# Patient Record
Sex: Male | Born: 1940 | Race: White | Hispanic: No | Marital: Single | Smoking: Never smoker
Health system: Southern US, Community
[De-identification: ages and names within clinical notes are randomized; demographics above are authoritative.]

## PROBLEM LIST (undated history)

## (undated) DIAGNOSIS — I2721 Secondary pulmonary arterial hypertension: Secondary | ICD-10-CM

## (undated) DIAGNOSIS — I10 Essential (primary) hypertension: Secondary | ICD-10-CM

## (undated) DIAGNOSIS — I4821 Permanent atrial fibrillation: Secondary | ICD-10-CM

## (undated) DIAGNOSIS — I509 Heart failure, unspecified: Secondary | ICD-10-CM

## (undated) DIAGNOSIS — I2510 Atherosclerotic heart disease of native coronary artery without angina pectoris: Secondary | ICD-10-CM

## (undated) DIAGNOSIS — I5030 Unspecified diastolic (congestive) heart failure: Secondary | ICD-10-CM

## (undated) DIAGNOSIS — J90 Pleural effusion, not elsewhere classified: Secondary | ICD-10-CM

## (undated) DIAGNOSIS — I4891 Unspecified atrial fibrillation: Secondary | ICD-10-CM

## (undated) DIAGNOSIS — E785 Hyperlipidemia, unspecified: Secondary | ICD-10-CM

## (undated) HISTORY — DX: Heart failure, unspecified: I50.9

## (undated) HISTORY — DX: Permanent atrial fibrillation: I48.21

## (undated) HISTORY — DX: Hyperlipidemia, unspecified: E78.5

## (undated) HISTORY — PX: CARDIAC CATHETERIZATION: SHX172

## (undated) HISTORY — DX: Unspecified diastolic (congestive) heart failure: I50.30

## (undated) HISTORY — PX: CHOLECYSTECTOMY: SHX55

## (undated) HISTORY — DX: Secondary pulmonary arterial hypertension: I27.21

## (undated) HISTORY — DX: Pleural effusion, not elsewhere classified: J90

## (undated) HISTORY — DX: Atherosclerotic heart disease of native coronary artery without angina pectoris: I25.10

## (undated) HISTORY — DX: Unspecified atrial fibrillation: I48.91

## (undated) HISTORY — DX: Essential (primary) hypertension: I10

---

## 2004-04-24 HISTORY — PX: CORONARY ARTERY BYPASS GRAFT: SHX141

## 2004-06-10 HISTORY — PX: CORONARY ANGIOPLASTY: SHX604

## 2013-06-06 ENCOUNTER — Inpatient Hospital Stay: Payer: Self-pay | Admitting: Specialist

## 2013-06-06 DIAGNOSIS — Z8249 Family history of ischemic heart disease and other diseases of the circulatory system: Secondary | ICD-10-CM | POA: Diagnosis not present

## 2013-06-06 DIAGNOSIS — Z951 Presence of aortocoronary bypass graft: Secondary | ICD-10-CM | POA: Diagnosis not present

## 2013-06-06 DIAGNOSIS — I10 Essential (primary) hypertension: Secondary | ICD-10-CM | POA: Diagnosis present

## 2013-06-06 DIAGNOSIS — D72829 Elevated white blood cell count, unspecified: Secondary | ICD-10-CM | POA: Diagnosis not present

## 2013-06-06 DIAGNOSIS — K8021 Calculus of gallbladder without cholecystitis with obstruction: Secondary | ICD-10-CM | POA: Diagnosis not present

## 2013-06-06 DIAGNOSIS — I4891 Unspecified atrial fibrillation: Secondary | ICD-10-CM | POA: Diagnosis not present

## 2013-06-06 DIAGNOSIS — Z882 Allergy status to sulfonamides status: Secondary | ICD-10-CM | POA: Diagnosis not present

## 2013-06-06 DIAGNOSIS — R7989 Other specified abnormal findings of blood chemistry: Secondary | ICD-10-CM | POA: Diagnosis not present

## 2013-06-06 DIAGNOSIS — Z808 Family history of malignant neoplasm of other organs or systems: Secondary | ICD-10-CM | POA: Diagnosis not present

## 2013-06-06 DIAGNOSIS — E876 Hypokalemia: Secondary | ICD-10-CM | POA: Diagnosis present

## 2013-06-06 DIAGNOSIS — I2510 Atherosclerotic heart disease of native coronary artery without angina pectoris: Secondary | ICD-10-CM | POA: Diagnosis not present

## 2013-06-06 DIAGNOSIS — E785 Hyperlipidemia, unspecified: Secondary | ICD-10-CM | POA: Diagnosis present

## 2013-06-06 DIAGNOSIS — K8020 Calculus of gallbladder without cholecystitis without obstruction: Secondary | ICD-10-CM | POA: Diagnosis not present

## 2013-06-06 DIAGNOSIS — I369 Nonrheumatic tricuspid valve disorder, unspecified: Secondary | ICD-10-CM

## 2013-06-06 DIAGNOSIS — R0602 Shortness of breath: Secondary | ICD-10-CM | POA: Diagnosis not present

## 2013-06-06 DIAGNOSIS — K810 Acute cholecystitis: Secondary | ICD-10-CM | POA: Diagnosis not present

## 2013-06-06 DIAGNOSIS — R079 Chest pain, unspecified: Secondary | ICD-10-CM | POA: Diagnosis not present

## 2013-06-06 DIAGNOSIS — I200 Unstable angina: Secondary | ICD-10-CM | POA: Diagnosis not present

## 2013-06-06 DIAGNOSIS — R0789 Other chest pain: Secondary | ICD-10-CM | POA: Diagnosis not present

## 2013-06-06 DIAGNOSIS — J189 Pneumonia, unspecified organism: Secondary | ICD-10-CM | POA: Diagnosis not present

## 2013-06-06 DIAGNOSIS — Z9861 Coronary angioplasty status: Secondary | ICD-10-CM | POA: Diagnosis not present

## 2013-06-06 DIAGNOSIS — I259 Chronic ischemic heart disease, unspecified: Secondary | ICD-10-CM | POA: Diagnosis not present

## 2013-06-06 DIAGNOSIS — R509 Fever, unspecified: Secondary | ICD-10-CM | POA: Diagnosis not present

## 2013-06-06 DIAGNOSIS — B351 Tinea unguium: Secondary | ICD-10-CM | POA: Diagnosis present

## 2013-06-06 DIAGNOSIS — K8050 Calculus of bile duct without cholangitis or cholecystitis without obstruction: Secondary | ICD-10-CM | POA: Diagnosis not present

## 2013-06-06 DIAGNOSIS — K859 Acute pancreatitis without necrosis or infection, unspecified: Secondary | ICD-10-CM | POA: Diagnosis not present

## 2013-06-06 DIAGNOSIS — R109 Unspecified abdominal pain: Secondary | ICD-10-CM | POA: Diagnosis not present

## 2013-06-06 DIAGNOSIS — R1011 Right upper quadrant pain: Secondary | ICD-10-CM | POA: Diagnosis not present

## 2013-06-06 LAB — CBC
HCT: 45.1 % (ref 40.0–52.0)
HGB: 15.7 g/dL (ref 13.0–18.0)
MCH: 34.5 pg — ABNORMAL HIGH (ref 26.0–34.0)
MCV: 100 fL (ref 80–100)
Platelet: 161 10*3/uL (ref 150–440)
RBC: 4.54 10*6/uL (ref 4.40–5.90)
RDW: 13.8 % (ref 11.5–14.5)
WBC: 25.6 10*3/uL — ABNORMAL HIGH (ref 3.8–10.6)

## 2013-06-06 LAB — URINALYSIS, COMPLETE
Blood: NEGATIVE
Hyaline Cast: 3
Leukocyte Esterase: NEGATIVE
Nitrite: NEGATIVE
Protein: 100
Specific Gravity: 1.021 (ref 1.003–1.030)
Squamous Epithelial: <1
WBC UR: 2 /HPF (ref 0–5)

## 2013-06-06 LAB — CK TOTAL AND CKMB (NOT AT ARMC): CK, Total: 96 U/L (ref 35–232)

## 2013-06-06 LAB — COMPREHENSIVE METABOLIC PANEL
Albumin: 3.6 g/dL (ref 3.4–5.0)
Anion Gap: 7 (ref 7–16)
Bilirubin,Total: 2.7 mg/dL — ABNORMAL HIGH (ref 0.2–1.0)
Calcium, Total: 8.9 mg/dL (ref 8.5–10.1)
Chloride: 98 mmol/L (ref 98–107)
Co2: 30 mmol/L (ref 21–32)
EGFR (African American): >60
SGOT(AST): 79 U/L — ABNORMAL HIGH (ref 15–37)
Total Protein: 7.6 g/dL (ref 6.4–8.2)

## 2013-06-06 LAB — LIPID PANEL
Cholesterol: 150 mg/dL (ref 0–200)
Ldl Cholesterol, Calc: 90 mg/dL (ref 0–100)
Triglycerides: 85 mg/dL (ref 0–200)

## 2013-06-06 LAB — TROPONIN I: Troponin-I: <0.02 ng/mL

## 2013-06-06 LAB — PROTIME-INR
INR: 1
Prothrombin Time: 13.3 secs (ref 11.5–14.7)

## 2013-06-07 LAB — CBC WITH DIFFERENTIAL/PLATELET
Basophil #: 0 10*3/uL (ref 0.0–0.1)
Eosinophil #: 0.1 10*3/uL (ref 0.0–0.7)
Eosinophil %: 0.2 %
HCT: 43.3 % (ref 40.0–52.0)
Lymphocyte #: 1.8 10*3/uL (ref 1.0–3.6)
MCHC: 33.8 g/dL (ref 32.0–36.0)
Monocyte %: 5.3 %
Neutrophil %: 88.1 %
WBC: 29 10*3/uL — ABNORMAL HIGH (ref 3.8–10.6)

## 2013-06-07 LAB — MAGNESIUM: Magnesium: 1.7 mg/dL — ABNORMAL LOW

## 2013-06-07 LAB — COMPREHENSIVE METABOLIC PANEL
BUN: 17 mg/dL (ref 7–18)
Bilirubin,Total: 2.6 mg/dL — ABNORMAL HIGH (ref 0.2–1.0)
Co2: 30 mmol/L (ref 21–32)
EGFR (African American): >60
EGFR (Non-African Amer.): >60
Osmolality: 272 (ref 275–301)
Potassium: 3 mmol/L — ABNORMAL LOW (ref 3.5–5.1)

## 2013-06-07 LAB — CK TOTAL AND CKMB (NOT AT ARMC): CK, Total: 63 U/L (ref 35–232)

## 2013-06-07 LAB — TROPONIN I: Troponin-I: <0.02 ng/mL

## 2013-06-08 DIAGNOSIS — I4891 Unspecified atrial fibrillation: Secondary | ICD-10-CM

## 2013-06-08 LAB — BASIC METABOLIC PANEL
Anion Gap: 8 (ref 7–16)
Calcium, Total: 8.4 mg/dL — ABNORMAL LOW (ref 8.5–10.1)
Chloride: 99 mmol/L (ref 98–107)
Co2: 27 mmol/L (ref 21–32)
Creatinine: 1.12 mg/dL (ref 0.60–1.30)
EGFR (African American): >60
EGFR (Non-African Amer.): >60
Glucose: 145 mg/dL — ABNORMAL HIGH (ref 65–99)
Osmolality: 274 (ref 275–301)
Sodium: 134 mmol/L — ABNORMAL LOW (ref 136–145)

## 2013-06-08 LAB — CBC WITH DIFFERENTIAL/PLATELET
Eosinophil #: 0 10*3/uL (ref 0.0–0.7)
Eosinophil %: 0.1 %
HCT: 38.4 % — ABNORMAL LOW (ref 40.0–52.0)
Lymphocyte #: 1.2 10*3/uL (ref 1.0–3.6)
MCH: 33.9 pg (ref 26.0–34.0)
Monocyte %: 5.7 %
Neutrophil #: 18.4 10*3/uL — ABNORMAL HIGH (ref 1.4–6.5)
Neutrophil %: 88.4 %
RBC: 3.83 10*6/uL — ABNORMAL LOW (ref 4.40–5.90)
WBC: 20.9 10*3/uL — ABNORMAL HIGH (ref 3.8–10.6)

## 2013-06-09 DIAGNOSIS — R079 Chest pain, unspecified: Secondary | ICD-10-CM

## 2013-06-09 LAB — COMPREHENSIVE METABOLIC PANEL
Albumin: 2.6 g/dL — ABNORMAL LOW (ref 3.4–5.0)
Bilirubin,Total: 1.4 mg/dL — ABNORMAL HIGH (ref 0.2–1.0)
Co2: 31 mmol/L (ref 21–32)
EGFR (African American): >60
EGFR (Non-African Amer.): 58 — ABNORMAL LOW
Glucose: 141 mg/dL — ABNORMAL HIGH (ref 65–99)
SGOT(AST): 19 U/L (ref 15–37)
SGPT (ALT): 51 U/L (ref 12–78)
Sodium: 135 mmol/L — ABNORMAL LOW (ref 136–145)
Total Protein: 6.7 g/dL (ref 6.4–8.2)

## 2013-06-09 LAB — CBC WITH DIFFERENTIAL/PLATELET
Basophil #: 0 10*3/uL (ref 0.0–0.1)
Basophil %: 0.1 %
Eosinophil #: 0 10*3/uL (ref 0.0–0.7)
HCT: 37.1 % — ABNORMAL LOW (ref 40.0–52.0)
HGB: 12.8 g/dL — ABNORMAL LOW (ref 13.0–18.0)
MCHC: 34.5 g/dL (ref 32.0–36.0)
MCV: 100 fL (ref 80–100)
Monocyte #: 1.1 x10 3/mm — ABNORMAL HIGH (ref 0.2–1.0)
Monocyte %: 7.2 %
Neutrophil #: 13.3 10*3/uL — ABNORMAL HIGH (ref 1.4–6.5)
RBC: 3.71 10*6/uL — ABNORMAL LOW (ref 4.40–5.90)

## 2013-06-10 LAB — BASIC METABOLIC PANEL
BUN: 19 mg/dL — ABNORMAL HIGH (ref 7–18)
Calcium, Total: 8.8 mg/dL (ref 8.5–10.1)
Co2: 29 mmol/L (ref 21–32)
EGFR (African American): >60
EGFR (Non-African Amer.): 58 — ABNORMAL LOW
Glucose: 129 mg/dL — ABNORMAL HIGH (ref 65–99)
Osmolality: 278 (ref 275–301)
Sodium: 137 mmol/L (ref 136–145)

## 2013-06-10 LAB — CBC WITH DIFFERENTIAL/PLATELET
Basophil #: 0 10*3/uL (ref 0.0–0.1)
Basophil %: 0.2 %
Eosinophil #: 0.1 10*3/uL (ref 0.0–0.7)
HCT: 36.4 % — ABNORMAL LOW (ref 40.0–52.0)
Lymphocyte %: 8.3 %
MCHC: 34.6 g/dL (ref 32.0–36.0)
MCV: 100 fL (ref 80–100)
Monocyte %: 8.1 %
Neutrophil #: 13.5 10*3/uL — ABNORMAL HIGH (ref 1.4–6.5)
Platelet: 148 10*3/uL — ABNORMAL LOW (ref 150–440)
RDW: 13.3 % (ref 11.5–14.5)
WBC: 16.3 10*3/uL — ABNORMAL HIGH (ref 3.8–10.6)

## 2013-06-11 LAB — CBC WITH DIFFERENTIAL/PLATELET
Basophil #: 0 10*3/uL (ref 0.0–0.1)
Eosinophil #: 0.3 10*3/uL (ref 0.0–0.7)
Eosinophil %: 1.5 %
HCT: 35.7 % — ABNORMAL LOW (ref 40.0–52.0)
HGB: 12.3 g/dL — ABNORMAL LOW (ref 13.0–18.0)
MCH: 34.6 pg — ABNORMAL HIGH (ref 26.0–34.0)
MCV: 100 fL (ref 80–100)
Monocyte #: 1.2 x10 3/mm — ABNORMAL HIGH (ref 0.2–1.0)
Monocyte %: 7.3 %
RBC: 3.56 10*6/uL — ABNORMAL LOW (ref 4.40–5.90)

## 2013-06-11 LAB — RAPID HIV-1/2 QL/CONFIRM: HIV-1/2,Rapid Ql: NEGATIVE

## 2013-06-11 LAB — LIPASE, BLOOD: Lipase: 879 U/L — ABNORMAL HIGH (ref 73–393)

## 2013-06-12 LAB — CBC WITH DIFFERENTIAL/PLATELET
Basophil %: 0.1 %
Eosinophil #: 0 10*3/uL (ref 0.0–0.7)
HGB: 13.2 g/dL (ref 13.0–18.0)
Lymphocyte #: 1 10*3/uL (ref 1.0–3.6)
Lymphocyte %: 4.6 %
MCH: 35.3 pg — ABNORMAL HIGH (ref 26.0–34.0)
MCHC: 35.8 g/dL (ref 32.0–36.0)
MCV: 99 fL (ref 80–100)
Neutrophil #: 18.8 10*3/uL — ABNORMAL HIGH (ref 1.4–6.5)
Neutrophil %: 88.5 %
Platelet: 216 10*3/uL (ref 150–440)
RBC: 3.73 10*6/uL — ABNORMAL LOW (ref 4.40–5.90)
WBC: 21.2 10*3/uL — ABNORMAL HIGH (ref 3.8–10.6)

## 2013-06-12 LAB — CULTURE, BLOOD (SINGLE)

## 2013-06-17 ENCOUNTER — Telehealth: Payer: Self-pay

## 2013-06-17 NOTE — Telephone Encounter (Signed)
Patient contacted regarding discharge from [facility] on 06/12/13.  Patient understands to follow up with provider Dr. Mariah Milling on 06/23/13 at 11:15 at [facility]. Patient understands discharge instructions? yes Patient understands medications and regiment? yes Patient understands to bring all medications to this visit? yes

## 2013-06-23 ENCOUNTER — Ambulatory Visit (INDEPENDENT_AMBULATORY_CARE_PROVIDER_SITE_OTHER): Payer: Medicare Other | Admitting: Cardiovascular Disease

## 2013-06-23 ENCOUNTER — Encounter: Payer: Self-pay | Admitting: Cardiovascular Disease

## 2013-06-23 VITALS — BP 90/58 | HR 72 | Ht 71.0 in | Wt 186.5 lb

## 2013-06-23 DIAGNOSIS — I2510 Atherosclerotic heart disease of native coronary artery without angina pectoris: Secondary | ICD-10-CM | POA: Insufficient documentation

## 2013-06-23 DIAGNOSIS — I4891 Unspecified atrial fibrillation: Secondary | ICD-10-CM | POA: Insufficient documentation

## 2013-06-23 DIAGNOSIS — K859 Acute pancreatitis without necrosis or infection, unspecified: Secondary | ICD-10-CM | POA: Diagnosis not present

## 2013-06-23 DIAGNOSIS — E785 Hyperlipidemia, unspecified: Secondary | ICD-10-CM

## 2013-06-23 DIAGNOSIS — Z951 Presence of aortocoronary bypass graft: Secondary | ICD-10-CM | POA: Diagnosis not present

## 2013-06-23 DIAGNOSIS — I10 Essential (primary) hypertension: Secondary | ICD-10-CM | POA: Insufficient documentation

## 2013-06-23 DIAGNOSIS — K851 Biliary acute pancreatitis without necrosis or infection: Secondary | ICD-10-CM | POA: Insufficient documentation

## 2013-06-23 MED ORDER — RIVAROXABAN 20 MG PO TABS: 20.0000 mg | ORAL_TABLET | Freq: Every day | ORAL | Status: DC

## 2013-06-23 NOTE — Assessment & Plan Note (Addendum)
Heart rate is reasonably well controlled. We have suggested he stay on his metoprolol and anticoagulation, xarelto. If he requires laparoscopic cholecystectomy, xarelto could be held 2 or 3 days prior to the surgery with restart of the anticoagulation 48 hours after the procedure. After his procedure and followup, we can discuss whether he would like to proceed with cardioversion for his atrial fibrillation.

## 2013-06-23 NOTE — Assessment & Plan Note (Signed)
Acceptable risk for laparoscopic cholecystectomy. Recent negative stress test. No further testing needed. Would hold anticoagulation 2 or 3 days prior to the procedure, restart 48 hours after the procedure.

## 2013-06-23 NOTE — Patient Instructions (Addendum)
You are doing well. Please hold the HCTZ (hydrocholorthiazide) Take this pill only for leg selling/edema or shortness of breath  Please continue the aspirin 81 mg daily with xarelto one a day  Please call us if you have new issues that need to be addressed before your next appt.  Your physician wants you to follow-up in: 3 months.  You will receive a reminder letter in the mail two months in advance. If you don't receive a letter, please call our office to schedule the follow-up appointment.

## 2013-06-23 NOTE — Progress Notes (Signed)
Patient ID: Jordan Perkins, male    DOB: [birth year], 72 y.o.   MRN: [id number]  HPI Comments: Jordan Perkins is a pleasant 72 year old gentleman with recent hospital admission at the end of September 2014, past medical history of CAD, bypass surgery, stents in 2005, hypertension, hyperlipidemia, atrial fibrillation who presented to the hospital with shortness of breath and chest pain, rapid atrial fibrillation. Heart rate was initially 140s Jordan Perkins had CABG in 2005 at [facility] with stents placed 2 months later  On his recent hospital admission at [facility] Jordan Perkins was noted to have gallstones. Jordan Perkins had a negative Hida scan. Jordan Perkins has followup with general surgery for possible cholecystectomy.  Jordan Perkins had fever of uncertain etiology as high as 102 while in the hospital. Jordan Perkins was initially treated with antibiotics, discharged on Levaquin and Flagyl for possible pneumonia or biliary pathology. White blood cell count was 21,000 His cardiac enzymes were negative through his hospital course. Stress test was performed that showed no ischemia Echocardiogram showed ejection fraction 60-65%, mild to moderate TR, moderately elevated right ventricular systolic pressures Jordan Perkins was started on anticoagulation, xarelto 20 mg daily for his atrial fibrillation. Jordan Perkins was unable to drive frequently and it was felt Jordan Perkins could not make his INR appointments  CT scan in the hospital of his abdomen and pelvis showed acute pancreatitis with increased fluid density in the peri-pancreatic fat, multiple gallstones, small hiatal hernia in followup today, Jordan Perkins feels well with no complaints. Jordan Perkins reports anorexia. Slowly losing weight as Jordan Perkins is not hungry. Vague discomfort in his  Abdomen.  EKG shows atrial fibrillation with rate 72 beats per minute, no significant ST or T wave changes  Blood pressure in the office today is low, 90s systolic. Repeat 100 systolic. Jordan Perkins is relatively asymptomatic   Outpatient Encounter Prescriptions as of 06/23/2013  Medication Sig  Dispense Refill  . aspirin 81 MG tablet Take 81 mg by mouth daily.      Marland Kitchen atorvastatin (LIPITOR) 80 MG tablet Take 80 mg by mouth daily.      . enalapril (VASOTEC) 20 MG tablet Take 20 mg by mouth daily.      . hydrochlorothiazide (HYDRODIURIL) 25 MG tablet Take 25 mg by mouth daily.      . metoprolol (LOPRESSOR) 100 MG tablet Take 100 mg by mouth 2 (two) times daily.      . Rivaroxaban (XARELTO) 20 MG TABS tablet Take 1 tablet (20 mg total) by mouth daily.  30 tablet  6  . [DISCONTINUED] Rivaroxaban (XARELTO) 20 MG TABS tablet Take by mouth daily.       No facility-administered encounter medications on file as of 06/23/2013.     Review of Systems  Constitutional: Negative.   HENT: Negative.   Eyes: Negative.   Respiratory: Negative.   Cardiovascular: Negative.   Gastrointestinal: Negative.   Endocrine: Negative.   Musculoskeletal: Negative.   Skin: Negative.   Allergic/Immunologic: Negative.   Neurological: Negative.   Hematological: Negative.   Psychiatric/Behavioral: Negative.   All other systems reviewed and are negative.    BP 90/58  Pulse 72  Ht 5\' 11"  (1.803 m)  Wt 186 lb 8 oz (84.596 kg)  BMI 26.02 kg/m2 Repeat blood pressure 100 systolic  Physical Exam  Nursing note and vitals reviewed. Constitutional: Jordan Perkins is oriented to person, place, and time. Jordan Perkins appears well-developed and well-nourished.  HENT:  Head: Normocephalic.  Nose: Nose normal.  Mouth/Throat: Oropharynx is clear and moist.  Eyes: Conjunctivae are normal. Pupils are equal, round,  and reactive to light.  Neck: Normal range of motion. Neck supple. No JVD present.  Cardiovascular: Normal rate, regular rhythm, S1 normal, S2 normal, normal heart sounds and intact distal pulses.  Exam reveals no gallop and no friction rub.   No murmur heard. Pulmonary/Chest: Effort normal and breath sounds normal. No respiratory distress. Jordan Perkins has no wheezes. Jordan Perkins has no rales. Jordan Perkins exhibits no tenderness.  Abdominal: Soft.  Bowel sounds are normal. Jordan Perkins exhibits no distension. There is no tenderness.  Musculoskeletal: Normal range of motion. Jordan Perkins exhibits no edema and no tenderness.  Lymphadenopathy:    Jordan Perkins has no cervical adenopathy.  Neurological: Jordan Perkins is alert and oriented to person, place, and time. Coordination normal.  Skin: Skin is warm and dry. No rash noted. No erythema.  Psychiatric: Jordan Perkins has a normal mood and affect. His behavior is normal. Judgment and thought content normal.      Assessment and Plan

## 2013-06-23 NOTE — Assessment & Plan Note (Signed)
We have suggested that he stay on his Lipitor

## 2013-06-23 NOTE — Assessment & Plan Note (Signed)
Blood pressure is low today. We have suggested he hold his HCTZ and take this only as needed for leg edema

## 2013-06-23 NOTE — Assessment & Plan Note (Signed)
Recent stress test on 06/08/2013 showing no ischemia. No further workup needed at this time. We'll continue him on his current medications

## 2013-07-01 ENCOUNTER — Telehealth: Payer: Self-pay

## 2013-07-01 DIAGNOSIS — K859 Acute pancreatitis without necrosis or infection, unspecified: Secondary | ICD-10-CM | POA: Diagnosis not present

## 2013-07-01 DIAGNOSIS — K8020 Calculus of gallbladder without cholecystitis without obstruction: Secondary | ICD-10-CM | POA: Diagnosis not present

## 2013-07-01 NOTE — Telephone Encounter (Signed)
Surgery scheduled for 07/13/2013 for Gallbladder removal. Is it ok to stop Xarelto and how long. Please advise in writing. Fax [phone]

## 2013-07-01 NOTE — Telephone Encounter (Signed)
Will fax last office note w/ cardiac clearance to [facility] at [phone].

## 2013-07-03 ENCOUNTER — Encounter: Payer: Self-pay | Admitting: *Deleted

## 2013-07-07 ENCOUNTER — Ambulatory Visit: Payer: Self-pay | Admitting: Surgery

## 2013-07-07 DIAGNOSIS — Z79899 Other long term (current) drug therapy: Secondary | ICD-10-CM | POA: Diagnosis not present

## 2013-07-07 DIAGNOSIS — Z01812 Encounter for preprocedural laboratory examination: Secondary | ICD-10-CM | POA: Diagnosis not present

## 2013-07-07 DIAGNOSIS — I4891 Unspecified atrial fibrillation: Secondary | ICD-10-CM | POA: Diagnosis not present

## 2013-07-07 DIAGNOSIS — I10 Essential (primary) hypertension: Secondary | ICD-10-CM | POA: Diagnosis not present

## 2013-07-07 DIAGNOSIS — K859 Acute pancreatitis without necrosis or infection, unspecified: Secondary | ICD-10-CM | POA: Diagnosis not present

## 2013-07-07 DIAGNOSIS — Z882 Allergy status to sulfonamides status: Secondary | ICD-10-CM | POA: Diagnosis not present

## 2013-07-07 DIAGNOSIS — K8000 Calculus of gallbladder with acute cholecystitis without obstruction: Secondary | ICD-10-CM | POA: Diagnosis not present

## 2013-07-07 LAB — BASIC METABOLIC PANEL
Anion Gap: 2 — ABNORMAL LOW (ref 7–16)
Calcium, Total: 9.6 mg/dL (ref 8.5–10.1)
Chloride: 105 mmol/L (ref 98–107)
Co2: 29 mmol/L (ref 21–32)
EGFR (African American): >60
EGFR (Non-African Amer.): >60
Osmolality: 275 (ref 275–301)
Sodium: 136 mmol/L (ref 136–145)

## 2013-07-07 LAB — CBC WITH DIFFERENTIAL/PLATELET
Eosinophil #: 0.2 10*3/uL (ref 0.0–0.7)
Eosinophil %: 2.4 %
HCT: 38.5 % — ABNORMAL LOW (ref 40.0–52.0)
HGB: 13.5 g/dL (ref 13.0–18.0)
Lymphocyte #: 1.6 10*3/uL (ref 1.0–3.6)
MCH: 34.4 pg — ABNORMAL HIGH (ref 26.0–34.0)
MCV: 98 fL (ref 80–100)
Monocyte #: 0.5 x10 3/mm (ref 0.2–1.0)
Neutrophil %: 70.1 %
Platelet: 142 10*3/uL — ABNORMAL LOW (ref 150–440)
RDW: 13.7 % (ref 11.5–14.5)

## 2013-07-07 LAB — LIPASE, BLOOD: Lipase: 580 U/L — ABNORMAL HIGH (ref 73–393)

## 2013-07-13 ENCOUNTER — Encounter: Payer: Self-pay | Admitting: Cardiovascular Disease

## 2013-07-13 ENCOUNTER — Ambulatory Visit: Payer: Self-pay | Admitting: Surgery

## 2013-07-13 DIAGNOSIS — I2510 Atherosclerotic heart disease of native coronary artery without angina pectoris: Secondary | ICD-10-CM | POA: Diagnosis not present

## 2013-07-13 DIAGNOSIS — K8010 Calculus of gallbladder with chronic cholecystitis without obstruction: Secondary | ICD-10-CM | POA: Diagnosis not present

## 2013-07-13 DIAGNOSIS — K8000 Calculus of gallbladder with acute cholecystitis without obstruction: Secondary | ICD-10-CM | POA: Diagnosis not present

## 2013-07-13 DIAGNOSIS — I10 Essential (primary) hypertension: Secondary | ICD-10-CM | POA: Diagnosis not present

## 2013-07-13 DIAGNOSIS — Z9861 Coronary angioplasty status: Secondary | ICD-10-CM | POA: Diagnosis not present

## 2013-07-13 DIAGNOSIS — Z882 Allergy status to sulfonamides status: Secondary | ICD-10-CM | POA: Diagnosis not present

## 2013-07-13 DIAGNOSIS — E785 Hyperlipidemia, unspecified: Secondary | ICD-10-CM | POA: Diagnosis not present

## 2013-07-13 DIAGNOSIS — I4892 Unspecified atrial flutter: Secondary | ICD-10-CM | POA: Diagnosis not present

## 2013-07-13 DIAGNOSIS — Z808 Family history of malignant neoplasm of other organs or systems: Secondary | ICD-10-CM | POA: Diagnosis not present

## 2013-07-13 DIAGNOSIS — Z8489 Family history of other specified conditions: Secondary | ICD-10-CM | POA: Diagnosis not present

## 2013-07-13 DIAGNOSIS — I4891 Unspecified atrial fibrillation: Secondary | ICD-10-CM | POA: Diagnosis not present

## 2013-07-13 DIAGNOSIS — Z8249 Family history of ischemic heart disease and other diseases of the circulatory system: Secondary | ICD-10-CM | POA: Diagnosis not present

## 2013-07-13 DIAGNOSIS — K8020 Calculus of gallbladder without cholecystitis without obstruction: Secondary | ICD-10-CM | POA: Diagnosis not present

## 2013-07-13 DIAGNOSIS — Z7902 Long term (current) use of antithrombotics/antiplatelets: Secondary | ICD-10-CM | POA: Diagnosis not present

## 2013-07-13 DIAGNOSIS — K859 Acute pancreatitis without necrosis or infection, unspecified: Secondary | ICD-10-CM | POA: Diagnosis not present

## 2013-07-13 DIAGNOSIS — Z951 Presence of aortocoronary bypass graft: Secondary | ICD-10-CM | POA: Diagnosis not present

## 2013-07-13 DIAGNOSIS — Z79899 Other long term (current) drug therapy: Secondary | ICD-10-CM | POA: Diagnosis not present

## 2013-07-14 LAB — PATHOLOGY REPORT

## 2013-09-23 ENCOUNTER — Ambulatory Visit: Payer: Medicare Other | Admitting: Cardiovascular Disease

## 2013-10-08 ENCOUNTER — Ambulatory Visit: Payer: Medicare Other | Admitting: Cardiovascular Disease

## 2013-10-08 ENCOUNTER — Encounter: Payer: Self-pay | Admitting: *Deleted

## 2013-11-17 ENCOUNTER — Other Ambulatory Visit: Payer: Self-pay | Admitting: *Deleted

## 2013-11-17 ENCOUNTER — Encounter: Payer: Self-pay | Admitting: Cardiovascular Disease

## 2013-11-17 ENCOUNTER — Ambulatory Visit (INDEPENDENT_AMBULATORY_CARE_PROVIDER_SITE_OTHER): Payer: Medicare Other | Admitting: Cardiovascular Disease

## 2013-11-17 VITALS — BP 160/98 | HR 100 | Ht 71.0 in | Wt 199.5 lb

## 2013-11-17 DIAGNOSIS — I4891 Unspecified atrial fibrillation: Secondary | ICD-10-CM

## 2013-11-17 DIAGNOSIS — Z951 Presence of aortocoronary bypass graft: Secondary | ICD-10-CM

## 2013-11-17 DIAGNOSIS — E785 Hyperlipidemia, unspecified: Secondary | ICD-10-CM

## 2013-11-17 DIAGNOSIS — I2510 Atherosclerotic heart disease of native coronary artery without angina pectoris: Secondary | ICD-10-CM | POA: Diagnosis not present

## 2013-11-17 DIAGNOSIS — I10 Essential (primary) hypertension: Secondary | ICD-10-CM

## 2013-11-17 MED ORDER — RIVAROXABAN 20 MG PO TABS: 20.0000 mg | ORAL_TABLET | Freq: Every day | ORAL | Status: DC

## 2013-11-17 MED ORDER — ATORVASTATIN CALCIUM 80 MG PO TABS: 80.0000 mg | ORAL_TABLET | Freq: Every day | ORAL | Status: DC

## 2013-11-17 MED ORDER — ENALAPRIL MALEATE 20 MG PO TABS: 20.0000 mg | ORAL_TABLET | Freq: Every day | ORAL | Status: DC

## 2013-11-17 MED ORDER — HYDROCHLOROTHIAZIDE 25 MG PO TABS: 25.0000 mg | ORAL_TABLET | Freq: Every day | ORAL | Status: DC

## 2013-11-17 MED ORDER — METOPROLOL TARTRATE 100 MG PO TABS: 100.0000 mg | ORAL_TABLET | Freq: Two times a day (BID) | ORAL | Status: DC

## 2013-11-17 NOTE — Telephone Encounter (Signed)
Requested Prescriptions   Signed Prescriptions Disp Refills  . atorvastatin (LIPITOR) 80 MG tablet 30 tablet 3    Sig: Take 1 tablet (80 mg total) by mouth daily.    Authorizing Provider: [name]    Ordering User: Eugenio Hoes, Keyanah Kozicki C  . enalapril (VASOTEC) 20 MG tablet 30 tablet 3    Sig: Take 1 tablet (20 mg total) by mouth daily.    Authorizing Provider: [name]    Ordering User: Britt Bottom hydrochlorothiazide (HYDRODIURIL) 25 MG tablet 30 tablet 3    Sig: Take 1 tablet (25 mg total) by mouth daily.    Authorizing Provider: [name]    Ordering User: Eugenio Hoes, Hai Grabe C  . metoprolol (LOPRESSOR) 100 MG tablet 60 tablet 3    Sig: Take 1 tablet (100 mg total) by mouth 2 (two) times daily.    Authorizing Provider: [name]    Ordering User: Britt Bottom Rivaroxaban (XARELTO) 20 MG TABS tablet 30 tablet 3    Sig: Take 1 tablet (20 mg total) by mouth daily.    Authorizing Provider: [name]    Ordering User: Britt Bottom

## 2013-11-17 NOTE — Progress Notes (Signed)
Patient ID: Jordan Perkins, male    DOB: 12-10-1940, 74 y.o.   MRN: [id number]  HPI Comments: Mr. Hildreth is a pleasant 73 year old gentleman with recent hospital admission at the end of September 2014, past medical history of CAD, bypass surgery, stents in 2005, hypertension, hyperlipidemia, atrial fibrillation who presented to the hospital with shortness of breath and chest pain, rapid atrial fibrillation. Heart rate was initially 140s He had CABG in 2005 at [facility] with stents placed 2 months later   hospital admission at [facility] he was noted to have gallstones. He had a negative Hida scan. Now s/p  Cholecystectomy 11/14 Stress test was performed that showed no ischemia Echocardiogram showed ejection fraction 60-65%, mild to moderate TR, moderately elevated right ventricular systolic pressures He was started on anticoagulation, xarelto 20 mg daily for his atrial fibrillation. He was unable to drive frequently and it was felt he could not make his INR appointments  In followup today, he reports that he is doing well. He denies any chest pain or shortness of breath with exertion. He does not appreciate his atrial fibrillation.  CT scan in the hospital of his abdomen and pelvis showed acute pancreatitis with increased fluid density in the peri-pancreatic fat, multiple gallstones, small hiatal hernia in followup today, he feels well with no complaints. He reports anorexia. Slowly losing weight as he is not hungry. Vague discomfort in his  Abdomen.  EKG shows atrial fibrillation with rate 100 beats per minute, no significant ST or T wave changes   Outpatient Encounter Prescriptions as of 11/17/2013  Medication Sig  . aspirin 81 MG tablet Take 81 mg by mouth daily.  Marland Kitchen atorvastatin (LIPITOR) 80 MG tablet Take 80 mg by mouth daily.  . enalapril (VASOTEC) 20 MG tablet Take 20 mg by mouth daily.  . hydrochlorothiazide (HYDRODIURIL) 25 MG tablet Take 25 mg by mouth daily.  . metoprolol (LOPRESSOR) 100  MG tablet Take 100 mg by mouth 2 (two) times daily.  . Rivaroxaban (XARELTO) 20 MG TABS tablet Take 1 tablet (20 mg total) by mouth daily.     Review of Systems  Constitutional: Negative.   HENT: Negative.   Eyes: Negative.   Respiratory: Negative.   Cardiovascular: Negative.   Gastrointestinal: Negative.   Endocrine: Negative.   Musculoskeletal: Negative.   Skin: Negative.   Allergic/Immunologic: Negative.   Neurological: Negative.   Hematological: Negative.   Psychiatric/Behavioral: Negative.   All other systems reviewed and are negative.    BP 160/98  Pulse 100  Ht 5\' 11"  (1.803 m)  Wt 199 lb 8 oz (90.493 kg)  BMI 27.84 kg/m2  Physical Exam  Nursing note and vitals reviewed. Constitutional: He is oriented to person, place, and time. He appears well-developed and well-nourished.  HENT:  Head: Normocephalic.  Nose: Nose normal.  Mouth/Throat: Oropharynx is clear and moist.  Eyes: Conjunctivae are normal. Pupils are equal, round, and reactive to light.  Neck: Normal range of motion. Neck supple. No JVD present.  Cardiovascular: Normal rate, S1 normal, S2 normal, normal heart sounds and intact distal pulses.  An irregularly irregular rhythm present. Exam reveals no gallop and no friction rub.   No murmur heard. Pulmonary/Chest: Effort normal and breath sounds normal. No respiratory distress. He has no wheezes. He has no rales. He exhibits no tenderness.  Abdominal: Soft. Bowel sounds are normal. He exhibits no distension. There is no tenderness.  Musculoskeletal: Normal range of motion. He exhibits no edema and no tenderness.  Lymphadenopathy:  He has no cervical adenopathy.  Neurological: He is alert and oriented to person, place, and time. Coordination normal.  Skin: Skin is warm and dry. No rash noted. No erythema.  Psychiatric: He has a normal mood and affect. His behavior is normal. Judgment and thought content normal.      Assessment and Plan

## 2013-11-17 NOTE — Assessment & Plan Note (Signed)
Rating anticoagulation. Heart rate relatively well controlled. Recommended he take extra half metoprolol as needed for palpitations

## 2013-11-17 NOTE — Assessment & Plan Note (Signed)
Blood pressure is well controlled on today's visit. No changes made to the medications. 

## 2013-11-17 NOTE — Assessment & Plan Note (Signed)
Cholesterol is at goal on the current lipid regimen. No changes to the medications were made.  

## 2013-11-17 NOTE — Assessment & Plan Note (Signed)
Currently with no symptoms of angina. No further workup at this time. Continue current medication regimen. 

## 2013-11-17 NOTE — Patient Instructions (Signed)
You are doing well. No medication changes were made.  Take extra metoprolol as needed for fast heart rate (extra 1/2 pill)  Please call us if you have new issues that need to be addressed before your next appt.  Your physician wants you to follow-up in: 6 months.  You will receive a reminder letter in the mail two months in advance. If you don't receive a letter, please call our office to schedule the follow-up appointment.

## 2014-03-25 ENCOUNTER — Other Ambulatory Visit: Payer: Self-pay | Admitting: *Deleted

## 2014-03-25 MED ORDER — RIVAROXABAN 20 MG PO TABS: 20.0000 mg | ORAL_TABLET | Freq: Every day | ORAL | Status: DC

## 2014-03-25 MED ORDER — HYDROCHLOROTHIAZIDE 25 MG PO TABS: 25.0000 mg | ORAL_TABLET | Freq: Every day | ORAL | Status: DC

## 2014-03-25 MED ORDER — METOPROLOL TARTRATE 100 MG PO TABS: 100.0000 mg | ORAL_TABLET | Freq: Two times a day (BID) | ORAL | Status: DC

## 2014-03-25 MED ORDER — ATORVASTATIN CALCIUM 80 MG PO TABS: 80.0000 mg | ORAL_TABLET | Freq: Every day | ORAL | Status: DC

## 2014-03-25 MED ORDER — ENALAPRIL MALEATE 20 MG PO TABS: 20.0000 mg | ORAL_TABLET | Freq: Every day | ORAL | Status: DC

## 2014-03-25 NOTE — Telephone Encounter (Signed)
Requested Prescriptions   Signed Prescriptions Disp Refills  . metoprolol (LOPRESSOR) 100 MG tablet 60 tablet 3    Sig: Take 1 tablet (100 mg total) by mouth 2 (two) times daily.    Authorizing Provider: [name]    Ordering User: Eugenio Hoes, MARINA C  . atorvastatin (LIPITOR) 80 MG tablet 30 tablet 3    Sig: Take 1 tablet (80 mg total) by mouth daily.    Authorizing Provider: [name]    Ordering User: Britt Bottom hydrochlorothiazide (HYDRODIURIL) 25 MG tablet 30 tablet 3    Sig: Take 1 tablet (25 mg total) by mouth daily.    Authorizing Provider: [name]    Ordering User: Eugenio Hoes, MARINA C  . enalapril (VASOTEC) 20 MG tablet 30 tablet 3    Sig: Take 1 tablet (20 mg total) by mouth daily.    Authorizing Provider: [name]    Ordering User: Britt Bottom rivaroxaban (XARELTO) 20 MG TABS tablet 30 tablet 3    Sig: Take 1 tablet (20 mg total) by mouth daily.    Authorizing Provider: [name]    Ordering User: Britt Bottom

## 2014-03-26 ENCOUNTER — Other Ambulatory Visit: Payer: Self-pay

## 2014-03-26 MED ORDER — RIVAROXABAN 20 MG PO TABS: 20.0000 mg | ORAL_TABLET | Freq: Every day | ORAL | Status: DC

## 2014-03-26 MED ORDER — HYDROCHLOROTHIAZIDE 25 MG PO TABS: 25.0000 mg | ORAL_TABLET | Freq: Every day | ORAL | Status: DC

## 2014-03-26 MED ORDER — ATORVASTATIN CALCIUM 80 MG PO TABS: 80.0000 mg | ORAL_TABLET | Freq: Every day | ORAL | Status: DC

## 2014-03-26 MED ORDER — ENALAPRIL MALEATE 20 MG PO TABS: 20.0000 mg | ORAL_TABLET | Freq: Every day | ORAL | Status: DC

## 2014-03-26 MED ORDER — METOPROLOL TARTRATE 100 MG PO TABS: 100.0000 mg | ORAL_TABLET | Freq: Two times a day (BID) | ORAL | Status: DC

## 2014-03-26 NOTE — Telephone Encounter (Signed)
Refill sent for xarelto  

## 2014-03-26 NOTE — Telephone Encounter (Signed)
Refill sent for metoprolol and atorvastatin '

## 2014-03-26 NOTE — Telephone Encounter (Signed)
Refill sent for enalapril 20 mg

## 2014-05-13 ENCOUNTER — Other Ambulatory Visit: Payer: Self-pay

## 2014-05-13 MED ORDER — ATORVASTATIN CALCIUM 80 MG PO TABS: 80.0000 mg | ORAL_TABLET | Freq: Every day | ORAL | Status: DC

## 2014-05-13 MED ORDER — ENALAPRIL MALEATE 20 MG PO TABS: 20.0000 mg | ORAL_TABLET | Freq: Every day | ORAL | Status: DC

## 2014-05-13 MED ORDER — METOPROLOL TARTRATE 100 MG PO TABS: 100.0000 mg | ORAL_TABLET | Freq: Two times a day (BID) | ORAL | Status: DC

## 2014-05-13 MED ORDER — RIVAROXABAN 20 MG PO TABS: 20.0000 mg | ORAL_TABLET | Freq: Every day | ORAL | Status: DC

## 2014-05-13 MED ORDER — HYDROCHLOROTHIAZIDE 25 MG PO TABS: 25.0000 mg | ORAL_TABLET | Freq: Every day | ORAL | Status: DC

## 2014-07-21 ENCOUNTER — Encounter: Payer: Self-pay | Admitting: Cardiovascular Disease

## 2014-07-21 ENCOUNTER — Ambulatory Visit (INDEPENDENT_AMBULATORY_CARE_PROVIDER_SITE_OTHER): Payer: Medicare Other | Admitting: Cardiovascular Disease

## 2014-07-21 VITALS — BP 140/78 | HR 69 | Ht 71.0 in | Wt 203.8 lb

## 2014-07-21 DIAGNOSIS — I4891 Unspecified atrial fibrillation: Secondary | ICD-10-CM

## 2014-07-21 DIAGNOSIS — Z7189 Other specified counseling: Secondary | ICD-10-CM | POA: Insufficient documentation

## 2014-07-21 DIAGNOSIS — I2510 Atherosclerotic heart disease of native coronary artery without angina pectoris: Secondary | ICD-10-CM | POA: Diagnosis not present

## 2014-07-21 DIAGNOSIS — Z951 Presence of aortocoronary bypass graft: Secondary | ICD-10-CM

## 2014-07-21 DIAGNOSIS — E785 Hyperlipidemia, unspecified: Secondary | ICD-10-CM | POA: Diagnosis not present

## 2014-07-21 DIAGNOSIS — I10 Essential (primary) hypertension: Secondary | ICD-10-CM | POA: Diagnosis not present

## 2014-07-21 MED ORDER — RIVAROXABAN 20 MG PO TABS: 20.0000 mg | ORAL_TABLET | Freq: Every day | ORAL | Status: DC

## 2014-07-21 MED ORDER — ENALAPRIL MALEATE 20 MG PO TABS: 20.0000 mg | ORAL_TABLET | Freq: Every day | ORAL | Status: DC

## 2014-07-21 MED ORDER — METOPROLOL TARTRATE 100 MG PO TABS: 100.0000 mg | ORAL_TABLET | Freq: Two times a day (BID) | ORAL | Status: DC

## 2014-07-21 MED ORDER — ATORVASTATIN CALCIUM 80 MG PO TABS: 80.0000 mg | ORAL_TABLET | Freq: Every day | ORAL | Status: DC

## 2014-07-21 MED ORDER — HYDROCHLOROTHIAZIDE 25 MG PO TABS: 25.0000 mg | ORAL_TABLET | Freq: Every day | ORAL | Status: DC

## 2014-07-21 NOTE — Assessment & Plan Note (Signed)
Blood pressure is well controlled on today's visit. No changes made to the medications. 

## 2014-07-21 NOTE — Progress Notes (Signed)
Patient ID: Jordan Perkins, male    DOB: 03/19/1941, 73 y.o.   MRN: [id number]  HPI Comments: Jordan Perkins is a pleasant 73 year old gentleman with recent hospital admission at the end of September 2014, past medical history of CAD, bypass surgery, stents in 2005, hypertension, hyperlipidemia, atrial fibrillation who presented to the hospital with shortness of breath and chest pain, rapid atrial fibrillation. Heart rate was initially 140s He had CABG in 2005 at [facility] with stents placed 2 months later  hospital admission in 2014, noted to have gallstones. He had a negative Hida scan. Now s/p  Cholecystectomy 11/14 Stress test was performed that showed no ischemia Echocardiogram showed ejection fraction 60-65%, mild to moderate TR, moderately elevated right ventricular systolic pressures He was started on anticoagulation, xarelto 20 mg daily for his atrial fibrillation.   In followup today, he reports that he is doing well.  He reports having some GI bleeding and stopped the anticoagulation on his own. Currently takes aspirin He denies any chest pain or shortness of breath with exertion. He does not appreciate his atrial fibrillation. Denies any lower extremity edema, no weight change Tolerating his other medications  EKG shows atrial fibrillation with rate 69 bpm, no significant ST or T-wave changes  CT scan in the hospital of his abdomen and pelvis showed acute pancreatitis with increased fluid density in the peri-pancreatic fat, multiple gallstones, small hiatal hernia    Outpatient Encounter Prescriptions as of 07/21/2014  Medication Sig  . aspirin 81 MG tablet Take 81 mg by mouth daily.  Marland Kitchen atorvastatin (LIPITOR) 80 MG tablet Take 1 tablet (80 mg total) by mouth daily.  . enalapril (VASOTEC) 20 MG tablet Take 1 tablet (20 mg total) by mouth daily.  . hydrochlorothiazide (HYDRODIURIL) 25 MG tablet Take 1 tablet (25 mg total) by mouth daily.  . metoprolol (LOPRESSOR) 100 MG tablet Take 1  tablet (100 mg total) by mouth 2 (two) times daily.  . rivaroxaban (XARELTO) 20 MG TABS tablet Take 1 tablet (20 mg total) by mouth daily.   Social history  reports that he has never smoked. He does not have any smokeless tobacco history on file. He reports that he does not drink alcohol or use illicit drugs.  Review of Systems  Constitutional: Negative.   Respiratory: Negative.   Cardiovascular: Negative.   Gastrointestinal: Negative.   Musculoskeletal: Negative.   Neurological: Negative.   Hematological: Negative.   All other systems reviewed and are negative.   BP 140/78 mmHg  Pulse 69  Ht 5\' 11"  (1.803 m)  Wt 203 lb 12 oz (92.42 kg)  BMI 28.43 kg/m2  Physical Exam  Constitutional: He is oriented to person, place, and time. He appears well-developed and well-nourished.  HENT:  Head: Normocephalic.  Nose: Nose normal.  Mouth/Throat: Oropharynx is clear and moist.  Eyes: Conjunctivae are normal. Pupils are equal, round, and reactive to light.  Neck: Normal range of motion. Neck supple. No JVD present.  Cardiovascular: Normal rate, regular rhythm, S1 normal, S2 normal, normal heart sounds and intact distal pulses.  An irregularly irregular rhythm present. Exam reveals no gallop and no friction rub.   No murmur heard. Pulmonary/Chest: Effort normal and breath sounds normal. No respiratory distress. He has no wheezes. He has no rales. He exhibits no tenderness.  Abdominal: Soft. Bowel sounds are normal. He exhibits no distension. There is no tenderness.  Musculoskeletal: Normal range of motion. He exhibits no edema or tenderness.  Lymphadenopathy:    He has no  cervical adenopathy.  Neurological: He is alert and oriented to person, place, and time. Coordination normal.  Skin: Skin is warm and dry. No rash noted. No erythema.  Psychiatric: He has a normal mood and affect. His behavior is normal. Judgment and thought content normal.      Assessment and Plan   Nursing note  and vitals reviewed.

## 2014-07-21 NOTE — Patient Instructions (Addendum)
You are doing well.  Please stop the aspirin Restart xarelto one a day Call the office if you have bleeding  Please call us if you have new issues that need to be addressed before your next appt.  Your physician wants you to follow-up in: 6 months.  You will receive a reminder letter in the mail two months in advance. If you don't receive a letter, please call our office to schedule the follow-up appointment.

## 2014-07-21 NOTE — Assessment & Plan Note (Signed)
Currently with no symptoms of angina. No further workup at this time. We will hold the aspirin, he will start xarelto for atrial fibrillation

## 2014-07-21 NOTE — Assessment & Plan Note (Signed)
Long discussion with him today concerning aspirin, Plavix, xarelto and the risk of stroke, benefits of xarelto over the other 2 agents. He will restart his xarelto 20 mg daily.

## 2014-07-21 NOTE — Assessment & Plan Note (Signed)
Currently with no symptoms of angina. No further testing at this time 

## 2014-07-21 NOTE — Assessment & Plan Note (Signed)
Heart rate well controlled. Long discussion today concerning his anticoagulation. He is on aspirin, would like to restart Plavix We discussed that with aspirin and Plavix, he continues to be high risk of stroke and should retry xarelto. He did have minimal bleeding likely from hemorrhoids on one occasion. No further episodes recently. We have suggested he retry the xarelto 20 mg daily. If he has recurrent bleeding, we could potentially try the 15 mg dose. He will hold the aspirin

## 2014-07-21 NOTE — Assessment & Plan Note (Signed)
Cholesterol is at goal on the current lipid regimen. No changes to the medications were made.  

## 2014-12-31 NOTE — H&P (Signed)
PATIENT NAME:  Jordan Perkins, Jordan Perkins MR#:  [id number] DATE OF BIRTH:  1941/04/18  DATE OF ADMISSION:  06/06/2013  ADMITTING PHYSICIAN: Gladstone Lighter, MD  PRIMARY CARE PHYSICIAN: Currently none.   CHIEF COMPLAINT: Chest pain and palpitations.   HISTORY OF PRESENT ILLNESS: Mr. Lecomte is a 74 year old Caucasian male with past medical history significant for coronary artery disease, status post bypass graft surgery and stents put in in 2005, hypertension, hyperlipidemia and history of atrial fibrillation, presented to the hospital secondary to above-mentioned complaints. The patient said his bypass graft surgery was in 2005 at [facility], followed 2 months later with more chest pain and had stents put in at that time. He also has known history of A. fib and does not seem like he is on any anticoagulation. However, PCP changed about a year ago and since then, he did not find a new PCP and has not been taking his medications because he was feeling fine until 2 days ago. His symptoms started with chest pain, palpitations, getting easily dyspneic on exertion. Started to feel hot in flashes with some nausea, chills, palpitations. Had an episode of vomiting. The pain was heavy in the chest, radiating to the back around the shoulders. It was not getting any better, so this morning he woke up and called EMS. Right now he is on a nitroglycerin patch, received a dose of Lovenox and aspirin and appears comfortable. He was also noted to be in rapid A. fib, heart rate in the 130s to 140s when he presented and improved with IV Cardizem pushes.   PAST MEDICAL HISTORY: 1.  Coronary artery disease, status post bypass graft surgery and stents put in in 2005.  2.  Atrial fibrillation.  3.  Hypertension.  4.  Borderline diabetic, diet controlled.  5.  Hyperlipidemia.   PAST SURGICAL HISTORY: Coronary artery bypass graft surgery.   ALLERGIES TO MEDICATIONS: SULFA MEDICATIONS.   HOME MEDICATIONS: Currently taking no medications at  home for the past 1 year, but he has empty bottles of the following medications that he was taking in the past:  1.  Aspirin 81 mg p.o. daily.  2.  Plavix 75 mg p.o. daily.  3.  Metoprolol 100 mg p.o. b.i.d.  4.  HCTZ 25 mg p.o. daily.  5.  Atorvastatin 80 mg p.o. daily.  6.  Enalapril 20 mg p.o. daily.   SOCIAL HISTORY: Lives at home with his brother. Never smoked in the past. No history of alcohol use.   FAMILY HISTORY: Mom had bone cancer and dad had heart disease, and paternal uncle with pacemaker and heart problems.   REVIEW OF SYSTEMS:   CONSTITUTIONAL: No fever, fatigue or weakness. Positive for chills. EYES: No blurry vision, double vision, inflammation, glaucoma or cataracts.  EARS, NOSE, THROAT: No tinnitus, ear pain, hearing loss, epistaxis or discharge.  RESPIRATORY: Positive for cough. No wheeze, hemoptysis or COPD.  CARDIOVASCULAR: Positive for chest pain and orthopnea and palpitations. No syncope. Positive for arrhythmia.  GASTROINTESTINAL: Positive for nausea and vomiting. Positive for constipation. No abdominal pain, diarrhea, hematemesis or melena.  GENITOURINARY: No dysuria, hematuria, renal calculus, frequency or incontinence.  ENDOCRINE: No polyuria, nocturia, thyroid problems, heat or cold intolerance.  HEMATOLOGIC: No anemia, easy bruising or bleeding.  SKIN: No acne, rash or lesions.  MUSCULOSKELETAL: No neck, back, shoulder pain, arthritis or gout.  NEUROLOGIC: No numbness, weakness, CVA, TIA or seizures.  PSYCHOLOGICAL: No anxiety, insomnia or depression.   PHYSICAL EXAMINATION: VITAL SIGNS: Temperature 98.9 degrees Fahrenheit. Pulse  was 130 when he came in, respirations 22, blood pressure 174/95, pulse ox 95% on room air.  GENERAL: Well-built, well-nourished male lying in bed, not in any acute distress.  HEENT: Normocephalic, atraumatic. Pupils equal, round, reacting to light. Anicteric sclerae. Extraocular movements intact. Oropharynx clear without erythema,  mass or exudates.  NECK: Supple. No thyromegaly, JVD or carotid bruits. No lymphadenopathy.  LUNGS: Moving air bilaterally. No wheeze or crackles. No use of accessory muscles for breathing.  CARDIOVASCULAR: S1, S2, regular rhythm and rapid rate. No murmurs, rubs or gallops.  ABDOMEN: Soft, nontender, nondistended. No hepatosplenomegaly. Normal bowel sounds.  EXTREMITIES: No pedal edema, no clubbing or cyanosis, 2+ dorsalis pedis pulses palpable bilaterally.  SKIN: No acne, rash or lesions.  LYMPHATIC: No cervical or inguinal lymphadenopathy.  NEUROLOGIC: Cranial nerves are intact. No focal motor or sensory deficits.  PSYCHOLOGICAL: The patient is awake, alert, oriented x 3.   LABORATORY DATA: WBC 25.6, hemoglobin 10.7, hematocrit 45.1, platelet count 161.   Sodium 135, potassium 3.3, chloride 98, bicarbonate 30, BUN 16, creatinine 1.07, glucose 162, calcium of 8.9.   ALT 167, AST 79, alkaline phosphatase 106, total bilirubin of 3.7, albumin of 3.6.   Urinalysis negative for any infection.   First set of cardiac enzymes is negative. CK 117, CK-MB 3.3, troponin less than 0.02.   PTT 25.4, INR 1.0.   Chest x-ray revealing bibasilar atelectasis, maybe low-grade CHF present.   EKG showing atrial fibrillation, rapid heart rate of 131.   ASSESSMENT AND PLAN: A 74 year old male with past medical history of atrial fibrillation not on anticoagulation, hypertension, coronary artery disease status post stents and bypass graft surgery at [facility] in 2005, who has not been following up with a primary care physician for more than a year and not taking his medications since then. Comes with chest pain and noted to be in atrial fibrillation with rapid ventricular response.  1.  Chest pain: Could be unstable angina. Will admit to telemetry. Recycle cardiac enzymes. Will order Myoview and also echocardiogram for ejection fraction and will get a cardiology consult. The patient does not have an outpatient  cardiologist. We will restart on medications including nitroglycerin paste for now, aspirin, Plavix statin, metoprolol and enalapril.  2.  Atrial fibrillation with rapid ventricular response: Restart his oral metoprolol and add IV Cardizem pushes, as rate not well controlled. Will get cardiology consult. Started on Lovenox twice daily for now, but the patient will need long-term anticoagulation.  3.  Hypertension: On enalapril, hydrochlorothiazide and metoprolol.  4.  Hyperlipidemia: Check lipid profile. Hold statin due to elevated LFTs.  5.  Elevated LFTs: Will get hepatic panel and also abdominal ultrasound to rule out any obstruction, as bilirubin is also elevated.  6.  Leukocytosis: Continue to monitor in the morning. No source of infection identified yet. Not on antibiotics at this time.  7.  Gastrointestinal and deep vein thrombosis prophylaxis with Protonix and Lovenox.   CODE STATUS: Full code.   TIME SPENT ON ADMISSION: 50 minutes.   ____________________________ Gladstone Lighter, MD [email or web address] D: 06/06/2013 12:21:07 ET T: 06/06/2013 13:00:32 ET JOB#: [id number]  cc: Gladstone Lighter, MD, <Dictator> Gladstone Lighter MD ELECTRONICALLY SIGNED 06/06/2013 16:49

## 2014-12-31 NOTE — Consult Note (Signed)
Brief Consult Note: Diagnosis: cholecystitis.   Patient was seen by consultant.   Consult note dictated.   Recommend further assessment or treatment.   Orders entered.   Comments: suspect acute cholecystitis in pt with significant cardiac disease, now  febrile. Will get HIDA in am to confirm acute chole.  Electronic Signatures: Florene Glen (MD)  (Signed 28-Sep-14 17:29)  Authored: Brief Consult Note   Last Updated: 28-Sep-14 17:29 by Florene Glen (MD)

## 2014-12-31 NOTE — Discharge Summary (Signed)
PATIENT NAME:  Jordan Perkins, Jordan Perkins MR#:  [id number] DATE OF BIRTH:  [birth year]  DATE OF ADMISSION:  06/06/2013 DATE OF DISCHARGE:  06/12/2013  For a detailed note, please take a look at the history and physical done on admission by Dr. Tressia Miners.   DIAGNOSES AT DISCHARGE: Atrial fibrillation. Chest pain/epigastric pain, likely related to possible acute pancreatitis, likely gallstone pancreatitis. Hypertension. Fever of unknown origin. Gallstones with abnormal liver function tests. Hyperlipidemia. History of coronary artery disease.   DIET: The patient is being discharged on a low-sodium, low-fat diet.   ACTIVITY: As tolerated.   FOLLOWUP: With Dr. Rexene Edison in the next 1 to 2 weeks. Also follow up with Dr. Ida Rogue from cardiology in the next 1 to 2 weeks.   DISCHARGE MEDICATIONS: Enalapril 20 mg daily, atorvastatin 80 mg at bedtime, metoprolol tartrate 100 mg b.i.d., hydrochlorothiazide 25 mg daily, Xarelto 20 mg daily, Levaquin 750 mg daily x 5 days and also metronidazole 500 mg b.i.d. x 5 days.   [facility] COURSE: Dr. Marlyce Huge from general surgery, Dr. Adrian Prows from infectious disease, Dr. Kathlyn Sacramento and Dr. Ida Rogue from cardiology.   PERTINENT STUDIES DONE DURING THE HOSPITAL COURSE: A chest x-ray done on admission showing bibasilar atelectasis, low-grade CHF. An ultrasound of the abdomen showing multiple gallstones. Gallbladder wall is abnormally thickened with trace pericholecystic fluid. A hepatobiliary nuclear medicine scan done on October 1 showing unremarkable hepatobiliary evaluation. A CT scan of the abdomen and pelvis done with contrast showing findings consistent with acute pancreatitis with increased fluid density in the peripancreatic fat, multiple gallstones. No common bile duct dilatation. No evidence of intrahepatic ductal dilatation. No evidence of urinary tract stones or obstruction. Left lower lobe pneumonia, as well as  small pleural effusion. Small hiatal hernia.   HOSPITAL COURSE: This is a 74 year old male with medical problems as mentioned above, presented to the hospital on 06/06/2013 due to chest pain/epigastric pain. Noted also to be in rapid atrial fibrillation.  1.  Chest pain: The patient apparently was not taking any medications, nor had any followup with doctor  prior to coming into the hospital. He has a history of coronary artery disease and bypass surgery and also atrial fibrillation. Therefore, he was admitted to the hospital and started on aspirin and some beta blocker and a statin. A cardiology consult was obtained. The patient underwent a myocardial nuclear medicine stress test done on 06/08/2013, which showed no evidence of myocardial ischemia with a normal ejection fraction. The patient since then has been chest pain-free and hemodynamically stable, therefore, is being discharged on aspirin, on metoprolol and a statin with close followup with cardiology.  2.  Atrial fibrillation: This is chronic for the patient. The patient apparently was not compliant with meds prior to coming in. He was restarted back on his metoprolol and his rates have been fairly controlled on that. His echo did show a normal ejection fraction with no evidence of thrombus. He currently remains in A. fib, but rate controlled. Initially he was started on Lovenox, but currently he is being discharged on Xarelto for long-term anticoagulation, as he remains in chronic atrial fibrillation. The patient will follow up with Dr. Fletcher Anon or Dr. Rockey Situ from cardiology as outpatient.  3.  Fever of unknown origin: The patient apparently spiked a fever of as high as 102 while in the hospital. The exact source of the fever was unclear initially. His urinalysis on admission was negative. His chest x-ray did not show evidence  of acute pneumonia. His abdominal ultrasound, as he had abnormal LFTs, did show some gallstones; therefore, a surgical consult was  obtained. They wanted to get a HIDA scan. The patient therefore underwent a HIDA scan, which was unremarkable, although the patient still continued to have low-grade fevers with leukocytosis. Therefore, an infectious disease consult was obtained. The patient underwent abdominal CT, which showed no evidence of any abscess but did show evidence of acute pancreatitis and still multiple gallstones, and it did show a questionable left lower lobe pneumonia. When the patient had originally spiked a fever of 102, the patient was treated empirically with IV Zosyn but after the infectious disease consult was obtained, the patient was switched over to ciprofloxacin and Flagyl and at present, the patient is being discharged on Levaquin and Flagyl to treat for possible underlying pneumonia and also a biliary pathology. The patient likely needs a laparoscopic cholecystectomy in the near future, therefore, is being set up for a followup with general surgery with Dr. Rexene Edison, who will possibly evaluate the patient for a cholecystectomy in the near future. The patient currently does have a leukocytosis of 21,000 but has had low-grade fevers, does not have any acute respiratory symptoms, does not have any acute GI symptoms, therefore, is being discharged home on oral antibiotics as mentioned.  4.  History of coronary artery disease with history of bypass: The patient is currently chest pain-free. We will continue aspirin, beta blocker, ACE inhibitor and statin. As mentioned, his Myoview done in the hospital showed no evidence of acute cardiovascular ischemia.  5.  Hypertension: The patient has remained hemodynamically stable on the hydrochlorothiazide, metoprolol and enalapril and he will resume that.  6.  Hyperlipidemia: The patient was maintained on his atorvastatin and he will resume that upon discharge.   CODE STATUS: The patient is a full code.   TIME SPENT ON DISCHARGE: 40 minutes.    ____________________________ Belia Heman. Verdell Carmine, MD [email or web address] D: 06/12/2013 14:48:03 ET T: 06/12/2013 15:41:03 ET JOB#: [id number]  cc: Belia Heman. Verdell Carmine, MD, <Dictator> Christopher A. Rexene Edison, MD [name], MD Henreitta Leber MD ELECTRONICALLY SIGNED 06/17/2013 19:16

## 2014-12-31 NOTE — Consult Note (Signed)
PATIENT NAME:  Jordan Perkins, Jordan Perkins MR#:  [id number] DATE OF BIRTH:  [birth year]  DATE OF CONSULTATION:  06/10/2013  REFERRING PHYSICIAN:  Vivek J. Verdell Carmine, MD  CONSULTING PHYSICIAN:  Cheral Marker. Ola Spurr, MD  REASON FOR CONSULT: Fever and elevated white count.   HISTORY OF PRESENT ILLNESS: This is a 74 year old gentleman who has a history of coronary artery disease, status post bypass, as well as atrial fibrillation who was admitted 09/27 with chest pain and palpitations. Discussing more with him today, he reports that he felt ill for 1 to 2 days prior. He described the chest pain as in the center of his chest. He also notes that he had been having some pain in his abdomen for 1 to 2 days prior. He did not have any fever that he knew of. He did have some nausea and was "spitting up." The patient was admitted and underwent work-up including Cards eval and evaluation by Surgery. He did have noted positive ultrasound showing some gallstones but no active cholecystitis. He then had a negative HIDA scan. He has been covered with antibiotics; however, he is still having intermittent temperatures.   The patient does report that his abdominal pain, chest pain, has all resolved. He is really not aware of the fevers. He is not having any further vomiting. He is tolerating a regular diet.   PAST MEDICAL HISTORY: 1.  Coronary disease, status post CABG.  2.  A-fib, not really following up with anticoagulation.  3.  Hypertension.  4.  Borderline diabetic diet control.  5.  Hyperlipidemia.   PAST SURGICAL HISTORY: Coronary artery disease.   ALLERGIES: SULFA DRUGS.   MEDICATIONS: Current antibiotics include Zosyn. This was started on 09/27. Other meds include atorvastatin, Tylenol, aspirin, diltiazem, Colace, enalapril, hydrochlorothiazide, metoprolol, Zofran, pantoprazole, potassium.   SOCIAL HISTORY: The patient is single. He has no children. He lives with his brother. He is retired from [employer] . He does not  smoke or drink.  FAMILY HISTORY: Positive for mother with bone cancer and father with heart disease.   REVIEW OF SYSTEMS: Eleven systems reviewed and negative except as per HPI.   PHYSICAL EXAM: VITAL SIGNS: T-max over the last 24 hours is 100.9 on 09/30. His temperature was 100.3 at 7 p.m. on 09/30. Prior to that, he had been having a daily T-max of up to 102.7 on 09/28. Blood pressure currently is 125/73, pulse 95, respirations 19, sat of 97% on room air.  GENERAL: He is pleasant. He is quiet. He is a poor historian.  He is lying quietly in bed.  HEENT: Pupils are equal, round and reactive to light and accommodation. Extraocular movements are intact. Sclerae are anicteric. Oropharynx is clear. He does have poor dentition. He has no thrush.  NECK: Supple.  HEART: He has no anterior cervical, posterior cervical or supraclavicular lymphadenopathy.  HEART: Regular with a 1/6 systolic murmur.  LUNGS: Clear to auscultation bilaterally.  ABDOMEN: Slightly distended. Positive normal bowel sounds, nontender.  SKIN: He has multiple old scars on his lower extremities. He has very bad onychomycosis of his toenails but no active abscesses or cellulitis.  NEUROLOGICAL: He is alert and oriented x 3. Grossly nonfocal neuro exam but somewhat slow mentation.   LABORATORY AND DIAGNOSTIC DATA:  Blood cultures x 2 from 09/28 are negative. Urinalysis on admission showed 2 white cells, 2 red cells.  Hepatitis acute panel was negative for IgM for hepatitis A, hepatitis B surface antigen, hepatitis B core and hepatitis C.  White blood on  admission was 25.  It spiked to 29 on 09/28 with a neutrophil count of 25.6. Currently, it is down to 16.3. Neutrophil count is 13.4, eosinophil count is only 0.1. Hemoglobin is 12.6, platelets of 148, which is a drop from 161 at admission. On admission, his LFTs had an elevated ALT of 167, AST of 79, alk phos normal at 106 and T-bili elevated at 2.7. Most recent LFTs show T-bili down to  1.4, and his AST and ALT have normalized. Renal function is normal with a creatinine of 1.26.  Radiology chest x-ray done on admission 09/27 showed bibasilar atelectasis on the left and possibly on the right, otherwise normal. Ultrasound of the abdomen showed multiple gallstones present, but the gallbladder wall was not  abnormally thickened, but there may be trace pericholecystic fluid. There was no positive sonographic Murphy's sign. The liver and common bile ducts were normal. The pancreas was obscured by gas. HIDA scan done 10/01 was normal.   IMPRESSION: A 74 year old gentleman with coronary artery disease and a-fib, admitted with what he describes as chest pain but also likely right upper quadrant abdominal pain. He had at that time an elevated white count greater than 20 as well as elevated ALT, AST and alk phos. He has had ultrasound that only showed gallstones but was negative for HIDA scan. He is having no pain now. His LFTs have normalized. His white blood count is slowly decreasing. He continues to have some intermittent fevers.   At this point, I think he likely did have cholecystitis at one point and passed a stone. This would explain the abdominal pain he was having, the nausea, the elevated white count and the elevated LFTs. He clinically improved from this point of view. Another possibility for the continued intermittent fevers is drug fever with the Zosyn that he is on.   RECOMMENDATIONS: 1.  I recommend we check an HIV test in the morning.  2.  I suggest we change him off of Zosyn to Cipro and Flagyl. This would get rid of the beta lactams that can cause possible fevers.  3.  I would recommend checking a CBC in the morning. He likely could be DC'd  if his fevers stabilized. I do, again, think it is likely a biliary source and would suggest he follow up with Surgery and possibly have cholecystectomy in the future.  4. Thank you for the consult. I would be glad to follow with you.    ____________________________ Cheral Marker. Ola Spurr, MD dpf:cb D: 06/10/2013 16:16:12 ET T: 06/10/2013 16:46:56 ET JOB#: [id number]  cc: Cheral Marker. Ola Spurr, MD, <Dictator> Kierstan Auer Ola Spurr MD ELECTRONICALLY SIGNED 06/15/2013 22:32

## 2014-12-31 NOTE — Consult Note (Signed)
PATIENT NAME:  Jordan Perkins, CHERNE MR#:  [id number] DATE OF BIRTH:  May 24, 1941  DATE OF CONSULTATION:  06/07/2013  REFERRING PHYSICIAN:   CONSULTING PHYSICIAN:  Jerrol Banana. Burt Knack, MD  CHIEF COMPLAINT: Abdominal pain.   HISTORY OF PRESENT ILLNESS: This is a patient with approximately 4 days of abdominal pain. He is 74 years old and has had some significant coronary artery disease and has had both surgery and stents for that but he now presents with what was believed to be cardiac symptoms, but his symptoms are also consistent with potential gallbladder disease.  It is in the right upper quadrant and epigastrium, it has been going on for 4 days. He has had some nausea but no emesis and has now had some fevers as well but not no fevers at home. He denies jaundice or acholic stools. I was asked to see the patient for possible gallbladder disease. A workup has suggested gallbladder disease as a possible etiology.   PAST MEDICAL HISTORY: Significant coronary artery disease, atrial fibrillation, hypertension, diet-controlled diabetes and hyperlipidemia.   PAST SURGICAL HISTORY: Coronary artery bypass graft and stents. He denies any abdominal surgery.   ALLERGIES: SULFA.   MEDICATIONS: Multiple, see chart. He has not been taking his medications at home but is supposed to be on aspirin and Plavix and has been started on Xarelto here in the hospital.   SOCIAL HISTORY: The patient is currently retired. He does not smoke and does not drink. He worked in a [employer].   FAMILY HISTORY:  Noncontributory.    REVIEW OF SYSTEMS: A 10-system review is performed and negative with the exception of that mentioned in the HPI.   PHYSICAL EXAMINATION GENERAL: Healthy, comfortable-appearing Caucasian male patient.  VITAL SIGNS: Stable.  Temp of 102.7, pulse 94, respirations 18, blood pressure 117/62, 94% room air sat.  HEENT: No scleral icterus.  NECK: No palpable neck nodes.  CHEST: Clear to auscultation.  CARDIAC:  Regular rate and rhythm.  ABDOMEN: Tender in the epigastrium and right upper quadrant.  It is nondistended, nontympanitic and a questionable Murphy's sign is present.  EXTREMITIES: Show minimal edema.  NEUROLOGIC: Grossly intact.  INTEGUMENT: No jaundice.   LABORATORY VALUES: Demonstrate a clean UA.  Sodium of 135, potassium 3.3, bilirubin is 2.7, alkaline phosphatase 106, AST and ALT 79 and 167. White blood cell count is 29.0, H and H 14.6 and 43.3 and a platelet count of 154.   IMAGING STUDIES:  An ultrasound shows stones, no thickening of the gallbladder wall, questionable trace pericholecystic fluid.   ASSESSMENT AND PLAN: This is a patient with right upper quadrant and epigastric pain and a workup for cardiac disease and known cardiac disease which currently shows not much evidence for active new cardiac disease that is the role for his pain.  This has been going on for 4 days and workup has shown gallstones and he probably has acute cholecystitis. What is unusual that his liver function tests are not necessarily in a pattern typical of choledocholithiasis. He does have an elevated bilirubin and slightly elevated AST and ALT with a normal alkaline phosphatase. My plan would be to obtain a HIDA scan to rule out obstructed cystic duct and acute cholecystitis and re-assess with the potential for surgery if this proves to be acute cholecystitis as the etiology.    ____________________________ Jerrol Banana Burt Knack, MD rec:cs D: 06/07/2013 17:45:00 ET T: 06/07/2013 17:59:21 ET JOB#: [id number]  cc: Jerrol Banana. Burt Knack, MD, <Dictator> Florene Glen MD ELECTRONICALLY SIGNED  06/07/2013 18:22 

## 2014-12-31 NOTE — Consult Note (Signed)
General Aspect 74 year old Caucasian male with past medical history significant for coronary artery disease, status post bypass graft surgery and stents put in in 2005, hypertension, hyperlipidemia and history of atrial fibrillation, presented to the hospital with SOb and chest pain, rapid atrial fibrillation.   His symptoms started with chest pain, palpitations, getting easily dyspneic on exertion. Started to feel hot in flashes with some nausea, chills, palpitations. Had an episode of vomiting. The pain was heavy in the chest, radiating to the back around the shoulders. It was not getting any better, so this morning he woke up and called EMS.  In the ER, he was started on nitroglycerin patch, received a dose of Lovenox and aspirin, noted to be in rapid A. fib, heart rate in the 130s to 140s,  improved with IV Cardizem pushes.  The patient said his bypass graft surgery was in 2005 at [facility], followed 2 months later with more chest pain and had stents put in at that time. He also has known history of A. fib per the notes. He does not have a PMD and has not been taking medications foor one year. he does not drive and relys on friends to drive him to get meds.   Present Illness . ALLERGIES TO MEDICATIONS: SULFA MEDICATIONS.   HOME MEDICATIONS: Currently taking no medications at home for the past 1 year, but he has empty bottles of the following medications that he was taking in the past:  1.  Aspirin 81 mg p.o. daily.  2.  Plavix 75 mg p.o. daily.  3.  Metoprolol 100 mg p.o. b.i.d.  4.  HCTZ 25 mg p.o. daily.  5.  Atorvastatin 80 mg p.o. daily.  6.  Enalapril 20 mg p.o. daily.   SOCIAL HISTORY: Lives at home with his brother. Never smoked in the past. No history of alcohol use.   FAMILY HISTORY: Mom had bone cancer and dad had heart disease, and paternal uncle with pacemaker and heart problems.   Physical Exam:  GEN well developed, well nourished, no acute distress   HEENT hearing intact to  voice, moist oral mucosa   NECK supple   RESP normal resp effort  clear BS   CARD Regular rate and rhythm  No murmur   ABD denies tenderness  soft   LYMPH negative neck   EXTR negative edema   SKIN normal to palpation   NEURO motor/sensory function intact   PSYCH alert, A+O to time, place, person, good insight   Review of Systems:  Subjective/Chief Complaint Chest ppain, ABD ain, constipation   General: Fatigue   Skin: No Complaints   ENT: No Complaints   Eyes: No Complaints   Neck: No Complaints   Respiratory: No Complaints   Cardiovascular: Chest pain or discomfort   Gastrointestinal: Constipation  getting better   Genitourinary: No Complaints   Vascular: No Complaints   Musculoskeletal: No Complaints   Neurologic: No Complaints   Hematologic: No Complaints   Endocrine: No Complaints   Psychiatric: No Complaints   Review of Systems: All other systems were reviewed and found to be negative   Medications/Allergies Reviewed Medications/Allergies reviewed     atrial fib:    cardiac stents:    cardiac bypass:        Admit Diagnosis:   ATRIAL FIBRILLATION: Onset Date: 07-Jun-2013, Status: Active, Description: ATRIAL FIBRILLATION  Home Medications: Medication Instructions Status  atorvastatin 80 mg oral tablet 1 tab(s) orally once a day (at bedtime) Active  clopidogrel 75  mg oral tablet 1 tab(s) orally once a day Active  enalapril 20 mg oral tablet 1 tab(s) orally once a day Active  hydrochlorothiazide 25 mg oral tablet 1 tab(s) orally once a day Active  metoprolol 100 mg oral tablet 1 tab(s) orally 2 times a day Active   Lab Results:  Hepatic:  28-Sep-14 02:39   Bilirubin, Total  2.6  Alkaline Phosphatase 100  SGPT (ALT)  113  SGOT (AST)  45  Total Protein, Serum 6.8  Albumin, Serum  3.2  Routine Chem:  28-Sep-14 02:39   Glucose, Serum  141  BUN 17  Creatinine (comp) 1.21  Sodium, Serum  134  Potassium, Serum  3.0  Chloride,  Serum 98  CO2, Serum 30  Calcium (Total), Serum 8.5  Osmolality (calc) 272  eGFR (African American) >60  eGFR (Non-African American) >60 (eGFR values <47mL/min/1.73 m2 may be an indication of chronic kidney disease (CKD). Calculated eGFR is useful in patients with stable renal function. The eGFR calculation will not be reliable in acutely ill patients when serum creatinine is changing rapidly. It is not useful in  patients on dialysis. The eGFR calculation may not be applicable to patients at the low and high extremes of body sizes, pregnant women, and vegetarians.)  Anion Gap  6  Magnesium, Serum  1.7 (1.8-2.4 THERAPEUTIC RANGE: 4-7 mg/dL TOXIC: > 10 mg/dL  -----------------------)  Cardiac:  27-Sep-14 09:47   Troponin I < 0.02 (0.00-0.05 0.05 ng/mL or less: NEGATIVE  Repeat testing in 3-6 hrs  if clinically indicated. >0.05 ng/mL: POTENTIAL  MYOCARDIAL INJURY. Repeat  testing in 3-6 hrs if  clinically indicated. NOTE: An increase or decrease  of 30% or more on serial  testing suggests a  clinically important change)    17:47   Troponin I < 0.02 (0.00-0.05 0.05 ng/mL or less: NEGATIVE  Repeat testing in 3-6 hrs  if clinically indicated. >0.05 ng/mL: POTENTIAL  MYOCARDIAL INJURY. Repeat  testing in 3-6 hrs if  clinically indicated. NOTE: An increase or decrease  of 30% or more on serial  testing suggests a  clinically important change)  28-Sep-14 02:39   Troponin I < 0.02 (0.00-0.05 0.05 ng/mL or less: NEGATIVE  Repeat testing in 3-6 hrs  if clinically indicated. >0.05 ng/mL: POTENTIAL  MYOCARDIAL INJURY. Repeat  testing in 3-6 hrs if  clinically indicated. NOTE: An increase or decrease  of 30% or more on serial  testing suggests a  clinically important change)  CK, Total 63  CPK-MB, Serum 1.0 (Result(s) reported on 07 Jun 2013 at 03:58AM.)  Routine Hem:  28-Sep-14 02:39   WBC (CBC)  29.0  RBC (CBC)  4.29  Hemoglobin (CBC) 14.6  Hematocrit (CBC) 43.3   Platelet Count (CBC) 154  MCV  101  MCH  34.1  MCHC 33.8  RDW 14.3  Neutrophil % 88.1  Lymphocyte % 6.2  Monocyte % 5.3  Eosinophil % 0.2  Basophil % 0.2  Neutrophil #  25.6  Lymphocyte # 1.8  Monocyte #  1.5  Eosinophil # 0.1  Basophil # 0.0 (Result(s) reported on 07 Jun 2013 at 03:48AM.)   EKG:  Interpretation EKG shows atrial fibrillation with rate 119 bpm, nonspecific ST and T wave changes   Radiology Results: XRay:    27-Sep-14 10:13, Chest Portable Single View  Chest Portable Single View   REASON FOR EXAM:    Chest Pain  COMMENTS:       PROCEDURE: DXR - DXR PORTABLE CHEST SINGLE VIEW  -  Jun 06 2013 10:13AM     RESULT: The lungs are reasonably well inflated. There is subsegmental   atelectasis at the left lung base. The cardiac silhouette is mildly   enlarged. The central pulmonary vascularity is prominent. The patient has   undergone previous median sternotomy.    IMPRESSION:   1. There is basilar atelectasis on the left and possibly on the right.  2. There may be low-grade CHF.     A followup PA and lateral chest x-ray would be of value.   Dictation Site: 5        Verified By: DAVID A. Martinique, M.D., MD  [place]:    28-Sep-14 08:00, US Abdomen Limited Survey  US Abdomen Limited Survey   REASON FOR EXAM:    elevated lfts and bili  COMMENTS:   Body Site: GB and Fossa, CBD, Head of Pancreas; Right Upper   Quad; Live    PROCEDURE: [place]  - US ABDOMEN LIMITED SURVEY  - Jun 07 2013  8:00AM     RESULT: A limited upper abdominal ultrasound wasperformed.    Bowel gas obscures the pancreas. The gallbladder is adequately distended   and contains echogenic mobile shadowing stones and sludge. There is no   positive sonographic Murphy's sign. The gallbladder wall measures 2.5 mm.   There is a tiny amount of pericholecystic fluid. The common bile duct is   top normal at 6.1 mm.    The liver exhibits normal echotexture with no focal mass or ductal     dilation. Portal  venous flow is normal in direction toward the liver.    IMPRESSION:   1. There are multiple gallstones present. The gallbladder wall is not   abnormally thickened but there may be a trace of pericholecystic fluid.   There is no positive sonographic Murphy's sign.  2. The liver and common bile duct are within the limits of normal. The   pancreas is obscured by bowel gas.     Dictation Site: 1        Verified By: DAVID A. Martinique, M.D., MD  Cardiology:    27-Sep-14 14:54, Echo Doppler  Echo Doppler   REASON FOR EXAM:      COMMENTS:       PROCEDURE: [place] - ECHO DOPPLER COMPLETE(TRANSTHOR)  - Jun 06 2013  2:54PM     RESULT: Echocardiogram Report    Patient Name:   Jordan Perkins Date of Exam: 06/06/2013  Medical Rec #:  [id number]        Custom1:  Date ofBirth:  26-Jun-1941    Height:       70.0 in  Patient Age:    56 years      Weight:       200.0 lb  Patient Gender: M             BSA:          2.09 m??    Indications: Angina  Sonographer:    [place]  Referring Phys: [place] Lighter    Summary:   1. Left ventricular ejection fraction, by visual estimation, is 60 to   65%.   2. Normal global left ventricular systolic function.   3. Normal right ventricular size and systolic function.   4. Mildly dilated left atrium.   5. Mild to moderate tricuspid regurgitation.   6. Mildly elevated pulmonary artery systolic pressure.  2D AND M-MODE MEASUREMENTS (normal ranges within parentheses):  Left Ventricle:  Normal  IVSd (2D):      1.27 cm (0.7-1.1)  LVPWd (2D):     1.28 cm (0.7-1.1) Aorta/LA:                  Normal  LVIDd (2D):     4.31 cm (3.4-5.7) Aortic Root (2D): 2.80 cm (2.4-3.7)  LVIDs (2D):     2.30 cm           Left Atrium (2D): 5.40 cm (1.9-4.0)  LV FS (2D):     46.6 %   (>25%)  LV EF (2D):     78.3 %   (>50%)                               Right Ventricle:                                    RVd (2D):  LV DIASTOLIC FUNCTION:  MV Peak E: 1.38 m/s E/e'  Ratio: 15.40  SPECTRAL DOPPLER ANALYSIS (where applicable):  Aortic Valve: AoV Max Vel: 1.58 m/s AoV Peak PG:10.0 mmHg AoV Mean PG:  LVOT Vmax: 0.89 m/s LVOT VTI:  LVOT Diameter: 2.00 cm  AoV Area, Vmax: 1.78 cm?? AoV Area, VTI:  AoV Area, Vmn:  Tricuspid Valve and PA/RV Systolic Pressure: TR Max Velocity: 2.62 m/s RA   Pressure: 10 mmHg RVSP/PASP: 37.6 mmHg  Pulmonic Valve:  PV Max Velocity: 1.34 m/s PV Max PG: 7.2 mmHg PV Mean PG:  Pulmonic Insufficiency:  PI EDV: 1.09 m/s PADP: 14.8 mmHg    PHYSICIAN INTERPRETATION:  Left Ventricle: The left ventricular internal cavity size was normal. LV   posterior wall thickness was normal. No left ventricular hypertrophy.   Global LV systolic function was normal. Left ventricular ejection   fraction, by visual estimation, is 60 to 65%. Spectral Doppler shows   normal pattern of LV diastolic filling.  Right Ventricle: Normal right ventricular size, wall thickness, and   systolic function. The right ventricular size is normal. Global RV   systolic function is normal.  Left Atrium: The left atrium is mildly dilated.  Right Atrium: The right atrium is normal in size.  Pericardium: There is no evidence of pericardial effusion.  Mitral Valve: The mitral valve is normal in structure. Trace mitral valve   regurgitation is seen. MAC noted.  Tricuspid Valve: Mild to moderate tricuspid regurgitation is visualized.   The tricuspid regurgitant velocity is 2.62 m/s, and with an assumed right   atrial pressure of 10 mmHg, the estimated right ventricular systolic   pressure is mildly elevated at 37.6 mmHg.  Aortic Valve: The aortic valve is tricuspid. Mild aortic valve sclerosis   is present, with no evidence of aortic valve stenosis.  Pulmonic Valve: The pulmonic valve is normal. Mild pulmonic valve   regurgitation.    [id number] Ida Rogue MD  Electronically signed by [id number] Ida Rogue MD  Signature Date/Time: 06/06/2013/6:14:49 PM  *** Final  ***    IMPRESSION: .        Verified By: [name], M.D., MD    Sulfa drugs: Rash  Vital Signs/Nurse's Notes: **Vital Signs.:   28-Sep-14 07:56  Vital Signs Type Routine  Temperature Temperature (F) 100.1  Celsius 37.8  Pulse Pulse 92  Respirations Respirations 16  Systolic BP Systolic BP 409  Diastolic BP (mmHg) Diastolic BP (mmHg) 69  Mean BP 85  Pulse Ox % Pulse Ox % 94  Pulse Ox Activity Level  At rest  Oxygen Delivery Room Air/ 21 %    Impression 74 year old Caucasian male with past medical history significant for coronary artery disease, status post bypass graft surgery and stents put in in 2005, hypertension, hyperlipidemia and history of atrial fibrillation, presented to the hospital with SOb and chest pain, rapid atrial fibrillation.  1) Chest ain//angina known CAD, CABG not on medicatications for one year Stress test scheduled for tomorrow Am.  Will hold metoprolol in AM Would restart asa, statin, ACE  2) Atrial fibrillation rate improved on metoprolol BID Would start xarelto 20 mg one a day he does not drive and would not be able to make INR appts. Would give 90 day scripts for all meds. Follow up in out clinic  3) CAD, CABG recent chest pain back on meds, b-blocker, asa, statin, ace  4) HTN: Better on meds, will monitor  5) Hyperlipidemia on statin again, lipitor  6) Dispo uncertain if he has some cognitive deficits Does not drive, not the best historian Follow in out clinic Needs PMD   Electronic Signatures: Ida Rogue (MD)  (Signed 28-Sep-14 12:07)  Authored: General Aspect/Present Illness, History and Physical Exam, Review of System, Past Medical History, Health Issues, Home Medications, Labs, EKG , Radiology, Allergies, Vital Signs/Nurse's Notes, Impression/Plan   Last Updated: 28-Sep-14 12:07 by Ida Rogue (MD)

## 2014-12-31 NOTE — Op Note (Signed)
PATIENT NAME:  Jordan Perkins, Jordan Perkins MR#:  [id number] DATE OF BIRTH:  30-Jun-1941  DATE OF PROCEDURE:  07/13/2013  ATTENDING PHYSICIAN: Dr. Chauncey Reading.   PREOPERATIVE DIAGNOSIS: Gallstone pancreatitis.   POSTOPERATIVE DIAGNOSIS:  Gallstone pancreatitis with gangrenous cholecystitis.   ESTIMATED BLOOD LOSS: 10 mL.   COMPLICATIONS: None.   SPECIMENS: Gallbladder.   ANESTHESIA: General.   IMPLANTS: A 19-French JP drain in gallbladder fossa.   INDICATION FOR SURGERY: Jordan Perkins is a pleasant 74 year old male who had recently been admitted for leukocytosis, fevers and abdominal pain. He was noted to have pancreatitis; however, he had pneumonia at that time.  Plan was for him to follow up for a cholecystectomy and cholangiogram once his pneumonia had resolved. He, thus, presented for workup.   DETAILS OF PROCEDURE: Informed consent was obtained. Jordan Perkins was brought to the operating room. He was laid supine on the operating room table. He was induced. Endotracheal tube was placed, general anesthesia was administered. His abdomen was then prepped and draped in standard surgical fashion. A timeout was then performed correctly identifying the patient name, operative site and procedure to be performed. A supraumbilical incision was made. It was deepened down to the fascia. The fascia was incised. The peritoneum was entered. Two stay sutures were placed through the peritoneum. The Hasson trocar was placed through the fasciotomy. The abdomen was insufflated.  An 11 mm epigastric and two 5 mm right subcostal trocars were placed in the midclavicular and anterior axillary line. The gallbladder was then lifted over the dome of the liver. The cystic artery and cystic duct were dissected out. There were small areas of patchy necrosis on the gallbladder. On the cystic duct, there was a small amount of bile staining upon our dissection, and I was concerned that I may have inadvertently made a hole in the cystic duct. I  was unable to obtain to milk any bile towards the gallbladder, and though maybe also it was coming from a small area of injury to the gallbladder. I then clipped the duct once to perform cholangiogram, and then it inadvertently became avulsed off the gallbladder. Because I was concerned about the integrity of the duct, I did not want to perform a cholangiogram for fear of causing a tear in duct to progress and, therefore, clipped it again. I  then clipped the cystic artery and ligated it and then took the gallbladder off the gallbladder fossa. There was a small posterior vessel which was encountered which was clipped as well. The gallbladder had some areas of posterior necrosis and peeled in part off the gallbladder fossa, taken out through an Endo Catch bag. I then examined the gallbladder fossa. There were small areas of bleeding which was corrected with hemostasis. Once I was assured the gallbladder fossa was hemostatic, I placed a 19-French JP drain through the lateralmost trocar to ensure that because the duct had torn and appeared to be of poor quality, I wanted ensure that there would be no biloma. This was sutured to the skin with a 3-0 nylon. The gallbladder fossa was examined and again noted to be hemostatic. The abdomen was then desufflated. All trocars were taken out. The supraumbilical fascia was closed with figure-of-eight 0 Vicryl. The skin was then closed with interrupted deep dermal 4-0 Monocryl. Steri-Strips, Telfa gauze and Tegaderm were then placed over the wounds. A sterile dressing was placed over the JP site, and the patient was then awoken, extubated and brought to the postanesthesia care unit. There were no  immediate complications. Needle, sponge, and instrument counts were correct at the end of the procedure.    ____________________________ Jordan Perkins. Jordan Feig, MD cal:dmm D: 07/13/2013 11:59:47 ET T: 07/13/2013 12:25:10 ET JOB#: [id number]  cc: Harrell Gave A. Valen Gillison, MD,  <Dictator> Floyde Parkins MD ELECTRONICALLY SIGNED 07/13/2013 18:27

## 2015-02-14 ENCOUNTER — Other Ambulatory Visit: Payer: Self-pay | Admitting: *Deleted

## 2015-02-14 MED ORDER — RIVAROXABAN 20 MG PO TABS: 20.0000 mg | ORAL_TABLET | Freq: Every day | ORAL | Status: DC

## 2015-02-14 NOTE — Telephone Encounter (Signed)
90 day refill.

## 2015-04-06 ENCOUNTER — Encounter: Payer: Self-pay | Admitting: Cardiovascular Disease

## 2015-04-06 ENCOUNTER — Ambulatory Visit (INDEPENDENT_AMBULATORY_CARE_PROVIDER_SITE_OTHER): Payer: Medicare Other | Admitting: Cardiovascular Disease

## 2015-04-06 VITALS — BP 130/72 | HR 80 | Ht 71.0 in | Wt 202.5 lb

## 2015-04-06 DIAGNOSIS — E785 Hyperlipidemia, unspecified: Secondary | ICD-10-CM

## 2015-04-06 DIAGNOSIS — R0602 Shortness of breath: Secondary | ICD-10-CM | POA: Diagnosis not present

## 2015-04-06 DIAGNOSIS — I482 Chronic atrial fibrillation, unspecified: Secondary | ICD-10-CM

## 2015-04-06 DIAGNOSIS — Z7189 Other specified counseling: Secondary | ICD-10-CM

## 2015-04-06 DIAGNOSIS — Z125 Encounter for screening for malignant neoplasm of prostate: Secondary | ICD-10-CM | POA: Diagnosis not present

## 2015-04-06 DIAGNOSIS — I2510 Atherosclerotic heart disease of native coronary artery without angina pectoris: Secondary | ICD-10-CM

## 2015-04-06 DIAGNOSIS — Z951 Presence of aortocoronary bypass graft: Secondary | ICD-10-CM

## 2015-04-06 NOTE — Assessment & Plan Note (Signed)
No recent labs available. He does not have primary care We have ordered labs and they will be drawn through our office today Goal LDL less than 70

## 2015-04-06 NOTE — Assessment & Plan Note (Signed)
Heart rate well controlled, tolerating anticoagulation No changes to his medications

## 2015-04-06 NOTE — Assessment & Plan Note (Signed)
Currently with no symptoms of angina. No further workup at this time. Continue current medication regimen. We have ordered lab work today

## 2015-04-06 NOTE — Assessment & Plan Note (Signed)
Encouraged him to stay on his xarelto

## 2015-04-06 NOTE — Assessment & Plan Note (Signed)
Currently with no symptoms of angina. No further testing at this time

## 2015-04-06 NOTE — Progress Notes (Signed)
Patient ID: Jordan Perkins, male    DOB: [birth year], 74 y.o.   MRN: [id number]  HPI Comments: Jordan Perkins is a pleasant 74 year old gentleman with hospital admission at the end of September 2014, past medical history of CAD, bypass surgery, stents in 2005, hypertension, hyperlipidemia, atrial fibrillation who presented to the hospital with shortness of breath and chest pain, rapid atrial fibrillation. Heart rate was initially 140s He had CABG in 2005 at [facility] with stents placed 2 months later  hospital admission in 2014, noted to have gallstones. He had a negative Hida scan. Now s/p  Cholecystectomy 11/14 Stress test was performed that showed no ischemia Echocardiogram showed ejection fraction 60-65%, mild to moderate TR, moderately elevated right ventricular systolic pressures He was started on anticoagulation, xarelto 20 mg daily for his atrial fibrillation.   On his last clinic visit, he had stopped anticoagulation, was only on aspirin We have recommended he restart xarelto 20 mg daily No further GI bleeding  He does not have primary care. In general he feels well with no shortness of breath He denies any symptoms concerning for angina, no significant lower extremity edema  no weight change, no GI bleeding  Tolerating his other medications  EKG shows atrial fibrillation with rate 80 bpm, no significant ST or T-wave changes  Other past medical history  CT scan in the hospital of his abdomen and pelvis showed acute pancreatitis with increased fluid density in the peri-pancreatic fat, multiple gallstones, small hiatal hernia   Allergies  Allergen Reactions  . Sulfa Antibiotics     Rash/hives     Current Outpatient Prescriptions on File Prior to Visit  Medication Sig Dispense Refill  . atorvastatin (LIPITOR) 80 MG tablet Take 1 tablet (80 mg total) by mouth daily. 90 tablet 3  . enalapril (VASOTEC) 20 MG tablet Take 1 tablet (20 mg total) by mouth daily. 90 tablet 3  .  hydrochlorothiazide (HYDRODIURIL) 25 MG tablet Take 1 tablet (25 mg total) by mouth daily. 90 tablet 3  . metoprolol (LOPRESSOR) 100 MG tablet Take 1 tablet (100 mg total) by mouth 2 (two) times daily. 180 tablet 3  . rivaroxaban (XARELTO) 20 MG TABS tablet Take 1 tablet (20 mg total) by mouth daily. 90 tablet 3   No current facility-administered medications on file prior to visit.    Past Medical History  Diagnosis Date  . Coronary artery disease   . Hyperlipidemia   . Hypertension   . A-fib     Past Surgical History  Procedure Laterality Date  . Cardiac catheterization    . Coronary artery bypass graft  04-24-2004    CABG x 3 [facility]  . Coronary angioplasty  06/2004    s/p stent placement @ [facility]  . Cholecystectomy      Social History  reports that he has never smoked. He does not have any smokeless tobacco history on file. He reports that he does not drink alcohol or use illicit drugs.  Family History family history includes Heart disease in his father and paternal uncle.     Review of Systems  Constitutional: Negative.   Respiratory: Negative.   Cardiovascular: Negative.   Gastrointestinal: Negative.   Musculoskeletal: Negative.   Neurological: Negative.   Hematological: Negative.   All other systems reviewed and are negative.   BP 130/72 mmHg  Pulse 80  Ht 5\' 11"  (1.803 m)  Wt 202 lb 8 oz (91.853 kg)  BMI 28.26 kg/m2  Physical Exam  Constitutional: He is oriented  to person, place, and time. He appears well-developed and well-nourished.  HENT:  Head: Normocephalic.  Nose: Nose normal.  Mouth/Throat: Oropharynx is clear and moist.  Eyes: Conjunctivae are normal. Pupils are equal, round, and reactive to light.  Neck: Normal range of motion. Neck supple. No JVD present.  Cardiovascular: Normal rate, S1 normal, S2 normal, normal heart sounds and intact distal pulses.  An irregularly irregular rhythm present. Exam reveals no gallop and no friction rub.   No murmur  heard. Pulmonary/Chest: Effort normal and breath sounds normal. No respiratory distress. He has no wheezes. He has no rales. He exhibits no tenderness.  Abdominal: Soft. Bowel sounds are normal. He exhibits no distension. There is no tenderness.  Musculoskeletal: Normal range of motion. He exhibits no edema or tenderness.  Lymphadenopathy:    He has no cervical adenopathy.  Neurological: He is alert and oriented to person, place, and time. Coordination normal.  Skin: Skin is warm and dry. No rash noted. No erythema.  Psychiatric: He has a normal mood and affect. His behavior is normal. Judgment and thought content normal.      Assessment and Plan   Nursing note and vitals reviewed.

## 2015-04-06 NOTE — Patient Instructions (Signed)
You are doing well. No medication changes were made.  We will check labs today  Please call us if you have new issues that need to be addressed before your next appt.  Your physician wants you to follow-up in: 12 months.  You will receive a reminder letter in the mail two months in advance. If you don't receive a letter, please call our office to schedule the follow-up appointment.

## 2015-04-07 LAB — LIPID PANEL
CHOL/HDL RATIO: 2.2 ratio (ref 0.0–5.0)
Cholesterol, Total: 89 mg/dL — ABNORMAL LOW (ref 100–199)
HDL: 40 mg/dL (ref >39)
LDL Calculated: 36 mg/dL (ref 0–99)
Triglycerides: 67 mg/dL (ref 0–149)
VLDL CHOLESTEROL CAL: 13 mg/dL (ref 5–40)

## 2015-04-07 LAB — COMPREHENSIVE METABOLIC PANEL
ALBUMIN: 4.6 g/dL (ref 3.5–4.8)
ALT: 24 IU/L (ref 0–44)
AST: 25 IU/L (ref 0–40)
Albumin/Globulin Ratio: 1.7 (ref 1.1–2.5)
Alkaline Phosphatase: 60 IU/L (ref 39–117)
BILIRUBIN TOTAL: 1.1 mg/dL (ref 0.0–1.2)
BUN / CREAT RATIO: 13 (ref 10–22)
BUN: 17 mg/dL (ref 8–27)
CO2: 23 mmol/L (ref 18–29)
CREATININE: 1.31 mg/dL — AB (ref 0.76–1.27)
Calcium: 9.5 mg/dL (ref 8.6–10.2)
Chloride: 99 mmol/L (ref 97–108)
GFR, EST AFRICAN AMERICAN: 63 mL/min/{1.73_m2} (ref >59)
GFR, EST NON AFRICAN AMERICAN: 54 mL/min/{1.73_m2} — AB (ref >59)
Globulin, Total: 2.7 g/dL (ref 1.5–4.5)
Glucose: 123 mg/dL — ABNORMAL HIGH (ref 65–99)
POTASSIUM: 5 mmol/L (ref 3.5–5.2)
Sodium: 140 mmol/L (ref 134–144)
TOTAL PROTEIN: 7.3 g/dL (ref 6.0–8.5)

## 2015-04-07 LAB — CBC WITH DIFFERENTIAL/PLATELET
BASOS ABS: 0 10*3/uL (ref 0.0–0.2)
BASOS: 0 %
EOS (ABSOLUTE): 0.2 10*3/uL (ref 0.0–0.4)
Eos: 2 %
HEMATOCRIT: 38.9 % (ref 37.5–51.0)
Hemoglobin: 13.3 g/dL (ref 12.6–17.7)
Immature Grans (Abs): 0 10*3/uL (ref 0.0–0.1)
Immature Granulocytes: 0 %
LYMPHS ABS: 1.9 10*3/uL (ref 0.7–3.1)
Lymphs: 17 %
MCH: 33 pg (ref 26.6–33.0)
MCHC: 34.2 g/dL (ref 31.5–35.7)
MCV: 97 fL (ref 79–97)
MONOCYTES: 6 %
Monocytes Absolute: 0.7 10*3/uL (ref 0.1–0.9)
NEUTROS PCT: 75 %
Neutrophils Absolute: 8 10*3/uL — ABNORMAL HIGH (ref 1.4–7.0)
PLATELETS: 158 10*3/uL (ref 150–379)
RBC: 4.03 x10E6/uL — AB (ref 4.14–5.80)
RDW: 14.1 % (ref 12.3–15.4)
WBC: 10.8 10*3/uL (ref 3.4–10.8)

## 2015-04-07 LAB — PSA: Prostate Specific Ag, Serum: 1 ng/mL (ref 0.0–4.0)

## 2015-04-07 LAB — HEPATIC FUNCTION PANEL: Bilirubin, Direct: 0.31 mg/dL (ref 0.00–0.40)

## 2015-07-23 ENCOUNTER — Other Ambulatory Visit: Payer: Self-pay | Admitting: Cardiovascular Disease

## 2015-07-27 ENCOUNTER — Other Ambulatory Visit: Payer: Self-pay

## 2015-07-27 MED ORDER — RIVAROXABAN 20 MG PO TABS: 20.0000 mg | ORAL_TABLET | Freq: Every day | ORAL | Status: DC

## 2015-07-27 MED ORDER — METOPROLOL TARTRATE 100 MG PO TABS
ORAL_TABLET | ORAL | Status: DC
Start: 2015-07-27 — End: 2016-07-12

## 2015-07-27 MED ORDER — ENALAPRIL MALEATE 20 MG PO TABS: 20.0000 mg | ORAL_TABLET | Freq: Every day | ORAL | Status: DC

## 2015-07-27 MED ORDER — ATORVASTATIN CALCIUM 80 MG PO TABS: 80.0000 mg | ORAL_TABLET | Freq: Every day | ORAL | Status: DC

## 2015-07-27 MED ORDER — HYDROCHLOROTHIAZIDE 25 MG PO TABS: 25.0000 mg | ORAL_TABLET | Freq: Every day | ORAL | Status: DC

## 2015-07-27 NOTE — Telephone Encounter (Signed)
90 day supply

## 2015-08-09 ENCOUNTER — Other Ambulatory Visit: Payer: Self-pay | Admitting: Cardiovascular Disease

## 2015-08-23 ENCOUNTER — Other Ambulatory Visit: Payer: Self-pay | Admitting: Cardiovascular Disease

## 2016-07-12 ENCOUNTER — Encounter: Payer: Self-pay | Admitting: Cardiovascular Disease

## 2016-07-12 ENCOUNTER — Ambulatory Visit (INDEPENDENT_AMBULATORY_CARE_PROVIDER_SITE_OTHER): Payer: Medicare Other | Admitting: Cardiovascular Disease

## 2016-07-12 VITALS — BP 160/70 | HR 56 | Ht 71.0 in | Wt 197.8 lb

## 2016-07-12 DIAGNOSIS — R0989 Other specified symptoms and signs involving the circulatory and respiratory systems: Secondary | ICD-10-CM | POA: Diagnosis not present

## 2016-07-12 DIAGNOSIS — I2510 Atherosclerotic heart disease of native coronary artery without angina pectoris: Secondary | ICD-10-CM

## 2016-07-12 DIAGNOSIS — I10 Essential (primary) hypertension: Secondary | ICD-10-CM

## 2016-07-12 DIAGNOSIS — E7800 Pure hypercholesterolemia, unspecified: Secondary | ICD-10-CM

## 2016-07-12 DIAGNOSIS — I482 Chronic atrial fibrillation, unspecified: Secondary | ICD-10-CM

## 2016-07-12 DIAGNOSIS — Z951 Presence of aortocoronary bypass graft: Secondary | ICD-10-CM

## 2016-07-12 DIAGNOSIS — Z23 Encounter for immunization: Secondary | ICD-10-CM

## 2016-07-12 MED ORDER — ATORVASTATIN CALCIUM 80 MG PO TABS: 80.0000 mg | ORAL_TABLET | Freq: Every day | ORAL | 3 refills | Status: DC

## 2016-07-12 MED ORDER — ENALAPRIL MALEATE 20 MG PO TABS: 20.0000 mg | ORAL_TABLET | Freq: Every day | ORAL | 3 refills | Status: DC

## 2016-07-12 MED ORDER — RIVAROXABAN 20 MG PO TABS: 20.0000 mg | ORAL_TABLET | Freq: Every day | ORAL | 3 refills | Status: DC

## 2016-07-12 MED ORDER — HYDROCHLOROTHIAZIDE 25 MG PO TABS: 25.0000 mg | ORAL_TABLET | Freq: Every day | ORAL | 3 refills | Status: DC

## 2016-07-12 MED ORDER — METOPROLOL TARTRATE 100 MG PO TABS: ORAL_TABLET | ORAL | 3 refills | Status: DC

## 2016-07-12 NOTE — Progress Notes (Signed)
Cardiology Office Note  Date:  07/12/2016   ID:  Jordan Perkins, DOB 04-30-1941, MRN [id number]  PCP:  No PCP Per Patient   Chief Complaint  Patient presents with  . Follow-up    12  month    HPI:  [name] is a pleasant 75 year old gentleman with hospital admission at the end of September 2014, past medical history of CAD, bypass surgery, stents in 2005, hypertension, hyperlipidemia, atrial fibrillation who presented to the hospital with shortness of breath and chest pain, rapid atrial fibrillation. Heart rate was initially 140s He had CABG in 2005 at [facility] with stents placed 2 months later  hospital admission in 2014, noted to have gallstones. He had a negative Hida scan. Now s/p  Cholecystectomy 11/14 Stress test was performed that showed no ischemia Echocardiogram showed ejection fraction 60-65%, mild to moderate TR, moderately elevated right ventricular systolic pressures He was started on anticoagulation, xarelto 20 mg daily for his atrial fibrillation.   In follow-up he reports that he is doing well, Tolerating anticoagulation, On xarelto 20 mg daily No GI bleeding Denies any shortness breath, chest pain on exertion no complaints  He does not have primary care.  EKG shows atrial fibrillation with rate 56 bpm, no significant ST or T-wave changes  Other past medical history  CT scan in the hospital of his abdomen and pelvis showed acute pancreatitis with increased fluid density in the peri-pancreatic fat, multiple gallstones, small hiatal hernia  PMH:   has a past medical history of A-fib ([place]); Coronary artery disease; Hyperlipidemia; and Hypertension.  PSH:    Past Surgical History:  Procedure Laterality Date  . CARDIAC CATHETERIZATION    . CHOLECYSTECTOMY    . CORONARY ANGIOPLASTY  06/2004   s/p stent placement @ [facility]  . CORONARY ARTERY BYPASS GRAFT  04-24-2004   CABG x 3 [facility]    Current Outpatient Prescriptions  Medication Sig Dispense Refill  . atorvastatin  (LIPITOR) 80 MG tablet Take 1 tablet (80 mg total) by mouth daily. 90 tablet 3  . enalapril (VASOTEC) 20 MG tablet Take 1 tablet (20 mg total) by mouth daily. 90 tablet 3  . hydrochlorothiazide (HYDRODIURIL) 25 MG tablet Take 1 tablet (25 mg total) by mouth daily. 90 tablet 3  . metoprolol (LOPRESSOR) 100 MG tablet TAKE 1 TABLET (100 MG TOTAL) BY MOUTH 2 (TWO) TIMES DAILY. 180 tablet 3  . rivaroxaban (XARELTO) 20 MG TABS tablet Take 1 tablet (20 mg total) by mouth daily. 90 tablet 3   No current facility-administered medications for this visit.      Allergies:   Sulfa antibiotics   Social History:  The patient  reports that he has never smoked. He does not have any smokeless tobacco history on file. He reports that he does not drink alcohol or use drugs.   Family History:   family history includes Heart attack in his brother; Heart disease in his father and paternal uncle.    Review of Systems: Review of Systems  Constitutional: Negative.   Respiratory: Negative.   Cardiovascular: Negative.   Gastrointestinal: Negative.   Musculoskeletal: Negative.   Neurological: Negative.   Psychiatric/Behavioral: Negative.   All other systems reviewed and are negative.    PHYSICAL EXAM: VS:  BP (!) 160/70   Pulse (!) 56   Ht 5\' 11"  (1.803 m)   Wt 197 lb 12.8 oz (89.7 kg)   BMI 27.59 kg/m  , BMI Body mass index is 27.59 kg/m. GEN: Well nourished, well developed,  in no acute distress  HEENT: normal  Neck: no JVD,2+ left side  carotid bruits, no masses Cardiac: Irregularly irregular, no murmurs, rubs, or gallops,no edema  Respiratory:  clear to auscultation bilaterally, normal work of breathing GI: soft, nontender, nondistended, + BS MS: no deformity or atrophy  Skin: warm and dry, no rash Neuro:  Strength and sensation are intact Psych: euthymic mood, full affect    Recent Labs: No results found for requested labs within last 8760 hours.    Lipid Panel Lab Results  Component  Value Date   CHOL 89 (L) 04/06/2015   HDL 40 04/06/2015   LDLCALC 36 04/06/2015   TRIG 67 04/06/2015      Wt Readings from Last 3 Encounters:  07/12/16 197 lb 12.8 oz (89.7 kg)  04/06/15 202 lb 8 oz (91.9 kg)  07/21/14 203 lb 12 oz (92.4 kg)       ASSESSMENT AND PLAN:  Left carotid bruit We have ordered carotid ultrasound given his bruit  Coronary artery disease involving native coronary artery of native heart without angina pectoris Currently with no symptoms of angina. No further workup at this time. Continue current medication regimen.  S/P CABG (coronary artery bypass graft) Currently with no symptoms of angina  Essential hypertension Blood pressure elevated on arrival, recommended he monitor blood pressure at home he reports typically blood pressure well controlled. No changes made to the medications.  Pure hypercholesterolemia We have placed an order to have lab work done at his convenience, fasting  Chronic atrial fibrillation ([place]) Asymptomatic, rate relatively well-controlled on metoprolol  Spent some time talking about the need to establish with primary care. We will make an appointment for him  Total encounter time more than 25 minutes  Greater than 50% was spent in counseling and coordination of care with the patient   Disposition:   F/U  6 months  No orders of the defined types were placed in this encounter.    Signed, Esmond Plants, M.D., Ph.D. 07/12/2016  [place], [place]

## 2016-07-12 NOTE — Patient Instructions (Addendum)
We will help schedule an appt with primary care  Medication Instructions:   No medication changes made  Labwork:  We will schedule labs: fasting LFTS, lipids and BMP  Testing/Procedures:  Carotid ultrasound for carotid bruit   Follow-Up: It was a pleasure seeing you in the office today. Please call us if you have new issues that need to be addressed before your next appt.  [phone]  Your physician wants you to follow-up in: 12 months.  You will receive a reminder letter in the mail two months in advance. If you don't receive a letter, please call our office to schedule the follow-up appointment.  If you need a refill on your cardiac medications before your next appointment, please call your pharmacy.

## 2016-07-17 ENCOUNTER — Other Ambulatory Visit: Payer: Medicare Other

## 2016-07-25 ENCOUNTER — Ambulatory Visit: Payer: Medicare Other

## 2016-07-25 DIAGNOSIS — R0989 Other specified symptoms and signs involving the circulatory and respiratory systems: Secondary | ICD-10-CM

## 2016-07-25 DIAGNOSIS — E7800 Pure hypercholesterolemia, unspecified: Secondary | ICD-10-CM

## 2016-07-25 DIAGNOSIS — I10 Essential (primary) hypertension: Secondary | ICD-10-CM

## 2016-07-25 DIAGNOSIS — I2510 Atherosclerotic heart disease of native coronary artery without angina pectoris: Secondary | ICD-10-CM

## 2016-07-25 DIAGNOSIS — Z951 Presence of aortocoronary bypass graft: Secondary | ICD-10-CM

## 2016-07-25 DIAGNOSIS — I482 Chronic atrial fibrillation, unspecified: Secondary | ICD-10-CM

## 2016-07-30 ENCOUNTER — Other Ambulatory Visit: Payer: Self-pay | Admitting: *Deleted

## 2016-07-30 DIAGNOSIS — E7849 Other hyperlipidemia: Secondary | ICD-10-CM

## 2016-07-30 DIAGNOSIS — I4891 Unspecified atrial fibrillation: Secondary | ICD-10-CM

## 2016-07-30 DIAGNOSIS — Z951 Presence of aortocoronary bypass graft: Secondary | ICD-10-CM

## 2016-08-28 ENCOUNTER — Ambulatory Visit: Payer: Medicare Other | Admitting: Family Medicine

## 2016-10-29 ENCOUNTER — Inpatient Hospital Stay
Admission: EM | Admit: 2016-10-29 | Discharge: 2016-11-01 | DRG: 641 | Disposition: A | Discharge status: Home / Self Care | Payer: Medicare Other | Attending: Internal Medicine | Admitting: Internal Medicine

## 2016-10-29 ENCOUNTER — Emergency Department: Payer: Medicare Other

## 2016-10-29 ENCOUNTER — Encounter: Payer: Self-pay | Admitting: Emergency Medicine

## 2016-10-29 DIAGNOSIS — R17 Unspecified jaundice: Secondary | ICD-10-CM | POA: Diagnosis not present

## 2016-10-29 DIAGNOSIS — Z79899 Other long term (current) drug therapy: Secondary | ICD-10-CM

## 2016-10-29 DIAGNOSIS — Z9049 Acquired absence of other specified parts of digestive tract: Secondary | ICD-10-CM

## 2016-10-29 DIAGNOSIS — Z7901 Long term (current) use of anticoagulants: Secondary | ICD-10-CM

## 2016-10-29 DIAGNOSIS — I4891 Unspecified atrial fibrillation: Secondary | ICD-10-CM | POA: Diagnosis not present

## 2016-10-29 DIAGNOSIS — I10 Essential (primary) hypertension: Secondary | ICD-10-CM | POA: Diagnosis not present

## 2016-10-29 DIAGNOSIS — E861 Hypovolemia: Secondary | ICD-10-CM | POA: Diagnosis present

## 2016-10-29 DIAGNOSIS — R531 Weakness: Secondary | ICD-10-CM

## 2016-10-29 DIAGNOSIS — Z9861 Coronary angioplasty status: Secondary | ICD-10-CM | POA: Diagnosis not present

## 2016-10-29 DIAGNOSIS — R112 Nausea with vomiting, unspecified: Secondary | ICD-10-CM | POA: Diagnosis not present

## 2016-10-29 DIAGNOSIS — R05 Cough: Secondary | ICD-10-CM | POA: Diagnosis not present

## 2016-10-29 DIAGNOSIS — I2510 Atherosclerotic heart disease of native coronary artery without angina pectoris: Secondary | ICD-10-CM | POA: Diagnosis present

## 2016-10-29 DIAGNOSIS — E785 Hyperlipidemia, unspecified: Secondary | ICD-10-CM | POA: Diagnosis present

## 2016-10-29 DIAGNOSIS — E871 Hypo-osmolality and hyponatremia: Secondary | ICD-10-CM | POA: Diagnosis not present

## 2016-10-29 DIAGNOSIS — K529 Noninfective gastroenteritis and colitis, unspecified: Secondary | ICD-10-CM | POA: Diagnosis not present

## 2016-10-29 DIAGNOSIS — Z882 Allergy status to sulfonamides status: Secondary | ICD-10-CM

## 2016-10-29 DIAGNOSIS — R935 Abnormal findings on diagnostic imaging of other abdominal regions, including retroperitoneum: Secondary | ICD-10-CM | POA: Diagnosis not present

## 2016-10-29 DIAGNOSIS — Z7982 Long term (current) use of aspirin: Secondary | ICD-10-CM | POA: Diagnosis not present

## 2016-10-29 DIAGNOSIS — Z23 Encounter for immunization: Secondary | ICD-10-CM

## 2016-10-29 DIAGNOSIS — Z951 Presence of aortocoronary bypass graft: Secondary | ICD-10-CM

## 2016-10-29 DIAGNOSIS — Z8249 Family history of ischemic heart disease and other diseases of the circulatory system: Secondary | ICD-10-CM

## 2016-10-29 DIAGNOSIS — I482 Chronic atrial fibrillation: Secondary | ICD-10-CM | POA: Diagnosis present

## 2016-10-29 DIAGNOSIS — R110 Nausea: Secondary | ICD-10-CM | POA: Diagnosis not present

## 2016-10-29 LAB — BASIC METABOLIC PANEL
ANION GAP: 10 (ref 5–15)
ANION GAP: 8 (ref 5–15)
Anion gap: 9 (ref 5–15)
BUN: 26 mg/dL — AB (ref 6–20)
BUN: 25 mg/dL — ABNORMAL HIGH (ref 6–20)
BUN: 23 mg/dL — ABNORMAL HIGH (ref 6–20)
CALCIUM: 8.8 mg/dL — AB (ref 8.9–10.3)
CALCIUM: 8.9 mg/dL (ref 8.9–10.3)
CALCIUM: 8.4 mg/dL — AB (ref 8.9–10.3)
CO2: 25 mmol/L (ref 22–32)
CO2: 25 mmol/L (ref 22–32)
CO2: 25 mmol/L (ref 22–32)
CREATININE: 1.24 mg/dL (ref 0.61–1.24)
CREATININE: 1.27 mg/dL — AB (ref 0.61–1.24)
Chloride: 92 mmol/L — ABNORMAL LOW (ref 101–111)
Chloride: 93 mmol/L — ABNORMAL LOW (ref 101–111)
Chloride: 94 mmol/L — ABNORMAL LOW (ref 101–111)
Creatinine, Ser: 1.11 mg/dL (ref 0.61–1.24)
GFR calc Af Amer: >60 mL/min (ref >60)
GFR, EST NON AFRICAN AMERICAN: 56 mL/min — AB (ref >60)
GFR, EST NON AFRICAN AMERICAN: 54 mL/min — AB (ref >60)
GLUCOSE: 111 mg/dL — AB (ref 65–99)
GLUCOSE: 110 mg/dL — AB (ref 65–99)
GLUCOSE: 110 mg/dL — AB (ref 65–99)
POTASSIUM: 3.5 mmol/L (ref 3.5–5.1)
Potassium: 3.4 mmol/L — ABNORMAL LOW (ref 3.5–5.1)
Potassium: 3.2 mmol/L — ABNORMAL LOW (ref 3.5–5.1)
Sodium: 126 mmol/L — ABNORMAL LOW (ref 135–145)
Sodium: 128 mmol/L — ABNORMAL LOW (ref 135–145)
Sodium: 127 mmol/L — ABNORMAL LOW (ref 135–145)

## 2016-10-29 LAB — COMPREHENSIVE METABOLIC PANEL
ALT: 26 U/L (ref 17–63)
AST: 35 U/L (ref 15–41)
Albumin: 4.1 g/dL (ref 3.5–5.0)
Alkaline Phosphatase: 54 U/L (ref 38–126)
Anion gap: 11 (ref 5–15)
BILIRUBIN TOTAL: 4.1 mg/dL — AB (ref 0.3–1.2)
BUN: 26 mg/dL — AB (ref 6–20)
CHLORIDE: 90 mmol/L — AB (ref 101–111)
CO2: 25 mmol/L (ref 22–32)
CREATININE: 1.39 mg/dL — AB (ref 0.61–1.24)
Calcium: 9.1 mg/dL (ref 8.9–10.3)
GFR calc Af Amer: 56 mL/min — ABNORMAL LOW (ref >60)
GFR, EST NON AFRICAN AMERICAN: 49 mL/min — AB (ref >60)
Glucose, Bld: 124 mg/dL — ABNORMAL HIGH (ref 65–99)
Potassium: 3.4 mmol/L — ABNORMAL LOW (ref 3.5–5.1)
Sodium: 126 mmol/L — ABNORMAL LOW (ref 135–145)
Total Protein: 7.5 g/dL (ref 6.5–8.1)

## 2016-10-29 LAB — URINALYSIS, COMPLETE (UACMP) WITH MICROSCOPIC
BILIRUBIN URINE: NEGATIVE
Bacteria, UA: NONE SEEN
Glucose, UA: NEGATIVE mg/dL
Hgb urine dipstick: NEGATIVE
Ketones, ur: 5 mg/dL — AB
Leukocytes, UA: NEGATIVE
NITRITE: NEGATIVE
PROTEIN: NEGATIVE mg/dL
SPECIFIC GRAVITY, URINE: 1.011 (ref 1.005–1.030)
pH: 6 (ref 5.0–8.0)

## 2016-10-29 LAB — CBC
HCT: 36.6 % — ABNORMAL LOW (ref 40.0–52.0)
HEMOGLOBIN: 13 g/dL (ref 13.0–18.0)
MCH: 34.1 pg — AB (ref 26.0–34.0)
MCHC: 35.5 g/dL (ref 32.0–36.0)
MCV: 96.2 fL (ref 80.0–100.0)
Platelets: 162 10*3/uL (ref 150–440)
RBC: 3.81 MIL/uL — AB (ref 4.40–5.90)
RDW: 13.9 % (ref 11.5–14.5)
WBC: 10.2 10*3/uL (ref 3.8–10.6)

## 2016-10-29 LAB — INFLUENZA PANEL BY PCR (TYPE A & B)
INFLAPCR: NEGATIVE
Influenza B By PCR: NEGATIVE

## 2016-10-29 LAB — LIPASE, BLOOD: LIPASE: 56 U/L — AB (ref 11–51)

## 2016-10-29 LAB — TROPONIN I: Troponin I: <0.03 ng/mL (ref <0.03)

## 2016-10-29 LAB — BILIRUBIN, DIRECT: Bilirubin, Direct: 0.4 mg/dL (ref 0.1–0.5)

## 2016-10-29 MED ORDER — RIVAROXABAN 20 MG PO TABS
20.0000 mg | ORAL_TABLET | Freq: Every day | ORAL | Status: DC
  Administered 2016-10-29 – 2016-11-01 (×4): 20 mg via ORAL
  Filled 2016-10-29 (×4): qty 1

## 2016-10-29 MED ORDER — METOPROLOL TARTRATE 50 MG PO TABS
100.0000 mg | ORAL_TABLET | Freq: Two times a day (BID) | ORAL | Status: DC
  Administered 2016-11-01: 100 mg via ORAL
  Filled 2016-10-29 (×2): qty 2

## 2016-10-29 MED ORDER — ENALAPRIL MALEATE 10 MG PO TABS
20.0000 mg | ORAL_TABLET | Freq: Every day | ORAL | Status: DC
  Administered 2016-10-29 – 2016-11-01 (×4): 20 mg via ORAL
  Filled 2016-10-29 (×4): qty 2

## 2016-10-29 MED ORDER — DOCUSATE SODIUM 100 MG PO CAPS
100.0000 mg | ORAL_CAPSULE | Freq: Two times a day (BID) | ORAL | Status: DC
  Administered 2016-10-29 – 2016-11-01 (×5): 100 mg via ORAL
  Filled 2016-10-29 (×5): qty 1

## 2016-10-29 MED ORDER — ASPIRIN EC 81 MG PO TBEC
81.0000 mg | DELAYED_RELEASE_TABLET | Freq: Every day | ORAL | Status: DC
  Administered 2016-10-29 – 2016-11-01 (×4): 81 mg via ORAL
  Filled 2016-10-29 (×4): qty 1

## 2016-10-29 MED ORDER — ONDANSETRON HCL 4 MG/2ML IJ SOLN
4.0000 mg | Freq: Four times a day (QID) | INTRAMUSCULAR | Status: DC | PRN
  Administered 2016-10-29 – 2016-10-31 (×5): 4 mg via INTRAVENOUS
  Filled 2016-10-29 (×5): qty 2

## 2016-10-29 MED ORDER — ACETAMINOPHEN 650 MG RE SUPP
650.0000 mg | Freq: Four times a day (QID) | RECTAL | Status: DC | PRN
Start: 2016-10-29 — End: 2016-11-01

## 2016-10-29 MED ORDER — TRAZODONE HCL 50 MG PO TABS
25.0000 mg | ORAL_TABLET | Freq: Every evening | ORAL | Status: DC | PRN
  Administered 2016-10-29 – 2016-10-31 (×2): 25 mg via ORAL
  Filled 2016-10-29 (×2): qty 1

## 2016-10-29 MED ORDER — SODIUM CHLORIDE 0.9 % IV BOLUS (SEPSIS)
500.0000 mL | Freq: Once | INTRAVENOUS | Status: AC
  Administered 2016-10-29: 500 mL via INTRAVENOUS

## 2016-10-29 MED ORDER — SODIUM CHLORIDE 0.9 % IV SOLN
INTRAVENOUS | Status: DC
  Administered 2016-10-29 – 2016-10-31 (×3): via INTRAVENOUS

## 2016-10-29 MED ORDER — ONDANSETRON HCL 4 MG PO TABS: 4.0000 mg | ORAL_TABLET | Freq: Four times a day (QID) | ORAL | Status: DC | PRN

## 2016-10-29 MED ORDER — BISACODYL 5 MG PO TBEC
5.0000 mg | DELAYED_RELEASE_TABLET | Freq: Every day | ORAL | Status: DC | PRN
  Filled 2016-10-29: qty 1

## 2016-10-29 MED ORDER — ACETAMINOPHEN 325 MG PO TABS
650.0000 mg | ORAL_TABLET | Freq: Four times a day (QID) | ORAL | Status: DC | PRN
Start: 2016-10-29 — End: 2016-11-01
  Administered 2016-10-31 – 2016-11-01 (×2): 650 mg via ORAL
  Filled 2016-10-29 (×2): qty 2

## 2016-10-29 NOTE — H&P (Signed)
[place] [facility] [place] NAME: Jordan Perkins    MR#:  [id number]  DATE OF BIRTH:  1941-01-31  DATE OF ADMISSION:  10/29/2016  PRIMARY CARE PHYSICIAN: No PCP Per Patient   REQUESTING/REFERRING PHYSICIAN: Dr.Malinda  CHIEF COMPLAINT: Generalized weakness,.   Chief Complaint  Patient presents with  . Weakness    HISTORY OF PRESENT ILLNESS:  Jordan Perkins  is a 76 y.o. male with a known history of Essential hypertension, coronary artery disease status post CABG comes in with generalized weakness, cough, nausea for about 1 week. Has poor by mouth intake for 1 week. No abdominal pain but because of the cough and having pain in the abdomen when he coughs. No diarrhea. Patient has no PCP follows up with Dr. Esmond Plants  for history of heart problems.  PAST MEDICAL HISTORY:   Past Medical History:  Diagnosis Date  . A-fib ([place])   . Coronary artery disease   . Hyperlipidemia   . Hypertension     PAST SURGICAL HISTOIRY:   Past Surgical History:  Procedure Laterality Date  . CARDIAC CATHETERIZATION    . CHOLECYSTECTOMY    . CORONARY ANGIOPLASTY  06/2004   s/p stent placement @ [facility]  . CORONARY ARTERY BYPASS GRAFT  04-24-2004   CABG x 3 [facility]    SOCIAL HISTORY:   Social History  Substance Use Topics  . Smoking status: Never Smoker  . Smokeless tobacco: Not on file  . Alcohol use No    FAMILY HISTORY:   Family History  Problem Relation Age of Onset  . Heart disease Father   . Heart disease Paternal Uncle   . Heart attack Brother     DRUG ALLERGIES:   Allergies  Allergen Reactions  . Sulfa Antibiotics Rash    Rash/hives     REVIEW OF SYSTEMS:  CONSTITUTIONAL: No fever,Patient has fatigue, generalized weakness. EYES: No blurred or double vision.  EARS, NOSE, AND THROAT: No tinnitus or ear pain.  RESPIRATORY: Has some cough for about 1 week. shortness of breath, wheezing or hemoptysis.  CARDIOVASCULAR: No chest pain,  orthopnea, edema.  GASTROINTESTINAL: Has nausea and poor by mouth intake. No diarrhea. No abdominal pain.  GENITOURINARY: No dysuria, hematuria.  ENDOCRINE: No polyuria, nocturia,  HEMATOLOGY: No anemia, easy bruising or bleeding SKIN: No rash or lesion. MUSCULOSKELETAL: No joint pain or arthritis.   NEUROLOGIC: No tingling, numbness, weakness.  PSYCHIATRY: No anxiety or depression.   MEDICATIONS AT HOME:   Prior to Admission medications   Medication Sig Start Date End Date Taking? Authorizing Provider  aspirin EC 81 MG tablet Take 81 mg by mouth daily as needed.   Yes Historical Provider, MD  atorvastatin (LIPITOR) 80 MG tablet Take 1 tablet (80 mg total) by mouth daily. 07/12/16  Yes [name], MD  enalapril (VASOTEC) 20 MG tablet Take 1 tablet (20 mg total) by mouth daily. 07/12/16  Yes [name], MD  hydrochlorothiazide (HYDRODIURIL) 25 MG tablet Take 1 tablet (25 mg total) by mouth daily. 07/12/16  Yes [name], MD  metoprolol (LOPRESSOR) 100 MG tablet TAKE 1 TABLET (100 MG TOTAL) BY MOUTH 2 (TWO) TIMES DAILY. 07/12/16  Yes [name], MD  Multiple Vitamin (MULTIVITAMIN) tablet Take 1 tablet by mouth daily.   Yes Historical Provider, MD  rivaroxaban (XARELTO) 20 MG TABS tablet Take 1 tablet (20 mg total) by mouth daily. 07/12/16  Yes [name], MD  VITAL SIGNS:  Blood pressure (!) 182/64, pulse 66, temperature 99.5 F (37.5 C), temperature source Oral, resp. rate 20, height 5\' 11"  (1.803 m), weight 93.4 kg (206 lb), SpO2 100 %.  PHYSICAL EXAMINATION:  GENERAL:  76 y.o.-year-old patient lying in the bed with no acute distress.  EYES: Pupils equal, round, reactive to light and accommodation. No scleral icterus. Extraocular muscles intact.  HEENT: Head atraumatic, normocephalic. Oropharynx and nasopharynx clear.  NECK:  Supple, no jugular venous distention. No thyroid enlargement, no tenderness.  LUNGS: Normal breath sounds bilaterally, no  wheezing, rales,rhonchi or crepitation. No use of accessory muscles of respiration.  CARDIOVASCULAR: S1, S2 normal. No murmurs, rubs, or gallops.  ABDOMEN: Soft, nontender, nondistended. Bowel sounds present. No organomegaly or mass.  EXTREMITIES: No pedal edema, cyanosis, or clubbing.  NEUROLOGIC: Cranial nerves II through XII are intact. Muscle strength 5/5 in all extremities. Sensation intact. Gait not checked.  PSYCHIATRIC: The patient is alert and oriented x 3.  SKIN: No obvious rash, lesion, or ulcer.   LABORATORY PANEL:   CBC  Recent Labs Lab 10/29/16 1253  WBC 10.2  HGB 13.0  HCT 36.6*  PLT 162   ------------------------------------------------------------------------------------------------------------------  Chemistries   Recent Labs Lab 10/29/16 1253 10/29/16 1605  NA 126* 126*  K 3.4* 3.5  CL 90* 92*  CO2 25 25  GLUCOSE 124* 111*  BUN 26* 26*  CREATININE 1.39* 1.24  CALCIUM 9.1 8.8*  AST 35  --   ALT 26  --   ALKPHOS 54  --   BILITOT 4.1*  --    ------------------------------------------------------------------------------------------------------------------  Cardiac Enzymes  Recent Labs Lab 10/29/16 1253  TROPONINI <0.03   ------------------------------------------------------------------------------------------------------------------  RADIOLOGY:  Dg Chest Portable 1 View  Result Date: 10/29/2016 CLINICAL DATA:  Generalized weakness with cough for 2 weeks EXAM: PORTABLE CHEST 1 VIEW COMPARISON:  06/06/2013 FINDINGS: Normal heart size. Status post CABG. Borderline hyperinflation. There is no edema, consolidation, effusion, or pneumothorax. IMPRESSION: No evidence of active disease. Electronically Signed   By: Monte Fantasia M.D.   On: 10/29/2016 13:11   US Abdomen Limited Ruq  Result Date: 10/29/2016 CLINICAL DATA:  Elevated bilirubin EXAM: US ABDOMEN LIMITED - RIGHT UPPER QUADRANT COMPARISON:  06/11/2013 FINDINGS: Gallbladder: Previous  cholecystectomy. Common bile duct: Diameter: 4.7 mm Liver: No focal lesion identified. Within normal limits in parenchymal echogenicity. IMPRESSION: No acute findings and no evidence for biliary dilatation. Suggest further investigation with contrast enhanced MRCP if there is a concern for biliary obstruction. Electronically Signed   By: Kerby Moors M.D.   On: 10/29/2016 14:28    EKG:   Orders placed or performed during the hospital encounter of 10/29/16  . ED EKG within 10 minutes  . ED EKG within 10 minutes  , Sinus rhythm with 83 bpm, no ST T changes.  IMPRESSION AND PLAN:   #76 year old patient with generalized weakness, cough and poor by mouth intake for 1 week, found to have hyponatremia.  #1 acute hyponatremia due to poor by mouth intake, admitted to hospitalist service on telemetry, hold diuretics, continue gentle hydration. Patient received 1 L of normal saline in the emergency room, is persistent hyponatremia, sodium 126 this afternoon at 1 PM, 4 PM. Continue gentle hydration, check Chem-7 every 6 hours for another 2 -3 times.  #2/jaundice with total bilirubin 4.1, direct 0.4: Unknown etiology. Patient has no abdominal pain but  Has some nausea.. Status post cholecystectomy in now 2014.. Obtain GI consult for possible MRCP. #3 history of coronary  artery disease status post CABG, PCI: Follows up with cardiology Dr. Esmond Plants #4. Hyperlipidemia: 5 chronic atrial fibrillation: aSymptomaticControlled with metoprolol. Continue aspirin alone.   All the records are reviewed and case discussed with ED provider. Management plans discussed with the patient, family and they are in agreement.  CODE STATUS: full  TOTAL TIME TAKING CARE OF THIS PATIENT: 36minutes.    Epifanio Lesches M.D on 10/29/2016 at 5:16 PM  Between 7am to 6pm - Pager - [phone]  After 6pm go to [email or web address] - password [email or web address] [facility]  [place] [facility]  Office  [phone]  CC: Primary care  physician; No PCP Per Patient  Note: This dictation was prepared with Dragon dictation along with smaller phrase technology. Any transcriptional errors that result from this process are unintentional.

## 2016-10-29 NOTE — ED Triage Notes (Signed)
States general weakness and cough x 2 weeks. Denies chest pain.

## 2016-10-29 NOTE — ED Provider Notes (Signed)
[facility] Emergency Department Provider Note  ____________________________________________  Time seen: Approximately 1:01 PM  I have reviewed the triage vital signs and the nursing notes.   HISTORY  Chief Complaint Weakness   HPI Treyquan Lambertson is a 76 y.o. male with a history of atrial fibrillation, CAD status post CABG, hypertension, hyperlipidemia who presents for evaluation of generalized weakness and cough. Patient reports one week of cough productive of yellow sputum, generalized weakness, and shortness of breath with exertion. Has had nausea but no vomiting. Also endorses a few days of body aches and chills. He does not know if he has had a fever. No chest pain, no abdominal pain, no diarrhea, no dysuria. No known sick contact exposures. Endorse compliance with his medications.  Past Medical History:  Diagnosis Date  . A-fib ([place])   . Coronary artery disease   . Hyperlipidemia   . Hypertension     Patient Active Problem List   Diagnosis Date Noted  . Encounter for anticoagulation discussion and counseling 07/21/2014  . CAD (coronary artery disease) 06/23/2013  . 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  . Atrial fibrillation ([place]) 06/23/2013  . Hyperlipidemia 06/23/2013  . Essential hypertension 06/23/2013  . Gallstone pancreatitis 06/23/2013    Past Surgical History:  Procedure Laterality Date  . CARDIAC CATHETERIZATION    . CHOLECYSTECTOMY    . CORONARY ANGIOPLASTY  06/2004   s/p stent placement @ [facility]  . CORONARY ARTERY BYPASS GRAFT  04-24-2004   CABG x 3 [facility]    Prior to Admission medications   Medication Sig Start Date End Date Taking? Authorizing Provider  aspirin EC 81 MG tablet Take 81 mg by mouth daily as needed.   Yes Historical Provider, MD  atorvastatin (LIPITOR) 80 MG tablet Take 1 tablet (80 mg total) by mouth daily. 07/12/16  Yes [name], MD  enalapril (VASOTEC) 20 MG tablet Take 1 tablet (20 mg total)  by mouth daily. 07/12/16  Yes [name], MD  hydrochlorothiazide (HYDRODIURIL) 25 MG tablet Take 1 tablet (25 mg total) by mouth daily. 07/12/16  Yes [name], MD  metoprolol (LOPRESSOR) 100 MG tablet TAKE 1 TABLET (100 MG TOTAL) BY MOUTH 2 (TWO) TIMES DAILY. 07/12/16  Yes [name], MD  Multiple Vitamin (MULTIVITAMIN) tablet Take 1 tablet by mouth daily.   Yes Historical Provider, MD  rivaroxaban (XARELTO) 20 MG TABS tablet Take 1 tablet (20 mg total) by mouth daily. 07/12/16  Yes [name], MD    Allergies Sulfa antibiotics  Family History  Problem Relation Age of Onset  . Heart disease Father   . Heart disease Paternal Uncle   . Heart attack Brother     Social History Social History  Substance Use Topics  . Smoking status: Never Smoker  . Smokeless tobacco: Not on file  . Alcohol use No    Review of Systems  Constitutional: Negative for fever. + weakness, chills Eyes: Negative for visual changes. ENT: Negative for sore throat. Neck: No neck pain  Cardiovascular: Negative for chest pain. Respiratory: + shortness of breath and cough Gastrointestinal: Negative for abdominal pain, vomiting or diarrhea. + nausea Genitourinary: Negative for dysuria. Musculoskeletal: Negative for back pain. Skin: Negative for rash. Neurological: Negative for headaches, weakness or numbness. Psych: No SI or HI  ____________________________________________   PHYSICAL EXAM:  VITAL SIGNS: ED Triage Vitals  Enc Vitals Group     BP 10/29/16 1240 (!) 159/78  Pulse Rate 10/29/16 1240 75     Resp 10/29/16 1240 20     Temp 10/29/16 1240 99.5 F (37.5 C)     Temp Source 10/29/16 1240 Oral     SpO2 10/29/16 1240 100 %     Weight 10/29/16 1241 206 lb (93.4 kg)     Height 10/29/16 1241 5\' 11"  (1.803 m)     Head Circumference --      Peak Flow --      Pain Score 10/29/16 1243 8     Pain Loc --      Pain Edu? --      Excl. in [place]? --     Constitutional: Alert  and oriented. Well appearing and in no apparent distress. HEENT:      Head: Normocephalic and atraumatic.         Eyes: Conjunctivae are normal. Sclera is non-icteric. EOMI. PERRL      Mouth/Throat: Mucous membranes are moist.       Neck: Supple with no signs of meningismus. Cardiovascular: Regular rate and rhythm. No murmurs, gallops, or rubs. 2+ symmetrical distal pulses are present in all extremities. No JVD. Respiratory: Normal respiratory effort. Lungs are clear to auscultation bilaterally. No wheezes, crackles, or rhonchi.  Gastrointestinal: Soft, non tender, and non distended with positive bowel sounds. No rebound or guarding. Musculoskeletal: Nontender with normal range of motion in all extremities. No edema, cyanosis, or erythema of extremities. Neurologic: Normal speech and language. Face is symmetric. Moving all extremities. No gross focal neurologic deficits are appreciated. Skin: Skin is warm, dry and intact. No rash noted. Psychiatric: Mood and affect are normal. Speech and behavior are normal.  ____________________________________________   LABS (all labs ordered are listed, but only abnormal results are displayed)  Labs Reviewed  CBC - Abnormal; Notable for the following:       Result Value   RBC 3.81 (*)    HCT 36.6 (*)    MCH 34.1 (*)    All other components within normal limits  COMPREHENSIVE METABOLIC PANEL - Abnormal; Notable for the following:    Sodium 126 (*)    Potassium 3.4 (*)    Chloride 90 (*)    Glucose, Bld 124 (*)    BUN 26 (*)    Creatinine, Ser 1.39 (*)    Total Bilirubin 4.1 (*)    GFR calc non Af Amer 49 (*)    GFR calc Af Amer 56 (*)    All other components within normal limits  URINALYSIS, COMPLETE (UACMP) WITH MICROSCOPIC - Abnormal; Notable for the following:    Color, Urine YELLOW (*)    APPearance CLEAR (*)    Ketones, ur 5 (*)    Squamous Epithelial / LPF 0-5 (*)    All other components within normal limits  LIPASE, BLOOD -  Abnormal; Notable for the following:    Lipase 56 (*)    All other components within normal limits  TROPONIN I  INFLUENZA PANEL BY PCR (TYPE A & B)  BILIRUBIN, DIRECT   ____________________________________________  EKG  ED ECG REPORT I, Rudene Re, the attending physician, personally viewed and interpreted this ECG.  Atrial fibrillation, rate of 83, normal QRS and QTc intervals, normal axis, ST depressions in lateral leads, no ST elevation. Depressions are new for prior ____________________________________________  RADIOLOGY  CXR: Negative  RUQ [place]; No acute findings and no evidence for biliary dilatation. Suggest further investigation with contrast enhanced MRCP if there is a concern for biliary  obstruction ____________________________________________   PROCEDURES  Procedure(s) performed: None Procedures Critical Care performed:  None ____________________________________________   INITIAL IMPRESSION / ASSESSMENT AND PLAN / ED COURSE  76 y.o. male with a history of atrial fibrillation, CAD status post CABG, hypertension, hyperlipidemia who presents for evaluation of generalized weakness, chills, productive cough and shortness of breath. Patient is well-appearing, in no distress, vital signs show low-grade temp of 99.39F otherwise no acute findings, lungs are clear to auscultation with no crackles or wheezes, no pitting edema, patient is in A. fib with well-controlled rate. We'll check for the flu, chest x-ray to rule out pneumonia, basic blood work. We'll watch patient on telemetry.  Clinical Course as of Oct 29 1529  Mon Oct 29, 2016  1359 Tbili elevated at 4.1. Patient has no RUQ ttp, and is s/p cholecystectomy in 2014. Unclear etiology at this time. Direct bili pending and [place] has been ordered to rule out retained stone. Sodium is low at 126, will repeat after 1L NS.CXR with no evidence of PNA. Flu pending.  [CV]  1529 Indirect hyperbilirubinemia of unclear etiology.  Patient does not seem to have hemolysis with normal hemoglobin. Will have him f/u with PCP for further investigation. RUQ [place] negative for retained stone. Plan to repeat BMP after IVF and if sodium is improving will dc home. Care transferred to Dr. Cinda Quest.  [CV]    Clinical Course User Index [CV] Rudene Re, MD    Pertinent labs & imaging results that were available during my care of the patient were reviewed by me and considered in my medical decision making (see chart for details).    ____________________________________________   FINAL CLINICAL IMPRESSION(S) / ED DIAGNOSES  Final diagnoses:  Elevated bilirubin  Weakness  Hyponatremia      NEW MEDICATIONS STARTED DURING THIS VISIT:  New Prescriptions   No medications on file     Note:  This document was prepared using Dragon voice recognition software and may include unintentional dictation errors.    Rudene Re, MD 10/29/16 [phone]

## 2016-10-29 NOTE — ED Notes (Signed)
Report to Manuela Schwartz, patient to room 242 with tech.

## 2016-10-30 DIAGNOSIS — R17 Unspecified jaundice: Secondary | ICD-10-CM

## 2016-10-30 DIAGNOSIS — K529 Noninfective gastroenteritis and colitis, unspecified: Secondary | ICD-10-CM

## 2016-10-30 LAB — COMPREHENSIVE METABOLIC PANEL
ALBUMIN: 3.4 g/dL — AB (ref 3.5–5.0)
ALT: 23 U/L (ref 17–63)
AST: 25 U/L (ref 15–41)
Alkaline Phosphatase: 48 U/L (ref 38–126)
Anion gap: 9 (ref 5–15)
BUN: 23 mg/dL — AB (ref 6–20)
CALCIUM: 8.3 mg/dL — AB (ref 8.9–10.3)
CO2: 24 mmol/L (ref 22–32)
CREATININE: 1.09 mg/dL (ref 0.61–1.24)
Chloride: 95 mmol/L — ABNORMAL LOW (ref 101–111)
GFR calc non Af Amer: >60 mL/min (ref >60)
Glucose, Bld: 109 mg/dL — ABNORMAL HIGH (ref 65–99)
Potassium: 3.1 mmol/L — ABNORMAL LOW (ref 3.5–5.1)
SODIUM: 128 mmol/L — AB (ref 135–145)
Total Bilirubin: 3.4 mg/dL — ABNORMAL HIGH (ref 0.3–1.2)
Total Protein: 6.3 g/dL — ABNORMAL LOW (ref 6.5–8.1)

## 2016-10-30 LAB — CBC
HCT: 33.9 % — ABNORMAL LOW (ref 40.0–52.0)
HEMOGLOBIN: 12.5 g/dL — AB (ref 13.0–18.0)
MCH: 35.1 pg — AB (ref 26.0–34.0)
MCHC: 36.8 g/dL — ABNORMAL HIGH (ref 32.0–36.0)
MCV: 95.4 fL (ref 80.0–100.0)
Platelets: 133 10*3/uL — ABNORMAL LOW (ref 150–440)
RBC: 3.55 MIL/uL — AB (ref 4.40–5.90)
RDW: 14 % (ref 11.5–14.5)
WBC: 9.8 10*3/uL (ref 3.8–10.6)

## 2016-10-30 LAB — GAMMA GT: GGT: 19 U/L (ref 7–50)

## 2016-10-30 LAB — MAGNESIUM: Magnesium: 1.4 mg/dL — ABNORMAL LOW (ref 1.7–2.4)

## 2016-10-30 MED ORDER — CALCIUM CARBONATE ANTACID 500 MG PO CHEW
1.0000 | CHEWABLE_TABLET | Freq: Three times a day (TID) | ORAL | Status: AC
Start: 2016-10-30 — End: 2016-10-31
  Administered 2016-10-30 (×2): 200 mg via ORAL
  Filled 2016-10-30 (×2): qty 1

## 2016-10-30 MED ORDER — POTASSIUM CHLORIDE CRYS ER 20 MEQ PO TBCR
40.0000 meq | EXTENDED_RELEASE_TABLET | Freq: Once | ORAL | Status: AC
  Administered 2016-10-30: 40 meq via ORAL
  Filled 2016-10-30: qty 2

## 2016-10-30 NOTE — Progress Notes (Signed)
Pt complaining of nausea, will medicate and reassess in 30 minutes

## 2016-10-30 NOTE — Progress Notes (Signed)
Electrolyte replacement consult  2/20 AM K+ 3.1 and Ca 8.3.  KCL 40 mEq PO x1 ordered by MD. 500 mg CaCO3 ordered TID with meals x 3 doses. Recheck BMP and Mg tomorrow AM.

## 2016-10-30 NOTE — Progress Notes (Signed)
Pt states he is feeling better and not as nauseated as he was earlier.

## 2016-10-30 NOTE — Care Management (Signed)
Patient lives at home with his brother.   Says " I usually walk okay" Does not have a walker.  Has an appointment with a new pcp on Feb 26 but does not remember the name of the physician.  Is followed by Dr Rockey Situ for cardiology.  Patient would be agreeable to consider home health services. PT consult is pending

## 2016-10-30 NOTE — Progress Notes (Signed)
Pt alert and oriented x4, no complaints of pain or discomfort.  Bed in low position, call bell within reach.  Bed alarms on and functioning.  Assessment done and charted.  Will continue to monitor and do hourly rounding throughout the shift 

## 2016-10-30 NOTE — Progress Notes (Signed)
MD Pyreddy made aware of new Potassium level, an order for Potassium 40 Meq PO was obtained.

## 2016-10-30 NOTE — Evaluation (Signed)
Physical Therapy Evaluation Patient Details Name: Jordan Perkins MRN: [id number] DOB: 01/26/1941 Today's Date: 10/30/2016   History of Present Illness  Pt admitted for complaints of weakness. Pt with history of HTN and CAD s/p CABG.  Clinical Impression  Pt is a pleasant 76 year old male who was admitted for hyponatremia. Pt performs bed mobility/transfers with independence and ambulation with cga and no AD. Pt demonstrates deficits with strength/endurnace. Pt is very close to baseline level. Would benefit from skilled PT to address above deficits and promote optimal return to PLOF      Follow Up Recommendations No PT follow up    Equipment Recommendations  None recommended by PT    Recommendations for Other Services       Precautions / Restrictions Precautions Precautions: Fall Restrictions Weight Bearing Restrictions: No      Mobility  Bed Mobility Overal bed mobility: Independent             General bed mobility comments: safe technique performed  Transfers Overall transfer level: Independent Equipment used: None             General transfer comment: safe technique. Slightly dizziness with standing up fast, resolves quickly  Ambulation/Gait Ambulation/Gait assistance: Min guard Ambulation Distance (Feet): 120 Feet Assistive device: None Gait Pattern/deviations: Step-through pattern     General Gait Details: Pt ambulated about 21' with IV pole and the rest of ambulation without AD. Safe technique performed with upright posture. Slow gait speed noted. Pt complains of fatigue and request to return back to room.  Stairs            Wheelchair Mobility    Modified Rankin (Stroke Patients Only)       Balance Overall balance assessment: Independent                                           Pertinent Vitals/Pain Pain Assessment: No/denies pain    Home Living Family/patient expects to be discharged to:: Private residence  (mobile home) Living Arrangements:  (lives with brother) Available Help at Discharge: Family Type of Home: Mobile home Home Access: Stairs to enter Entrance Stairs-Rails: Can reach both Entrance Stairs-Number of Steps: 4 Home Layout: One level Home Equipment: None      Prior Function Level of Independence: Independent               Hand Dominance        Extremity/Trunk Assessment   Upper Extremity Assessment Upper Extremity Assessment: Overall WFL for tasks assessed    Lower Extremity Assessment Lower Extremity Assessment: Generalized weakness (R LE grossly 4/5; L LE grossly 5/5)       Communication   Communication: No difficulties  Cognition Arousal/Alertness: Awake/alert Behavior During Therapy: WFL for tasks assessed/performed Overall Cognitive Status: Within Functional Limits for tasks assessed                      General Comments      Exercises Other Exercises Other Exercises: Seated/standing ther-ex performed including B LE alt. LAQ, marching and mini squats. Safe technique performed x 12 reps. Pt encouraged to continue ther-ex during acute care stay.   Assessment/Plan    PT Assessment Patient needs continued PT services  PT Problem List Decreased strength;Decreased balance       PT Treatment Interventions Gait training;Therapeutic exercise    PT Goals (Current  goals can be found in the Care Plan section)  Acute Rehab PT Goals Patient Stated Goal: to get stronger PT Goal Formulation: With patient Time For Goal Achievement: 11/13/16 Potential to Achieve Goals: Good    Frequency Min 2X/week   Barriers to discharge        Co-evaluation               End of Session Equipment Utilized During Treatment: Gait belt Activity Tolerance: Patient tolerated treatment well Patient left: in bed;with bed alarm set Nurse Communication: Mobility status PT Visit Diagnosis: Difficulty in walking, not elsewhere classified (R26.2)          Time: [id number] PT Time Calculation (min) (ACUTE ONLY): 20 min   Charges:   PT Evaluation $PT Eval Low Complexity: 1 Procedure PT Treatments $Therapeutic Exercise: 8-22 mins   PT G Codes:         Jordan Perkins 2016-11-11, 5:01 PM  Jordan Perkins, PT, DPT [phone]

## 2016-10-30 NOTE — Progress Notes (Signed)
[facility] [place] NAME: Jordan Perkins    MR#:  [id number]  DATE OF BIRTH:  07-31-1941  SUBJECTIVE:  Doing ok this am some memory loss Tolerated diet this am  REVIEW OF SYSTEMS:    Review of Systems  Constitutional: Negative.  Negative for chills, fever and malaise/fatigue.  HENT: Negative.  Negative for ear discharge, ear pain, hearing loss, nosebleeds and sore throat.   Eyes: Negative.  Negative for blurred vision and pain.  Respiratory: Negative.  Negative for cough, hemoptysis, shortness of breath and wheezing.   Cardiovascular: Negative.  Negative for chest pain, palpitations and leg swelling.  Gastrointestinal: Negative.  Negative for abdominal pain, blood in stool, diarrhea, nausea and vomiting.  Genitourinary: Negative.  Negative for dysuria.  Musculoskeletal: Negative.  Negative for back pain.  Skin: Negative.        jaundice  Neurological: Negative for dizziness, tremors, speech change, focal weakness, seizures and headaches.  Endo/Heme/Allergies: Negative.  Does not bruise/bleed easily.  Psychiatric/Behavioral: Positive for memory loss. Negative for depression, hallucinations and suicidal ideas.    Tolerating Diet:yes      DRUG ALLERGIES:   Allergies  Allergen Reactions  . Sulfa Antibiotics Rash    Rash/hives     VITALS:  Blood pressure (!) 147/55, pulse 64, temperature 98.2 F (36.8 C), temperature source Oral, resp. rate 18, height 5\' 11"  (1.803 m), weight 81.8 kg (180 lb 6.4 oz), SpO2 100 %.  PHYSICAL EXAMINATION:   Physical Exam  Constitutional: He is oriented to person, place, and time and well-developed, well-nourished, and in no distress. No distress.  HENT:  Head: Normocephalic.  Eyes: No scleral icterus.  Neck: Normal range of motion. Neck supple. No JVD present. No tracheal deviation present.  Cardiovascular: Normal rate and normal heart sounds.  Exam reveals no gallop and no friction rub.   No  murmur heard. Irregular, irregular  Pulmonary/Chest: Effort normal and breath sounds normal. No respiratory distress. He has no wheezes. He has no rales. He exhibits no tenderness.  Abdominal: Soft. Bowel sounds are normal. He exhibits no distension and no mass. There is no tenderness. There is no rebound and no guarding.  Musculoskeletal: Normal range of motion. He exhibits no edema.  Neurological: He is alert and oriented to person, place, and time.  Skin: Skin is warm. No rash noted. No erythema.  Slight jaundice  Psychiatric: Affect and judgment normal.      LABORATORY PANEL:   CBC  Recent Labs Lab 10/30/16 0455  WBC 9.8  HGB 12.5*  HCT 33.9*  PLT 133*   ------------------------------------------------------------------------------------------------------------------  Chemistries   Recent Labs Lab 10/30/16 0455  NA 128*  K 3.1*  CL 95*  CO2 24  GLUCOSE 109*  BUN 23*  CREATININE 1.09  CALCIUM 8.3*  MG 1.4*  AST 25  ALT 23  ALKPHOS 48  BILITOT 3.4*   ------------------------------------------------------------------------------------------------------------------  Cardiac Enzymes  Recent Labs Lab 10/29/16 1253  TROPONINI <0.03   ------------------------------------------------------------------------------------------------------------------  RADIOLOGY:  Dg Chest Portable 1 View  Result Date: 10/29/2016 CLINICAL DATA:  Generalized weakness with cough for 2 weeks EXAM: PORTABLE CHEST 1 VIEW COMPARISON:  06/06/2013 FINDINGS: Normal heart size. Status post CABG. Borderline hyperinflation. There is no edema, consolidation, effusion, or pneumothorax. IMPRESSION: No evidence of active disease. Electronically Signed   By: Monte Fantasia M.D.   On: 10/29/2016 13:11   US Abdomen Limited Ruq  Result Date: 10/29/2016 CLINICAL DATA:  Elevated bilirubin EXAM: US ABDOMEN  LIMITED - RIGHT UPPER QUADRANT COMPARISON:  06/11/2013 FINDINGS: Gallbladder: Previous  cholecystectomy. Common bile duct: Diameter: 4.7 mm Liver: No focal lesion identified. Within normal limits in parenchymal echogenicity. IMPRESSION: No acute findings and no evidence for biliary dilatation. Suggest further investigation with contrast enhanced MRCP if there is a concern for biliary obstruction. Electronically Signed   By: Kerby Moors M.D.   On: 10/29/2016 14:28     ASSESSMENT AND PLAN:   76 year old male with history of atrial fibrillation and essential hypertension who presents with generalized weakness and found to have hyponatremia.   1. Hyponatremia: This is due to poor by mouth intake and use of diuretics. Continue gentle hydration as sodium is improving  2. Elevated bilirubin: Possibly related to [place] to check GGT Follow-up on MRCP Appreciate GI evaluation  3. Atrial fibrillation: Continue Xarelto And metoprolol  4. Essential hypertension: Continue metoprolol and lisinopril   5. History of CAD status post CABG and PCI: Continue metoprolol, lisinopril , aspirin  Holding statin in light of problem #2    Management plans discussed with the patient and he is in agreement.  CODE STATUS: FULL  TOTAL TIME TAKING CARE OF THIS PATIENT: 30 minutes.   Plan as outlined above discussed with Dr. Vicente Males  POSSIBLE D/C 1-2 days, DEPENDING ON CLINICAL CONDITION.   Nolyn Swab M.D on 10/30/2016 at 11:10 AM  Between 7am to 6pm - Pager - [phone] After 6pm go to [email or web address] - password [email or web address] [facility]  Office  [phone]  CC: Primary care physician; No PCP Per Patient  Note: This dictation was prepared with Dragon dictation along with smaller phrase technology. Any transcriptional errors that result from this process are unintentional.

## 2016-10-30 NOTE — Progress Notes (Signed)
Pt resting in bed.  No complaints of pain or discomfort

## 2016-10-30 NOTE — Consult Note (Signed)
Jonathon Bellows MD  [address] [place], [place] [address] Phone: [phone] Fax : [phone]  Consultation  Referring Provider:    Dr Vianne Bulls Primary Care Physician:  No PCP Per Patient Primary Gastroenterologist:  None         Reason for Consultation:     Jaundice   Date of Admission :  10/29/2016 Date of Consultation:  10/30/2016         HPI:   Jordan Perkins is a 76 y.o. male admitted to the hospital last evening for poor oral intake, cough and nausea. Admitted with acute hyponatremia.   On evaluation of labs found to have an elevated bilirubin and I was consulted. Total bilirubin was elevated at 4.1 with normal direct fraction (0.4) suggesting that the entire elevated bilirubin was indirect.    He says for the past 5-6 days has had cough, nausea,vomiting and diarrhea. Was unable to keep any food down. Today able to keep some liquids down. He felt he had the "flu". Presently abdominal discomfort is better.   Old labs reviewed and it has been elevated in the past as well . Prior ultrasound of the liver in 2014 showed normal common bile duct as well . USG liver yesterday was also normal .   Component     Latest Ref Rng & Units 06/06/2013 06/07/2013 06/09/2013 04/06/2015  Total Bilirubin     0.3 - 1.2 mg/dL 2.7 (H) 2.6 (H) 1.4 (H) 1.1   Component     Latest Ref Rng & Units 10/29/2016 10/30/2016  Total Bilirubin     0.3 - 1.2 mg/dL 4.1 (H) 3.4 (H)    Past Medical History:  Diagnosis Date  . A-fib ([place])   . Coronary artery disease   . Hyperlipidemia   . Hypertension     Past Surgical History:  Procedure Laterality Date  . CARDIAC CATHETERIZATION    . CHOLECYSTECTOMY    . CORONARY ANGIOPLASTY  06/2004   s/p stent placement @ [facility]  . CORONARY ARTERY BYPASS GRAFT  04-24-2004   CABG x 3 [facility]    Prior to Admission medications   Medication Sig Start Date End Date Taking? Authorizing Provider  aspirin EC 81 MG tablet Take 81 mg by mouth daily as needed.   Yes Historical Provider,  MD  atorvastatin (LIPITOR) 80 MG tablet Take 1 tablet (80 mg total) by mouth daily. 07/12/16  Yes [name], MD  enalapril (VASOTEC) 20 MG tablet Take 1 tablet (20 mg total) by mouth daily. 07/12/16  Yes [name], MD  hydrochlorothiazide (HYDRODIURIL) 25 MG tablet Take 1 tablet (25 mg total) by mouth daily. 07/12/16  Yes [name], MD  metoprolol (LOPRESSOR) 100 MG tablet TAKE 1 TABLET (100 MG TOTAL) BY MOUTH 2 (TWO) TIMES DAILY. 07/12/16  Yes [name], MD  Multiple Vitamin (MULTIVITAMIN) tablet Take 1 tablet by mouth daily.   Yes Historical Provider, MD  rivaroxaban (XARELTO) 20 MG TABS tablet Take 1 tablet (20 mg total) by mouth daily. 07/12/16  Yes [name], MD    Family History  Problem Relation Age of Onset  . Heart disease Father   . Heart disease Paternal Uncle   . Heart attack Brother      Social History  Substance Use Topics  . Smoking status: Never Smoker  . Smokeless tobacco: Not on file  . Alcohol use No    Allergies as of 10/29/2016 - Review Complete 10/29/2016  Allergen Reaction Noted  . Sulfa  antibiotics Rash 06/23/2013    Review of Systems:    All systems reviewed and negative except where noted in HPI.   Physical Exam:  Vital signs in last 24 hours: Temp:  [97.7 F (36.5 C)-99.5 F (37.5 C)] 98.2 F (36.8 C) (02/20 0814) Pulse Rate:  [59-75] 64 (02/20 0814) Resp:  [18-20] 18 (02/20 0522) BP: (116-182)/(49-78) 147/55 (02/20 0814) SpO2:  [98 %-100 %] 100 % (02/20 0814) Weight:  [180 lb 6.4 oz (81.8 kg)-206 lb (93.4 kg)] 180 lb 6.4 oz (81.8 kg) (02/20 0522) Last BM Date: 10/28/16 General:   Pleasant, cooperative in NAD Head:  Normocephalic and atraumatic. Eyes:   No icterus.   Conjunctiva pink. PERRLA. Ears:  Normal auditory acuity. Neck:  Supple; no masses or thyroidomegaly Lungs: Respirations even and unlabored. Lungs clear to auscultation bilaterally.   No wheezes, crackles, or rhonchi.  Heart:  Regular rate and  rhythm;  Without murmur, clicks, rubs or gallops Abdomen:  Soft, nondistended, nontender. Normal bowel sounds. No appreciable masses or hepatomegaly.  No rebound or guarding.  Rectal:  Not performed. Msk:  Symmetrical without gross deformities.  Extremities:  Without edema, cyanosis or clubbing. Neurologic:  Alert and oriented x3;  grossly normal neurologically. Psych:  Alert and cooperative. Normal affect.  LAB RESULTS:  Recent Labs  10/29/16 1253 10/30/16 0455  WBC 10.2 9.8  HGB 13.0 12.5*  HCT 36.6* 33.9*  PLT 162 133*   BMET  Recent Labs  10/29/16 1819 10/29/16 2156 10/30/16 0455  NA 128* 127* 128*  K 3.4* 3.2* 3.1*  CL 93* 94* 95*  CO2 25 25 24   GLUCOSE 110* 110* 109*  BUN 25* 23* 23*  CREATININE 1.27* 1.11 1.09  CALCIUM 8.9 8.4* 8.3*   LFT  Recent Labs  10/29/16 1253 10/30/16 0455  PROT 7.5 6.3*  ALBUMIN 4.1 3.4*  AST 35 25  ALT 26 23  ALKPHOS 54 48  BILITOT 4.1* 3.4*  BILIDIR 0.4  --    PT/INR No results for input(s): LABPROT, INR in the last 72 hours.  STUDIES: Dg Chest Portable 1 View  Result Date: 10/29/2016 CLINICAL DATA:  Generalized weakness with cough for 2 weeks EXAM: PORTABLE CHEST 1 VIEW COMPARISON:  06/06/2013 FINDINGS: Normal heart size. Status post CABG. Borderline hyperinflation. There is no edema, consolidation, effusion, or pneumothorax. IMPRESSION: No evidence of active disease. Electronically Signed   By: Monte Fantasia M.D.   On: 10/29/2016 13:11   US Abdomen Limited Ruq  Result Date: 10/29/2016 CLINICAL DATA:  Elevated bilirubin EXAM: US ABDOMEN LIMITED - RIGHT UPPER QUADRANT COMPARISON:  06/11/2013 FINDINGS: Gallbladder: Previous cholecystectomy. Common bile duct: Diameter: 4.7 mm Liver: No focal lesion identified. Within normal limits in parenchymal echogenicity. IMPRESSION: No acute findings and no evidence for biliary dilatation. Suggest further investigation with contrast enhanced MRCP if there is a concern for biliary  obstruction. Electronically Signed   By: Kerby Moors M.D.   On: 10/29/2016 14:28      Impression / Plan:   Jordan Perkins is a 76 y.o. y/o male admitted with hyponatremia likely secondary to hypovolemia ( elevated creatinine and low chloride, thiazide diuretic and ACE inhibitor ) . I have been consulted for an elevated T bilirubin with normal direct fraction . Very likely he has had a viral gastroenteritis as the trigger. Normal ultrasound with no bile duct dilation, He has had similar elevations back in 2014 . 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    Plan 1. Check GGT 2. Follow LFT's expect to resolve with hydration  3. If has diarrhea then check stool for C diff and PCR 4. I  will follow the patient .   Thank you for involving me in the care of this patient.      LOS: 1 day   Jonathon Bellows, MD  10/30/2016, 8:42 AM

## 2016-10-31 ENCOUNTER — Inpatient Hospital Stay: Payer: Medicare Other

## 2016-10-31 LAB — CBC
HEMATOCRIT: 35.1 % — AB (ref 40.0–52.0)
HEMOGLOBIN: 12.4 g/dL — AB (ref 13.0–18.0)
MCH: 34.2 pg — ABNORMAL HIGH (ref 26.0–34.0)
MCHC: 35.3 g/dL (ref 32.0–36.0)
MCV: 96.9 fL (ref 80.0–100.0)
Platelets: 131 10*3/uL — ABNORMAL LOW (ref 150–440)
RBC: 3.62 MIL/uL — AB (ref 4.40–5.90)
RDW: 14 % (ref 11.5–14.5)
WBC: 8.7 10*3/uL (ref 3.8–10.6)

## 2016-10-31 LAB — COMPREHENSIVE METABOLIC PANEL
ALT: 22 U/L (ref 17–63)
ANION GAP: 8 (ref 5–15)
AST: 24 U/L (ref 15–41)
Albumin: 3.3 g/dL — ABNORMAL LOW (ref 3.5–5.0)
Alkaline Phosphatase: 50 U/L (ref 38–126)
BILIRUBIN TOTAL: 2.2 mg/dL — AB (ref 0.3–1.2)
BUN: 16 mg/dL (ref 6–20)
CHLORIDE: 100 mmol/L — AB (ref 101–111)
CO2: 24 mmol/L (ref 22–32)
Calcium: 8.4 mg/dL — ABNORMAL LOW (ref 8.9–10.3)
Creatinine, Ser: 0.94 mg/dL (ref 0.61–1.24)
Glucose, Bld: 106 mg/dL — ABNORMAL HIGH (ref 65–99)
POTASSIUM: 3.5 mmol/L (ref 3.5–5.1)
Sodium: 132 mmol/L — ABNORMAL LOW (ref 135–145)
TOTAL PROTEIN: 6.3 g/dL — AB (ref 6.5–8.1)

## 2016-10-31 LAB — GLUCOSE, CAPILLARY: Glucose-Capillary: 108 mg/dL — ABNORMAL HIGH (ref 65–99)

## 2016-10-31 LAB — MAGNESIUM: MAGNESIUM: 1.4 mg/dL — AB (ref 1.7–2.4)

## 2016-10-31 MED ORDER — MAGNESIUM SULFATE 4 GM/100ML IV SOLN
4.0000 g | Freq: Once | INTRAVENOUS | Status: AC
  Administered 2016-10-31: 4 g via INTRAVENOUS
  Filled 2016-10-31: qty 100

## 2016-10-31 MED ORDER — GADOBENATE DIMEGLUMINE 529 MG/ML IV SOLN
20.0000 mL | Freq: Once | INTRAVENOUS | Status: AC | PRN
  Administered 2016-10-31: 16 mL via INTRAVENOUS

## 2016-10-31 NOTE — Progress Notes (Signed)
[place] for electrolyte management.   Pharmacy consulted for electrolyte management for 76 yo male for 76 yo male admitted with hyponatremia. Patient currently ordered NS at 78mL/hr.    Plan:  Will order magnesium 4g IV x 1. Will recheck electrolytes with am labs.   Allergies  Allergen Reactions  . Sulfa Antibiotics Rash    Rash/hives     Patient Measurements: Height: 5\' 11"  (180.3 cm) Weight: 179 lb 12.8 oz (81.6 kg) IBW/kg (Calculated) : 75.3    Vital Signs: Temp: 98.1 F (36.7 C) (02/21 1126) Temp Source: Oral (02/21 1126) BP: 135/62 (02/21 1126) Pulse Rate: 72 (02/21 1126) Intake/Output from previous day: 02/20 0701 - 02/21 0700 In: 240 [P.O.:240] Out: 650 [Urine:650] Intake/Output from this shift: Total I/O In: 240 [P.O.:240] Out: 601 [Urine:600; Stool:1]  Labs:  Recent Labs  10/29/16 1253  10/29/16 2156 10/30/16 0455 10/31/16 0549  WBC 10.2  --   --  9.8 8.7  HGB 13.0  --   --  12.5* 12.4*  HCT 36.6*  --   --  33.9* 35.1*  PLT 162  --   --  133* 131*  CREATININE 1.39*  < > 1.11 1.09 0.94  MG  --   --   --  1.4* 1.4*  ALBUMIN 4.1  --   --  3.4* 3.3*  PROT 7.5  --   --  6.3* 6.3*  AST 35  --   --  25 24  ALT 26  --   --  23 22  ALKPHOS 54  --   --  48 50  BILITOT 4.1*  --   --  3.4* 2.2*  BILIDIR 0.4  --   --   --   --   < > = values in this interval not displayed. Estimated Creatinine Clearance: 74.5 mL/min (by C-G formula based on SCr of 0.94 mg/dL).   Microbiology: No results found for this or any previous visit (from the past 720 hour(s)).  Pharmacy will continue to monitor and adjust per consult.   Makana Feigel L 10/31/2016,3:20 PM

## 2016-10-31 NOTE — Progress Notes (Signed)
MD Hugelmeyer made aware that pt had a sinus pause of 2.66 seconds, no new order received. Will continue to monitor.

## 2016-10-31 NOTE — Progress Notes (Signed)
[facility] [facility] [place] NAME: Jordan Perkins    MR#:  [id number]  DATE OF BIRTH:  [birth year]  SUBJECTIVE:  Patient doing better this  REVIEW OF SYSTEMS:    Review of Systems  Constitutional: Negative.  Negative for chills, fever and malaise/fatigue.  HENT: Negative.  Negative for ear discharge, ear pain, hearing loss, nosebleeds and sore throat.   Eyes: Negative.  Negative for blurred vision and pain.  Respiratory: Negative.  Negative for cough, hemoptysis, shortness of breath and wheezing.   Cardiovascular: Negative.  Negative for chest pain, palpitations and leg swelling.  Gastrointestinal: Positive for nausea (improved). Negative for abdominal pain, blood in stool, diarrhea and vomiting.  Genitourinary: Negative.  Negative for dysuria.  Musculoskeletal: Negative.  Negative for back pain.  Skin: Negative.   Neurological: Negative for dizziness, tremors, speech change, focal weakness, seizures and headaches.  Endo/Heme/Allergies: Negative.  Does not bruise/bleed easily.  Psychiatric/Behavioral: Positive for memory loss. Negative for depression, hallucinations and suicidal ideas.    Tolerating Diet:yes      DRUG ALLERGIES:   Allergies  Allergen Reactions  . Sulfa Antibiotics Rash    Rash/hives     VITALS:  Blood pressure (!) 139/56, pulse 63, temperature 98.6 F (37 C), temperature source Oral, resp. rate 18, height 5\' 11"  (1.803 m), weight 81.6 kg (179 lb 12.8 oz), SpO2 98 %.  PHYSICAL EXAMINATION:   Physical Exam  Constitutional: He is oriented to person, place, and time and well-developed, well-nourished, and in no distress. No distress.  HENT:  Head: Normocephalic.  Eyes: No scleral icterus.  Neck: Normal range of motion. Neck supple. No JVD present. No tracheal deviation present.  Cardiovascular: Normal rate and normal heart sounds.  Exam reveals no gallop and no friction rub.   No murmur heard. Irregular, irregular   Pulmonary/Chest: Effort normal and breath sounds normal. No respiratory distress. He has no wheezes. He has no rales. He exhibits no tenderness.  Abdominal: Soft. Bowel sounds are normal. He exhibits no distension and no mass. There is no tenderness. There is no rebound and no guarding.  Musculoskeletal: Normal range of motion. He exhibits no edema.  Neurological: He is alert and oriented to person, place, and time.  Skin: Skin is warm. No rash noted. No erythema.  Psychiatric: Affect and judgment normal.      LABORATORY PANEL:   CBC  Recent Labs Lab 10/31/16 0549  WBC 8.7  HGB 12.4*  HCT 35.1*  PLT 131*   ------------------------------------------------------------------------------------------------------------------  Chemistries   Recent Labs Lab 10/31/16 0549  NA 132*  K 3.5  CL 100*  CO2 24  GLUCOSE 106*  BUN 16  CREATININE 0.94  CALCIUM 8.4*  MG 1.4*  AST 24  ALT 22  ALKPHOS 50  BILITOT 2.2*   ------------------------------------------------------------------------------------------------------------------  Cardiac Enzymes  Recent Labs Lab 10/29/16 1253  TROPONINI <0.03   ------------------------------------------------------------------------------------------------------------------  RADIOLOGY:  Dg Chest Portable 1 View  Result Date: 10/29/2016 CLINICAL DATA:  Generalized weakness with cough for 2 weeks EXAM: PORTABLE CHEST 1 VIEW COMPARISON:  06/06/2013 FINDINGS: Normal heart size. Status post CABG. Borderline hyperinflation. There is no edema, consolidation, effusion, or pneumothorax. IMPRESSION: No evidence of active disease. Electronically Signed   By: Monte Fantasia M.D.   On: 10/29/2016 13:11   US Abdomen Limited Ruq  Result Date: 10/29/2016 CLINICAL DATA:  Elevated bilirubin EXAM: US ABDOMEN LIMITED - RIGHT UPPER QUADRANT COMPARISON:  06/11/2013 FINDINGS: Gallbladder: Previous cholecystectomy. Common bile duct: Diameter:  4.7 mm Liver: No  focal lesion identified. Within normal limits in parenchymal echogenicity. IMPRESSION: No acute findings and no evidence for biliary dilatation. Suggest further investigation with contrast enhanced MRCP if there is a concern for biliary obstruction. Electronically Signed   By: Kerby Moors M.D.   On: 10/29/2016 14:28     ASSESSMENT AND PLAN:   76 year old male with history of atrial fibrillation and essential hypertension who presents with generalized weakness and found to have hyponatremia.   1. Hyponatremia: This is due to poor by mouth intake and use of diuretics. Sodium level is improving fatty fluids.  2. Elevated bilirubin: Possibly related to Gilbert's GGT was normal Follow-up on MRCP Appreciate GI evaluation Advance diet 3. Atrial fibrillation: Continue Xarelto And metoprolol  4. Essential hypertension: Continue metoprolol and lisinopril   5. History of CAD status post CABG and PCI: Continue metoprolol, lisinopril , aspirin  Holding statin in light of problem #2, but if MRCP ok then can resume in am  PT RECS no PT needed at d/c  Management plans discussed with the patient and he is in agreement.  CODE STATUS: FULL  TOTAL TIME TAKING CARE OF THIS PATIENT: 26 minutes.    POSSIBLE D/C tomorrow, DEPENDING ON CLINICAL CONDITION.   Jeydi Klingel M.D on 10/31/2016 at 10:09 AM  Between 7am to 6pm - Pager - [phone] After 6pm go to [email or web address] - password [email or web address] [facility]  Office  [phone]  CC: Primary care physician; No PCP Per Patient  Note: This dictation was prepared with Dragon dictation along with smaller phrase technology. Any transcriptional errors that result from this process are unintentional.

## 2016-11-01 LAB — BASIC METABOLIC PANEL
Anion gap: 5 (ref 5–15)
BUN: 13 mg/dL (ref 6–20)
CALCIUM: 8.1 mg/dL — AB (ref 8.9–10.3)
CO2: 29 mmol/L (ref 22–32)
Chloride: 102 mmol/L (ref 101–111)
Creatinine, Ser: 1.01 mg/dL (ref 0.61–1.24)
GFR calc Af Amer: >60 mL/min (ref >60)
GFR calc non Af Amer: >60 mL/min (ref >60)
Glucose, Bld: 121 mg/dL — ABNORMAL HIGH (ref 65–99)
Potassium: 3.7 mmol/L (ref 3.5–5.1)
Sodium: 136 mmol/L (ref 135–145)

## 2016-11-01 LAB — MAGNESIUM: Magnesium: 1.9 mg/dL (ref 1.7–2.4)

## 2016-11-01 LAB — GLUCOSE, CAPILLARY: Glucose-Capillary: 103 mg/dL — ABNORMAL HIGH (ref 65–99)

## 2016-11-01 MED ORDER — POTASSIUM CHLORIDE CRYS ER 20 MEQ PO TBCR
20.0000 meq | EXTENDED_RELEASE_TABLET | Freq: Once | ORAL | Status: AC
  Administered 2016-11-01: 20 meq via ORAL
  Filled 2016-11-01: qty 1

## 2016-11-01 MED ORDER — PNEUMOCOCCAL VAC POLYVALENT 25 MCG/0.5ML IJ INJ
0.5000 mL | INJECTION | Freq: Once | INTRAMUSCULAR | Status: AC
  Administered 2016-11-01: 0.5 mL via INTRAMUSCULAR
  Filled 2016-11-01: qty 0.5

## 2016-11-01 NOTE — Progress Notes (Signed)
Noted bilirubin trending down, MRCP normal   Fits clinical picture of Gilberts syndrome and no further evaluation needed.   I will sign off.  Please call me if any further GI concerns or questions.  We would like to thank you for the opportunity to participate in the care of [name].   Dr Jonathon Bellows  Gastroenterology/Hepatology Pager: [phone]

## 2016-11-01 NOTE — Progress Notes (Signed)
[place] for electrolyte management.   Pharmacy consulted for electrolyte management for 76 yo male for 76 yo male admitted with hyponatremia. Patient currently ordered NS at 34mL/hr.    Na 136, K 3.7, Mag 1.9 - WNL  Plan:  Will order KCl 20 mEq PO x1 as pt has hx of AFib. Will recheck electrolytes with am labs.    Allergies  Allergen Reactions  . Sulfa Antibiotics Rash    Rash/hives     Patient Measurements: Height: 5\' 11"  (180.3 cm) Weight: 178 lb 15.5 oz (81.2 kg) IBW/kg (Calculated) : 75.3    Vital Signs: Temp: 98.3 F (36.8 C) (02/22 0757) Temp Source: Oral (02/22 0757) BP: 156/75 (02/22 0757) Pulse Rate: 66 (02/22 0757) Intake/Output from previous day: 02/21 0701 - 02/22 0700 In: 480 [P.O.:480] Out: 601 [Urine:600; Stool:1] Intake/Output from this shift: No intake/output data recorded.  Labs:  Recent Labs  10/29/16 1253  10/30/16 0455 10/31/16 0549 11/01/16 0406  WBC 10.2  --  9.8 8.7  --   HGB 13.0  --  12.5* 12.4*  --   HCT 36.6*  --  33.9* 35.1*  --   PLT 162  --  133* 131*  --   CREATININE 1.39*  < > 1.09 0.94 1.01  MG  --   --  1.4* 1.4* 1.9  ALBUMIN 4.1  --  3.4* 3.3*  --   PROT 7.5  --  6.3* 6.3*  --   AST 35  --  25 24  --   ALT 26  --  23 22  --   ALKPHOS 54  --  48 50  --   BILITOT 4.1*  --  3.4* 2.2*  --   BILIDIR 0.4  --   --   --   --   < > = values in this interval not displayed. Estimated Creatinine Clearance: 69.4 mL/min (by C-G formula based on SCr of 1.01 mg/dL).   Pharmacy will continue to monitor and adjust per consult.   Rocky Morel 11/01/2016,8:08 AM

## 2016-11-01 NOTE — Plan of Care (Signed)
Problem: Health Behavior/Discharge Planning: Goal: Ability to manage health-related needs will improve Outcome: Progressing Sodium returned to normal. Alerted patient to S&S that he's having this type of problems

## 2016-11-01 NOTE — Progress Notes (Signed)
I have reviewed and agree with the assessment by Stacie Glaze, student RN.  I made an changes I thought were more accurate.  These were minor changes.

## 2016-11-01 NOTE — Discharge Summary (Signed)
[facility] [place] NAME: Jordan Perkins    MR#:  [id number]  DATE OF BIRTH:  1941-02-13  DATE OF ADMISSION:  10/29/2016   ADMITTING PHYSICIAN: Epifanio Lesches, MD  DATE OF DISCHARGE: 11/01/2016 PRIMARY CARE PHYSICIAN: No PCP Per Patient   ADMISSION DIAGNOSIS:  Hyponatremia [E87.1] Weakness [R53.1] Jaundice [R17] Elevated bilirubin [R17] DISCHARGE DIAGNOSIS:  Active Problems:   Hyponatremia  SECONDARY DIAGNOSIS:   Past Medical History:  Diagnosis Date  . A-fib ([place])   . Coronary artery disease   . Hyperlipidemia   . Hypertension    HOSPITAL COURSE:   76 year old male with history of atrial fibrillation and essential hypertension who presents with generalized weakness and found to have hyponatremia.   1. Hyponatremia: This is due to poor by mouth intake and use of diuretics. Sodium level improved with IV NS.  2. 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 Atrial fibrillation: Continue Xarelto And metoprolol  4. Essential hypertension: Continue metoprolol and lisinopril, resume HCTZ but f/u BMP with PCP.   5. History of CAD status post CABG and PCI: Continue metoprolol, lisinopril , aspirin  resume statin.  PT RECS no PT needed at d/c  DISCHARGE CONDITIONS:  Stable, discharge to home today. CONSULTS OBTAINED:   DRUG ALLERGIES:   Allergies  Allergen Reactions  . Sulfa Antibiotics Rash    Rash/hives    DISCHARGE MEDICATIONS:   Allergies as of 11/01/2016      Reactions   Sulfa Antibiotics Rash   Rash/hives      Medication List    TAKE these medications   aspirin EC 81 MG tablet Take 81 mg by mouth daily as needed.   atorvastatin 80 MG tablet Commonly known as:  LIPITOR Take 1 tablet (80 mg total) by mouth daily.   enalapril 20 MG tablet Commonly known as:  VASOTEC Take 1 tablet (20 mg total) by mouth daily.     hydrochlorothiazide 25 MG tablet Commonly known as:  HYDRODIURIL Take 1 tablet (25 mg total) by mouth daily.   metoprolol 100 MG tablet Commonly known as:  LOPRESSOR TAKE 1 TABLET (100 MG TOTAL) BY MOUTH 2 (TWO) TIMES DAILY.   multivitamin tablet Take 1 tablet by mouth daily.   rivaroxaban 20 MG Tabs tablet Commonly known as:  XARELTO Take 1 tablet (20 mg total) by mouth daily.        DISCHARGE INSTRUCTIONS:  See AVS.  If you experience worsening of your admission symptoms, develop shortness of breath, life threatening emergency, suicidal or homicidal thoughts you must seek medical attention immediately by calling 911 or calling your MD immediately  if symptoms less severe.  You Must read complete instructions/literature along with all the possible adverse reactions/side effects for all the Medicines you take and that have been prescribed to you. Take any new Medicines after you have completely understood and accpet all the possible adverse reactions/side effects.   Please note  You were cared for by a hospitalist during your hospital stay. If you have any questions about your discharge medications or the care you received while you were in the hospital after you are discharged, you can call the unit and asked to speak with the hospitalist on call if the hospitalist that took care of you is not available. Once you are discharged, your primary care physician will handle any further medical issues. Please note that NO REFILLS for any discharge  medications will be authorized once you are discharged, as it is imperative that you return to your primary care physician (or establish a relationship with a primary care physician if you do not have one) for your aftercare needs so that they can reassess your need for medications and monitor your lab values.    On the day of Discharge:  VITAL SIGNS:  Blood pressure 126/68, pulse 64, temperature 97.7 F (36.5 C), temperature source Oral,  resp. rate 18, height 5\' 11"  (1.803 m), weight 178 lb 15.5 oz (81.2 kg), SpO2 98 %. PHYSICAL EXAMINATION:  GENERAL:  76 y.o.-year-old patient lying in the bed with no acute distress.  EYES: Pupils equal, round, reactive to light and accommodation. No scleral icterus. Extraocular muscles intact.  HEENT: Head atraumatic, normocephalic. Oropharynx and nasopharynx clear.  NECK:  Supple, no jugular venous distention. No thyroid enlargement, no tenderness.  LUNGS: Normal breath sounds bilaterally, no wheezing, rales,rhonchi or crepitation. No use of accessory muscles of respiration.  CARDIOVASCULAR: S1, S2 normal. No murmurs, rubs, or gallops.  ABDOMEN: Soft, non-tender, non-distended. Bowel sounds present. No organomegaly or mass.  EXTREMITIES: No pedal edema, cyanosis, or clubbing.  NEUROLOGIC: Cranial nerves II through XII are intact. Muscle strength 5/5 in all extremities. Sensation intact. Gait not checked.  PSYCHIATRIC: The patient is alert and oriented x 3.  SKIN: No obvious rash, lesion, or ulcer.  DATA REVIEW:   CBC  Recent Labs Lab 10/31/16 0549  WBC 8.7  HGB 12.4*  HCT 35.1*  PLT 131*    Chemistries   Recent Labs Lab 10/31/16 0549 11/01/16 0406  NA 132* 136  K 3.5 3.7  CL 100* 102  CO2 24 29  GLUCOSE 106* 121*  BUN 16 13  CREATININE 0.94 1.01  CALCIUM 8.4* 8.1*  MG 1.4* 1.9  AST 24  --   ALT 22  --   ALKPHOS 50  --   BILITOT 2.2*  --      Microbiology Results  Results for orders placed or performed in visit on 06/06/13  Culture, blood (single)     Status: None   Collection Time: 06/07/13  5:22 PM  Result Value Ref Range Status   Micro Text Report   Final       COMMENT                   NO GROWTH AEROBICALLY/ANAEROBICALLY IN 5 DAYS   ANTIBIOTIC                                                      Culture, blood (single)     Status: None   Collection Time: 06/07/13  5:30 PM  Result Value Ref Range Status   Micro Text Report   Final       COMMENT                    NO GROWTH AEROBICALLY/ANAEROBICALLY IN 5 DAYS   ANTIBIOTIC                                                        RADIOLOGY:  No results found.   Management plans  discussed with the patient, family and they are in agreement.  CODE STATUS: Full Code   TOTAL TIME TAKING CARE OF THIS PATIENT: 32 minutes.    Demetrios Loll M.D on 11/01/2016 at 1:37 PM  Between 7am to 6pm - Pager - [phone]  After 6pm go to [email or web address] - [employer] [facility]  Office  [phone]  CC: Primary care physician; No PCP Per Patient   Note: This dictation was prepared with Dragon dictation along with smaller phrase technology. Any transcriptional errors that result from this process are unintentional.

## 2016-11-01 NOTE — Discharge Instructions (Signed)
Heart healthy diet

## 2016-11-01 NOTE — Care Management (Signed)
For discharge.  No discharge needs identified or reported

## 2016-11-05 ENCOUNTER — Ambulatory Visit (INDEPENDENT_AMBULATORY_CARE_PROVIDER_SITE_OTHER): Payer: Medicare Other | Admitting: Family Medicine

## 2016-11-05 ENCOUNTER — Encounter: Payer: Self-pay | Admitting: Family Medicine

## 2016-11-05 VITALS — BP 160/78 | HR 63 | Temp 98.4°F | Ht 71.0 in | Wt 191.0 lb

## 2016-11-05 DIAGNOSIS — I10 Essential (primary) hypertension: Secondary | ICD-10-CM

## 2016-11-05 DIAGNOSIS — D649 Anemia, unspecified: Secondary | ICD-10-CM

## 2016-11-05 DIAGNOSIS — E871 Hypo-osmolality and hyponatremia: Secondary | ICD-10-CM | POA: Diagnosis not present

## 2016-11-05 DIAGNOSIS — L821 Other seborrheic keratosis: Secondary | ICD-10-CM | POA: Diagnosis not present

## 2016-11-05 DIAGNOSIS — R413 Other amnesia: Secondary | ICD-10-CM | POA: Diagnosis not present

## 2016-11-05 LAB — CBC
HCT: 35.1 % — ABNORMAL LOW (ref 39.0–52.0)
HEMOGLOBIN: 12 g/dL — AB (ref 13.0–17.0)
MCHC: 34.1 g/dL (ref 30.0–36.0)
MCV: 99.5 fl (ref 78.0–100.0)
PLATELETS: 136 10*3/uL — AB (ref 150.0–400.0)
RBC: 3.52 Mil/uL — AB (ref 4.22–5.81)
RDW: 13.8 % (ref 11.5–15.5)
WBC: 9.9 10*3/uL (ref 4.0–10.5)

## 2016-11-05 LAB — COMPREHENSIVE METABOLIC PANEL
ALBUMIN: 3.8 g/dL (ref 3.5–5.2)
ALT: 23 U/L (ref 0–53)
AST: 22 U/L (ref 0–37)
Alkaline Phosphatase: 53 U/L (ref 39–117)
BILIRUBIN TOTAL: 1.1 mg/dL (ref 0.2–1.2)
BUN: 17 mg/dL (ref 6–23)
CALCIUM: 9.4 mg/dL (ref 8.4–10.5)
CO2: 32 meq/L (ref 19–32)
Chloride: 100 mEq/L (ref 96–112)
Creatinine, Ser: 1.01 mg/dL (ref 0.40–1.50)
GFR: 76.9 mL/min (ref >60.00)
Glucose, Bld: 113 mg/dL — ABNORMAL HIGH (ref 70–99)
Potassium: 3.7 mEq/L (ref 3.5–5.1)
SODIUM: 138 meq/L (ref 135–145)
Total Protein: 6.9 g/dL (ref 6.0–8.3)

## 2016-11-05 LAB — MAGNESIUM: Magnesium: 1.5 mg/dL (ref 1.5–2.5)

## 2016-11-05 NOTE — Assessment & Plan Note (Signed)
Skin lesion appears to be consistent with seborrheic keratosis though given large nature and progressive enlargement and location we will refer to dermatology for further evaluation.

## 2016-11-05 NOTE — Assessment & Plan Note (Signed)
Elevated today though has been well controlled per his report at home. Encouraged him to continue monitoring his blood pressure at home and if rising letting us know.

## 2016-11-05 NOTE — Assessment & Plan Note (Signed)
Found to be hyponatremic prior to recent hospitalization. Sodium improved by discharge. Weakness has progressively been improving. We will recheck sodium today as well as magnesium given that that was low and CBC given slight anemia. Encouraged good oral intake. Continue to monitor. Discussed that it may take some time to fully regain all of his strength.

## 2016-11-05 NOTE — Progress Notes (Addendum)
Marikay Alar, MD Phone: [phone]  Jordan Perkins is a 76 y.o. male who presents today for new patient visit.  Patient was hospitalized recently for weakness. This was generalized weakness and was found to be hyponatremic. He was given IV fluids and his sodium improved. His hyponatremia was felt to be related to medications and poor oral intake. He was also diagnosed with Gilbert's disease given an elevated bilirubin. He had GI evaluation for this. Notes he has been getting progressively better since discharge. He has been trying to eat more and taking in more liquids.  Patient notes hypertension and his blood pressure typically runs in the 130s over 60s at home. He checks it twice daily. He denies chest pain, shortness breath, and edema. He does take enalapril, HCTZ, and metoprolol.   He also reports memory issues. He reports forgetting short-term things though doesn't have long-term memory issues. No issues with ADLs though he does not drive himself anywhere. He manages his own garden at home. He manages his own finances and cooks for himself with no issues.  Patient has a brown spot above his right lateral eyebrow that he reports has been there for 4-5 years. Started off small though has progressively enlarged. He felt like it was an age spot.  Active Ambulatory Problems    Diagnosis Date Noted  . CAD (coronary artery disease) 06/23/2013  . 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  . Atrial fibrillation ([facility]) 06/23/2013  . Hyperlipidemia 06/23/2013  . Essential hypertension 06/23/2013  . Gallstone pancreatitis 06/23/2013  . Encounter for anticoagulation discussion and counseling 07/21/2014  . Hyponatremia 10/29/2016  . Memory difficulty 11/05/2016  . Seborrheic keratoses 11/05/2016   Resolved Ambulatory Problems    Diagnosis Date Noted  . No Resolved Ambulatory Problems   Past Medical History:  Diagnosis Date  . A-fib ([facility])   . Coronary artery disease   .  Hyperlipidemia   . Hypertension     Family History  Problem Relation Age of Onset  . Heart disease Father   . Heart disease Paternal Uncle   . Heart attack Brother     Social History   Social History  . Marital status: Single    Spouse name: N/A  . Number of children: N/A  . Years of education: N/A   Occupational History  . Not on file.   Social History Main Topics  . Smoking status: Never Smoker  . Smokeless tobacco: Never Used  . Alcohol use No  . Drug use: No  . Sexual activity: Not on file   Other Topics Concern  . Not on file   Social History Narrative  . No narrative on file    ROS  General:  Negative for nexplained weight loss, fever Skin: Negative for new or changing mole, sore that won't heal HEENT: Negative for trouble hearing, trouble seeing, ringing in ears, mouth sores, hoarseness, change in voice, dysphagia. CV:  Negative for chest pain, dyspnea, edema, palpitations Resp: Negative for cough, dyspnea, hemoptysis GI: Negative for nausea, vomiting, diarrhea, constipation, abdominal pain, melena, hematochezia. GU: Negative for dysuria, incontinence, urinary hesitance, hematuria, vaginal or penile discharge, polyuria, sexual difficulty, lumps in testicle or breasts MSK: Negative for muscle cramps or aches, joint pain or swelling Neuro: Positive for bilateral leg weakness, Negative for headaches, numbness, dizziness, passing out/fainting Psych: Negative for depression, anxiety, positive for memory problems  Objective  Physical Exam Vitals:   11/05/16 1419  BP: (!) 160/78  Pulse: 63  Temp: 98.4 F (36.9  C)    BP Readings from Last 3 Encounters:  11/05/16 (!) 160/78  11/01/16 126/68  07/12/16 (!) 160/70   Wt Readings from Last 3 Encounters:  11/05/16 191 lb (86.6 kg)  11/01/16 178 lb 15.5 oz (81.2 kg)  07/12/16 197 lb 12.8 oz (89.7 kg)    Physical Exam  Constitutional: No distress.  HENT:  Head: Normocephalic and atraumatic.     Mouth/Throat: Oropharynx is clear and moist. No oropharyngeal exudate.  Eyes: Conjunctivae are normal. Pupils are equal, round, and reactive to light.  Cardiovascular: Normal rate and normal heart sounds.  An irregularly irregular rhythm present.  Pulmonary/Chest: Effort normal and breath sounds normal.  Abdominal: Soft. Bowel sounds are normal. He exhibits no distension. There is no tenderness. There is no rebound and no guarding.  Musculoskeletal: He exhibits no edema.  Neurological: He is alert.  CN 2-12 intact, 5/5 strength in bilateral biceps, triceps, grip, quads, hamstrings, plantar and dorsiflexion, sensation to light touch intact in bilateral UE and LE, normal gait, 2+ patellar reflexes  Skin: Skin is warm and dry. He is not diaphoretic.  Psychiatric: Mood and affect normal.   patient was 3 for 3 on word recall of mini cog, failed clock draw   Assessment/Plan:   Essential hypertension Elevated today though has been well controlled per his report at home. Encouraged him to continue monitoring his blood pressure at home and if rising letting us know.  Hyponatremia Found to be hyponatremic prior to recent hospitalization. Sodium improved by discharge. Weakness has progressively been improving. We will recheck sodium today as well as magnesium given that that was low and CBC given slight anemia. Encouraged good oral intake. Continue to monitor. Discussed that it may take some time to fully regain all of his strength.  Memory difficulty Patient with some short-term memory difficulties. He did pass a 3 word recall on the mini cog test. Discussed continuing to monitor this and if it worsens or changes reevaluating.  Seborrheic keratoses Skin lesion appears to be consistent with seborrheic keratosis though given large nature and progressive enlargement and location we will refer to dermatology for further evaluation.   Orders Placed This Encounter  Procedures  . Comp Met (CMET)   . Magnesium  . CBC  . Ambulatory referral to Dermatology    Referral Priority:   Routine    Referral Type:   Consultation    Referral Reason:   Specialty Services Required    Requested Specialty:   Dermatology    Number of Visits Requested:   1    No orders of the defined types were placed in this encounter.    [name], MD [place]

## 2016-11-05 NOTE — Assessment & Plan Note (Signed)
Patient with some short-term memory difficulties. He did pass a 3 word recall on the mini cog test. Discussed continuing to monitor this and if it worsens or changes reevaluating.

## 2016-11-05 NOTE — Patient Instructions (Signed)
Nice to meet you. Please monitor your blood pressure and if it starts to get above 140/90 consistently please let us know. Please monitor your memory issues and if they worsen please let us know. Please monitor your weakness and if it does not continue to improve please let us know. We will contact her with the results of your lab work.

## 2016-11-07 ENCOUNTER — Other Ambulatory Visit: Payer: Self-pay | Admitting: Family Medicine

## 2016-11-07 DIAGNOSIS — D649 Anemia, unspecified: Secondary | ICD-10-CM

## 2016-11-12 ENCOUNTER — Other Ambulatory Visit: Payer: Self-pay | Admitting: Cardiovascular Disease

## 2016-11-16 ENCOUNTER — Other Ambulatory Visit: Payer: Medicare Other

## 2017-01-12 NOTE — Progress Notes (Signed)
Cardiology Office Note  Date:  01/14/2017   ID:  Aleksandar Duve, DOB [birth year], MRN [id number]  PCP:  [name], MD   Chief Complaint  Patient presents with  . OTHER    6 month f/u no complaints today. Meds reviewed verbally with pt.    HPI:   Mr. Nauert is a pleasant 76 year old gentleman  past medical history of  CAD,  bypass surgery, 2005 at [facility] stents in 2005,  hypertension,  hyperlipidemia,  Chronic atrial fibrillation , on xarelto hospital with shortness of breath and chest pain, rapid atrial fibrillation.  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   negative Hida scan. Now s/p  Cholecystectomy 11/14  Echocardiogram showed ejection fraction 60-65%, mild to moderate TR, moderately elevated right ventricular systolic pressures He was started on anticoagulation, xarelto 20 mg daily for his atrial fibrillation.   CT scan in the hospital of his abdomen and pelvis showed acute pancreatitis with increased fluid density in the peri-pancreatic fat, multiple gallstones, small hiatal hernia  PMH:   has a past medical history of A-fib ([place]); Coronary artery disease;  Hyperlipidemia; and Hypertension.  PSH:    Past Surgical History:  Procedure Laterality Date  . CARDIAC CATHETERIZATION    . CHOLECYSTECTOMY    . CORONARY ANGIOPLASTY  06/2004   s/p stent placement @ [facility]  . CORONARY ARTERY BYPASS GRAFT  04-24-2004   CABG x 3 [facility]    Current Outpatient Prescriptions  Medication Sig Dispense Refill  . aspirin EC 81 MG tablet Take 81 mg by mouth daily as needed.    Marland Kitchen atorvastatin (LIPITOR) 80 MG tablet Take 1 tablet (80 mg total) by mouth daily. 90 tablet 3  . enalapril (VASOTEC) 20 MG tablet Take 1 tablet (20 mg total) by mouth daily. 90 tablet 3  . hydrochlorothiazide (HYDRODIURIL) 25 MG tablet Take 1 tablet (25 mg total) by mouth daily. 90 tablet 3  . metoprolol (LOPRESSOR) 100 MG tablet TAKE 1 TABLET (100 MG TOTAL) BY MOUTH 2 (TWO) TIMES DAILY. 180 tablet 3  . Multiple Vitamin (MULTIVITAMIN) tablet Take 1 tablet by mouth daily.    . rivaroxaban (XARELTO) 20 MG TABS tablet Take 1 tablet (20 mg total) by mouth daily. 90 tablet 3   No current facility-administered medications for this visit.      Allergies:   Sulfa antibiotics   Social History:  The patient  reports that he has never smoked. He has never used smokeless tobacco. He reports that he does not drink alcohol or use drugs.   Family History:   family history includes Heart attack in his brother; Heart disease in his father and paternal uncle.    Review of Systems: Review of Systems  Constitutional: Negative.   Respiratory: Negative.   Cardiovascular: Negative.   Gastrointestinal: Negative.   Musculoskeletal: Negative.   Neurological: Negative.   Psychiatric/Behavioral: Negative.   All other systems reviewed and are negative.    PHYSICAL EXAM: VS:  BP 140/64 (BP Location: Left Arm, Patient Position: Sitting, Cuff Size: Normal)   Pulse 74   Ht 5\' 11"  (1.803 m)   Wt 192 lb (87.1 kg)   BMI 26.78 kg/m  , BMI Body mass index is 26.78 kg/m. GEN: Well nourished, well developed, in  no acute distress  HEENT: normal  Neck: no JVD,2+ left side  carotid bruits, no masses Cardiac: Irregularly irregular, no murmurs, rubs, or gallops,no edema  Respiratory:  clear to auscultation bilaterally, normal work of breathing GI: soft, nontender, nondistended, + BS MS: no deformity or atrophy  Skin: warm and dry, no rash Neuro:  Strength and sensation are intact Psych: euthymic mood, full affect    Recent Labs: 11/05/2016: ALT 23; BUN 17; Creatinine, Ser 1.01; Hemoglobin 12.0; Magnesium 1.5; Platelets 136.0; Potassium 3.7; Sodium 138    Lipid Panel Lab Results  Component Value Date   CHOL 89 (L) 04/06/2015   HDL 40 04/06/2015   LDLCALC 36 04/06/2015   TRIG 67 04/06/2015      Wt Readings from Last 3 Encounters:  01/14/17 192 lb (87.1 kg)  11/05/16 191 lb (86.6 kg)  11/01/16 178 lb 15.5 oz (81.2 kg)       ASSESSMENT AND PLAN:   Left carotid bruit Carotid 07/2016: 40 to 59% stenosis on left, <39% on the right Repeat scan in 2 years time  Coronary artery disease involving native coronary artery of native heart without angina pectoris Currently with no symptoms of angina. No further workup at this time. Continue current medication regimen.  S/P CABG (coronary artery bypass graft) Currently with no symptoms of angina  Essential hypertension Blood pressure is well controlled on today's visit. No changes made to the medications.  Pure hypercholesterolemia We have previously placed an order for lipid panel, he did not have this done in 2017 Will defer repeat cholesterol panel to primary care on annual visit Previously was well controlled  Chronic atrial fibrillation ([place]) Asymptomatic, rate relatively well-controlled on metoprolol    Total encounter time more than 25 minutes  Greater than 50% was spent in counseling and coordination of care with the patient   Disposition:   F/U  6 months   Orders Placed This Encounter  Procedures  . EKG 12-Lead      Signed, Esmond Plants, M.D., Ph.D. 01/14/2017  [facility] [place], [place] [phone]

## 2017-01-14 ENCOUNTER — Encounter: Payer: Self-pay | Admitting: Cardiovascular Disease

## 2017-01-14 ENCOUNTER — Ambulatory Visit (INDEPENDENT_AMBULATORY_CARE_PROVIDER_SITE_OTHER): Payer: Medicare Other | Admitting: Cardiovascular Disease

## 2017-01-14 VITALS — BP 140/64 | HR 74 | Ht 71.0 in | Wt 192.0 lb

## 2017-01-14 DIAGNOSIS — I209 Angina pectoris, unspecified: Secondary | ICD-10-CM

## 2017-01-14 DIAGNOSIS — I482 Chronic atrial fibrillation, unspecified: Secondary | ICD-10-CM

## 2017-01-14 DIAGNOSIS — Z7189 Other specified counseling: Secondary | ICD-10-CM

## 2017-01-14 DIAGNOSIS — I10 Essential (primary) hypertension: Secondary | ICD-10-CM | POA: Diagnosis not present

## 2017-01-14 DIAGNOSIS — E871 Hypo-osmolality and hyponatremia: Secondary | ICD-10-CM

## 2017-01-14 DIAGNOSIS — I25118 Atherosclerotic heart disease of native coronary artery with other forms of angina pectoris: Secondary | ICD-10-CM

## 2017-01-14 DIAGNOSIS — Z951 Presence of aortocoronary bypass graft: Secondary | ICD-10-CM | POA: Diagnosis not present

## 2017-01-14 DIAGNOSIS — E782 Mixed hyperlipidemia: Secondary | ICD-10-CM

## 2017-01-14 NOTE — Patient Instructions (Signed)

## 2017-02-07 ENCOUNTER — Other Ambulatory Visit: Payer: Self-pay | Admitting: Cardiovascular Disease

## 2017-04-02 ENCOUNTER — Ambulatory Visit: Payer: Self-pay | Admitting: Allergy and Immunology

## 2017-05-15 ENCOUNTER — Other Ambulatory Visit: Payer: Self-pay | Admitting: Cardiovascular Disease

## 2017-07-27 ENCOUNTER — Other Ambulatory Visit: Payer: Self-pay | Admitting: Cardiovascular Disease

## 2017-07-28 ENCOUNTER — Other Ambulatory Visit: Payer: Self-pay | Admitting: Cardiovascular Disease

## 2017-07-29 NOTE — Telephone Encounter (Signed)
Please review for refill, Thanks !  

## 2017-08-05 ENCOUNTER — Other Ambulatory Visit: Payer: Self-pay | Admitting: Cardiovascular Disease

## 2017-08-20 ENCOUNTER — Ambulatory Visit: Payer: Medicare Other | Admitting: Cardiovascular Disease

## 2017-08-27 ENCOUNTER — Other Ambulatory Visit: Payer: Self-pay | Admitting: Cardiovascular Disease

## 2017-09-21 NOTE — Progress Notes (Signed)
Cardiology Office Note  Date:  09/23/2017   ID:  Jordan Perkins, DOB 1941/02/10, MRN [id number]  PCP:  [name], MD   Chief Complaint  Patient presents with  . other    6 month follow up. Meds reviewed by the pt. verbally. "doing well."     HPI:   Jordan Perkins is a pleasant 77 year old gentleman  past medical history of  CAD,  bypass surgery, 2005 at [facility] stents in 2005,  hypertension,  hyperlipidemia,  Chronic atrial fibrillation , on xarelto hospital with shortness of breath and chest pain, rapid atrial fibrillation.  Stress test 2014 showed no ischemia Echocardiogram showed ejection fraction 60-65% Carotid 07/2016: 40 to 59% stenosis on left, <39% on the right Who presents for follow up of his CAD and atrial fibrillation  Hospital admission 10/2016 Hyponatremia, weakness, elevated T.Bili (gilberts)  Lab work through primary care, last evaluated February 2018  Active, no regular exercise program Makes banjos ,   plays the banjo  Reports he has been out of his medications for 1 week [facility] told him to call the office   Tolerating anticoagulation, On xarelto 20 mg daily No GI bleeding Denies any shortness breath, chest pain on exertion  EKG personally reviewed by myself on todays visit shows atrial fibrillation with rate 56 bpm, no significant ST or T-wave changes  Other past medical history   hospital admission in 2014, noted to have gallstones.   negative Hida scan. Now s/p  Cholecystectomy 11/14  Echocardiogram showed ejection fraction 60-65%, mild to moderate TR, moderately elevated right ventricular systolic pressures He was started on anticoagulation, xarelto 20 mg daily for his atrial fibrillation.   CT scan in the hospital of his abdomen and pelvis showed acute pancreatitis with increased fluid density in the peri-pancreatic fat, multiple gallstones, small hiatal hernia  PMH:   has a past medical history of A-fib ([place]), Coronary artery disease,  Hyperlipidemia, and Hypertension.  PSH:    Past Surgical History:  Procedure Laterality Date  . CARDIAC CATHETERIZATION    . CHOLECYSTECTOMY    . CORONARY ANGIOPLASTY  06/2004   s/p stent placement @ [facility]  . CORONARY ARTERY BYPASS GRAFT  04-24-2004   CABG x 3 [facility]    Current Outpatient Medications  Medication Sig Dispense Refill  . atorvastatin (LIPITOR) 80 MG tablet Take 1 tablet (80 mg total) by mouth daily. 90 tablet 3  . enalapril (VASOTEC) 20 MG tablet Take 1 tablet (20 mg total) by mouth daily. 90 tablet 3  . hydrochlorothiazide (HYDRODIURIL) 25 MG tablet Take 1 tablet (25 mg total) by mouth daily. 90 tablet 3  . metoprolol tartrate (LOPRESSOR) 100 MG tablet TAKE 1 TABLET (100 MG TOTAL) BY MOUTH 2 (TWO) TIMES DAILY. 180 tablet 3  . Multiple Vitamin (MULTIVITAMIN) tablet Take 1 tablet by mouth daily.    . rivaroxaban (XARELTO) 20 MG TABS tablet TAKE 1 TABLET (20 MG TOTAL) BY MOUTH DAILY. 90 tablet 3   No current facility-administered medications for this visit.      Allergies:   Sulfa antibiotics   Social History:  The patient  reports that  has never smoked. he has never used smokeless tobacco. He reports that he does not drink alcohol or use drugs.   Family History:   family history includes Heart attack in his brother; Heart disease in his father and paternal uncle.    Review of Systems: Review of Systems  Constitutional: Negative.   Respiratory: Negative.   Cardiovascular: Negative.  Gastrointestinal: Negative.   Musculoskeletal: Negative.   Neurological: Negative.   Psychiatric/Behavioral: Negative.   All other systems reviewed and are negative.    PHYSICAL EXAM: VS:  BP (!) 152/72 (BP Location: Left Arm, Patient Position: Sitting, Cuff Size: Normal)   Ht 5\' 11"  (1.803 m)   Wt 185 lb 8 oz (84.1 kg)   BMI 25.87 kg/m  , BMI Body mass index is 25.87 kg/m.   GEN: Well nourished, well developed, in no acute distress  HEENT: normal  Neck: no JVD,2+ left side   carotid bruits, no masses Cardiac: Irregularly irregular, no murmurs, rubs, or gallops,no edema  Respiratory:  clear to auscultation bilaterally, normal work of breathing GI: soft, nontender, nondistended, + BS MS: no deformity or atrophy  Skin: warm and dry, no rash Neuro:  Strength and sensation are intact Psych: euthymic mood, full affect    Recent Labs: 11/05/2016: ALT 23; BUN 17; Creatinine, Ser 1.01; Hemoglobin 12.0; Magnesium 1.5; Platelets 136.0; Potassium 3.7; Sodium 138    Lipid Panel Lab Results  Component Value Date   CHOL 89 (L) 04/06/2015   HDL 40 04/06/2015   LDLCALC 36 04/06/2015   TRIG 67 04/06/2015      Wt Readings from Last 3 Encounters:  09/23/17 185 lb 8 oz (84.1 kg)  01/14/17 192 lb (87.1 kg)  11/05/16 191 lb (86.6 kg)       ASSESSMENT AND PLAN:   Left carotid bruit Carotid 07/2016: 40 to 59% stenosis on left, <39% on the right Repeat scan in late 2019, 2020  Coronary artery disease involving native coronary artery of native heart without angina pectoris Currently with no symptoms of angina. No further workup at this time. Continue current medication regimen.stable  S/P CABG (coronary artery bypass graft) Currently with no symptoms of angina No further testing meds renewed  Essential hypertension Elevated, not taking meds, ran out meds have been renewed.  Pure hypercholesterolemia Will defer repeat cholesterol panel to primary care on annual visit Goal LD <70  Chronic atrial fibrillation (HCC) Asymptomatic, rate relatively well-controlled on metoprolol On xarelto, no complications   Total encounter time more than 25 minutes  Greater than 50% was spent in counseling and coordination of care with the patient   Disposition:   F/U  6 months   Orders Placed This Encounter  Procedures  . Flu Vaccine QUAD 36+ mos IM  . EKG 12-Lead     Signed, Esmond Plants, M.D., Ph.D. 09/23/2017  [place],  [place]

## 2017-09-23 ENCOUNTER — Encounter: Payer: Self-pay | Admitting: Cardiovascular Disease

## 2017-09-23 ENCOUNTER — Ambulatory Visit (INDEPENDENT_AMBULATORY_CARE_PROVIDER_SITE_OTHER): Payer: Medicare Other | Admitting: Cardiovascular Disease

## 2017-09-23 VITALS — BP 152/72 | Ht 71.0 in | Wt 185.5 lb

## 2017-09-23 DIAGNOSIS — I482 Chronic atrial fibrillation, unspecified: Secondary | ICD-10-CM

## 2017-09-23 DIAGNOSIS — E782 Mixed hyperlipidemia: Secondary | ICD-10-CM

## 2017-09-23 DIAGNOSIS — Z951 Presence of aortocoronary bypass graft: Secondary | ICD-10-CM

## 2017-09-23 DIAGNOSIS — I25118 Atherosclerotic heart disease of native coronary artery with other forms of angina pectoris: Secondary | ICD-10-CM

## 2017-09-23 DIAGNOSIS — Z7189 Other specified counseling: Secondary | ICD-10-CM | POA: Diagnosis not present

## 2017-09-23 DIAGNOSIS — I10 Essential (primary) hypertension: Secondary | ICD-10-CM | POA: Diagnosis not present

## 2017-09-23 DIAGNOSIS — I209 Angina pectoris, unspecified: Secondary | ICD-10-CM

## 2017-09-23 DIAGNOSIS — Z23 Encounter for immunization: Secondary | ICD-10-CM

## 2017-09-23 MED ORDER — RIVAROXABAN 20 MG PO TABS: ORAL_TABLET | ORAL | 3 refills | Status: DC

## 2017-09-23 MED ORDER — ATORVASTATIN CALCIUM 80 MG PO TABS: 80.0000 mg | ORAL_TABLET | Freq: Every day | ORAL | 3 refills | Status: DC

## 2017-09-23 MED ORDER — METOPROLOL TARTRATE 100 MG PO TABS: ORAL_TABLET | ORAL | 3 refills | Status: DC

## 2017-09-23 MED ORDER — ENALAPRIL MALEATE 20 MG PO TABS: 20.0000 mg | ORAL_TABLET | Freq: Every day | ORAL | 3 refills | Status: DC

## 2017-09-23 MED ORDER — HYDROCHLOROTHIAZIDE 25 MG PO TABS: 25.0000 mg | ORAL_TABLET | Freq: Every day | ORAL | 3 refills | Status: DC

## 2017-09-23 NOTE — Patient Instructions (Addendum)

## 2017-10-03 ENCOUNTER — Other Ambulatory Visit: Payer: Self-pay | Admitting: *Deleted

## 2017-10-03 DIAGNOSIS — I6529 Occlusion and stenosis of unspecified carotid artery: Secondary | ICD-10-CM

## 2017-10-18 ENCOUNTER — Ambulatory Visit (INDEPENDENT_AMBULATORY_CARE_PROVIDER_SITE_OTHER): Payer: Medicare Other

## 2017-10-18 DIAGNOSIS — I6529 Occlusion and stenosis of unspecified carotid artery: Secondary | ICD-10-CM

## 2017-10-18 LAB — VAS US CAROTID
LCCADDIAS: -20 cm/s
LCCADSYS: -97 cm/s
LCCAPDIAS: 29 cm/s
LCCAPSYS: 119 cm/s
LEFT ECA DIAS: -16 cm/s
LEFT VERTEBRAL DIAS: -13 cm/s
LICADSYS: -102 cm/s
Left ICA dist dias: -31 cm/s
Left ICA prox dias: -24 cm/s
Left ICA prox sys: -151 cm/s
RCCADSYS: -62 cm/s
RCCAPDIAS: 15 cm/s
RCCAPSYS: 108 cm/s
RIGHT ECA DIAS: -7 cm/s
RIGHT VERTEBRAL DIAS: 8 cm/s

## 2017-10-24 ENCOUNTER — Other Ambulatory Visit: Payer: Self-pay | Admitting: *Deleted

## 2017-10-24 DIAGNOSIS — I739 Peripheral vascular disease, unspecified: Principal | ICD-10-CM

## 2017-10-24 DIAGNOSIS — I779 Disorder of arteries and arterioles, unspecified: Secondary | ICD-10-CM

## 2017-11-18 ENCOUNTER — Telehealth: Payer: Self-pay

## 2017-11-18 NOTE — Telephone Encounter (Signed)
Patient states he does not have transportation right now and will call back when he is able to

## 2017-11-18 NOTE — Telephone Encounter (Signed)
-----   Message from [name], MD sent at 11/16/2017  5:09 PM EST ----- Regarding: Call patient I got a reminder in my box for lab work that he did not follow-up on.  Please see if we can get him scheduled for follow-up in the office.  Thanks.  Randall Hiss. ----- Message ----- From: SYSTEM Sent: 11/12/2017  12:07 AM To: [name], MD

## 2017-11-18 NOTE — Telephone Encounter (Signed)
Noted  

## 2018-07-04 ENCOUNTER — Other Ambulatory Visit: Payer: Self-pay | Admitting: Cardiovascular Disease

## 2018-07-04 ENCOUNTER — Telehealth: Payer: Self-pay | Admitting: Cardiovascular Disease

## 2018-07-04 NOTE — Telephone Encounter (Signed)
Please review for refill. Pharmacy is requesting a alternative medication for benazepril.  The benazepril is on back order.

## 2018-07-04 NOTE — Telephone Encounter (Signed)
Message received from refill pool that the patient's benazepril is on back order and the pharmacy is requesting an alternative medication.  To Dr. Rockey Situ to review.

## 2018-07-04 NOTE — Telephone Encounter (Signed)
Phone note created and sent to Dr. Rockey Situ to review further

## 2018-07-06 NOTE — Telephone Encounter (Signed)
In my last clinic note he was on enalapril Must be a time when we change to benazepril How about lisinopril 40 mg daily

## 2018-07-07 MED ORDER — LISINOPRIL 40 MG PO TABS: 40.0000 mg | ORAL_TABLET | Freq: Every day | ORAL | 3 refills | Status: DC

## 2018-07-07 NOTE — Telephone Encounter (Signed)
S/w pharmacy. They have the enalapril on file and it is on backorder. They do have Lisinopril. Rx for lisinopril sent to pharmacy with note saying to discontinue the enalapril.   Called patient and he verbalized understanding that his enalapril was on back order and that we sent in new prescription for lisinopril. I encouraged patient to ask the pharmacy to go over this with him.

## 2018-07-25 DIAGNOSIS — Z23 Encounter for immunization: Secondary | ICD-10-CM | POA: Diagnosis not present

## 2018-10-05 ENCOUNTER — Other Ambulatory Visit: Payer: Self-pay | Admitting: Cardiovascular Disease

## 2018-10-06 ENCOUNTER — Other Ambulatory Visit: Payer: Self-pay | Admitting: Cardiovascular Disease

## 2018-10-06 NOTE — Telephone Encounter (Signed)
Please review for refill. Thanks!  

## 2018-11-29 ENCOUNTER — Other Ambulatory Visit: Payer: Self-pay | Admitting: Cardiovascular Disease

## 2018-12-01 NOTE — Telephone Encounter (Signed)
Refill Request.  

## 2018-12-17 ENCOUNTER — Other Ambulatory Visit: Payer: Self-pay | Admitting: *Deleted

## 2018-12-17 MED ORDER — ATORVASTATIN CALCIUM 80 MG PO TABS: 80.0000 mg | ORAL_TABLET | Freq: Every day | ORAL | 0 refills | Status: DC

## 2018-12-25 ENCOUNTER — Other Ambulatory Visit: Payer: Self-pay

## 2018-12-25 ENCOUNTER — Telehealth: Payer: Self-pay | Admitting: *Deleted

## 2018-12-25 ENCOUNTER — Other Ambulatory Visit: Payer: Self-pay | Admitting: Cardiovascular Disease

## 2018-12-25 ENCOUNTER — Telehealth: Payer: Self-pay

## 2018-12-25 DIAGNOSIS — I4891 Unspecified atrial fibrillation: Secondary | ICD-10-CM

## 2018-12-25 DIAGNOSIS — Z7901 Long term (current) use of anticoagulants: Secondary | ICD-10-CM

## 2018-12-25 NOTE — Telephone Encounter (Signed)
Pt has requested a Xarelto 20mg  refill; pt is 78 yrs old, wt-84.1kg, Crea-1.01 on 11/05/2016-pt needs labs, last seen by Dr. Rockey Situ on 09/23/2017-overdue, per last refill note on last month pt was instructed to call the office to schedule an appt, unsure if the pt was called or if he read this on his medication bottle. Unable to fill as pt needs an appt scheduled and updated labs so I will call the pt to notify him of this.  Labs are important for calculating the creatinine clearance to ensure the correct dose and the pt needs to at least see his Cardiologist annually.   Called the pt and explained the situation to him and he stated he would call the office and he stated he had the number. Advised that once he gets an appt I could refill and send in the refill. Pt is not out of medication at this time and I explained to him to please call so he does not run out of medication. Will monitor.

## 2018-12-25 NOTE — Telephone Encounter (Signed)
Lab orders for Bmet and Cbc in Epic.

## 2018-12-25 NOTE — Telephone Encounter (Signed)
Please review for refill.  

## 2018-12-25 NOTE — Telephone Encounter (Signed)
Virtual Visit Pre-Appointment Phone Call  Steps For Call:  1. Confirm consent - "In the setting of the current Covid19 crisis, you are scheduled for a TELEPHONE visit with your provider on 01/02/2019 at 2:00pm.  Just as we do with many in-office visits, in order for you to participate in this visit, we must obtain consent.  If you'd like, I can send this to your mychart (if signed up) or email for you to review.  Otherwise, I can obtain your verbal consent now.  All virtual visits are billed to your insurance company just like a normal visit would be.  By agreeing to a virtual visit, we'd like you to understand that the technology does not allow for your provider to perform an examination, and thus may limit your provider's ability to fully assess your condition.  Finally, though the technology is pretty good, we cannot assure that it will always work on either your or our end, and in the setting of a video visit, we may have to convert it to a phone-only visit.  In either situation, we cannot ensure that we have a secure connection.  Are you willing to proceed?" STAFF: Did the patient verbally acknowledge consent to telehealth visit? Document YES/NO here: YES  2. Confirm the BEST phone number to call the day of the visit by including in appointment notes  3. Give patient instructions for WebEx/MyChart download to smartphone as below or Doximity/Doxy.me if video visit (depending on what platform provider is using)  4. Advise patient to be prepared with their blood pressure, heart rate, weight, any heart rhythm information, their current medicines, and a piece of paper and pen handy for any instructions they may receive the day of their visit  5. Inform patient they will receive a phone call 15 minutes prior to their appointment time (may be from unknown caller ID) so they should be prepared to answer  6. Confirm that appointment type is correct in Epic appointment notes (VIDEO vs PHONE)      TELEPHONE CALL NOTE  Jordan Perkins has been deemed a candidate for a follow-up tele-health visit to limit community exposure during the Covid-19 pandemic. I spoke with the patient via phone to ensure availability of phone/video source, confirm preferred email & phone number, and discuss instructions and expectations.  I reminded Jordan Perkins to be prepared with any vital sign and/or heart rhythm information that could potentially be obtained via home monitoring, at the time of his visit. I reminded Jordan Perkins to expect a phone call at the time of his visit if his visit.  Jordan Perkins, [place] 12/25/2018 12:27 PM   INSTRUCTIONS FOR DOWNLOADING THE [place] APP TO SMARTPHONE  - If Apple, ask patient to go to [employer] and type in WebEx in the search bar. [place] [employer], the blue/green circle. If Android, go to [employer] and type in [employer] in the search bar. The app is free but as with any other app downloads, their phone may require them to verify saved payment information or Apple/Android password.  - The patient does NOT have to create an account. - On the day of the visit, the assist will walk the patient through joining the meeting with the meeting number/password.  INSTRUCTIONS FOR DOWNLOADING THE MYCHART APP TO SMARTPHONE  - The patient must first make sure to have activated MyChart and know their login information - If Apple, go to [employer] and type in MyChart in the search  bar and download the app. If Android, ask patient to go to [employer] and type in [place] in the search bar and download the app. The app is free but as with any other app downloads, their phone may require them to verify saved payment information or Apple/Android password.  - The patient will need to then log into the app with their MyChart username and password, and select [facility] as their healthcare provider to link the account. When it is time for your visit, go to the MyChart app,  find appointments, and click Begin Video Visit. Be sure to Select Allow for your device to access the Microphone and Camera for your visit. You will then be connected, and your provider will be with you shortly.  **If they have any issues connecting, or need assistance please contact MyChart service desk [phone])**  **If using a computer, in order to ensure the best quality for their visit they will need to use either of the following Internet Browsers: [employer], or Google Chrome**  IF USING DOXIMITY or DOXY.ME - The patient will receive a link just prior to their visit, either by text or email (to be determined day of appointment depending on if it's doxy.me or Doximity).     FULL LENGTH CONSENT FOR TELE-HEALTH VISIT   I hereby voluntarily request, consent and authorize [facility] and its employed or contracted physicians, physician assistants, nurse practitioners or other licensed health care professionals (the Practitioner), to provide me with telemedicine health care services (the "Services") as deemed necessary by the treating Practitioner. I acknowledge and consent to receive the Services by the Practitioner via telemedicine. I understand that the telemedicine visit will involve communicating with the Practitioner through live audiovisual communication technology and the disclosure of certain medical information by electronic transmission. I acknowledge that I have been given the opportunity to request an in-person assessment or other available alternative prior to the telemedicine visit and am voluntarily participating in the telemedicine visit.  I understand that I have the right to withhold or withdraw my consent to the use of telemedicine in the course of my care at any time, without affecting my right to future care or treatment, and that the Practitioner or I may terminate the telemedicine visit at any time. I understand that I have the right to inspect all  information obtained and/or recorded in the course of the telemedicine visit and may receive copies of available information for a reasonable fee.  I understand that some of the potential risks of receiving the Services via telemedicine include:  [place] Delay or interruption in medical evaluation due to technological equipment failure or disruption; . Information transmitted may not be sufficient (e.g. poor resolution of images) to allow for appropriate medical decision making by the Practitioner; and/or  . In rare instances, security protocols could fail, causing a breach of personal health information.  Furthermore, I acknowledge that it is my responsibility to provide information about my medical history, conditions and care that is complete and accurate to the best of my ability. I acknowledge that Practitioner's advice, recommendations, and/or decision may be based on factors not within their control, such as incomplete or inaccurate data provided by me or distortions of diagnostic images or specimens that may result from electronic transmissions. I understand that the practice of medicine is not an exact science and that Practitioner makes no warranties or guarantees regarding treatment outcomes. I acknowledge that I will receive a copy of this consent concurrently  upon execution via email to the email address I last provided but may also request a printed copy by calling the office of [facility].    I understand that my insurance will be billed for this visit.   I have read or had this consent read to me. . I understand the contents of this consent, which adequately explains the benefits and risks of the Services being provided via telemedicine.  . I have been provided ample opportunity to ask questions regarding this consent and the Services and have had my questions answered to my satisfaction. . I give my informed consent for the services to be provided through the use of telemedicine in my  medical care  By participating in this telemedicine visit I agree to the above.

## 2018-12-25 NOTE — Telephone Encounter (Signed)
Pt has scheduled an appt for Dr. Rockey Situ and Triage RN-Lisa states an order has been placed for labs. Will send a refill for 1 month at this time.

## 2018-12-25 NOTE — Telephone Encounter (Signed)
Spoke with patient. Patient is scheduled for a Telephone visit with Dr. Rockey Situ 01/02/2019 @2 :00pm. Made patient aware that I would let Candance know he scheduled an appointment and that I would route to to triage for lab orders to be put in for patient.

## 2018-12-25 NOTE — Telephone Encounter (Signed)
Pt has requested a Xarelto 20mg  refill; pt is 78 yrs old, wt-84.1kg, Crea-1.01 on 11/05/2016-pt needs labs, last seen by Dr. Rockey Situ on 09/23/2017-overdue, per last refill note on last month pt was instructed to call the office to schedule an appt, unsure if the pt was called or if he read this on his medication bottle. Unable to fill as pt needs an appt scheduled and updated labs so I will call the pt to notify him of this.  Called the pt and explained the situation to hm and he stated he would call the office and he had the number. Advised that once he gets an appt I could fill and send in a refill. Pt is not out of medication at this time and I explained to him to please call so he does not run out of medication.

## 2018-12-30 NOTE — Progress Notes (Signed)
Virtual Visit via Telephone Note   This visit type was conducted due to national recommendations for restrictions regarding the COVID-19 Pandemic (e.g. social distancing) in an effort to limit this patient's exposure and mitigate transmission in our community.  Due to his co-morbid illnesses, this patient is at least at moderate risk for complications without adequate follow up.  This format is felt to be most appropriate for this patient at this time.  The patient did not have access to video technology/had technical difficulties with video requiring transitioning to audio format only (telephone).  All issues noted in this document were discussed and addressed.  No physical exam could be performed with this format.  Please refer to the patient's chart for his  consent to telehealth for [facility].   I connected with  Jordan Perkins on 12/30/18 by a video enabled telemedicine application and verified that I am speaking with the correct person using two identifiers. I discussed the limitations of evaluation and management by telemedicine. The patient expressed understanding and agreed to proceed.   Evaluation Performed:  Follow-up visit  Date:  12/30/2018   ID:  Jordan Perkins, DOB [birth year], MRN [id number]  Patient Location:  [place] [address]   Provider location:   [facility], [place] office  PCP:  [name], MD  Cardiologist:  Arvid Right [facility]   Chief Complaint: Leg swelling    History of Present Illness:    Jordan Perkins is a 78 y.o. male who presents via audio/video conferencing for a telehealth visit today.   The patient does not symptoms concerning for COVID-19 infection (fever, chills, cough, or new SHORTNESS OF BREATH).   Patient has a past medical history of CAD,  bypass surgery, 2005 at [facility] stents in 2005,  hypertension,  hyperlipidemia,  Chronic atrial fibrillation , on xarelto hospital with shortness of breath and  chest pain, rapid atrial fibrillation.  Stress test 2014 showed no ischemia Echocardiogram showed ejection fraction 60-65% Carotid 07/2016: 40 to 59% stenosis on left, <39% on the right Who presents for follow up of his CAD and atrial fibrillation  No recent hospital admissions Lives with brother  No chest pain, No SOB, chronic bronchitis active  Mild swelling in legs, Ran out of HCTZ.  Has not had this refilled yet Prior history of running out of his medications  Tolerating anticoagulation, On xarelto 20 mg daily No GI bleeding  Active, no regular exercise program Used to Make banjos ,   plays the banjo  Other past medical history reviewed Hospital admission 10/2016 Hyponatremia, weakness, elevated T.Bili (gilberts)  hospital admission in 2014, noted to have gallstones.   negative Hida scan. Now s/p Cholecystectomy 11/14  CT scan in the hospital of his abdomen and pelvis showed acute pancreatitis with increased fluid density in the peri-pancreatic fat, multiple gallstones, small hiatal hernia  Prior CV studies:   The following studies were reviewed today:  Echocardiogram showed ejection fraction 60-65%, mild to moderate TR, moderately elevated right ventricular systolic pressures  Past Medical History:  Diagnosis Date  . A-fib ([place])   . Coronary artery disease   . Hyperlipidemia   . Hypertension    Past Surgical History:  Procedure Laterality Date  . CARDIAC CATHETERIZATION    . CHOLECYSTECTOMY    . CORONARY ANGIOPLASTY  06/2004   s/p stent placement @ [facility]  . CORONARY ARTERY BYPASS GRAFT  04-24-2004   CABG x 3 [facility]     No  outpatient medications have been marked as taking for the 01/02/19 encounter (Appointment) with [name], MD.     Allergies:   Sulfa antibiotics   Social History   Tobacco Use  . Smoking status: Never Smoker  . Smokeless tobacco: Never Used  Substance Use Topics  . Alcohol use: No  . Drug use: No     Current Outpatient  Medications on File Prior to Visit  Medication Sig Dispense Refill  . atorvastatin (LIPITOR) 80 MG tablet Take 1 tablet (80 mg total) by mouth daily. 30 tablet 0  . hydrochlorothiazide (HYDRODIURIL) 25 MG tablet Take 1 tablet (25 mg total) by mouth daily. 90 tablet 3  . lisinopril (PRINIVIL,ZESTRIL) 40 MG tablet Take 1 tablet (40 mg total) by mouth daily. Discontinue the Enalapril. 90 tablet 3  . metoprolol tartrate (LOPRESSOR) 100 MG tablet TAKE 1 TABLET BY MOUTH TWICE A DAY 180 tablet 0  . Multiple Vitamin (MULTIVITAMIN) tablet Take 1 tablet by mouth daily.    Alveda Reasons 20 MG TABS tablet TAKE 1 TABLET (20 MG TOTAL) BY MOUTH DAILY. NEEDS APPT W/ DR. Rockey Situ FOR FURTHER REFILLS. 30 tablet 0   No current facility-administered medications on file prior to visit.      Family Hx: The patient's family history includes Heart attack in his brother; Heart disease in his father and paternal uncle.  ROS:   Please see the history of present illness.    Review of Systems  Constitutional: Negative.   Respiratory: Negative.   Cardiovascular: Positive for leg swelling.  Gastrointestinal: Negative.   Musculoskeletal: Negative.   Neurological: Negative.   Psychiatric/Behavioral: Negative.   All other systems reviewed and are negative.    Labs/Other Tests and Data Reviewed:    Recent Labs: No results found for requested labs within last 8760 hours.   Recent Lipid Panel Lab Results  Component Value Date/Time   CHOL 89 (L) 04/06/2015 04:42 PM   CHOL 150 06/06/2013 09:47 AM   TRIG 67 04/06/2015 04:42 PM   TRIG 85 06/06/2013 09:47 AM   HDL 40 04/06/2015 04:42 PM   HDL 43 06/06/2013 09:47 AM   CHOLHDL 2.2 04/06/2015 04:42 PM   LDLCALC 36 04/06/2015 04:42 PM   LDLCALC 90 06/06/2013 09:47 AM    Wt Readings from Last 3 Encounters:  09/23/17 185 lb 8 oz (84.1 kg)  01/14/17 192 lb (87.1 kg)  11/05/16 191 lb (86.6 kg)     Exam:    Vital Signs: Vital signs may also be detailed in the  HPI There were no vitals taken for this visit.  Wt Readings from Last 3 Encounters:  09/23/17 185 lb 8 oz (84.1 kg)  01/14/17 192 lb (87.1 kg)  11/05/16 191 lb (86.6 kg)   Temp Readings from Last 3 Encounters:  11/05/16 98.4 F (36.9 C) (Oral)  11/01/16 97.7 F (36.5 C) (Oral)   BP Readings from Last 3 Encounters:  09/23/17 (!) 152/72  01/14/17 140/64  11/05/16 (!) 160/78   Pulse Readings from Last 3 Encounters:  01/14/17 74  11/05/16 63  11/01/16 64    125/70 Pulse 50 -60 resp 16  Well nourished, well developed male in no acute distress. Constitutional:  oriented to person, place, and time. No distress.    ASSESSMENT & PLAN:    Atrial fibrillation, unspecified type ([place]) Permanent atrial fibrillation On metoprolol, anticoagulation Variable heart rate 50-60 per the patient Denies any orthostasis symptoms, no changes made to his medications Recommended if heart rate continues  to run persistently low 50s that he call our office Would likely need to cut back on his metoprolol  Coronary artery disease of native artery of native heart with stable angina pectoris ([place]) Currently with no symptoms of angina. No further workup at this time. Continue current medication regimen.  PAD (peripheral artery disease) ([place]) At later date in office follow-up, would recommend repeat carotid ultrasound  Bilateral carotid artery disease, unspecified type ([place]) As above, continue aggressive lipid management, periodic imaging  S/P CABG (coronary artery bypass graft) Feels relatively euvolemic  Essential hypertension Blood pressure is well controlled on today's visit. No changes made to the medications. Medications have been refilled   COVID-19 Education: The signs and symptoms of COVID-19 were discussed with the patient and how to seek care for testing (follow up with PCP or arrange E-visit).  The importance of social distancing was discussed today.  Patient Risk:   After full  review of this patients clinical status, I feel that they are at least moderate risk at this time.  Time:   Today, I have spent 25 minutes with the patient with telehealth technology discussing the cardiac and medical problems/diagnoses detailed above   10 min spent reviewing the chart prior to patient visit today   Medication Adjustments/Labs and Tests Ordered: Current medicines are reviewed at length with the patient today.  Concerns regarding medicines are outlined above.   Tests Ordered: No tests ordered   Medication Changes: No changes made   Disposition: Follow-up in 6 months   Signed, Ida Rogue, MD  12/30/2018 6:15 PM    [place] Office [address] [place] [address], [place], [place] [address]

## 2019-01-01 ENCOUNTER — Other Ambulatory Visit: Payer: Self-pay | Admitting: Cardiovascular Disease

## 2019-01-02 ENCOUNTER — Telehealth (INDEPENDENT_AMBULATORY_CARE_PROVIDER_SITE_OTHER): Payer: Medicare Other | Admitting: Cardiovascular Disease

## 2019-01-02 ENCOUNTER — Other Ambulatory Visit: Payer: Self-pay

## 2019-01-02 DIAGNOSIS — Z951 Presence of aortocoronary bypass graft: Secondary | ICD-10-CM

## 2019-01-02 DIAGNOSIS — I25118 Atherosclerotic heart disease of native coronary artery with other forms of angina pectoris: Secondary | ICD-10-CM | POA: Diagnosis not present

## 2019-01-02 DIAGNOSIS — I10 Essential (primary) hypertension: Secondary | ICD-10-CM | POA: Diagnosis not present

## 2019-01-02 DIAGNOSIS — I779 Disorder of arteries and arterioles, unspecified: Secondary | ICD-10-CM | POA: Insufficient documentation

## 2019-01-02 DIAGNOSIS — I739 Peripheral vascular disease, unspecified: Secondary | ICD-10-CM

## 2019-01-02 DIAGNOSIS — I4891 Unspecified atrial fibrillation: Secondary | ICD-10-CM

## 2019-01-02 MED ORDER — RIVAROXABAN 20 MG PO TABS: 20.0000 mg | ORAL_TABLET | Freq: Every day | ORAL | 3 refills | Status: DC

## 2019-01-02 MED ORDER — METOPROLOL TARTRATE 100 MG PO TABS: ORAL_TABLET | ORAL | 3 refills | Status: DC

## 2019-01-02 MED ORDER — LISINOPRIL 40 MG PO TABS: 40.0000 mg | ORAL_TABLET | Freq: Every day | ORAL | 3 refills | Status: DC

## 2019-01-02 MED ORDER — HYDROCHLOROTHIAZIDE 25 MG PO TABS: 25.0000 mg | ORAL_TABLET | Freq: Every day | ORAL | 3 refills | Status: DC

## 2019-01-02 MED ORDER — ATORVASTATIN CALCIUM 80 MG PO TABS: 80.0000 mg | ORAL_TABLET | Freq: Every day | ORAL | 3 refills | Status: DC

## 2019-01-02 NOTE — Patient Instructions (Addendum)

## 2019-02-16 ENCOUNTER — Emergency Department: Payer: Medicare Other

## 2019-02-16 ENCOUNTER — Other Ambulatory Visit: Payer: Self-pay

## 2019-02-16 ENCOUNTER — Inpatient Hospital Stay
Admission: EM | Admit: 2019-02-16 | Discharge: 2019-02-18 | DRG: 292 | Disposition: A | Discharge status: Home Health | Payer: Medicare Other | Attending: Internal Medicine | Admitting: Internal Medicine

## 2019-02-16 DIAGNOSIS — I4510 Unspecified right bundle-branch block: Secondary | ICD-10-CM | POA: Diagnosis present

## 2019-02-16 DIAGNOSIS — R001 Bradycardia, unspecified: Secondary | ICD-10-CM | POA: Diagnosis present

## 2019-02-16 DIAGNOSIS — I509 Heart failure, unspecified: Secondary | ICD-10-CM

## 2019-02-16 DIAGNOSIS — E876 Hypokalemia: Secondary | ICD-10-CM | POA: Diagnosis not present

## 2019-02-16 DIAGNOSIS — Z951 Presence of aortocoronary bypass graft: Secondary | ICD-10-CM

## 2019-02-16 DIAGNOSIS — E119 Type 2 diabetes mellitus without complications: Secondary | ICD-10-CM | POA: Diagnosis present

## 2019-02-16 DIAGNOSIS — I482 Chronic atrial fibrillation, unspecified: Secondary | ICD-10-CM | POA: Diagnosis not present

## 2019-02-16 DIAGNOSIS — M79604 Pain in right leg: Secondary | ICD-10-CM | POA: Diagnosis not present

## 2019-02-16 DIAGNOSIS — I4891 Unspecified atrial fibrillation: Secondary | ICD-10-CM | POA: Diagnosis not present

## 2019-02-16 DIAGNOSIS — I25118 Atherosclerotic heart disease of native coronary artery with other forms of angina pectoris: Secondary | ICD-10-CM | POA: Diagnosis present

## 2019-02-16 DIAGNOSIS — R9431 Abnormal electrocardiogram [ECG] [EKG]: Secondary | ICD-10-CM | POA: Diagnosis not present

## 2019-02-16 DIAGNOSIS — Z882 Allergy status to sulfonamides status: Secondary | ICD-10-CM

## 2019-02-16 DIAGNOSIS — R0602 Shortness of breath: Secondary | ICD-10-CM

## 2019-02-16 DIAGNOSIS — E785 Hyperlipidemia, unspecified: Secondary | ICD-10-CM | POA: Diagnosis present

## 2019-02-16 DIAGNOSIS — I5033 Acute on chronic diastolic (congestive) heart failure: Secondary | ICD-10-CM

## 2019-02-16 DIAGNOSIS — Z20828 Contact with and (suspected) exposure to other viral communicable diseases: Secondary | ICD-10-CM | POA: Diagnosis not present

## 2019-02-16 DIAGNOSIS — I878 Other specified disorders of veins: Secondary | ICD-10-CM | POA: Diagnosis present

## 2019-02-16 DIAGNOSIS — Z9889 Other specified postprocedural states: Secondary | ICD-10-CM

## 2019-02-16 DIAGNOSIS — Z7901 Long term (current) use of anticoagulants: Secondary | ICD-10-CM

## 2019-02-16 DIAGNOSIS — J90 Pleural effusion, not elsewhere classified: Secondary | ICD-10-CM | POA: Diagnosis not present

## 2019-02-16 DIAGNOSIS — Z79899 Other long term (current) drug therapy: Secondary | ICD-10-CM

## 2019-02-16 DIAGNOSIS — I110 Hypertensive heart disease with heart failure: Secondary | ICD-10-CM | POA: Diagnosis not present

## 2019-02-16 DIAGNOSIS — Z8249 Family history of ischemic heart disease and other diseases of the circulatory system: Secondary | ICD-10-CM

## 2019-02-16 DIAGNOSIS — Z9861 Coronary angioplasty status: Secondary | ICD-10-CM

## 2019-02-16 DIAGNOSIS — Z9049 Acquired absence of other specified parts of digestive tract: Secondary | ICD-10-CM

## 2019-02-16 DIAGNOSIS — J42 Unspecified chronic bronchitis: Secondary | ICD-10-CM | POA: Diagnosis present

## 2019-02-16 DIAGNOSIS — R600 Localized edema: Secondary | ICD-10-CM | POA: Diagnosis not present

## 2019-02-16 DIAGNOSIS — M7989 Other specified soft tissue disorders: Secondary | ICD-10-CM | POA: Diagnosis not present

## 2019-02-16 DIAGNOSIS — I272 Pulmonary hypertension, unspecified: Secondary | ICD-10-CM | POA: Diagnosis not present

## 2019-02-16 DIAGNOSIS — Z1159 Encounter for screening for other viral diseases: Secondary | ICD-10-CM

## 2019-02-16 DIAGNOSIS — M79605 Pain in left leg: Secondary | ICD-10-CM | POA: Diagnosis not present

## 2019-02-16 DIAGNOSIS — I071 Rheumatic tricuspid insufficiency: Secondary | ICD-10-CM | POA: Diagnosis present

## 2019-02-16 LAB — BRAIN NATRIURETIC PEPTIDE: B Natriuretic Peptide: 532 pg/mL — ABNORMAL HIGH (ref 0.0–100.0)

## 2019-02-16 LAB — CBC
HCT: 32.9 % — ABNORMAL LOW (ref 39.0–52.0)
Hemoglobin: 11.2 g/dL — ABNORMAL LOW (ref 13.0–17.0)
MCH: 33.2 pg (ref 26.0–34.0)
MCHC: 34 g/dL (ref 30.0–36.0)
MCV: 97.6 fL (ref 80.0–100.0)
Platelets: 147 10*3/uL — ABNORMAL LOW (ref 150–400)
RBC: 3.37 MIL/uL — ABNORMAL LOW (ref 4.22–5.81)
RDW: 13.8 % (ref 11.5–15.5)
WBC: 11.2 10*3/uL — ABNORMAL HIGH (ref 4.0–10.5)
nRBC: 0 % (ref 0.0–0.2)

## 2019-02-16 LAB — TROPONIN I
Troponin I: 0.04 ng/mL (ref <0.03)
Troponin I: 0.04 ng/mL (ref <0.03)

## 2019-02-16 LAB — BASIC METABOLIC PANEL
Anion gap: 10 (ref 5–15)
BUN: 25 mg/dL — ABNORMAL HIGH (ref 8–23)
CO2: 26 mmol/L (ref 22–32)
Calcium: 8.8 mg/dL — ABNORMAL LOW (ref 8.9–10.3)
Chloride: 99 mmol/L (ref 98–111)
Creatinine, Ser: 1.27 mg/dL — ABNORMAL HIGH (ref 0.61–1.24)
GFR calc Af Amer: >60 mL/min (ref >60)
GFR calc non Af Amer: 55 mL/min — ABNORMAL LOW (ref >60)
Glucose, Bld: 115 mg/dL — ABNORMAL HIGH (ref 70–99)
Potassium: 3.7 mmol/L (ref 3.5–5.1)
Sodium: 135 mmol/L (ref 135–145)

## 2019-02-16 LAB — HEPATIC FUNCTION PANEL
ALT: 19 U/L (ref 0–44)
AST: 32 U/L (ref 15–41)
Albumin: 3.8 g/dL (ref 3.5–5.0)
Alkaline Phosphatase: 64 U/L (ref 38–126)
Bilirubin, Direct: 0.4 mg/dL — ABNORMAL HIGH (ref 0.0–0.2)
Indirect Bilirubin: 2.1 mg/dL — ABNORMAL HIGH (ref 0.3–0.9)
Total Bilirubin: 2.5 mg/dL — ABNORMAL HIGH (ref 0.3–1.2)
Total Protein: 7.5 g/dL (ref 6.5–8.1)

## 2019-02-16 LAB — SARS CORONAVIRUS 2 BY RT PCR (HOSPITAL ORDER, PERFORMED IN ~~LOC~~ HOSPITAL LAB): SARS Coronavirus 2: NEGATIVE

## 2019-02-16 MED ORDER — SODIUM CHLORIDE 0.9% FLUSH: 3.0000 mL | Freq: Once | INTRAVENOUS | Status: DC

## 2019-02-16 MED ORDER — ADULT MULTIVITAMIN W/MINERALS CH
1.0000 | ORAL_TABLET | Freq: Every day | ORAL | Status: DC
  Administered 2019-02-17 – 2019-02-18 (×2): 1 via ORAL
  Filled 2019-02-16 (×2): qty 1

## 2019-02-16 MED ORDER — ACETAMINOPHEN 325 MG PO TABS: 650.0000 mg | ORAL_TABLET | ORAL | Status: DC | PRN

## 2019-02-16 MED ORDER — ONDANSETRON HCL 4 MG/2ML IJ SOLN: 4.0000 mg | Freq: Four times a day (QID) | INTRAMUSCULAR | Status: DC | PRN

## 2019-02-16 MED ORDER — FUROSEMIDE 10 MG/ML IJ SOLN
20.0000 mg | Freq: Two times a day (BID) | INTRAMUSCULAR | Status: DC
  Administered 2019-02-16 – 2019-02-18 (×4): 20 mg via INTRAVENOUS
  Filled 2019-02-16 (×4): qty 2

## 2019-02-16 MED ORDER — SODIUM CHLORIDE 0.9 % IV SOLN: 250.0000 mL | INTRAVENOUS | Status: DC | PRN

## 2019-02-16 MED ORDER — SODIUM CHLORIDE 0.9% FLUSH: 3.0000 mL | INTRAVENOUS | Status: DC | PRN

## 2019-02-16 MED ORDER — LISINOPRIL 20 MG PO TABS
40.0000 mg | ORAL_TABLET | Freq: Every day | ORAL | Status: DC
  Administered 2019-02-17 – 2019-02-18 (×2): 40 mg via ORAL
  Filled 2019-02-16 (×2): qty 2

## 2019-02-16 MED ORDER — ATORVASTATIN CALCIUM 20 MG PO TABS
80.0000 mg | ORAL_TABLET | Freq: Every day | ORAL | Status: DC
  Administered 2019-02-17 – 2019-02-18 (×2): 80 mg via ORAL
  Filled 2019-02-16 (×2): qty 4

## 2019-02-16 MED ORDER — SODIUM CHLORIDE 0.9% FLUSH
3.0000 mL | Freq: Two times a day (BID) | INTRAVENOUS | Status: DC
  Administered 2019-02-16 – 2019-02-18 (×4): 3 mL via INTRAVENOUS

## 2019-02-16 MED ORDER — RIVAROXABAN 20 MG PO TABS
20.0000 mg | ORAL_TABLET | Freq: Every day | ORAL | Status: DC
  Administered 2019-02-16 – 2019-02-17 (×2): 20 mg via ORAL
  Filled 2019-02-16 (×2): qty 1

## 2019-02-16 MED ORDER — METOPROLOL TARTRATE 50 MG PO TABS
100.0000 mg | ORAL_TABLET | Freq: Two times a day (BID) | ORAL | Status: DC
  Administered 2019-02-16: 100 mg via ORAL
  Filled 2019-02-16: qty 2

## 2019-02-16 NOTE — H&P (Signed)
[place] [facility] [place] NAME: Jordan Perkins    MR#:  [id number]  DATE OF BIRTH:  07-Jan-1941  DATE OF ADMISSION:  02/16/2019  PRIMARY CARE PHYSICIAN: [name], MD   REQUESTING/REFERRING PHYSICIAN:   CHIEF COMPLAINT:   Chief Complaint  Patient presents with  . Leg Swelling    HISTORY OF PRESENT ILLNESS: Jordan Perkins  is a 78 y.o. male with a known history per below presents emergency room with 3-week history of worsening bilateral lower extremity edema, discomfort in the legs with walking, denies any chest pain/shortness of breath, and emergency room lower extremity Dopplers negative for DVT, creatinine 1.2, white count 11,000, chest x-ray noted for right-sided moderate-sized pleural effusion with mild vascular congestion, EKG with T wave depression, per ED provider-Case was discussed with cardiology/Dr. Fletcher Anon, hospitalist asked to admit, patient evaluated emergency room, no apparent distress, resting comfortably in bed, patient is now been admitted for bilateral lower extremity pitting edema suspected due to congestive heart failure exacerbation and/or malnutrition.  PAST MEDICAL HISTORY:   Past Medical History:  Diagnosis Date  . A-fib ([place])   . Coronary artery disease   . Hyperlipidemia   . Hypertension     PAST SURGICAL HISTORY:  Past Surgical History:  Procedure Laterality Date  . CARDIAC CATHETERIZATION    . CHOLECYSTECTOMY    . CORONARY ANGIOPLASTY  06/2004   s/p stent placement @ [facility]  . CORONARY ARTERY BYPASS GRAFT  04-24-2004   CABG x 3 [facility]    SOCIAL HISTORY:  Social History   Tobacco Use  . Smoking status: Never Smoker  . Smokeless tobacco: Never Used  Substance Use Topics  . Alcohol use: No    FAMILY HISTORY:  Family History  Problem Relation Age of Onset  . Heart disease Father   . Heart disease Paternal Uncle   . Heart attack Brother     DRUG ALLERGIES:  Allergies  Allergen Reactions  . Sulfa  Antibiotics Rash    Rash/hives     REVIEW OF SYSTEMS:   CONSTITUTIONAL: No fever, +fatigue  EYES: No blurred or double vision.  EARS, NOSE, AND THROAT: No tinnitus or ear pain.  RESPIRATORY: No cough, shortness of breath, wheezing or hemoptysis.  CARDIOVASCULAR: No chest pain, orthopnea, +edema.  GASTROINTESTINAL: No nausea, vomiting, diarrhea or abdominal pain.  GENITOURINARY: No dysuria, hematuria.  ENDOCRINE: No polyuria, nocturia,  HEMATOLOGY: No anemia, easy bruising or bleeding SKIN: No rash or lesion. MUSCULOSKELETAL: No joint pain or arthritis.  + Bilateral leg pain NEUROLOGIC: No tingling, numbness, weakness.  PSYCHIATRY: No anxiety or depression.   MEDICATIONS AT HOME:  Prior to Admission medications   Medication Sig Start Date End Date Taking? Authorizing Provider  atorvastatin (LIPITOR) 80 MG tablet Take 1 tablet (80 mg total) by mouth daily. 01/02/19  Yes Gollan, Kathlene November, MD  hydrochlorothiazide (HYDRODIURIL) 25 MG tablet Take 1 tablet (25 mg total) by mouth daily. 01/02/19  Yes [name], MD  lisinopril (ZESTRIL) 40 MG tablet Take 1 tablet (40 mg total) by mouth daily. 01/02/19 04/02/19 Yes Gollan, Kathlene November, MD  metoprolol tartrate (LOPRESSOR) 100 MG tablet TAKE 1 TABLET BY MOUTH TWICE A DAY 01/02/19  Yes Gollan, Kathlene November, MD  Multiple Vitamin (MULTIVITAMIN) tablet Take 1 tablet by mouth daily.   Yes [provider]  rivaroxaban (XARELTO) 20 MG TABS tablet Take 1 tablet (20 mg total) by mouth daily with supper. 01/02/19  Yes [name], MD  PHYSICAL EXAMINATION:   VITAL SIGNS: Blood pressure (!) 175/84, pulse 71, temperature 98.8 F (37.1 C), temperature source Oral, resp. rate (!) 25, height 5\' 11"  (1.803 m), weight 78 kg, SpO2 97 %.  GENERAL:  78 y.o.-year-old patient lying in the bed with no acute distress.  Malnourished appearance, disheveled EYES: Pupils equal, round, reactive to light and accommodation. No scleral icterus.  Extraocular muscles intact.  HEENT: Head atraumatic, normocephalic. Oropharynx and nasopharynx clear.  NECK:  Supple, no jugular venous distention. No thyroid enlargement, no tenderness.  LUNGS: Normal breath sounds bilaterally, no wheezing, rales,rhonchi or crepitation. No use of accessory muscles of respiration.  CARDIOVASCULAR: S1, S2 normal. No murmurs, rubs, or gallops.  ABDOMEN: Soft, nontender, nondistended. Bowel sounds present. No organomegaly or mass.  EXTREMITIES: Lower extremity pitting edema, no cyanosis, or clubbing.  NEUROLOGIC: Cranial nerves II through XII are intact. MAES. Gait not checked.  PSYCHIATRIC: The patient is alert and oriented x 3.  SKIN: No obvious rash, lesion, or ulcer.   LABORATORY PANEL:   CBC Recent Labs  Lab 02/16/19 1356  WBC 11.2*  HGB 11.2*  HCT 32.9*  PLT 147*  MCV 97.6  MCH 33.2  MCHC 34.0  RDW 13.8   ------------------------------------------------------------------------------------------------------------------  Chemistries  Recent Labs  Lab 02/16/19 1356  NA 135  K 3.7  CL 99  CO2 26  GLUCOSE 115*  BUN 25*  CREATININE 1.27*  CALCIUM 8.8*   ------------------------------------------------------------------------------------------------------------------ estimated creatinine clearance is 53.5 mL/min (A) (by C-G formula based on SCr of 1.27 mg/dL (H)). ------------------------------------------------------------------------------------------------------------------ No results for input(s): TSH, T4TOTAL, T3FREE, THYROIDAB in the last 72 hours.  Invalid input(s): FREET3   Coagulation profile No results for input(s): INR, PROTIME in the last 168 hours. ------------------------------------------------------------------------------------------------------------------- No results for input(s): DDIMER in the last 72  hours. -------------------------------------------------------------------------------------------------------------------  Cardiac Enzymes Recent Labs  Lab 02/16/19 1356  TROPONINI 0.04*   ------------------------------------------------------------------------------------------------------------------ Invalid input(s): POCBNP  ---------------------------------------------------------------------------------------------------------------  Urinalysis    Component Value Date/Time   COLORURINE YELLOW (A) 10/29/2016 1437   APPEARANCEUR CLEAR (A) 10/29/2016 1437   APPEARANCEUR Hazy 06/06/2013 0947   LABSPEC 1.011 10/29/2016 1437   LABSPEC 1.021 06/06/2013 0947   PHURINE 6.0 10/29/2016 1437   GLUCOSEU NEGATIVE 10/29/2016 1437   GLUCOSEU Negative 06/06/2013 0947   HGBUR NEGATIVE 10/29/2016 1437   BILIRUBINUR NEGATIVE 10/29/2016 1437   BILIRUBINUR Negative 06/06/2013 0947   KETONESUR 5 (A) 10/29/2016 1437   PROTEINUR NEGATIVE 10/29/2016 1437   NITRITE NEGATIVE 10/29/2016 1437   LEUKOCYTESUR NEGATIVE 10/29/2016 1437   LEUKOCYTESUR Negative 06/06/2013 0947     RADIOLOGY: Dg Chest 2 View  Result Date: 02/16/2019 CLINICAL DATA:  Bilateral lower extremity swelling for the past 2-3 weeks. 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 The heart is upper limits of normal in size for age. The mediastinal and hilar contours appear normal. Mild central vascular congestion without overt pulmonary edema. There is a right-sided pleural effusion with overlying atelectasis. No worrisome pulmonary lesions. The bony thorax is intact. IMPRESSION: Moderate-sized right pleural effusion with overlying atelectasis. Mild central vascular congestion without overt pulmonary edema. Electronically Signed   By: Marijo Sanes M.D.   On: 02/16/2019 14:57   US Venous Img Lower Bilateral  Result Date: 02/16/2019 CLINICAL DATA:  Bilateral lower extremity  edema and pain EXAM: BILATERAL LOWER EXTREMITY VENOUS DOPPLER ULTRASOUND TECHNIQUE: Gray-scale sonography with graded compression, as well as color Doppler and duplex ultrasound were performed to evaluate the  lower extremity deep venous systems from the level of the common femoral vein and including the common femoral, femoral, profunda femoral, popliteal and calf veins including the posterior tibial, peroneal and gastrocnemius veins when visible. The superficial great saphenous vein was also interrogated. Spectral Doppler was utilized to evaluate flow at rest and with distal augmentation maneuvers in the common femoral, femoral and popliteal veins. COMPARISON:  None. FINDINGS: RIGHT LOWER EXTREMITY Common Femoral Vein: No evidence of thrombus. Normal compressibility, respiratory phasicity and response to augmentation. Saphenofemoral Junction: No evidence of thrombus. Normal compressibility and flow on color Doppler imaging. Profunda Femoral Vein: No evidence of thrombus. Normal compressibility and flow on color Doppler imaging. Femoral Vein: No evidence of thrombus. Normal compressibility, respiratory phasicity and response to augmentation. Popliteal Vein: No evidence of thrombus. Normal compressibility, respiratory phasicity and response to augmentation. Calf Veins: Limited visualization of calf veins. No gross occlusive thrombus. Superficial Great Saphenous Vein: No evidence of thrombus. Normal compressibility. Venous Reflux:  Not assessed Other Findings:  None. LEFT LOWER EXTREMITY Common Femoral Vein: No evidence of thrombus. Normal compressibility, respiratory phasicity and response to augmentation. Saphenofemoral Junction: No evidence of thrombus. Normal compressibility and flow on color Doppler imaging. Profunda Femoral Vein: No evidence of thrombus. Normal compressibility and flow on color Doppler imaging. Femoral Vein: No evidence of thrombus. Normal compressibility, respiratory phasicity and response to  augmentation. Popliteal Vein: No evidence of thrombus. Normal compressibility, respiratory phasicity and response to augmentation. Calf Veins: Limited visualization of the calf veins. No gross occlusive thrombus. Superficial Great Saphenous Vein: No evidence of thrombus. Normal compressibility. Venous Reflux:  Assessed Other Findings:  None. IMPRESSION: No significant DVT in either extremity. Limited assessment of the calf veins. Electronically Signed   By: Jerilynn Mages.  Shick M.D.   On: 02/16/2019 15:33    EKG: Orders placed or performed during the hospital encounter of 02/16/19  . ED EKG  . ED EKG  . EKG 12-Lead  . EKG 12-Lead    IMPRESSION AND PLAN: *Acute bilateral lower extremity dependent edema Worsening over the last 3 weeks Suspected due to congestive heart failure exacerbation and/or malnutrition Congestive heart failure protocol, strict I&O monitoring, daily weights, IV Lasix twice, TED hose to bilateral lower extremities, and continue close medical monitoring  *Suspected acute diastolic congestive heart failure exacerbation Congestive heart failure protocol, strict I&O monitoring, daily weights, IV Lasix twice daily, Lopressor, lisinopril, check echocardiogram, on Xarelto, physical therapy to evaluate/treat  *History of atrial fibrillation Stable Continue Xarelto and metoprolol  *Chronic hypertension Slightly elevated Continue home regiment, IV hydralazine PRN systolic blood pressure greater than 160, vitals per routine, make changes as per necessary  *Chronic hyperlipidemia, unspecified Continue statin therapy  All the records are reviewed and case discussed with ED provider. Management plans discussed with the patient, family and they are in agreement.  CODE STATUS:full Code Status History    Date Active Date Inactive Code Status Order ID Comments User Context   10/29/2016 1702 11/01/2016 1934 Full Code [id number]  Epifanio Lesches, MD ED       TOTAL TIME TAKING CARE OF  THIS PATIENT: 40 minutes.    Avel Peace Lannah Koike M.D on 02/16/2019   Between 7am to 6pm - Pager - [phone]  After 6pm go to [email or web address] - password [email or web address] [place] [facility]  Office  [phone]  CC: Primary care physician; [name], MD   Note: This dictation was prepared with Dragon dictation along with smaller phrase technology. Any transcriptional errors that result  from this process are unintentional.

## 2019-02-16 NOTE — ED Provider Notes (Signed)
[facility] Emergency Department Provider Note ____________________________________________   First MD Initiated Contact with Patient 02/16/19 1421     (approximate)  I have reviewed the triage vital signs and the nursing notes.   HISTORY  Chief Complaint Leg Swelling    HPI Jordan Perkins is a 78 y.o. male with PMH as noted below who presents with bilateral lower extremity swelling, gradual onset over approximately the last 2 to 3 weeks, and generally symmetrical.  He states that the legs ache when he walks but they are not acutely painful.  He denies any rash or any fever.  He has had no prior history of this swelling.  He denies any recent trauma.  He also denies shortness of breath but states he does get a bit more tired than previously when he exerts himself.   Past Medical History:  Diagnosis Date  . A-fib ([place])   . Coronary artery disease   . Hyperlipidemia   . Hypertension     Patient Active Problem List   Diagnosis Date Noted  . PAD (peripheral artery disease) ([place]) 01/02/2019  . Bilateral carotid artery disease ([place]) 01/02/2019  . Memory difficulty 11/05/2016  . Seborrheic keratoses 11/05/2016  . Hyponatremia 10/29/2016  . Encounter for anticoagulation discussion and counseling 07/21/2014  . CAD (coronary artery disease) 06/23/2013  . 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  . Atrial fibrillation ([place]) 06/23/2013  . Hyperlipidemia 06/23/2013  . Essential hypertension 06/23/2013  . Gallstone pancreatitis 06/23/2013    Past Surgical History:  Procedure Laterality Date  . CARDIAC CATHETERIZATION    . CHOLECYSTECTOMY    . CORONARY ANGIOPLASTY  06/2004   s/p stent placement @ [facility]  . CORONARY ARTERY BYPASS GRAFT  04-24-2004   CABG x 3 [facility]    Prior to Admission medications   Medication Sig Start Date End Date Taking? Authorizing Provider  atorvastatin (LIPITOR) 80 MG tablet Take 1 tablet (80 mg total) by mouth daily.  01/02/19   [name], MD  hydrochlorothiazide (HYDRODIURIL) 25 MG tablet Take 1 tablet (25 mg total) by mouth daily. 01/02/19   [name], MD  lisinopril (ZESTRIL) 40 MG tablet Take 1 tablet (40 mg total) by mouth daily. 01/02/19 04/02/19  [name], MD  metoprolol tartrate (LOPRESSOR) 100 MG tablet TAKE 1 TABLET BY MOUTH TWICE A DAY 01/02/19   [name], MD  Multiple Vitamin (MULTIVITAMIN) tablet Take 1 tablet by mouth daily.    [provider]  rivaroxaban (XARELTO) 20 MG TABS tablet Take 1 tablet (20 mg total) by mouth daily with supper. 01/02/19   [name], MD    Allergies Sulfa antibiotics  Family History  Problem Relation Age of Onset  . Heart disease Father   . Heart disease Paternal Uncle   . Heart attack Brother     Social History Social History   Tobacco Use  . Smoking status: Never Smoker  . Smokeless tobacco: Never Used  Substance Use Topics  . Alcohol use: No  . Drug use: No    Review of Systems  Constitutional: No fever. Eyes: No redness. ENT: No sore throat. Cardiovascular: Denies chest pain. Respiratory: Positive for mild shortness of breath. Gastrointestinal: No vomiting or diarrhea.  Genitourinary: Negative for dysuria.  Musculoskeletal: Negative for back pain. Skin: Negative for rash. Neurological: Negative for headache.   ____________________________________________   PHYSICAL EXAM:  VITAL SIGNS: ED Triage Vitals  Enc Vitals Group     BP 02/16/19  1341 (!) 146/56     Pulse Rate 02/16/19 1341 63     Resp --      Temp 02/16/19 1341 98.8 F (37.1 C)     Temp Source 02/16/19 1341 Oral     SpO2 02/16/19 1341 98 %     Weight 02/16/19 1341 172 lb (78 kg)     Height 02/16/19 1341 5\' 11"  (1.803 m)     Head Circumference --      Peak Flow --      Pain Score 02/16/19 1349 5     Pain Loc --      Pain Edu? --      Excl. in [place]? --     Constitutional: Alert and oriented.  Relatively well appearing and  in no acute distress. Eyes: Conjunctivae are normal.  Head: Atraumatic. Nose: No congestion/rhinnorhea. Mouth/Throat: Mucous membranes are moist.   Neck: Normal range of motion.  Cardiovascular: Normal rate, regular rhythm. Good peripheral circulation. Respiratory: Normal respiratory effort.   Gastrointestinal: No distention.  Musculoskeletal: 2+ bilateral lower extremity edema, chronic appearing with venous stasis changes.  2+ DP pulses bilaterally.  No erythema, induration, or abnormal warmth.  Extremities warm and well perfused.  Neurologic:  Normal speech and language. No gross focal neurologic deficits are appreciated.  Skin:  Skin is warm and dry. No rash noted. Psychiatric: Mood and affect are normal. Speech and behavior are normal.  ____________________________________________   LABS (all labs ordered are listed, but only abnormal results are displayed)  Labs Reviewed  BASIC METABOLIC PANEL - Abnormal; Notable for the following components:      Result Value   Glucose, Bld 115 (*)    BUN 25 (*)    Creatinine, Ser 1.27 (*)    Calcium 8.8 (*)    GFR calc non Af Amer 55 (*)    All other components within normal limits  CBC - Abnormal; Notable for the following components:   WBC 11.2 (*)    RBC 3.37 (*)    Hemoglobin 11.2 (*)    HCT 32.9 (*)    Platelets 147 (*)    All other components within normal limits  TROPONIN I - Abnormal; Notable for the following components:   Troponin I 0.04 (*)    All other components within normal limits  SARS CORONAVIRUS 2 (HOSPITAL ORDER, [place])  BRAIN NATRIURETIC PEPTIDE  HEPATIC FUNCTION PANEL  TROPONIN I   ____________________________________________  EKG  ED ECG REPORT I, Arta Silence, the attending physician, personally viewed and interpreted this ECG.  Date: 02/16/2019 EKG Time: 1356 Rate: 70 Rhythm: normal sinus rhythm QRS Axis: Right axis Intervals: normal ST/T Wave abnormalities: T  wave inversions inferior lateral Narrative Interpretation: no evidence of acute ischemia  ____________________________________________  RADIOLOGY  CXR: Right-sided pleural effusion with no acute abnormalities [place] LE venous: Pending  ____________________________________________   PROCEDURES  Procedure(s) performed: No  Procedures  Critical Care performed: No ____________________________________________   INITIAL IMPRESSION / ASSESSMENT AND PLAN / ED COURSE  Pertinent labs & imaging results that were available during my care of the patient were reviewed by me and considered in my medical decision making (see chart for details).  78 year old male with PMH as noted above presents with bilateral lower extremity edema over the last few weeks, associated with worsening exertional fatigue and some shortness of breath.  I reviewed the past medical records in [place].  The patient was most recent admitted in early 2018 with hyponatremia  and weakness.  He has a history of CAD with remote history of catheterization.  On exam today he is overall relatively well-appearing.  His vital signs are normal.  He has bilateral lower extremity edema although it appears somewhat chronic and consistent with venous stasis type changes.  He has good pulses distally and no evidence of cellulitis.  Overall I suspect most likely peripheral edema related to chronic venous stasis, versus possible subacute onset of CHF, or less likely DVT.  Given that this is a new finding for the patient we will obtain ultrasound to rule out DVT, lab work-up, chest x-ray, and reassess.  ----------------------------------------- 3:43 PM on 02/16/2019 -----------------------------------------  Chest x-ray shows vascular congestion.  DVT study is negative.  I suspect most likely subacute onset of mild CHF.  However given the new diffuse T wave inversions and newly elevated troponin, we will admit the patient for ACS rule out.  I  signed him out to the hospitalist Dr. Jerelyn Charles.  _____________________________________  [name] was evaluated in Emergency Department on 02/16/2019 for the symptoms described in the history of present illness. He was evaluated in the context of the global COVID-19 pandemic, which necessitated consideration that the patient might be at risk for infection with the SARS-CoV-2 virus that causes COVID-19. Institutional protocols and algorithms that pertain to the evaluation of patients at risk for COVID-19 are in a state of rapid change based on information released by regulatory bodies including the CDC and federal and state organizations. These policies and algorithms were followed during the patient's care in the ED. ____________________________________________   FINAL CLINICAL IMPRESSION(S) / ED DIAGNOSES  Final diagnoses:  EKG abnormalities      NEW MEDICATIONS STARTED DURING THIS VISIT:  New Prescriptions   No medications on file     Note:  This document was prepared using Dragon voice recognition software and may include unintentional dictation errors.    Arta Silence, MD 02/16/19 [phone]

## 2019-02-16 NOTE — Progress Notes (Signed)
Family Meeting Note  Advance Directive:yes  Today a meeting took place with the Patient.  Patient is able to participate   The following clinical team members were present during this meeting:MD  The following were discussed:Patient's diagnosis:77 y.o. male with a known history per below presents emergency room with 3-week history of worsening bilateral lower extremity edema, discomfort in the legs with walking, denies any chest pain/shortness of breath, and emergency room lower extremity Dopplers negative for DVT, creatinine 1.2, white count 11,000, chest x-ray noted for right-sided moderate-sized pleural effusion with mild vascular congestion, EKG with T wave depression, per ED provider-Case was discussed with cardiology/Dr. Fletcher Anon, hospitalist asked to admit, patient evaluated emergency room, no apparent distress, resting comfortably in bed, patient is now been admitted for bilateral lower extremity pitting edema suspected due to congestive heart failure exacerbation and/or malnutrition, Patient's progosis: Unable to determine and Goals for treatment: Full Code  Additional follow-up to be provided: prn  Time spent during discussion:20 minutes  Gorden Harms, MD

## 2019-02-16 NOTE — ED Notes (Signed)
Spoke to [employer] in lab. Patient's troponin = 0.04. MD notified

## 2019-02-16 NOTE — ED Triage Notes (Signed)
Pt c/o increased BL LE swelling for the past 2-3 weeks . States he taking a daily fluid pill but is not helping.pt is in NAD, denies SOB

## 2019-02-16 NOTE — ED Notes (Signed)
ED TO INPATIENT HANDOFF REPORT  ED Nurse Name and Phone #:  [phone]  S Name/Age/Gender Jordan Perkins 78 y.o. male Room/Bed: ED08A/ED08A  Code Status   Code Status: Prior  Home/SNF/Other Home Patient oriented to: self, place, time and situation Is this baseline? Yes   Triage Complete: Triage complete  Chief Complaint legs hurting  Triage Note Pt c/o increased BL LE swelling for the past 2-3 weeks . States he taking a daily fluid pill but is not helping.pt is in NAD, denies SOB   Allergies Allergies  Allergen Reactions  . Sulfa Antibiotics Rash    Rash/hives     Level of Care/Admitting Diagnosis ED Disposition    ED Disposition Condition Comment   Admit  Hospital Area: [facility] [[id number]]  Level of Care: Telemetry [5]  Covid Evaluation: Confirmed COVID Negative  Diagnosis: CHF (congestive heart failure) [facility]) [[id number]]  Admitting Physician: Gorden Harms [[id number]]  Attending Physician: Gorden Harms [[id number]]  PT Class (Do Not Modify): Observation [104]  PT Acc Code (Do Not Modify): Observation [10022]       B Medical/Surgery History Past Medical History:  Diagnosis Date  . A-fib ([place])   . Coronary artery disease   . Hyperlipidemia   . Hypertension    Past Surgical History:  Procedure Laterality Date  . CARDIAC CATHETERIZATION    . CHOLECYSTECTOMY    . CORONARY ANGIOPLASTY  06/2004   s/p stent placement @ [facility]  . CORONARY ARTERY BYPASS GRAFT  04-24-2004   CABG x 3 [facility]     A IV Location/Drains/Wounds Patient Lines/Drains/Airways Status   Active Line/Drains/Airways    None          Intake/Output Last 24 hours No intake or output data in the 24 hours ending 02/16/19 1652  Labs/Imaging Results for orders placed or performed during the hospital encounter of 02/16/19 (from the past 48 hour(s))  Basic metabolic panel     Status: Abnormal   Collection Time: 02/16/19  1:56 PM  Result Value Ref Range   Sodium  135 135 - 145 mmol/L   Potassium 3.7 3.5 - 5.1 mmol/L   Chloride 99 98 - 111 mmol/L   CO2 26 22 - 32 mmol/L   Glucose, Bld 115 (H) 70 - 99 mg/dL   BUN 25 (H) 8 - 23 mg/dL   Creatinine, Ser 1.27 (H) 0.61 - 1.24 mg/dL   Calcium 8.8 (L) 8.9 - 10.3 mg/dL   GFR calc non Af Amer 55 (L) >60 mL/min   GFR calc Af Amer >60 >60 mL/min   Anion gap 10 5 - 15    Comment: Performed at [facility], [address] [place], [place] [address]  CBC     Status: Abnormal   Collection Time: 02/16/19  1:56 PM  Result Value Ref Range   WBC 11.2 (H) 4.0 - 10.5 K/uL   RBC 3.37 (L) 4.22 - 5.81 MIL/uL   Hemoglobin 11.2 (L) 13.0 - 17.0 g/dL   HCT 32.9 (L) 39.0 - 52.0 %   MCV 97.6 80.0 - 100.0 fL   MCH 33.2 26.0 - 34.0 pg   MCHC 34.0 30.0 - 36.0 g/dL   RDW 13.8 11.5 - 15.5 %   Platelets 147 (L) 150 - 400 K/uL   nRBC 0.0 0.0 - 0.2 %    Comment: Performed at [facility], [address] [place], [place] [address]  Troponin I - ONCE - STAT  Status: Abnormal   Collection Time: 02/16/19  1:56 PM  Result Value Ref Range   Troponin I 0.04 (HH) <0.03 ng/mL    Comment: CRITICAL RESULT CALLED TO, READ BACK BY AND VERIFIED WITH [employer] @1424  02/16/19 AKT Performed at [facility], [address] [place], [place] [address]   Brain natriuretic peptide     Status: Abnormal   Collection Time: 02/16/19  1:56 PM  Result Value Ref Range   B Natriuretic Peptide 532.0 (H) 0.0 - 100.0 pg/mL    Comment: Performed at [facility], [address], [place], [place] [address]   Dg Chest 2 View  Result Date: 02/16/2019 CLINICAL DATA:  Bilateral lower extremity swelling for the past 2-3 weeks. 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 The heart is upper limits of normal in size for age. The mediastinal and hilar contours appear normal. Mild central vascular congestion without overt pulmonary edema. There is a right-sided  pleural effusion with overlying atelectasis. No worrisome pulmonary lesions. The bony thorax is intact. IMPRESSION: Moderate-sized right pleural effusion with overlying atelectasis. Mild central vascular congestion without overt pulmonary edema. Electronically Signed   By: Marijo Sanes M.D.   On: 02/16/2019 14:57   US Venous Img Lower Bilateral  Result Date: 02/16/2019 CLINICAL DATA:  Bilateral lower extremity edema and pain EXAM: BILATERAL LOWER EXTREMITY VENOUS DOPPLER ULTRASOUND TECHNIQUE: Gray-scale sonography with graded compression, as well as color Doppler and duplex ultrasound were performed to evaluate the lower extremity deep venous systems from the level of the common femoral vein and including the common femoral, femoral, profunda femoral, popliteal and calf veins including the posterior tibial, peroneal and gastrocnemius veins when visible. The superficial great saphenous vein was also interrogated. Spectral Doppler was utilized to evaluate flow at rest and with distal augmentation maneuvers in the common femoral, femoral and popliteal veins. COMPARISON:  None. FINDINGS: RIGHT LOWER EXTREMITY Common Femoral Vein: No evidence of thrombus. Normal compressibility, respiratory phasicity and response to augmentation. Saphenofemoral Junction: No evidence of thrombus. Normal compressibility and flow on color Doppler imaging. Profunda Femoral Vein: No evidence of thrombus. Normal compressibility and flow on color Doppler imaging. Femoral Vein: No evidence of thrombus. Normal compressibility, respiratory phasicity and response to augmentation. Popliteal Vein: No evidence of thrombus. Normal compressibility, respiratory phasicity and response to augmentation. Calf Veins: Limited visualization of calf veins. No gross occlusive thrombus. Superficial Great Saphenous Vein: No evidence of thrombus. Normal compressibility. Venous Reflux:  Not assessed Other Findings:  None. LEFT LOWER EXTREMITY Common Femoral Vein:  No evidence of thrombus. Normal compressibility, respiratory phasicity and response to augmentation. Saphenofemoral Junction: No evidence of thrombus. Normal compressibility and flow on color Doppler imaging. Profunda Femoral Vein: No evidence of thrombus. Normal compressibility and flow on color Doppler imaging. Femoral Vein: No evidence of thrombus. Normal compressibility, respiratory phasicity and response to augmentation. Popliteal Vein: No evidence of thrombus. Normal compressibility, respiratory phasicity and response to augmentation. Calf Veins: Limited visualization of the calf veins. No gross occlusive thrombus. Superficial Great Saphenous Vein: No evidence of thrombus. Normal compressibility. Venous Reflux:  Assessed Other Findings:  None. IMPRESSION: No significant DVT in either extremity. Limited assessment of the calf veins. Electronically Signed   By: Jerilynn Mages.  Shick M.D.   On: 02/16/2019 15:33    Pending Labs Unresulted Labs (From admission, onward)    Start     Ordered   02/16/19 1645  Troponin I - Once  Once,   STAT     02/16/19 1509   02/16/19 1612  Hepatic function panel  Add-on,   AD     02/16/19 1612   02/16/19 1541  SARS Coronavirus 2 (CEPHEID - Performed in [place] hospital lab), Hosp Order  (Asymptomatic Patients Labs)  Once,   STAT    Question:  Rule Out  Answer:  Yes   02/16/19 1540   Signed and Held  Basic metabolic panel  Daily,   R     Signed and Held          Vitals/Pain Today's Vitals   02/16/19 1341 02/16/19 1349 02/16/19 1534  BP: (!) 146/56  (!) 175/84  Pulse: 63  71  Resp:   (!) 25  Temp: 98.8 F (37.1 C)    TempSrc: Oral    SpO2: 98%  97%  Weight: 78 kg    Height: 5\' 11"  (1.803 m)    PainSc:  5      Isolation Precautions No active isolations  Medications Medications  sodium chloride flush (NS) 0.9 % injection 3 mL (3 mLs Intravenous Not Given 02/16/19 1437)    Mobility walks Low fall risk   Focused Assessments Cardiac Assessment  Handoff:    Lab Results  Component Value Date   CKTOTAL 63 06/07/2013   CKMB 1.0 06/07/2013   TROPONINI 0.04 ([place]) 02/16/2019   No results found for: DDIMER Does the Patient currently have chest pain? No     R Recommendations: See Admitting Provider Note  Report given to:   Additional Notes:

## 2019-02-17 ENCOUNTER — Observation Stay: Payer: Medicare Other

## 2019-02-17 ENCOUNTER — Inpatient Hospital Stay (HOSPITAL_COMMUNITY)
Admit: 2019-02-17 | Discharge: 2019-02-17 | Disposition: A | Discharge status: Home / Self Care | Payer: Medicare Other | Attending: Family Medicine | Admitting: Family Medicine

## 2019-02-17 DIAGNOSIS — I4891 Unspecified atrial fibrillation: Secondary | ICD-10-CM | POA: Diagnosis not present

## 2019-02-17 DIAGNOSIS — I509 Heart failure, unspecified: Secondary | ICD-10-CM

## 2019-02-17 DIAGNOSIS — Z7901 Long term (current) use of anticoagulants: Secondary | ICD-10-CM | POA: Diagnosis not present

## 2019-02-17 DIAGNOSIS — Z79899 Other long term (current) drug therapy: Secondary | ICD-10-CM | POA: Diagnosis not present

## 2019-02-17 DIAGNOSIS — I5033 Acute on chronic diastolic (congestive) heart failure: Secondary | ICD-10-CM | POA: Diagnosis not present

## 2019-02-17 DIAGNOSIS — Z8249 Family history of ischemic heart disease and other diseases of the circulatory system: Secondary | ICD-10-CM | POA: Diagnosis not present

## 2019-02-17 DIAGNOSIS — I110 Hypertensive heart disease with heart failure: Secondary | ICD-10-CM | POA: Diagnosis present

## 2019-02-17 DIAGNOSIS — I361 Nonrheumatic tricuspid (valve) insufficiency: Secondary | ICD-10-CM

## 2019-02-17 DIAGNOSIS — I272 Pulmonary hypertension, unspecified: Secondary | ICD-10-CM | POA: Diagnosis not present

## 2019-02-17 DIAGNOSIS — R600 Localized edema: Secondary | ICD-10-CM | POA: Diagnosis not present

## 2019-02-17 DIAGNOSIS — Z9049 Acquired absence of other specified parts of digestive tract: Secondary | ICD-10-CM | POA: Diagnosis not present

## 2019-02-17 DIAGNOSIS — Z882 Allergy status to sulfonamides status: Secondary | ICD-10-CM | POA: Diagnosis not present

## 2019-02-17 DIAGNOSIS — Z1159 Encounter for screening for other viral diseases: Secondary | ICD-10-CM | POA: Diagnosis not present

## 2019-02-17 DIAGNOSIS — I340 Nonrheumatic mitral (valve) insufficiency: Secondary | ICD-10-CM | POA: Diagnosis not present

## 2019-02-17 DIAGNOSIS — I482 Chronic atrial fibrillation, unspecified: Secondary | ICD-10-CM | POA: Diagnosis not present

## 2019-02-17 DIAGNOSIS — I071 Rheumatic tricuspid insufficiency: Secondary | ICD-10-CM | POA: Diagnosis present

## 2019-02-17 DIAGNOSIS — E785 Hyperlipidemia, unspecified: Secondary | ICD-10-CM | POA: Diagnosis present

## 2019-02-17 DIAGNOSIS — Z951 Presence of aortocoronary bypass graft: Secondary | ICD-10-CM | POA: Diagnosis not present

## 2019-02-17 DIAGNOSIS — R001 Bradycardia, unspecified: Secondary | ICD-10-CM | POA: Diagnosis present

## 2019-02-17 DIAGNOSIS — J90 Pleural effusion, not elsewhere classified: Secondary | ICD-10-CM | POA: Diagnosis not present

## 2019-02-17 DIAGNOSIS — I4510 Unspecified right bundle-branch block: Secondary | ICD-10-CM | POA: Diagnosis present

## 2019-02-17 DIAGNOSIS — E876 Hypokalemia: Secondary | ICD-10-CM | POA: Diagnosis not present

## 2019-02-17 DIAGNOSIS — M7989 Other specified soft tissue disorders: Secondary | ICD-10-CM | POA: Diagnosis not present

## 2019-02-17 DIAGNOSIS — Z9861 Coronary angioplasty status: Secondary | ICD-10-CM | POA: Diagnosis not present

## 2019-02-17 DIAGNOSIS — E119 Type 2 diabetes mellitus without complications: Secondary | ICD-10-CM | POA: Diagnosis present

## 2019-02-17 DIAGNOSIS — I25118 Atherosclerotic heart disease of native coronary artery with other forms of angina pectoris: Secondary | ICD-10-CM | POA: Diagnosis present

## 2019-02-17 DIAGNOSIS — I878 Other specified disorders of veins: Secondary | ICD-10-CM | POA: Diagnosis present

## 2019-02-17 DIAGNOSIS — J42 Unspecified chronic bronchitis: Secondary | ICD-10-CM | POA: Diagnosis present

## 2019-02-17 LAB — HEMOGLOBIN A1C
Hgb A1c MFr Bld: 6.3 % — ABNORMAL HIGH (ref 4.8–5.6)
Mean Plasma Glucose: 134.11 mg/dL

## 2019-02-17 LAB — BASIC METABOLIC PANEL
Anion gap: 9 (ref 5–15)
BUN: 26 mg/dL — ABNORMAL HIGH (ref 8–23)
CO2: 27 mmol/L (ref 22–32)
Calcium: 8.7 mg/dL — ABNORMAL LOW (ref 8.9–10.3)
Chloride: 100 mmol/L (ref 98–111)
Creatinine, Ser: 1.12 mg/dL (ref 0.61–1.24)
GFR calc Af Amer: >60 mL/min (ref >60)
GFR calc non Af Amer: >60 mL/min (ref >60)
Glucose, Bld: 101 mg/dL — ABNORMAL HIGH (ref 70–99)
Potassium: 3.2 mmol/L — ABNORMAL LOW (ref 3.5–5.1)
Sodium: 136 mmol/L (ref 135–145)

## 2019-02-17 LAB — MAGNESIUM: Magnesium: 1.5 mg/dL — ABNORMAL LOW (ref 1.7–2.4)

## 2019-02-17 LAB — TSH: TSH: 0.648 u[IU]/mL (ref 0.350–4.500)

## 2019-02-17 MED ORDER — POTASSIUM CHLORIDE CRYS ER 10 MEQ PO TBCR: 10.0000 meq | EXTENDED_RELEASE_TABLET | Freq: Two times a day (BID) | ORAL | Status: DC

## 2019-02-17 MED ORDER — POTASSIUM CHLORIDE CRYS ER 20 MEQ PO TBCR
40.0000 meq | EXTENDED_RELEASE_TABLET | Freq: Two times a day (BID) | ORAL | Status: AC
  Administered 2019-02-17 (×2): 40 meq via ORAL
  Filled 2019-02-17: qty 2

## 2019-02-17 MED ORDER — MAGNESIUM SULFATE 2 GM/50ML IV SOLN
2.0000 g | Freq: Once | INTRAVENOUS | Status: AC
  Administered 2019-02-17: 2 g via INTRAVENOUS
  Filled 2019-02-17: qty 50

## 2019-02-17 MED ORDER — METOPROLOL TARTRATE 25 MG PO TABS
25.0000 mg | ORAL_TABLET | Freq: Two times a day (BID) | ORAL | Status: DC
  Administered 2019-02-17 – 2019-02-18 (×3): 25 mg via ORAL
  Filled 2019-02-17 (×3): qty 1

## 2019-02-17 NOTE — Progress Notes (Signed)
[facility] NAME: Jordan Perkins    MR#:  [id number]  DATE OF BIRTH:  1941/05/28  SUBJECTIVE:  patient came in with increasing leg swelling. He feels of better. Found to have right-sided pleural effusion. Received some IV Lasix.  Denies shortness of breath or chest pain. REVIEW OF SYSTEMS:   Review of Systems  Constitutional: Negative for chills, fever and weight loss.  HENT: Negative for ear discharge, ear pain and nosebleeds.   Eyes: Negative for blurred vision, pain and discharge.  Respiratory: Negative for sputum production, shortness of breath, wheezing and stridor.   Cardiovascular: Positive for leg swelling. Negative for chest pain, palpitations, orthopnea and PND.  Gastrointestinal: Negative for abdominal pain, diarrhea, nausea and vomiting.  Genitourinary: Negative for frequency and urgency.  Musculoskeletal: Negative for back pain and joint pain.  Neurological: Positive for weakness. Negative for sensory change, speech change and focal weakness.  Psychiatric/Behavioral: Negative for depression and hallucinations. The patient is not nervous/anxious.    Tolerating Diet:yes Tolerating PT: ambulatory  DRUG ALLERGIES:   Allergies  Allergen Reactions  . Sulfa Antibiotics Rash    Rash/hives     VITALS:  Blood pressure (!) 157/66, pulse 60, temperature 97.7 F (36.5 C), temperature source Oral, resp. rate 16, height 5\' 11"  (1.803 m), weight 87 kg, SpO2 98 %.  PHYSICAL EXAMINATION:   Physical Exam  GENERAL:  78 y.o.-year-old patient lying in the bed with no acute distress.  EYES: Pupils equal, round, reactive to light and accommodation. No scleral icterus. Extraocular muscles intact.  HEENT: Head atraumatic, normocephalic. Oropharynx and nasopharynx clear.  NECK:  Supple, no jugular venous distention. No thyroid enlargement, no tenderness.  LUNGS:decreased breath sounds right base, no wheezing, rales, rhonchi.  No use of accessory muscles of respiration.  CARDIOVASCULAR: S1, S2 normal. No murmurs, rubs, or gallops.  ABDOMEN: Soft, nontender, nondistended. Bowel sounds present. No organomegaly or mass.  EXTREMITIES:++edema b/l. Venous stasis changes+   NEUROLOGIC: Cranial nerves II through XII are intact. No focal Motor or sensory deficits b/l.   PSYCHIATRIC:  patient is alert and oriented x 3.  SKIN: No obvious rash, lesion, or ulcer.   LABORATORY PANEL:  CBC Recent Labs  Lab 02/16/19 1356  WBC 11.2*  HGB 11.2*  HCT 32.9*  PLT 147*    Chemistries  Recent Labs  Lab 02/16/19 1658 02/17/19 0605  NA  --  136  K  --  3.2*  CL  --  100  CO2  --  27  GLUCOSE  --  101*  BUN  --  26*  CREATININE  --  1.12  CALCIUM  --  8.7*  MG  --  1.5*  AST 32  --   ALT 19  --   ALKPHOS 64  --   BILITOT 2.5*  --    Cardiac Enzymes Recent Labs  Lab 02/16/19 1658  TROPONINI 0.04*   RADIOLOGY:  Dg Chest 2 View  Result Date: 02/16/2019 CLINICAL DATA:  Bilateral lower extremity swelling for the past 2-3 weeks. 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 The heart is upper limits of normal in size for age. The mediastinal and hilar contours appear normal. Mild central vascular congestion without overt pulmonary edema. There is a right-sided pleural effusion with overlying atelectasis. No worrisome pulmonary lesions. The bony thorax is intact. IMPRESSION: Moderate-sized right pleural effusion with overlying atelectasis. Mild central vascular congestion  without overt pulmonary edema. Electronically Signed   By: Marijo Sanes M.D.   On: 02/16/2019 14:57   US Venous Img Lower Bilateral  Result Date: 02/16/2019 CLINICAL DATA:  Bilateral lower extremity edema and pain EXAM: BILATERAL LOWER EXTREMITY VENOUS DOPPLER ULTRASOUND TECHNIQUE: Gray-scale sonography with graded compression, as well as color Doppler and duplex ultrasound were performed to  evaluate the lower extremity deep venous systems from the level of the common femoral vein and including the common femoral, femoral, profunda femoral, popliteal and calf veins including the posterior tibial, peroneal and gastrocnemius veins when visible. The superficial great saphenous vein was also interrogated. Spectral Doppler was utilized to evaluate flow at rest and with distal augmentation maneuvers in the common femoral, femoral and popliteal veins. COMPARISON:  None. FINDINGS: RIGHT LOWER EXTREMITY Common Femoral Vein: No evidence of thrombus. Normal compressibility, respiratory phasicity and response to augmentation. Saphenofemoral Junction: No evidence of thrombus. Normal compressibility and flow on color Doppler imaging. Profunda Femoral Vein: No evidence of thrombus. Normal compressibility and flow on color Doppler imaging. Femoral Vein: No evidence of thrombus. Normal compressibility, respiratory phasicity and response to augmentation. Popliteal Vein: No evidence of thrombus. Normal compressibility, respiratory phasicity and response to augmentation. Calf Veins: Limited visualization of calf veins. No gross occlusive thrombus. Superficial Great Saphenous Vein: No evidence of thrombus. Normal compressibility. Venous Reflux:  Not assessed Other Findings:  None. LEFT LOWER EXTREMITY Common Femoral Vein: No evidence of thrombus. Normal compressibility, respiratory phasicity and response to augmentation. Saphenofemoral Junction: No evidence of thrombus. Normal compressibility and flow on color Doppler imaging. Profunda Femoral Vein: No evidence of thrombus. Normal compressibility and flow on color Doppler imaging. Femoral Vein: No evidence of thrombus. Normal compressibility, respiratory phasicity and response to augmentation. Popliteal Vein: No evidence of thrombus. Normal compressibility, respiratory phasicity and response to augmentation. Calf Veins: Limited visualization of the calf veins. No gross  occlusive thrombus. Superficial Great Saphenous Vein: No evidence of thrombus. Normal compressibility. Venous Reflux:  Assessed Other Findings:  None. IMPRESSION: No significant DVT in either extremity. Limited assessment of the calf veins. Electronically Signed   By: Jerilynn Mages.  Shick M.D.   On: 02/16/2019 15:33   Dg Chest Port 1 View  Result Date: 02/17/2019 CLINICAL DATA:  Post thoracentesis EXAM: PORTABLE CHEST - 1 VIEW COMPARISON:  the previous day's study FINDINGS: No pneumothorax. Near complete resolution of the right pleural effusion. Improved aeration at the right lung base with minimal residual right infrahilar infiltrate or subsegmental atelectasis. Left lung remains clear. Heart size upper limits normal. Previous CABG. Sternotomy wires. Cholecystectomy clips. IMPRESSION: No pneumothorax post right thoracentesis. Electronically Signed   By: Lucrezia Europe M.D.   On: 02/17/2019 14:35   US Thoracentesis Asp Pleural Space W/img Guide  Result Date: 02/17/2019 INDICATION: Moderate right pleural effusion, shortness of breath EXAM: ULTRASOUND GUIDED RIGHT THORACENTESIS MEDICATIONS: Lidocaine 1% subcutaneous COMPLICATIONS: None immediate. PROCEDURE: An ultrasound guided thoracentesis was thoroughly discussed with the patient and questions answered. The benefits, risks, alternatives and complications were also discussed. The patient understands and wishes to proceed with the procedure. Written consent was obtained. Ultrasound was performed to localize and mark an adequate pocket of fluid in the right chest. The area was then prepped and draped in the normal sterile fashion. 1% Lidocaine was used for local anesthesia. Under ultrasound guidance a Safe-T-Centesis catheter was introduced. Thoracentesis was performed. The catheter was removed and a dressing applied. FINDINGS: A total of approximately 1.2 L of blood tinged fluid was removed. Samples  were sent to the laboratory as requested by the clinical team. IMPRESSION:  Successful ultrasound guided right thoracentesis removing 1.2 L of pleural fluid. Electronically Signed   By: Lucrezia Europe M.D.   On: 02/17/2019 14:32   ASSESSMENT AND PLAN:  Jordan Perkins  is a 78 y.o. male with a known history per below presents emergency room with 3-week history of worsening bilateral lower extremity edema, discomfort in the legs with walking, denies any chest pain/shortness of breath   *Acute bilateral lower extremity dependent edema due to congestive heart failure. Worsening over the last 3 weeks - strict I&O monitoring, daily weights, IV Lasix twice, TED hose to bilateral lower extremities, - -bilateral lower extremity ultrasound negative for DVT  * acute on chronic diastolic congestive heart failure exacerbation with right-sided pleural effusion Congestive heart failure protocol, strict I&O monitoring, daily weights, IV Lasix twice daily, Lopressor, lisinopril - check echocardiogram, on Xarelto -status post right sided thoracentesis was 1.2 L fluid removed -seen by cardiology  *History of atrial fibrillation Stable Continue Xarelto and metoprolol  * hypertension Slightly elevated Continue home regimen, IV hydralazine PRN systolic blood pressure greater than 160, vitals per routine, make changes as per necessary  * hyperlipidemia, unspecified Continue statin therapy   CODE STATUS: full  DVT Prophylaxis: Xarelto  TOTAL TIME TAKING CARE OF THIS PATIENT: *30* minutes.  >50% time spent on counselling and coordination of care  POSSIBLE D/C IN 1 to 2 DAYS, DEPENDING ON CLINICAL CONDITION.  Note: This dictation was prepared with Dragon dictation along with smaller phrase technology. Any transcriptional errors that result from this process are unintentional.  Fritzi Mandes M.D on 02/17/2019 at 3:39 PM  Between 7am to 6pm - Pager - [phone]  After 6pm go to [email or web address] - password [email or web address] [facility]  Office   [phone]  CC: Primary care physician; [name], MDPatient ID: Jordan Perkins, male   DOB: 1940-10-29, 78 y.o.   MRN: [id number]

## 2019-02-17 NOTE — Consult Note (Addendum)
Cardiology Consultation:   Patient ID: Jordan Perkins MRN: [id number]; DOB: [birth year]  Admit date: 02/16/2019 Date of Consult: 02/17/2019  Primary Care Provider: [name], MD Primary Cardiologist: [facility], Dr. Rockey Situ Primary Electrophysiologist:  None    Patient Profile:   Jordan Perkins is a 78 y.o. male with a hx of chronic atrial fibrillation on Xarelto, CAD s/p 2005 [facility] CABGx3 and stenting 2005, HTN, HLD, PAD, bilateral carotid artery disease and who is being seen today for the evaluation of lower extremity edema at the request of Dr. Jerelyn Charles.  History of Present Illness:   Jordan Perkins is a 78 yo male with PMH as above. 2005 CABGx3 and stenting at [facility] (unable to find additional details in EMR at this time). He underwent 2014 stress test was without ischemia and ruled low risk. 06/06/2013 echocardiogram showed EF 60-65%, mild LAE, mild to moderate tricuspid regurgitation with velocity 2.62 m/s and assumed RA pressure 10mHg, mildly elevated PASP 37.6 mmHg, and mild AS/PR. 07/2016 carotid artery scan showed 40-59% stenosis on the L and less than 39% stenosis on the right. He stated he does not drink alcohol, smoke cigarettes, or use any illegal drugs.   [facility] for c/o bilateral lower extremity edema. At this vist, he denied CP and SOB, aside from his chronic bronchitis. It was documented tha the remained active. He noted mild bilateral lower extremity edema after running out of his HCTZ and not yet refilling this medication. It was noted that he often ran out of his medications. At that time, however, he was still taking his Xarelto.   [facility] in b/l lower extremities with associated b/l LEE x2 weeks. He stated the LEE usually was not present in the AM but worsened throughout the day. He also reported pain in his b/l lower extremities when ambulating. He denied any associated chest pain at rest  or with exertion. He did report SOB but denied DOE. He reported that he limits his fluid to 2 quarts of water a day. He denied adding salt to his food and stated that he uses a "diet no salt substitute." He reported he eats "things like eggs, bacon, toast, and deer meat," and he stated he does not frequently eat out at restaurants. He was unable to identify a trigger for his worsening b/l LEE, as he was taking his HCTZ. He did note that he was not taking his Xarelto for the last week, however.  Initial labs showed Na 135, potassium 3.7, creatinine 1.27, BUN 25, BNP 532.0, troponin 0.04x2, WBC 11.2, hemoglobin 11.2, RBC 3.37. EKG showed atrial fibrillation with ventricular rate 70 bpm, TWI in inferior and all precordial leads, RAD with incomplete RBBB / IVCD. CXR showed R pleural effusion without acute abnormality. Initial vitals showed HR 63, BP 146/56, 98% ORA. Dopplers negative for DVT.    Past Medical History:  Diagnosis Date  . [place]   . Coronary artery disease   . Hyperlipidemia   . Hypertension     Past Surgical History:  Procedure Laterality Date  . CARDIAC CATHETERIZATION    . CHOLECYSTECTOMY    . CORONARY ANGIOPLASTY  06/2004   s/p stent placement @ [facility]  . CORONARY ARTERY BYPASS GRAFT  04-24-2004   CABG x 3 [facility]     Home Medications:  Prior to Admission medications   Medication Sig Start Date End Date Taking? Authorizing Provider  atorvastatin (  LIPITOR) 80 MG tablet Take 1 tablet (80 mg total) by mouth daily. 01/02/19  Yes Gollan, Kathlene November, MD  hydrochlorothiazide (HYDRODIURIL) 25 MG tablet Take 1 tablet (25 mg total) by mouth daily. 01/02/19  Yes [name], MD  lisinopril (ZESTRIL) 40 MG tablet Take 1 tablet (40 mg total) by mouth daily. 01/02/19 04/02/19 Yes Gollan, Kathlene November, MD  metoprolol tartrate (LOPRESSOR) 100 MG tablet TAKE 1 TABLET BY MOUTH TWICE A DAY 01/02/19  Yes Gollan, Kathlene November, MD  Multiple Vitamin (MULTIVITAMIN) tablet Take 1 tablet by mouth daily.   Yes  [provider]  rivaroxaban (XARELTO) 20 MG TABS tablet Take 1 tablet (20 mg total) by mouth daily with supper. 01/02/19  Yes [name], MD    Inpatient Medications: Scheduled Meds: . atorvastatin  80 mg Oral Daily  . furosemide  20 mg Intravenous BID  . lisinopril  40 mg Oral Daily  . metoprolol tartrate  25 mg Oral BID  . multivitamin with minerals  1 tablet Oral Daily  . rivaroxaban  20 mg Oral Q supper  . sodium chloride flush  3 mL Intravenous Once  . sodium chloride flush  3 mL Intravenous Q12H   Continuous Infusions: . sodium chloride     PRN Meds: sodium chloride, acetaminophen, ondansetron (ZOFRAN) IV, sodium chloride flush  Allergies:    Allergies  Allergen Reactions  . Sulfa Antibiotics Rash    Rash/hives     Social History:   Social History   Socioeconomic History  . Marital status: Single    Spouse name: Not on file  . Number of children: Not on file  . Years of education: Not on file  . Highest education level: Not on file  Occupational History  . Not on file  Social Needs  . Financial resource strain: Not on file  . Food insecurity:    Worry: Not on file    Inability: Not on file  . Transportation needs:    Medical: Not on file    Non-medical: Not on file  Tobacco Use  . Smoking status: Never Smoker  . Smokeless tobacco: Never Used  Substance and Sexual Activity  . Alcohol use: No  . Drug use: No  . Sexual activity: Not on file  Lifestyle  . Physical activity:    Days per week: Not on file    Minutes per session: Not on file  . Stress: Not on file  Relationships  . Social connections:    Talks on phone: Not on file    Gets together: Not on file    Attends religious service: Not on file    Active member of club or organization: Not on file    Attends meetings of clubs or organizations: Not on file    Relationship status: Not on file  . Intimate partner violence:    Fear of current or ex partner: Not on file     Emotionally abused: Not on file    Physically abused: Not on file    Forced sexual activity: Not on file  Other Topics Concern  . Not on file  Social History Narrative  . Not on file    Family History:    Family History  Problem Relation Age of Onset  . Heart disease Father   . Heart disease Paternal Uncle   . Heart attack Brother      ROS:  Please see the history of present illness.  Review of Systems  Constitutional: Negative  for chills, fever and malaise/fatigue.  Respiratory: Positive for shortness of breath. Negative for cough, hemoptysis and wheezing.   Cardiovascular: Positive for leg swelling. Negative for chest pain, palpitations and orthopnea.       2w b/l LEE and pain  Gastrointestinal: Negative for nausea and vomiting.  Genitourinary: Negative for hematuria.  Musculoskeletal: Negative for falls.  Neurological: Negative for weakness.  All other systems reviewed and are negative.   All other ROS reviewed and negative.     Physical Exam/Data:   Vitals:   02/16/19 1800 02/16/19 1959 02/17/19 0321 02/17/19 0738  BP:  (!) 165/86 (!) 152/71 (!) 156/70  Pulse: 66 61 (!) 55 (!) 56  Resp: (!) 23   16  Temp:  (!) 97.4 F (36.3 C) 97.7 F (36.5 C) 97.7 F (36.5 C)  TempSrc:  Oral Oral Oral  SpO2: 98% 98% 99% 100%  Weight:  88 kg 87 kg   Height:  _0  (1.803 m)      Intake/Output Summary (Last 24 hours) at 02/17/2019 0929 Last data filed at 02/17/2019 0011 Gross per 24 hour  Intake -  Output 0 ml  Net 0 ml   Filed Weights   02/16/19 1341 02/16/19 1959 02/17/19 0321  Weight: 78 kg 88 kg 87 kg   Body mass index is 26.75 kg/m.  General:  Frail elderly gentleman in NAD HEENT: normal Lymph: no adenopathy Neck: +JVD, +HJR, JVP ~10-12cm Vascular: Radial pulses 2+ bilaterally   Cardiac:  normal S1, S2; IRIR, bradycardic; no murmur  Lungs: reduced bibasilar breath sounds, worse on the R. no wheezing, rhonchi or rales  Abd: soft, nontender, no hepatomegaly   Ext: mild to 1+ bilateral LEE Musculoskeletal:  No deformities, BUE and BLE strength normal and equal Skin: warm and dry  Neuro:  CNs 2-12 intact, no focal abnormalities noted Psych:  Normal affect   EKG:  The EKG was personally reviewed and demonstrates:  IRIR Telemetry:  Telemetry was personally reviewed and demonstrates:  IRIR  Relevant CV Studies:  06/06/2013  TTE PHYSICIAN INTERPRETATION:  Left Ventricle: The left ventricular internal cavity size was normal. LV  posterior wall thickness was normal. No left ventricular hypertrophy.  Global  LV systolic function was normal. Left ventricular ejection fraction, by  visual estimation, is 60 to 65%. Spectral Doppler shows normal pattern of  LV  diastolic filling.  Right Ventricle: Normal right ventricular size, wall thickness, and  systolic  function. The right ventricular size is normal. Global RV systolic  function  is normal.  Left Atrium: The left atrium is mildly dilated.  Right Atrium: The right atrium is normal in size.  Pericardium: There is no evidence of pericardial effusion.  Mitral Valve: The mitral valve is normal in structure. Trace mitral valve  regurgitation is seen. MAC noted.  Tricuspid Valve: Mild to moderate tricuspid regurgitation is visualized.  The  tricuspid regurgitant velocity is 2.62 m/s, and with an assumed right  atrial  pressure of 10 mmHg, the estimated right ventricular systolic pressure is  mildly elevated at 37.6 mmHg.  Aortic Valve: The aortic valve is tricuspid. Mild aortic valve sclerosis  is  present, with no evidence of aortic valve stenosis.  Pulmonic Valve: The pulmonic valve is normal. Mild pulmonic valve  regurgitation.   Laboratory Data:  Chemistry Recent Labs  Lab 02/16/19 1356 02/17/19 0605  NA 135 136  K 3.7 3.2*  CL 99 100  CO2 26 27  GLUCOSE 115* 101*  BUN 25* 26*  CREATININE 1.27* 1.12  CALCIUM 8.8* 8.7*  GFRNONAA 55* >60  GFRAA >60 >60  ANIONGAP 10 9     Recent Labs  Lab 02/16/19 1658  PROT 7.5  ALBUMIN 3.8  AST 32  ALT 19  ALKPHOS 64  BILITOT 2.5*   Hematology Recent Labs  Lab 02/16/19 1356  WBC 11.2*  RBC 3.37*  HGB 11.2*  HCT 32.9*  MCV 97.6  MCH 33.2  MCHC 34.0  RDW 13.8  PLT 147*   Cardiac Enzymes Recent Labs  Lab 02/16/19 1356 02/16/19 1658  TROPONINI 0.04* 0.04*   No results for input(s): TROPIPOC in the last 168 hours.  BNP Recent Labs  Lab 02/16/19 1356  BNP 532.0*    DDimer No results for input(s): DDIMER in the last 168 hours.  Radiology/Studies:  Dg Chest 2 View  Result Date: 02/16/2019 CLINICAL DATA:  Bilateral lower extremity swelling for the past 2-3 weeks. 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 The heart is upper limits of normal in size for age. The mediastinal and hilar contours appear normal. Mild central vascular congestion without overt pulmonary edema. There is a right-sided pleural effusion with overlying atelectasis. No worrisome pulmonary lesions. The bony thorax is intact. IMPRESSION: Moderate-sized right pleural effusion with overlying atelectasis. Mild central vascular congestion without overt pulmonary edema. Electronically Signed   By: Marijo Sanes M.D.   On: 02/16/2019 14:57   US Venous Img Lower Bilateral  Result Date: 02/16/2019 CLINICAL DATA:  Bilateral lower extremity edema and pain EXAM: BILATERAL LOWER EXTREMITY VENOUS DOPPLER ULTRASOUND TECHNIQUE: Gray-scale sonography with graded compression, as well as color Doppler and duplex ultrasound were performed to evaluate the lower extremity deep venous systems from the level of the common femoral vein and including the common femoral, femoral, profunda femoral, popliteal and calf veins including the posterior tibial, peroneal and gastrocnemius veins when visible. The superficial great saphenous vein was also interrogated. Spectral Doppler was utilized to evaluate flow at  rest and with distal augmentation maneuvers in the common femoral, femoral and popliteal veins. COMPARISON:  None. FINDINGS: RIGHT LOWER EXTREMITY Common Femoral Vein: No evidence of thrombus. Normal compressibility, respiratory phasicity and response to augmentation. Saphenofemoral Junction: No evidence of thrombus. Normal compressibility and flow on color Doppler imaging. Profunda Femoral Vein: No evidence of thrombus. Normal compressibility and flow on color Doppler imaging. Femoral Vein: No evidence of thrombus. Normal compressibility, respiratory phasicity and response to augmentation. Popliteal Vein: No evidence of thrombus. Normal compressibility, respiratory phasicity and response to augmentation. Calf Veins: Limited visualization of calf veins. No gross occlusive thrombus. Superficial Great Saphenous Vein: No evidence of thrombus. Normal compressibility. Venous Reflux:  Not assessed Other Findings:  None. LEFT LOWER EXTREMITY Common Femoral Vein: No evidence of thrombus. Normal compressibility, respiratory phasicity and response to augmentation. Saphenofemoral Junction: No evidence of thrombus. Normal compressibility and flow on color Doppler imaging. Profunda Femoral Vein: No evidence of thrombus. Normal compressibility and flow on color Doppler imaging. Femoral Vein: No evidence of thrombus. Normal compressibility, respiratory phasicity and response to augmentation. Popliteal Vein: No evidence of thrombus. Normal compressibility, respiratory phasicity and response to augmentation. Calf Veins: Limited visualization of the calf veins. No gross occlusive thrombus. Superficial Great Saphenous Vein: No evidence of thrombus. Normal compressibility. Venous Reflux:  Assessed Other Findings:  None. IMPRESSION: No significant DVT in either extremity. Limited assessment of the calf veins. Electronically Signed   By: Jerilynn Mages.  Shick M.D.   On:  02/16/2019 15:33    Assessment and Plan:   Volume overload, Pulmonary HTN  - Exam consistent with volume overload and chronic venous stasis. 2w SOB/LEE. Less likely that SOB due to PE given negative doppler study and on chronic anticoagulation with interruption ~ 7 days.  - Echo from 2014 as above with nl EF, mild-mod TR, moderate elevated RVSP. - Pending updated echo. - Continue IV diuresis. Monitor I/O, daily standing weights. Titrate as needed for optimal output and as renal function allows until euvolemic on exam. - Daily BMET to monitor renal function and electrolytes. - Further recommendations pending echo. If severely reduced EF or acute structural changes, will ultimately need further ischemic workup to rule out ischemic event given risk factors including h/o CAD with CABG and stenting.  - Continue metoprolol at reduced dose of lopressor 11m BID due to bradycardia, HCTZ 265mdaily, and lisinopril 4035maily.  Hypokalemia - Replete with goal 4.0.  - Check Mg.  - Daily BMET.  Chronic Afib - IRIR with bradycardic rate on PTA metoprolol tartrate 100m51mD and Xarelto 20mg57m  - Last TSH 0.43. Will recheck TSH. - Reduced BB to metoprolol 25mg 74mgiven bradycardia. Continue BB for rate control and titrate as needed. If BP remains elevated with this reduced dose of BB, consider additional antihypertensive medication with less effect on rate and pending EF as below. - CHA2DS2VASc score of at least 6 (HFpEF HTN, agex2, DM2, vascular). - Continue anticoagulation with Xarelto 20mg d54m to prevent risk of thrombotic event. Will need to ensure he has Xarelto refills at discharge given report that he ran out of his medication for 1 week. As above, SOB d/t PE less likely as on anticoagulation until this past week and DVT doppler negative. As he is chronic Afib, no plan for cardioversion.  History of CAD s/p 2005 [facility] CABGx3 and stenting 2005 - No chest pain. - Troponin minimally elevated and flat trending. Suspect supply demand ischemia in the setting of volume overload  and elevated BP. - Pending echo as above. If EF low or acute changes on echocardiogram, further ischemic workup recommended as above.  - Continue medical management and aggressive risk factor modification. Recheck A1C and TSH as above.   HTN - BB reduced as above d/t bradycardic rate. Will monitor BP with this reduced dose of BB. If BP remains elevated, will consider additional antihypertensive including low dose amlodipine for more optimal BP support and pending echo EF. Continue BB, HCTZ, lisinopril for now.   HLD - Continue statin therapy    For questions or updates, please contact [facility] consult [email or web address] for contact info under     Signed, JacquelArvil Chaco 02/17/2019 9:29 AM

## 2019-02-18 ENCOUNTER — Telehealth: Payer: Self-pay

## 2019-02-18 ENCOUNTER — Other Ambulatory Visit: Payer: Self-pay | Admitting: *Deleted

## 2019-02-18 DIAGNOSIS — I272 Pulmonary hypertension, unspecified: Secondary | ICD-10-CM

## 2019-02-18 DIAGNOSIS — I5033 Acute on chronic diastolic (congestive) heart failure: Secondary | ICD-10-CM

## 2019-02-18 LAB — CBC WITH DIFFERENTIAL/PLATELET
Abs Immature Granulocytes: 0.03 10*3/uL (ref 0.00–0.07)
Basophils Absolute: 0.1 10*3/uL (ref 0.0–0.1)
Basophils Relative: 1 %
Eosinophils Absolute: 0.2 10*3/uL (ref 0.0–0.5)
Eosinophils Relative: 2 %
HCT: 35 % — ABNORMAL LOW (ref 39.0–52.0)
Hemoglobin: 11.5 g/dL — ABNORMAL LOW (ref 13.0–17.0)
Immature Granulocytes: 0 %
Lymphocytes Relative: 11 %
Lymphs Abs: 1 10*3/uL (ref 0.7–4.0)
MCH: 32.5 pg (ref 26.0–34.0)
MCHC: 32.9 g/dL (ref 30.0–36.0)
MCV: 98.9 fL (ref 80.0–100.0)
Monocytes Absolute: 0.6 10*3/uL (ref 0.1–1.0)
Monocytes Relative: 7 %
Neutro Abs: 7.1 10*3/uL (ref 1.7–7.7)
Neutrophils Relative %: 79 %
Platelets: 149 10*3/uL — ABNORMAL LOW (ref 150–400)
RBC: 3.54 MIL/uL — ABNORMAL LOW (ref 4.22–5.81)
RDW: 13.7 % (ref 11.5–15.5)
WBC: 8.9 10*3/uL (ref 4.0–10.5)
nRBC: 0 % (ref 0.0–0.2)

## 2019-02-18 LAB — BASIC METABOLIC PANEL
Anion gap: 10 (ref 5–15)
BUN: 30 mg/dL — ABNORMAL HIGH (ref 8–23)
CO2: 30 mmol/L (ref 22–32)
Calcium: 9 mg/dL (ref 8.9–10.3)
Chloride: 99 mmol/L (ref 98–111)
Creatinine, Ser: 1.26 mg/dL — ABNORMAL HIGH (ref 0.61–1.24)
GFR calc Af Amer: >60 mL/min (ref >60)
GFR calc non Af Amer: 55 mL/min — ABNORMAL LOW (ref >60)
Glucose, Bld: 120 mg/dL — ABNORMAL HIGH (ref 70–99)
Potassium: 3.3 mmol/L — ABNORMAL LOW (ref 3.5–5.1)
Sodium: 139 mmol/L (ref 135–145)

## 2019-02-18 LAB — ECHOCARDIOGRAM COMPLETE
Height: 71 in
Weight: 3068.8 oz

## 2019-02-18 MED ORDER — POTASSIUM CHLORIDE CRYS ER 20 MEQ PO TBCR: 20.0000 meq | EXTENDED_RELEASE_TABLET | Freq: Every day | ORAL | 0 refills | Status: DC

## 2019-02-18 MED ORDER — POTASSIUM CHLORIDE CRYS ER 20 MEQ PO TBCR: 20.0000 meq | EXTENDED_RELEASE_TABLET | Freq: Every day | ORAL | Status: DC

## 2019-02-18 MED ORDER — FUROSEMIDE 40 MG PO TABS: 40.0000 mg | ORAL_TABLET | Freq: Every day | ORAL | 2 refills | Status: DC

## 2019-02-18 MED ORDER — RIVAROXABAN 20 MG PO TABS: 20.0000 mg | ORAL_TABLET | Freq: Every day | ORAL | 4 refills | Status: DC

## 2019-02-18 MED ORDER — FUROSEMIDE 40 MG PO TABS: 40.0000 mg | ORAL_TABLET | Freq: Every day | ORAL | Status: DC

## 2019-02-18 MED ORDER — POTASSIUM CHLORIDE CRYS ER 10 MEQ PO TBCR
10.0000 meq | EXTENDED_RELEASE_TABLET | Freq: Two times a day (BID) | ORAL | Status: DC
  Administered 2019-02-18: 10 meq via ORAL
  Filled 2019-02-18: qty 1

## 2019-02-18 NOTE — Progress Notes (Signed)
Pt discharged home via private car at 1500. Pt was A&Ox4. VSS. AVS reviewed with pt and all questions were answered. Pt verbalized understanding of discharge instructions. Pt sent home with additional education on heart failure. Pt wheeled downstairs by this RN.

## 2019-02-18 NOTE — Progress Notes (Signed)
Pt ambulated with just a stand by assist around the nurses station. Pt tolerated well and showed no signs of weakness or dyspnea. O2 sats while ambulating were 95% on room air.

## 2019-02-18 NOTE — Telephone Encounter (Signed)
-----   Message from Arvil Chaco, PA-C sent at 02/18/2019  1:16 PM EDT ----- Regarding: TCM Hello,  This patient was admitted and seen at [facility] for lower extremity swelling / SOB with pulmonary HTN and pleural effusion.  We are expecting discharge today 6/10.  Can you please call and arrange / schedule for follow-up   (1) TCM appointment with Dr. Rockey Situ within the next two weeks?   (2) Follow-up BMET within 1 week?  Thank you! Signed, Arvil Chaco, PA-C 02/18/2019, 1:18 PM Pager [phone]

## 2019-02-18 NOTE — Progress Notes (Signed)
Progress Note  Patient Name: Jordan Perkins Date of Encounter: 02/18/2019  Primary Cardiologist: [facility], Dr. Rockey Situ  Subjective   No chest pain or feeling of palpitations. No near syncope or syncope.  Feels he is breathing better today and after yesterday's R sided ultrasound and thoracentesis, which yielded approximately 1.2L of blood tinged fluid. Ambulated to the restroom earlier this morning 6/10 and did not feel SOB but feel as if his legs were sore. 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   Inpatient Medications    Scheduled Meds:  atorvastatin  80 mg Oral Daily   furosemide  20 mg Intravenous BID   lisinopril  40 mg Oral Daily   metoprolol tartrate  25 mg Oral BID   multivitamin with minerals  1 tablet Oral Daily   potassium chloride  10 mEq Oral BID   rivaroxaban  20 mg Oral Q supper   sodium chloride flush  3 mL Intravenous Once   sodium chloride flush  3 mL Intravenous Q12H   Continuous Infusions:  sodium chloride     PRN Meds: sodium chloride, acetaminophen, ondansetron (ZOFRAN) IV, sodium chloride flush   Vital Signs    Vitals:   02/17/19 1623 02/17/19 1946 02/18/19 0402 02/18/19 0828  BP: (!) 153/82 (!) 155/57 133/67 (!) 149/71  Pulse: 69 68 60 (!) 52  Resp: 18 16 20 18   Temp:  98.4 F (36.9 C) 98.3 F (36.8 C) 97.8 F (36.6 C)  TempSrc:  Oral Oral Oral  SpO2: 99% 98% 94% 96%  Weight:   82.3 kg   Height:        Intake/Output Summary (Last 24 hours) at 02/18/2019 0918 Last data filed at 02/18/2019 0405 Gross per 24 hour  Intake 240 ml  Output 725 ml  Net -485 ml   Filed Weights   02/16/19 1959 02/17/19 0321 02/18/19 0402  Weight: 88 kg 87 kg 82.3 kg    Telemetry    Afib with 12 beat VT PVCs v aberrancy, ventricular rate 50-80s  - Personally Reviewed  ECG    No new tracings - Personally Reviewed  Physical Exam   GEN: No acute distress.  Lying in bed. Neck: +HJR, JVP~10cm Cardiac: IRIR, no murmurs, rubs, or gallops.    Respiratory: Wheezing, heard best at RLL. [place]ards oxygen. GI: Soft, nontender, non-distended  MS: Mild to 1+ bilateral LEE; No deformity. Neuro:  Nonfocal  Psych: Normal affect   Labs    Chemistry Recent Labs  Lab 02/16/19 1356 02/16/19 1658 02/17/19 0605 02/18/19 0419  NA 135  --  136 139  K 3.7  --  3.2* 3.3*  CL 99  --  100 99  CO2 26  --  27 30  GLUCOSE 115*  --  101* 120*  BUN 25*  --  26* 30*  CREATININE 1.27*  --  1.12 1.26*  CALCIUM 8.8*  --  8.7* 9.0  PROT  --  7.5  --   --   ALBUMIN  --  3.8  --   --   AST  --  32  --   --   ALT  --  19  --   --   ALKPHOS  --  64  --   --   BILITOT  --  2.5*  --   --   GFRNONAA 55*  --  >60 55*  GFRAA >60  --  >60 >60  ANIONGAP 10  --  9 10  Hematology Recent Labs  Lab 02/16/19 1356  WBC 11.2*  RBC 3.37*  HGB 11.2*  HCT 32.9*  MCV 97.6  MCH 33.2  MCHC 34.0  RDW 13.8  PLT 147*    Cardiac Enzymes Recent Labs  Lab 02/16/19 1356 02/16/19 1658  TROPONINI 0.04* 0.04*   No results for input(s): TROPIPOC in the last 168 hours.   BNP Recent Labs  Lab 02/16/19 1356  BNP 532.0*     DDimer No results for input(s): DDIMER in the last 168 hours.   Radiology    Dg Chest 2 View  Result Date: 02/16/2019 CLINICAL DATA:  Bilateral lower extremity swelling for the past 2-3 weeks. 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 The heart is upper limits of normal in size for age. The mediastinal and hilar contours appear normal. Mild central vascular congestion without overt pulmonary edema. There is a right-sided pleural effusion with overlying atelectasis. No worrisome pulmonary lesions. The bony thorax is intact. IMPRESSION: Moderate-sized right pleural effusion with overlying atelectasis. Mild central vascular congestion without overt pulmonary edema. Electronically Signed   By: Marijo Sanes M.D.   On: 02/16/2019 14:57   US Venous Img Lower Bilateral  Result  Date: 02/16/2019 CLINICAL DATA:  Bilateral lower extremity edema and pain EXAM: BILATERAL LOWER EXTREMITY VENOUS DOPPLER ULTRASOUND TECHNIQUE: Gray-scale sonography with graded compression, as well as color Doppler and duplex ultrasound were performed to evaluate the lower extremity deep venous systems from the level of the common femoral vein and including the common femoral, femoral, profunda femoral, popliteal and calf veins including the posterior tibial, peroneal and gastrocnemius veins when visible. The superficial great saphenous vein was also interrogated. Spectral Doppler was utilized to evaluate flow at rest and with distal augmentation maneuvers in the common femoral, femoral and popliteal veins. COMPARISON:  None. FINDINGS: RIGHT LOWER EXTREMITY Common Femoral Vein: No evidence of thrombus. Normal compressibility, respiratory phasicity and response to augmentation. Saphenofemoral Junction: No evidence of thrombus. Normal compressibility and flow on color Doppler imaging. Profunda Femoral Vein: No evidence of thrombus. Normal compressibility and flow on color Doppler imaging. Femoral Vein: No evidence of thrombus. Normal compressibility, respiratory phasicity and response to augmentation. Popliteal Vein: No evidence of thrombus. Normal compressibility, respiratory phasicity and response to augmentation. Calf Veins: Limited visualization of calf veins. No gross occlusive thrombus. Superficial Great Saphenous Vein: No evidence of thrombus. Normal compressibility. Venous Reflux:  Not assessed Other Findings:  None. LEFT LOWER EXTREMITY Common Femoral Vein: No evidence of thrombus. Normal compressibility, respiratory phasicity and response to augmentation. Saphenofemoral Junction: No evidence of thrombus. Normal compressibility and flow on color Doppler imaging. Profunda Femoral Vein: No evidence of thrombus. Normal compressibility and flow on color Doppler imaging. Femoral Vein: No evidence of thrombus.  Normal compressibility, respiratory phasicity and response to augmentation. Popliteal Vein: No evidence of thrombus. Normal compressibility, respiratory phasicity and response to augmentation. Calf Veins: Limited visualization of the calf veins. No gross occlusive thrombus. Superficial Great Saphenous Vein: No evidence of thrombus. Normal compressibility. Venous Reflux:  Assessed Other Findings:  None. IMPRESSION: No significant DVT in either extremity. Limited assessment of the calf veins. Electronically Signed   By: Jerilynn Mages.  Shick M.D.   On: 02/16/2019 15:33   Dg Chest Port 1 View  Result Date: 02/17/2019 CLINICAL DATA:  Post thoracentesis EXAM: PORTABLE CHEST - 1 VIEW COMPARISON:  the previous day's study FINDINGS: No pneumothorax. Near complete resolution of the right pleural effusion. Improved  aeration at the right lung base with minimal residual right infrahilar infiltrate or subsegmental atelectasis. Left lung remains clear. Heart size upper limits normal. Previous CABG. Sternotomy wires. Cholecystectomy clips. IMPRESSION: No pneumothorax post right thoracentesis. Electronically Signed   By: Lucrezia Europe M.D.   On: 02/17/2019 14:35   US Thoracentesis Asp Pleural Space W/img Guide  Result Date: 02/17/2019 INDICATION: Moderate right pleural effusion, shortness of breath EXAM: ULTRASOUND GUIDED RIGHT THORACENTESIS MEDICATIONS: Lidocaine 1% subcutaneous COMPLICATIONS: None immediate. PROCEDURE: An ultrasound guided thoracentesis was thoroughly discussed with the patient and questions answered. The benefits, risks, alternatives and complications were also discussed. The patient understands and wishes to proceed with the procedure. Written consent was obtained. Ultrasound was performed to localize and mark an adequate pocket of fluid in the right chest. The area was then prepped and draped in the normal sterile fashion. 1% Lidocaine was used for local anesthesia. Under ultrasound guidance a Safe-T-Centesis catheter  was introduced. Thoracentesis was performed. The catheter was removed and a dressing applied. FINDINGS: A total of approximately 1.2 L of blood tinged fluid was removed. Samples were sent to the laboratory as requested by the clinical team. IMPRESSION: Successful ultrasound guided right thoracentesis removing 1.2 L of pleural fluid. Electronically Signed   By: Lucrezia Europe M.D.   On: 02/17/2019 14:32    Cardiac Studies   TTE 02/17/2019  1. The left ventricle has hyperdynamic systolic function, with an ejection fraction of >65%. The cavity size was normal. There is mildly increased left ventricular wall thickness. Left ventricular diastolic function could not be evaluated secondary to  atrial fibrillation. There is right ventricular volume and pressure overload. No evidence of left ventricular regional wall motion abnormalities.  2. The right ventricle has moderately reduced systolic function. The cavity was moderately enlarged. There is mildly increased right ventricular wall thickness. Right ventricular systolic pressure is severely elevated with an estimated pressure of 69.8  mmHg.  3. Left atrial size was mildly dilated.  4. Right atrial size was mildly dilated.  5. The mitral valve is degenerative. Mild thickening of the mitral valve leaflet. There is moderate mitral annular calcification present.  6. Tricuspid valve regurgitation is mild-moderate.  7. The aortic valve is tricuspid. Mild thickening of the aortic valve. Mild calcification of the aortic valve. Aortic valve regurgitation is trivial by color flow Doppler.  8. The aortic root is normal in size and structure.  9. The inferior vena cava was dilated in size with <50% respiratory variability.   Patient Profile     78 y.o. male with a history of chronic atrial fibrillation on Xarelto, CAD s/p 2005 [facility] CABGx3 and stenting in 2005, HTN, HLD, PAD, bilateral carotid artery disease and who is being seen for the evaluation of lower extremity  edema.   Assessment & Plan    HFpEF, Pulmonary HTN - Improved breathing today. Reports some mild leg soreness earlier this morning but feels as if he is approaching baseline. Moderate right pleural effusion s/p 6/9 throacentesis.   -Updated echo with EF >65%, moderately reduced RVSF, moderate RV enlargement, mild RV increased wall thickness, RVSP 69.66mmHg. - I/O recorded on IV lasix as -422mL yesterday. Wt. 191.8 lbs  181.5lbs. - Daily BMET to monitor renal function and electrolytes. Slight decrease in renal function overnight with Cr 1.12  1.26. BUN 26  30 suggests patient is nearing his baseline. - Ambulate patient and assess breathing status and stability with ambulation. If successful ambulation, reasonable to discharge home with oral lasix  40mg  qd. Discontinue / stop home HCTZ. Continue lopressor 25mg  BID and lisinopril 40mg  daily. Follow-up BMET in 1 week. Follow-up TCM appointment to be scheduled as well.   Hypokalemia - Received potassium this admission. Follow-up BMET showed K 3.3; Mg 1.5. Recommend discharge home with potassium supplementation of KCl 53mEq daily. Follow-up BMET in 1 week to check renal function and electrolytes.  Chronic Afib - Bradycardic with sinus pauses at admission. Reduced BB to lopressor 25mg  BID with improvement in rate. Continue lopressor 25mg  BID at discharge, and if BP elevated at follow-up, could consider low dose amlodipine for further BP support. - CHA2DS2VASc score of at least 6 (HFpEF HTN, agex2, DM2, vascular) with recommendation for anticoagulation to prevent risk of thrombotic event. Blood tinged fluid on thoracentesis with repeat CBC today showing stable Hgb. Continue Xarelto 20mg  daily and ensure he has Xarelto 20mg  qd refills at discharge given he ran out of his medication for 1 week.   History of CAD s/p 2005 [facility] CABGx3 and stenting 2005 - No chest pain this admission. - Troponin minimally elevated and flat trending. Suspect supply demand  ischemia in the setting of volume overload and elevated BP. - Updated echo as above.  - Continue medical management and aggressive risk factor modification.    HTN - BB reduced as above d/t bradycardic rate. If BP remains elevated, consider low dose amlodipine for BP support at follow-up. For now, continue BB with lisinopril.   HLD - Continue statin therapy    For questions or updates, please contact [facility] Please consult [email or web address] for contact info under        Signed, Arvil Chaco, PA-C  02/18/2019, 9:18 AM

## 2019-02-18 NOTE — Telephone Encounter (Signed)
Virtual Visit Pre-Appointment Phone Call  "(Name), I am calling you today to discuss your upcoming appointment. We are currently trying to limit exposure to the virus that causes COVID-19 by seeing patients at home rather than in the office."  1. "What is the BEST phone number to call the day of the visit?" - include this in appointment notes  2. Do you have or have access to (through a family member/friend) a smartphone with video capability that we can use for your visit?" a. If yes - list this number in appt notes as cell (if different from BEST phone #) and list the appointment type as a VIDEO visit in appointment notes b. If no - list the appointment type as a PHONE visit in appointment notes  3. Confirm consent - "In the setting of the current Covid19 crisis, you are scheduled for a (phone or video) visit with your provider on (date) at (time).  Just as we do with many in-office visits, in order for you to participate in this visit, we must obtain consent.  If you'd like, I can send this to your mychart (if signed up) or email for you to review.  Otherwise, I can obtain your verbal consent now.  All virtual visits are billed to your insurance company just like a normal visit would be.  By agreeing to a virtual visit, we'd like you to understand that the technology does not allow for your provider to perform an examination, and thus may limit your provider's ability to fully assess your condition. If your provider identifies any concerns that need to be evaluated in person, we will make arrangements to do so.  Finally, though the technology is pretty good, we cannot assure that it will always work on either your or our end, and in the setting of a video visit, we may have to convert it to a phone-only visit.  In either situation, we cannot ensure that we have a secure connection.  Are you willing to proceed?" STAFF: Did the patient verbally acknowledge consent to telehealth visit? Document  YES/NO here: yes  4. Advise patient to be prepared - "Two hours prior to your appointment, go ahead and check your blood pressure, pulse, oxygen saturation, and your weight (if you have the equipment to check those) and write them all down. When your visit starts, your provider will ask you for this information. If you have an Apple Watch or Kardia device, please plan to have heart rate information ready on the day of your appointment. Please have a pen and paper handy nearby the day of the visit as well."  5. Give patient instructions for MyChart download to smartphone OR [email or web address] as below if video visit (depending on what platform provider is using)  6. Inform patient they will receive a phone call 15 minutes prior to their appointment time (may be from unknown caller ID) so they should be prepared to answer    TELEPHONE CALL NOTE  Jordan Perkins has been deemed a candidate for a follow-up tele-health visit to limit community exposure during the Covid-19 pandemic. I spoke with the patient via phone to ensure availability of phone/video source, confirm preferred email & phone number, and discuss instructions and expectations.  I reminded Jordan Perkins to be prepared with any vital sign and/or heart rhythm information that could potentially be obtained via home monitoring, at the time of his visit. I reminded Jordan Perkins to expect a phone call prior to his visit.  Clarisse Gouge 02/18/2019 1:57 PM   INSTRUCTIONS FOR DOWNLOADING THE MYCHART APP TO SMARTPHONE  - The patient must first make sure to have activated MyChart and know their login information - If Apple, go to [employer] and type in MyChart in the search bar and download the app. If Android, ask patient to go to [employer] and type in [place] in the search bar and download the app. The app is free but as with any other app downloads, their phone may require them to verify saved payment information or Apple/Android  password.  - The patient will need to then log into the app with their MyChart username and password, and select [facility] as their healthcare provider to link the account. When it is time for your visit, go to the MyChart app, find appointments, and click Begin Video Visit. Be sure to Select Allow for your device to access the Microphone and Camera for your visit. You will then be connected, and your provider will be with you shortly.  **If they have any issues connecting, or need assistance please contact MyChart service desk [phone])**  **If using a computer, in order to ensure the best quality for their visit they will need to use either of the following Internet Browsers: [employer], or Google Chrome**  IF USING DOXIMITY or DOXY.ME - The patient will receive a link just prior to their visit by text.     FULL LENGTH CONSENT FOR TELE-HEALTH VISIT   I hereby voluntarily request, consent and authorize [facility] and its employed or contracted physicians, physician assistants, nurse practitioners or other licensed health care professionals (the Practitioner), to provide me with telemedicine health care services (the Services") as deemed necessary by the treating Practitioner. I acknowledge and consent to receive the Services by the Practitioner via telemedicine. I understand that the telemedicine visit will involve communicating with the Practitioner through live audiovisual communication technology and the disclosure of certain medical information by electronic transmission. I acknowledge that I have been given the opportunity to request an in-person assessment or other available alternative prior to the telemedicine visit and am voluntarily participating in the telemedicine visit.  I understand that I have the right to withhold or withdraw my consent to the use of telemedicine in the course of my care at any time, without affecting my right to future care or treatment,  and that the Practitioner or I may terminate the telemedicine visit at any time. I understand that I have the right to inspect all information obtained and/or recorded in the course of the telemedicine visit and may receive copies of available information for a reasonable fee.  I understand that some of the potential risks of receiving the Services via telemedicine include:   Delay or interruption in medical evaluation due to technological equipment failure or disruption;  Information transmitted may not be sufficient (e.g. poor resolution of images) to allow for appropriate medical decision making by the Practitioner; and/or   In rare instances, security protocols could fail, causing a breach of personal health information.  Furthermore, I acknowledge that it is my responsibility to provide information about my medical history, conditions and care that is complete and accurate to the best of my ability. I acknowledge that Practitioner's advice, recommendations, and/or decision may be based on factors not within their control, such as incomplete or inaccurate data provided by me or distortions of diagnostic images or specimens that may result from electronic transmissions. I understand that the  practice of medicine is not an Chief Strategy Officer and that Practitioner makes no warranties or guarantees regarding treatment outcomes. I acknowledge that I will receive a copy of this consent concurrently upon execution via email to the email address I last provided but may also request a printed copy by calling the office of [facility].    I understand that my insurance will be billed for this visit.   I have read or had this consent read to me.  I understand the contents of this consent, which adequately explains the benefits and risks of the Services being provided via telemedicine.   I have been provided ample opportunity to ask questions regarding this consent and the Services and have had my questions  answered to my satisfaction.  I give my informed consent for the services to be provided through the use of telemedicine in my medical care  By participating in this telemedicine visit I agree to the above.

## 2019-02-18 NOTE — Discharge Summary (Signed)
[facility] NAME: Jordan Perkins    MR#:  [id number]  DATE OF BIRTH:  [birth year]  DATE OF ADMISSION:  02/16/2019 ADMITTING PHYSICIAN: Gorden Harms, MD  DATE OF DISCHARGE: 02/18/2019  PRIMARY CARE PHYSICIAN: [name], MD    ADMISSION DIAGNOSIS:  EKG abnormalities [R94.31] Coronary artery disease of native artery of native heart with stable angina pectoris ([place]) [I25.118]  DISCHARGE DIAGNOSIS:  acute on chronic diastolic congestive heart failure right pleural effusion status post thoracentesis chronic atrial fibrillation on oral anticoagulation SECONDARY DIAGNOSIS:   Past Medical History:  Diagnosis Date  . A-fib (Jordan Perkins)   . Coronary artery disease   . Hyperlipidemia   . Hypertension     HOSPITAL COURSE:   RichardSmithis a50 y.o.malewith a known history per below presents emergency room with 3-week history of worsening bilateral lower extremity edema, discomfort in the legs with walking, denies any chest pain/shortness of breath  *Acute bilateral lower extremity dependent edema due to congestive heart failure. -Worsening over the last 3 weeks - strict I&O monitoring, daily weights, IV Lasix twice-- change to oral Lasix daily by cardiology 40 mg along with potassium - TED hose to bilateral lower extremities -bilateral lower extremity ultrasound negative for DVT  * acute on chronic diastolic congestive heart failure exacerbation with right-sided pleural effusion --strict I&O monitoring, daily weights, IV Lasix twice daily-- to oral Lasix, Lopressor, lisinopril -status post right sided thoracentesis was 1.2 L fluid removed -seen by cardiology--dr End  *History of atrial fibrillation Stable Continue Xarelto and metoprolol  * hypertension -continue lisinopril and metoprolol -discontinued hydrochlorothiazide  * hyperlipidemia, unspecified Continue statin therapy  Overall hemodynamically  stable. Patient will discharged to home. Prescription called in to his pharmacy. He will have virtual tele visit with Dr. Rockey Situ which has been set up.  CONSULTS OBTAINED:  Treatment Team:  Wellington Hampshire, MD  DRUG ALLERGIES:   Allergies  Allergen Reactions  . Sulfa Antibiotics Rash    Rash/hives     DISCHARGE MEDICATIONS:   Allergies as of 02/18/2019      Reactions   Sulfa Antibiotics Rash   Rash/hives      Medication List    STOP taking these medications   hydrochlorothiazide 25 MG tablet Commonly known as:  HYDRODIURIL     TAKE these medications   atorvastatin 80 MG tablet Commonly known as:  LIPITOR Take 1 tablet (80 mg total) by mouth daily.   furosemide 40 MG tablet Commonly known as:  LASIX Take 1 tablet (40 mg total) by mouth daily. Start taking on:  February 19, 2019   lisinopril 40 MG tablet Commonly known as:  ZESTRIL Take 1 tablet (40 mg total) by mouth daily.   metoprolol tartrate 100 MG tablet Commonly known as:  LOPRESSOR TAKE 1 TABLET BY MOUTH TWICE A DAY   multivitamin tablet Take 1 tablet by mouth daily.   potassium chloride SA 20 MEQ tablet Commonly known as:  K-DUR Take 1 tablet (20 mEq total) by mouth daily. Start taking on:  February 19, 2019   rivaroxaban 20 MG Tabs tablet Commonly known as:  Xarelto Take 1 tablet (20 mg total) by mouth daily with supper.       If you experience worsening of your admission symptoms, develop shortness of breath, life threatening emergency, suicidal or homicidal thoughts you must seek medical attention immediately by calling 911 or calling your MD immediately  if symptoms less severe.  You  Must read complete instructions/literature along with all the possible adverse reactions/side effects for all the Medicines you take and that have been prescribed to you. Take any new Medicines after you have completely understood and accept all the possible adverse reactions/side effects.   Please note  You were  cared for by a hospitalist during your hospital stay. If you have any questions about your discharge medications or the care you received while you were in the hospital after you are discharged, you can call the unit and asked to speak with the hospitalist on call if the hospitalist that took care of you is not available. Once you are discharged, your primary care physician will handle any further medical issues. Please note that NO REFILLS for any discharge medications will be authorized once you are discharged, as it is imperative that you return to your primary care physician (or establish a relationship with a primary care physician if you do not have one) for your aftercare needs so that they can reassess your need for medications and monitor your lab values. Today   SUBJECTIVE   Feels better. Ambulating in the hallways without any problem. VITAL SIGNS:  Blood pressure (!) 149/71, pulse (!) 52, temperature 97.8 F (36.6 C), temperature source Oral, resp. rate 18, height 5\' 11"  (1.803 m), weight 82.3 kg, SpO2 96 %.  I/O:    Intake/Output Summary (Last 24 hours) at 02/18/2019 1406 Last data filed at 02/18/2019 1051 Gross per 24 hour  Intake 3 ml  Output 1525 ml  Net -1522 ml    PHYSICAL EXAMINATION:  GENERAL:  78 y.o.-year-old patient lying in the bed with no acute distress.  EYES: Pupils equal, round, reactive to light and accommodation. No scleral icterus. Extraocular muscles intact.  HEENT: Head atraumatic, normocephalic. Oropharynx and nasopharynx clear.  NECK:  Supple, no jugular venous distention. No thyroid enlargement, no tenderness.  LUNGS: Normal breath sounds bilaterally, no wheezing, rales,rhonchi or crepitation. No use of accessory muscles of respiration.  CARDIOVASCULAR: S1, S2 normal. No murmurs, rubs, or gallops.  ABDOMEN: Soft, non-tender, non-distended. Bowel sounds present. No organomegaly or mass.  EXTREMITIES: chronic bilateral lower extremity edema with venous  stasis changes. NEUROLOGIC: Cranial nerves II through XII are intact. Muscle strength 5/5 in all extremities. Sensation intact. Gait not checked.  PSYCHIATRIC: The patient is alert and oriented x 3.  SKIN: No obvious rash, lesion, or ulcer.   DATA REVIEW:   CBC  Recent Labs  Lab 02/18/19 1019  WBC 8.9  HGB 11.5*  HCT 35.0*  PLT 149*    Chemistries  Recent Labs  Lab 02/16/19 1658 02/17/19 0605 02/18/19 0419  NA  --  136 139  K  --  3.2* 3.3*  CL  --  100 99  CO2  --  27 30  GLUCOSE  --  101* 120*  BUN  --  26* 30*  CREATININE  --  1.12 1.26*  CALCIUM  --  8.7* 9.0  MG  --  1.5*  --   AST 32  --   --   ALT 19  --   --   ALKPHOS 64  --   --   BILITOT 2.5*  --   --     Microbiology Results   Recent Results (from the past 240 hour(s))  SARS Coronavirus 2 (CEPHEID - Performed in [place] hospital lab), Hosp Order     Status: None   Collection Time: 02/16/19  4:58 PM  Result Value Ref Range  Status   SARS Coronavirus 2 NEGATIVE NEGATIVE Final    Comment: (NOTE) If result is NEGATIVE SARS-CoV-2 target nucleic acids are NOT DETECTED. The SARS-CoV-2 RNA is generally detectable in upper and lower  respiratory specimens during the acute phase of infection. The lowest  concentration of SARS-CoV-2 viral copies this assay can detect is 250  copies / mL. A negative result does not preclude SARS-CoV-2 infection  and should not be used as the sole basis for treatment or other  patient management decisions.  A negative result may occur with  improper specimen collection / handling, submission of specimen other  than nasopharyngeal swab, presence of viral mutation(s) within the  areas targeted by this assay, and inadequate number of viral copies  (<250 copies / mL). A negative result must be combined with clinical  observations, patient history, and epidemiological information. If result is POSITIVE SARS-CoV-2 target nucleic acids are DETECTED. The SARS-CoV-2 RNA is  generally detectable in upper and lower  respiratory specimens dur ing the acute phase of infection.  Positive  results are indicative of active infection with SARS-CoV-2.  Clinical  correlation with patient history and other diagnostic information is  necessary to determine patient infection status.  Positive results do  not rule out bacterial infection or co-infection with other viruses. If result is PRESUMPTIVE POSTIVE SARS-CoV-2 nucleic acids MAY BE PRESENT.   A presumptive positive result was obtained on the submitted specimen  and confirmed on repeat testing.  While 2019 novel coronavirus  (SARS-CoV-2) nucleic acids may be present in the submitted sample  additional confirmatory testing may be necessary for epidemiological  and / or clinical management purposes  to differentiate between  SARS-CoV-2 and other Sarbecovirus currently known to infect humans.  If clinically indicated additional testing with an alternate test  methodology [phone]) is advised. The SARS-CoV-2 RNA is generally  detectable in upper and lower respiratory sp ecimens during the acute  phase of infection. The expected result is Negative. Fact Sheet for Patients:  StrictlyIdeas.no Fact Sheet for Healthcare Providers: BankingDealers.co.za This test is not yet approved or cleared by the [place] FDA and has been authorized for detection and/or diagnosis of SARS-CoV-2 by FDA under an Emergency Use Authorization (EUA).  This EUA will remain in effect (meaning this test can be used) for the duration of the COVID-19 declaration under Section 564(b)(1) of the Act, 21 U.S.C. section 360bbb-3(b)(1), unless the authorization is terminated or revoked sooner. Performed at [facility], [address] [place], [place] [address]     RADIOLOGY:  Dg Chest 2 View  Result Date: 02/16/2019 CLINICAL DATA:  Bilateral lower extremity swelling for the past 2-3  weeks. 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 The heart is upper limits of normal in size for age. The mediastinal and hilar contours appear normal. Mild central vascular congestion without overt pulmonary edema. There is a right-sided pleural effusion with overlying atelectasis. No worrisome pulmonary lesions. The bony thorax is intact. IMPRESSION: Moderate-sized right pleural effusion with overlying atelectasis. Mild central vascular congestion without overt pulmonary edema. Electronically Signed   By: Marijo Sanes M.D.   On: 02/16/2019 14:57   US Venous Img Lower Bilateral  Result Date: 02/16/2019 CLINICAL DATA:  Bilateral lower extremity edema and pain EXAM: BILATERAL LOWER EXTREMITY VENOUS DOPPLER ULTRASOUND TECHNIQUE: Gray-scale sonography with graded compression, as well as color Doppler and duplex ultrasound were performed to evaluate the lower extremity deep venous systems  from the level of the common femoral vein and including the common femoral, femoral, profunda femoral, popliteal and calf veins including the posterior tibial, peroneal and gastrocnemius veins when visible. The superficial great saphenous vein was also interrogated. Spectral Doppler was utilized to evaluate flow at rest and with distal augmentation maneuvers in the common femoral, femoral and popliteal veins. COMPARISON:  None. FINDINGS: RIGHT LOWER EXTREMITY Common Femoral Vein: No evidence of thrombus. Normal compressibility, respiratory phasicity and response to augmentation. Saphenofemoral Junction: No evidence of thrombus. Normal compressibility and flow on color Doppler imaging. Profunda Femoral Vein: No evidence of thrombus. Normal compressibility and flow on color Doppler imaging. Femoral Vein: No evidence of thrombus. Normal compressibility, respiratory phasicity and response to augmentation. Popliteal Vein: No evidence of thrombus. Normal compressibility,  respiratory phasicity and response to augmentation. Calf Veins: Limited visualization of calf veins. No gross occlusive thrombus. Superficial Great Saphenous Vein: No evidence of thrombus. Normal compressibility. Venous Reflux:  Not assessed Other Findings:  None. LEFT LOWER EXTREMITY Common Femoral Vein: No evidence of thrombus. Normal compressibility, respiratory phasicity and response to augmentation. Saphenofemoral Junction: No evidence of thrombus. Normal compressibility and flow on color Doppler imaging. Profunda Femoral Vein: No evidence of thrombus. Normal compressibility and flow on color Doppler imaging. Femoral Vein: No evidence of thrombus. Normal compressibility, respiratory phasicity and response to augmentation. Popliteal Vein: No evidence of thrombus. Normal compressibility, respiratory phasicity and response to augmentation. Calf Veins: Limited visualization of the calf veins. No gross occlusive thrombus. Superficial Great Saphenous Vein: No evidence of thrombus. Normal compressibility. Venous Reflux:  Assessed Other Findings:  None. IMPRESSION: No significant DVT in either extremity. Limited assessment of the calf veins. Electronically Signed   By: Jerilynn Mages.  Shick M.D.   On: 02/16/2019 15:33   Dg Chest Port 1 View  Result Date: 02/17/2019 CLINICAL DATA:  Post thoracentesis EXAM: PORTABLE CHEST - 1 VIEW COMPARISON:  the previous day's study FINDINGS: No pneumothorax. Near complete resolution of the right pleural effusion. Improved aeration at the right lung base with minimal residual right infrahilar infiltrate or subsegmental atelectasis. Left lung remains clear. Heart size upper limits normal. Previous CABG. Sternotomy wires. Cholecystectomy clips. IMPRESSION: No pneumothorax post right thoracentesis. Electronically Signed   By: Lucrezia Europe M.D.   On: 02/17/2019 14:35   US Thoracentesis Asp Pleural Space W/img Guide  Result Date: 02/17/2019 INDICATION: Moderate right pleural effusion, shortness of  breath EXAM: ULTRASOUND GUIDED RIGHT THORACENTESIS MEDICATIONS: Lidocaine 1% subcutaneous COMPLICATIONS: None immediate. PROCEDURE: An ultrasound guided thoracentesis was thoroughly discussed with the patient and questions answered. The benefits, risks, alternatives and complications were also discussed. The patient understands and wishes to proceed with the procedure. Written consent was obtained. Ultrasound was performed to localize and mark an adequate pocket of fluid in the right chest. The area was then prepped and draped in the normal sterile fashion. 1% Lidocaine was used for local anesthesia. Under ultrasound guidance a Safe-T-Centesis catheter was introduced. Thoracentesis was performed. The catheter was removed and a dressing applied. FINDINGS: A total of approximately 1.2 L of blood tinged fluid was removed. Samples were sent to the laboratory as requested by the clinical team. IMPRESSION: Successful ultrasound guided right thoracentesis removing 1.2 L of pleural fluid. Electronically Signed   By: Lucrezia Europe M.D.   On: 02/17/2019 14:32     CODE STATUS:     Code Status Orders  (From admission, onward)         Start     Ordered  02/16/19 1951  Full code  Continuous     02/16/19 1950        Code Status History    Date Active Date Inactive Code Status Order ID Comments User Context   10/29/2016 1702 11/01/2016 1934 Full Code [id number]  Epifanio Lesches, MD ED      TOTAL TIME TAKING CARE OF THIS PATIENT: *40 minutes.    Fritzi Mandes M.D on 02/18/2019 at 2:06 PM  Between 7am to 6pm - Pager - [phone] After 6pm go to [email or web address] - password [email or web address] [facility]  Office  [phone]  CC: Primary care physician; [name], MD

## 2019-02-18 NOTE — Telephone Encounter (Signed)
TCM-Message forwarded to scheduling to schedule.

## 2019-02-18 NOTE — Telephone Encounter (Signed)
Attempted to call patient to do TCM review with him. No answer, no voicemail set up.

## 2019-02-18 NOTE — TOC Transition Note (Signed)
Transition of Care [facility]) - CM/SW Discharge Note   Patient Details  Name: Taylon Coole MRN: [id number] Date of Birth: [birth year]  Transition of Care [facility] CM/SW Contact:  Ross Ludwig, LCSW Phone Number: 02/18/2019, 2:18 PM   Clinical Narrative:     Patient is a 78 year old male who is alert and oriented x4.  Patient is agreeable to home health nursing.  CSW spoke to patient and gave him choice, and he said he did not have a preference.  CSW contacted [facility], and they have accepted the referral.  They will have patient on the HF Clinical Pathway program.  Patient did not have any other issues or concerns.   Final next level of care: [place] Barriers to Discharge: No Barriers Identified   Patient Goals and CMS Choice Patient states their goals for this hospitalization and ongoing recovery are:: To return back home CMS Medicare.gov Compare Post Acute Care list provided to:: Patient Choice offered to / list presented to : Patient  Discharge Placement               Patient discharging back home with home health nursing.        Discharge Plan and Services                DME Arranged: N/A DME Agency: NA       HH Arranged: RN [place] Agency: [facility] Date HH Agency Contacted: 02/18/19 Time [place]: 0813 Representative spoke with at [place]: [place]) Interventions     Readmission Risk Interventions No flowsheet data found.

## 2019-02-18 NOTE — Progress Notes (Signed)
Pulmonary Rehab Navigator/ Exercise Physiologist Note  CHF Education:?? Educational session with patient completed.  Provided patient with "Living Better with Heart Failure" packet. Briefly reviewed definition of heart failure and signs and symptoms of an exacerbation. This EP discussed potential causes of CHF. This EP explained to patient that HF is a chronic illness which requires self-assessment / self-management along with help from the cardiologist/PCP.  ? *Reviewed importance of and reason behind checking weight daily in the AM, after using the bathroom, but before getting dressed.  Patient does have functioning scales at home and weighs every other day but does not record his weight. Patient is going to start recording his weight and self-assessment on calendar provided daily.  *Reviewed with patient the following information: *Discussed when to call the Dr= weight gain of >2-3lb overnight of 5lb in a week,  *Discussed yellow zone= call MD: weight gain of >2-3lb overnight of 5lb in a week, increased swelling, increased SOB when lying down, chest discomfort, dizziness, increased fatigue *Red Zone= call 911: struggle to breath, fainting or near fainting, significant chest pain ?  *Heart Failure Zone Magnet given and reviewed with patient.   ? *Diet - Patient currently ordered heart healthy.  Instructed patient to follow a low sodium diet of 2000 mg or less.  Recommended foods for low sodium heart healthy nutrition nutrition therapy discussed.This EP reviewed with patient steps to reading a food label with close attention to serving size and mg of sodium.   ? *Discussed fluid intake with patient as well. Patient not currently on a fluid restriction, but advised no more than 64 ounces of fluid per day. This EP demonstrated this volume to patient using the bedside water pitcher.   ? *Instructed patient to take medications as prescribed for heart failure. Explained briefly why patient is on the  medications (either make you feel better, live longer or keep you out of the hospital) and discussed monitoring and side effects.  ? *Discussed exercise / activity. Patient is active but does not exercise. Patient likes to garden. Patient does not usually use any assistive devices. This EP encouraged patient to be as active as possible. Patient was given letter, and Pulmonary Rehab brochure. Patient is not interested in participating in our Virtual Rehab program due to feeling more comfortable in an outpatient setting and lack of equipment (for [employer] app). ? *Smoking Cessation- Patient is a NEVER smoker.  *[facility] Failure [facility] - Explained the purpose of the HF Clinic. Explained to patient the HF Clinic does not replace PCP nor Cardiologist, but is an additional resource to helping patient manage heart failure at home. Patient appt scheduled for 02/23/19 at 9:20am.   ? Again, the 5 Steps to Living Better with Heart Failure were reviewed with patient.   Patient thanked me for providing the above information. ?  Jasper Loser, [facility]  Exercise Physiologist Department Phone #: [phone] Fax: [phone]  Direct Line [phone] Email Address: [email or web address]

## 2019-02-19 ENCOUNTER — Telehealth: Payer: Self-pay

## 2019-02-19 ENCOUNTER — Telehealth: Payer: Self-pay | Admitting: Cardiovascular Disease

## 2019-02-19 NOTE — Telephone Encounter (Signed)
   TELEPHONE CALL NOTE  This patient has been deemed a candidate for follow-up tele-health visit to limit community exposure during the Covid-19 pandemic. I spoke with the patient via phone to discuss instructions. The patient was advised to review the section on consent for treatment as well. The patient will receive a phone call 2-3 days prior to their E-Visit at which time consent will be verbally confirmed. A Virtual Office Visit appointment type has been scheduled for 02/23/2019 with Darylene Price FNP.  Vonda Antigua L, CMA 02/19/2019 3:12 PM

## 2019-02-19 NOTE — Telephone Encounter (Signed)
[facility] would like to draw pt labs there. Please advise. Fax [phone]

## 2019-02-19 NOTE — Telephone Encounter (Signed)
Patient contacted regarding discharge from [facility] on 02/18/19.  Patient understands to follow up with provider Sharolyn Douglas on 03/04/19 at 10 am at [facility]. Patient understands discharge instructions? yes Patient understands medications and regiment? yes Patient understands to bring all medications to this visit? yes

## 2019-02-19 NOTE — Telephone Encounter (Signed)
TELEPHONE CALL NOTE  Jordan Perkins has been deemed a candidate for a follow-up tele-health visit to limit community exposure during the Covid-19 pandemic. I spoke with the patient via phone to ensure availability of phone/video source, confirm preferred email & phone number, discuss instructions and expectations, and review consent.   I reminded Jordan Perkins to be prepared with any vital sign and/or heart rhythm information that could potentially be obtained via home monitoring, at the time of his visit.  Finally, I reminded Jordan Perkins to expect an e-mail containing a link for their video-based visit approximately 15 minutes before his visit, or alternatively, a phone call at the time of his visit if his visit is planned to be a phone encounter.  Did the patient verbally consent to treatment as below? YES  Jordan Perkins, CMA 02/19/2019 3:12 PM  CONSENT FOR TELE-HEALTH VISIT - PLEASE REVIEW  I hereby voluntarily request, consent and authorize [facility] and its employed or contracted physicians, physician assistants, nurse practitioners or other licensed health care professionals (the Practitioner), to provide me with telemedicine health care services (the "Services") as deemed necessary by the treating Practitioner. I acknowledge and consent to receive the Services by the Practitioner via telemedicine. I understand that the telemedicine visit will involve communicating with the Practitioner through telephonic communication technology and the disclosure of certain medical information by electronic transmission. I acknowledge that I have been given the opportunity to request an in-person assessment or other available alternative prior to the telemedicine visit and am voluntarily participating in the telemedicine visit.  I understand that I have the right to withhold or withdraw my consent to the use of telemedicine in the course of my care at any time, without affecting my right  to future care or treatment, and that the Practitioner or I may terminate the telemedicine visit at any time. I understand that I have the right to inspect all information obtained and/or recorded in the course of the telemedicine visit and may receive copies of available information for a reasonable fee.  I understand that some of the potential risks of receiving the Services via telemedicine include:  Marland Kitchen Delay or interruption in medical evaluation due to technological equipment failure or disruption; . Information transmitted may not be sufficient (e.g. poor resolution of images) to allow for appropriate medical decision making by the Practitioner; and/or  . In rare instances, security protocols could fail, causing a breach of personal health information.  Furthermore, I acknowledge that it is my responsibility to provide information about my medical history, conditions and care that is complete and accurate to the best of my ability. I acknowledge that Practitioner's advice, recommendations, and/or decision may be based on factors not within their control, such as incomplete or inaccurate data provided by me or lack of visual representation. I understand that the practice of medicine is not an exact science and that Practitioner makes no warranties or guarantees regarding treatment outcomes. I acknowledge that I will receive a copy of this consent concurrently upon execution via email to the email address I last provided but may also request a printed copy by calling the office of [facility].    I understand that my insurance may be billed for this visit.   I have read or had this consent read to me. . I understand the contents of this consent, which adequately explains the benefits and risks of the Services being provided via telemedicine.  . I have been  provided ample opportunity to ask questions regarding this consent and the Services and have had my questions answered to my  satisfaction. . I give my informed consent for the services to be provided through the use of telemedicine in my medical care  By participating in this telemedicine visit I agree to the above.

## 2019-02-23 ENCOUNTER — Telehealth: Payer: Self-pay | Admitting: Family

## 2019-02-23 ENCOUNTER — Ambulatory Visit: Payer: Medicare Other | Attending: Family | Admitting: Family

## 2019-02-23 ENCOUNTER — Other Ambulatory Visit: Payer: Self-pay

## 2019-02-23 NOTE — Telephone Encounter (Signed)
Did we order labs?

## 2019-02-23 NOTE — Telephone Encounter (Signed)
Called patient at 9:25am and again at 9:38am for his telemedicine visit at [facility] on 02/23/2019. Neither time did patient answer his phone and no voice mail was set up. Will attempt to reschedule.

## 2019-02-25 ENCOUNTER — Telehealth: Payer: Self-pay | Admitting: Cardiovascular Disease

## 2019-02-25 DIAGNOSIS — I110 Hypertensive heart disease with heart failure: Secondary | ICD-10-CM | POA: Diagnosis not present

## 2019-02-25 DIAGNOSIS — I480 Paroxysmal atrial fibrillation: Secondary | ICD-10-CM | POA: Diagnosis not present

## 2019-02-25 DIAGNOSIS — I2510 Atherosclerotic heart disease of native coronary artery without angina pectoris: Secondary | ICD-10-CM | POA: Diagnosis not present

## 2019-02-25 DIAGNOSIS — I5031 Acute diastolic (congestive) heart failure: Secondary | ICD-10-CM | POA: Diagnosis not present

## 2019-02-25 NOTE — Telephone Encounter (Signed)
[facility] nurse states patient did not get labwork when he was D/C from hospital. Please call and advise if [facility] can draw the labs and which ones.

## 2019-02-25 NOTE — Telephone Encounter (Signed)
Spoke with Francoise Schaumann with home health. She states that on patients discharge paperwork it mentioned having labs done prior to upcoming appointments but it did not specify which labs nor who or where he would have those done or ordering provider. Reviewed chart and Dr. Rockey Situ didn't recall any labs needed but will review hospital information to see if I can find any mention of this. Let her know that I would call her back if I can find out if in fact we need labs. She verbalized understanding with no further questions.

## 2019-02-25 NOTE — Telephone Encounter (Signed)
Spoke with Lorriane Shire RN with [facility] and after chart review I do see where they mentioned to have BMP done in one week. Provided her with verbal order and they will draw those labs this week. Requested that they fax order request so provider can sign this week if possible. She verbalized understanding with no further questions at this time.

## 2019-02-26 NOTE — Telephone Encounter (Signed)
I did not see this fax come in yesterday.

## 2019-02-26 NOTE — Telephone Encounter (Signed)
No labs have come at this time.

## 2019-02-27 DIAGNOSIS — I5031 Acute diastolic (congestive) heart failure: Secondary | ICD-10-CM | POA: Diagnosis not present

## 2019-02-27 DIAGNOSIS — I110 Hypertensive heart disease with heart failure: Secondary | ICD-10-CM | POA: Diagnosis not present

## 2019-02-27 DIAGNOSIS — I2510 Atherosclerotic heart disease of native coronary artery without angina pectoris: Secondary | ICD-10-CM | POA: Diagnosis not present

## 2019-02-27 DIAGNOSIS — I480 Paroxysmal atrial fibrillation: Secondary | ICD-10-CM | POA: Diagnosis not present

## 2019-03-03 ENCOUNTER — Telehealth: Payer: Self-pay | Admitting: Family Medicine

## 2019-03-03 ENCOUNTER — Telehealth: Payer: Self-pay

## 2019-03-03 DIAGNOSIS — I5031 Acute diastolic (congestive) heart failure: Secondary | ICD-10-CM | POA: Diagnosis not present

## 2019-03-03 DIAGNOSIS — I110 Hypertensive heart disease with heart failure: Secondary | ICD-10-CM | POA: Diagnosis not present

## 2019-03-03 DIAGNOSIS — I2510 Atherosclerotic heart disease of native coronary artery without angina pectoris: Secondary | ICD-10-CM | POA: Diagnosis not present

## 2019-03-03 DIAGNOSIS — I480 Paroxysmal atrial fibrillation: Secondary | ICD-10-CM | POA: Diagnosis not present

## 2019-03-03 NOTE — Telephone Encounter (Signed)

## 2019-03-03 NOTE — Telephone Encounter (Signed)
°  Peter with [facility] a PT came out to see patient for eval and will see him    1 x 3   [phone]    Call to req verbal can  lvm

## 2019-03-04 ENCOUNTER — Encounter: Payer: Self-pay | Admitting: Physician Assistant

## 2019-03-04 ENCOUNTER — Ambulatory Visit: Payer: Medicare Other | Admitting: Physician Assistant

## 2019-03-04 DIAGNOSIS — I480 Paroxysmal atrial fibrillation: Secondary | ICD-10-CM | POA: Diagnosis not present

## 2019-03-04 DIAGNOSIS — I110 Hypertensive heart disease with heart failure: Secondary | ICD-10-CM | POA: Diagnosis not present

## 2019-03-04 DIAGNOSIS — I5031 Acute diastolic (congestive) heart failure: Secondary | ICD-10-CM | POA: Diagnosis not present

## 2019-03-04 DIAGNOSIS — I2510 Atherosclerotic heart disease of native coronary artery without angina pectoris: Secondary | ICD-10-CM | POA: Diagnosis not present

## 2019-03-06 DIAGNOSIS — I2510 Atherosclerotic heart disease of native coronary artery without angina pectoris: Secondary | ICD-10-CM | POA: Diagnosis not present

## 2019-03-06 DIAGNOSIS — I5031 Acute diastolic (congestive) heart failure: Secondary | ICD-10-CM | POA: Diagnosis not present

## 2019-03-06 DIAGNOSIS — I110 Hypertensive heart disease with heart failure: Secondary | ICD-10-CM | POA: Diagnosis not present

## 2019-03-06 DIAGNOSIS — I480 Paroxysmal atrial fibrillation: Secondary | ICD-10-CM | POA: Diagnosis not present

## 2019-03-06 NOTE — Telephone Encounter (Signed)
Peter with [facility] a PT came out to see patient for eval and will see him    1 x 3   [phone]    Call to req verbal can  lvm

## 2019-03-06 NOTE — Telephone Encounter (Signed)
Please find out who ordered the home health.  This patient needs to be scheduled for follow-up as I have not seen him in greater than 2 years.  I can determine if I can give verbal orders once he is scheduled for follow-up and you determine who ordered the home health.

## 2019-03-07 DIAGNOSIS — I2510 Atherosclerotic heart disease of native coronary artery without angina pectoris: Secondary | ICD-10-CM | POA: Diagnosis not present

## 2019-03-07 DIAGNOSIS — I480 Paroxysmal atrial fibrillation: Secondary | ICD-10-CM | POA: Diagnosis not present

## 2019-03-07 DIAGNOSIS — I5031 Acute diastolic (congestive) heart failure: Secondary | ICD-10-CM | POA: Diagnosis not present

## 2019-03-07 DIAGNOSIS — I110 Hypertensive heart disease with heart failure: Secondary | ICD-10-CM | POA: Diagnosis not present

## 2019-03-09 DIAGNOSIS — I2510 Atherosclerotic heart disease of native coronary artery without angina pectoris: Secondary | ICD-10-CM | POA: Diagnosis not present

## 2019-03-09 DIAGNOSIS — I110 Hypertensive heart disease with heart failure: Secondary | ICD-10-CM | POA: Diagnosis not present

## 2019-03-09 DIAGNOSIS — I5031 Acute diastolic (congestive) heart failure: Secondary | ICD-10-CM | POA: Diagnosis not present

## 2019-03-09 DIAGNOSIS — I480 Paroxysmal atrial fibrillation: Secondary | ICD-10-CM | POA: Diagnosis not present

## 2019-03-09 NOTE — Telephone Encounter (Signed)
I think they were requesting orders verbally for the patient because no one here gave a verbal order to him. I did call and speak to patient and he made a telephone visit with you for tomorrow, patient cannot do a virtual.  Nina,cma

## 2019-03-09 NOTE — Telephone Encounter (Signed)
Noted. You can given them verbal orders now that he has scheduled a follow-up visit.

## 2019-03-10 ENCOUNTER — Encounter: Payer: Self-pay | Admitting: Family Medicine

## 2019-03-10 ENCOUNTER — Ambulatory Visit (INDEPENDENT_AMBULATORY_CARE_PROVIDER_SITE_OTHER): Payer: Medicare Other | Admitting: Family Medicine

## 2019-03-10 ENCOUNTER — Other Ambulatory Visit: Payer: Self-pay

## 2019-03-10 DIAGNOSIS — D539 Nutritional anemia, unspecified: Secondary | ICD-10-CM | POA: Insufficient documentation

## 2019-03-10 DIAGNOSIS — I4891 Unspecified atrial fibrillation: Secondary | ICD-10-CM

## 2019-03-10 DIAGNOSIS — D649 Anemia, unspecified: Secondary | ICD-10-CM | POA: Diagnosis not present

## 2019-03-10 DIAGNOSIS — I10 Essential (primary) hypertension: Secondary | ICD-10-CM

## 2019-03-10 DIAGNOSIS — I5033 Acute on chronic diastolic (congestive) heart failure: Secondary | ICD-10-CM

## 2019-03-10 NOTE — Assessment & Plan Note (Signed)
Possibly related to anemia of chronic disease given chronicity.  We will recheck a CBC with next labs.

## 2019-03-10 NOTE — Assessment & Plan Note (Addendum)
Patient is doing quite well in recovery from this.  He is asymptomatic at this time.  He will continue his current regimen and see cardiology later this week as planned.  Plan for BMP at the time he sees cardiology.  Orders placed for him to have this done at the hospital.  Pleural effusion was likely related to volume overload.

## 2019-03-10 NOTE — Progress Notes (Signed)
Virtual Visit via telephone Note  This visit type was conducted due to national recommendations for restrictions regarding the COVID-19 pandemic (e.g. social distancing).  This format is felt to be most appropriate for this patient at this time.  All issues noted in this document were discussed and addressed.  No physical exam was performed (except for noted visual exam findings with Video Visits).   I connected with Jordan Perkins today at  9:00 AM EDT by telephone and verified that I am speaking with the correct person using two identifiers. Location patient: home Location provider: home office Persons participating in the virtual visit: patient, provider  I discussed the limitations, risks, security and privacy concerns of performing an evaluation and management service by telephone and the availability of in person appointments. I also discussed with the patient that there may be a patient responsible charge related to this service. The patient expressed understanding and agreed to proceed.  Interactive audio and video telecommunications were attempted between this provider and patient, however failed, due to patient having technical difficulties OR patient did not have access to video capability.  We continued and completed visit with audio only.  Reason for visit: follow-up  HPI: Heart failure exacerbation: Patient recently hospitalized for heart failure exacerbation.  He was placed on Lasix and was diuresed.  He did have a right pleural effusion and underwent thoracentesis with removal of 1.2 L of fluid.  He is doing well from that procedure.  He progressively improved and was discharged.  He continues on Lasix daily at home.  His edema is significantly better.  No shortness of breath, orthopnea, or PND.  He has not been wearing TED hose.  He follows up with cardiology later this week.  Hypertension: They discontinued his HCTZ when they placed him on Lasix.  He continues on lisinopril and  metoprolol.  He notes his blood pressures been 120s over 60s.  No chest pain.  A. fib: Taking Xarelto.  No palpitations.  Also on metoprolol.  Anemia: Found to be slightly anemic on labs.  Appears to be relatively stable compared to prior results.  Prediabetes: Found to have an A1c of 6.3.     ROS: See pertinent positives and negatives per HPI.  Past Medical History:  Diagnosis Date  . (HFpEF) heart failure with preserved ejection fraction ([place])   . Coronary artery disease   . Hyperlipidemia   . Hypertension   . PAH (pulmonary artery hypertension) ([place])   . Permanent atrial fibrillation   . Pleural effusion     Past Surgical History:  Procedure Laterality Date  . CARDIAC CATHETERIZATION    . CHOLECYSTECTOMY    . CORONARY ANGIOPLASTY  06/2004   s/p stent placement @ [facility]  . CORONARY ARTERY BYPASS GRAFT  04-24-2004   CABG x 3 [facility]    Family History  Problem Relation Age of Onset  . Heart disease Father   . Heart disease Paternal Uncle   . Heart attack Brother     SOCIAL HX: Non-smoker.   Current Outpatient Medications:  .  atorvastatin (LIPITOR) 80 MG tablet, Take 1 tablet (80 mg total) by mouth daily., Disp: 90 tablet, Rfl: 3 .  furosemide (LASIX) 40 MG tablet, Take 1 tablet (40 mg total) by mouth daily., Disp: 30 tablet, Rfl: 2 .  lisinopril (ZESTRIL) 40 MG tablet, Take 1 tablet (40 mg total) by mouth daily., Disp: 90 tablet, Rfl: 3 .  metoprolol tartrate (LOPRESSOR) 100 MG tablet, TAKE 1 TABLET BY  MOUTH TWICE A DAY, Disp: 180 tablet, Rfl: 3 .  Multiple Vitamin (MULTIVITAMIN) tablet, Take 1 tablet by mouth daily., Disp: , Rfl:  .  potassium chloride SA (K-DUR) 20 MEQ tablet, Take 1 tablet (20 mEq total) by mouth daily., Disp: 30 tablet, Rfl: 0 .  rivaroxaban (XARELTO) 20 MG TABS tablet, Take 1 tablet (20 mg total) by mouth daily with supper., Disp: 90 tablet, Rfl: 4  EXAM: This is a telehealth telephone visit and thus no physical exam was completed.  ASSESSMENT  AND PLAN:  Discussed the following assessment and plan:  Acute on chronic heart failure with preserved ejection fraction (HFpEF) ([place]) Patient is doing quite well in recovery from this.  He is asymptomatic at this time.  He will continue his current regimen and see cardiology later this week as planned.  Plan for BMP at the time he sees cardiology.  Orders placed for him to have this done at the hospital.  Pleural effusion was likely related to volume overload.  Essential hypertension Adequately controlled.  Continue current regimen.  Atrial fibrillation No palpitations.  He will continue his current regimen.  Anemia Possibly related to anemia of chronic disease given chronicity.  We will recheck a CBC with next labs.  Social distancing precautions and sick precautions given regarding COVID-19.   I discussed the assessment and treatment plan with the patient. The patient was provided an opportunity to ask questions and all were answered. The patient agreed with the plan and demonstrated an understanding of the instructions.   The patient was advised to call back or seek an in-person evaluation if the symptoms worsen or if the condition fails to improve as anticipated.  I provided 11 minutes of non-face-to-face time during this encounter.   [name], MD

## 2019-03-10 NOTE — Assessment & Plan Note (Signed)
No palpitations.  He will continue his current regimen.

## 2019-03-10 NOTE — Assessment & Plan Note (Signed)
Adequately controlled.  Continue current regimen. 

## 2019-03-11 DIAGNOSIS — I110 Hypertensive heart disease with heart failure: Secondary | ICD-10-CM | POA: Diagnosis not present

## 2019-03-11 DIAGNOSIS — I480 Paroxysmal atrial fibrillation: Secondary | ICD-10-CM | POA: Diagnosis not present

## 2019-03-11 DIAGNOSIS — I2510 Atherosclerotic heart disease of native coronary artery without angina pectoris: Secondary | ICD-10-CM | POA: Diagnosis not present

## 2019-03-11 DIAGNOSIS — I5031 Acute diastolic (congestive) heart failure: Secondary | ICD-10-CM | POA: Diagnosis not present

## 2019-03-11 NOTE — Telephone Encounter (Signed)
Verbal order given to Collier Salina with [facility] for PT over the phone today.  Nina,cma

## 2019-03-12 ENCOUNTER — Ambulatory Visit: Payer: Medicare Other | Admitting: Cardiovascular Disease

## 2019-03-12 DIAGNOSIS — I5031 Acute diastolic (congestive) heart failure: Secondary | ICD-10-CM | POA: Diagnosis not present

## 2019-03-12 DIAGNOSIS — I2510 Atherosclerotic heart disease of native coronary artery without angina pectoris: Secondary | ICD-10-CM | POA: Diagnosis not present

## 2019-03-12 DIAGNOSIS — I480 Paroxysmal atrial fibrillation: Secondary | ICD-10-CM | POA: Diagnosis not present

## 2019-03-12 DIAGNOSIS — I110 Hypertensive heart disease with heart failure: Secondary | ICD-10-CM | POA: Diagnosis not present

## 2019-03-12 NOTE — Progress Notes (Deleted)
Cardiology Office Note  Date:  03/12/2019   ID:  Jordan Perkins, DOB Apr 05, 1941, MRN [id number]  PCP:  [name], MD   No chief complaint on file.   HPI:   Jordan Perkins is a pleasant 78 year old gentleman  past medical history of  CAD,  bypass surgery, 2005 at [facility] stents in 2005,  hypertension,  hyperlipidemia,  Chronic atrial fibrillation , on xarelto hospital with shortness of breath and chest pain, rapid atrial fibrillation.  Stress test 2014 showed no ischemia Echocardiogram showed ejection fraction 60-65% Carotid 07/2016: 40 to 59% stenosis on left, <39% on the right Who presents for follow up of his CAD and atrial fibrillation  Last telemetry visit April 2020 At that time was having leg swelling, had run out of his HCTZ Denies shortness of breath, had chronic bronchitis no chest pain Tolerating Xarelto, no GI bleeding    No recent hospital admissions Lives with brother  No chest pain, No SOB, chronic bronchitis active  Mild swelling in legs, Ran out of HCTZ.  Has not had this refilled yet Prior history of running out of his medications  Tolerating anticoagulation, On xarelto 20 mg daily No GI bleeding  Active,no regular exercise program Used to Make banjos,plays the banjo  Other past medical history reviewed Hospital admission 10/2016 Hyponatremia, weakness, elevated T.Bili ([place])  hospital admission in 2014, noted to have gallstones.  negative Hida scan. Now s/p Cholecystectomy 11/14  CT scan in the hospital of his abdomen and pelvis showed acute pancreatitis with increased fluid density in the peri-pancreatic fat, multiple gallstones, small hiatal hernia  Hospital admission 10/2016 Hyponatremia, weakness, elevated T.Bili ([place])  Lab work through primary care, last evaluated February 2018  Active, no regular exercise program Makes banjos ,   plays the banjo  Reports he has been out of his medications for 1 week [facility] told  [facility] to call [facility]   Tolerating anticoagulation, On xarelto 20 mg daily No GI bleeding Denies any shortness breath, chest pain on exertion  EKG personally reviewed by myself on todays visit shows atrial fibrillation with rate 56 bpm, no significant ST or T-wave changes  Other past medical history   hospital admission in 2014, noted to have gallstones.   negative Hida scan. Now s/p  Cholecystectomy 11/14  Echocardiogram showed ejection fraction 60-65%, mild to moderate TR, moderately elevated right ventricular systolic pressures He was started on anticoagulation, xarelto 20 mg daily for his atrial fibrillation.   CT scan in the hospital of his abdomen and pelvis showed acute pancreatitis with increased fluid density in the peri-pancreatic fat, multiple gallstones, small hiatal hernia  PMH:   has a past medical history of (HFpEF) heart failure with preserved ejection fraction ([place]), Coronary artery disease, Hyperlipidemia, Hypertension, PAH (pulmonary artery hypertension) ([place]), Permanent atrial fibrillation, and Pleural effusion.  PSH:    Past Surgical History:  Procedure Laterality Date  . CARDIAC CATHETERIZATION    . CHOLECYSTECTOMY    . CORONARY ANGIOPLASTY  06/2004   s/p stent placement @ [facility]  . CORONARY ARTERY BYPASS GRAFT  04-24-2004   CABG x 3 [facility]    Current Outpatient Medications  Medication Sig Dispense Refill  . atorvastatin (LIPITOR) 80 MG tablet Take 1 tablet (80 mg total) by mouth daily. 90 tablet 3  . furosemide (LASIX) 40 MG tablet Take 1 tablet (40 mg total) by mouth daily. 30 tablet 2  . lisinopril (ZESTRIL) 40 MG tablet Take 1 tablet (40 mg total) by mouth daily. [place]  tablet 3  . metoprolol tartrate (LOPRESSOR) 100 MG tablet TAKE 1 TABLET BY MOUTH TWICE A DAY 180 tablet 3  . Multiple Vitamin (MULTIVITAMIN) tablet Take 1 tablet by mouth daily.    . potassium chloride SA (K-DUR) 20 MEQ tablet Take 1 tablet (20 mEq total) by mouth daily. 30 tablet 0  .  rivaroxaban (XARELTO) 20 MG TABS tablet Take 1 tablet (20 mg total) by mouth daily with supper. 90 tablet 4   No current facility-administered medications for this visit.      Allergies:   Sulfa antibiotics   Social History:  The patient  reports that he has never smoked. He has never used smokeless tobacco. He reports that he does not drink alcohol or use drugs.   Family History:   family history includes Heart attack in his brother; Heart disease in his father and paternal uncle.    Review of Systems: Review of Systems  Constitutional: Negative.   Respiratory: Negative.   Cardiovascular: Negative.   Gastrointestinal: Negative.   Musculoskeletal: Negative.   Neurological: Negative.   Psychiatric/Behavioral: Negative.   All other systems reviewed and are negative.    PHYSICAL EXAM: VS:  There were no vitals taken for this visit. , BMI There is no height or weight on file to calculate BMI.   GEN: Well nourished, well developed, in no acute distress  HEENT: normal  Neck: no JVD,2+ left side  carotid bruits, no masses Cardiac: Irregularly irregular, no murmurs, rubs, or gallops,no edema  Respiratory:  clear to auscultation bilaterally, normal work of breathing GI: soft, nontender, nondistended, + BS MS: no deformity or atrophy  Skin: warm and dry, no rash Neuro:  Strength and sensation are intact Psych: euthymic mood, full affect    Recent Labs: 02/16/2019: ALT 19; B Natriuretic Peptide 532.0 02/17/2019: Magnesium 1.5; TSH 0.648 02/18/2019: BUN 30; Creatinine, Ser 1.26; Hemoglobin 11.5; Platelets 149; Potassium 3.3; Sodium 139    Lipid Panel Lab Results  Component Value Date   CHOL 89 (L) 04/06/2015   HDL 40 04/06/2015   LDLCALC 36 04/06/2015   TRIG 67 04/06/2015      Wt Readings from Last 3 Encounters:  02/18/19 181 lb 8 oz (82.3 kg)  09/23/17 185 lb 8 oz (84.1 kg)  01/14/17 192 lb (87.1 kg)       ASSESSMENT AND PLAN:   Left carotid bruit Carotid  07/2016: 40 to 59% stenosis on left, <39% on the right Repeat scan in late 2019, 2020  Coronary artery disease involving native coronary artery of native heart without angina pectoris Currently with no symptoms of angina. No further workup at this time. Continue current medication regimen.stable  S/P CABG (coronary artery bypass graft) Currently with no symptoms of angina No further testing meds renewed  Essential hypertension Elevated, not taking meds, ran out meds have been renewed.  Pure hypercholesterolemia Will defer repeat cholesterol panel to primary care on annual visit Goal LD <70  Chronic atrial fibrillation (HCC) Asymptomatic, rate relatively well-controlled on metoprolol On xarelto, no complications   Total encounter time more than 25 minutes  Greater than 50% was spent in counseling and coordination of care with the patient   Disposition:   F/U  6 months   No orders of the defined types were placed in this encounter.    Signed, Esmond Plants, M.D., Ph.D. 03/12/2019  [place], [place]

## 2019-03-16 DIAGNOSIS — I110 Hypertensive heart disease with heart failure: Secondary | ICD-10-CM | POA: Diagnosis not present

## 2019-03-16 DIAGNOSIS — I480 Paroxysmal atrial fibrillation: Secondary | ICD-10-CM | POA: Diagnosis not present

## 2019-03-16 DIAGNOSIS — I5031 Acute diastolic (congestive) heart failure: Secondary | ICD-10-CM | POA: Diagnosis not present

## 2019-03-16 DIAGNOSIS — I2510 Atherosclerotic heart disease of native coronary artery without angina pectoris: Secondary | ICD-10-CM | POA: Diagnosis not present

## 2019-03-17 DIAGNOSIS — I2510 Atherosclerotic heart disease of native coronary artery without angina pectoris: Secondary | ICD-10-CM | POA: Diagnosis not present

## 2019-03-17 DIAGNOSIS — I5031 Acute diastolic (congestive) heart failure: Secondary | ICD-10-CM | POA: Diagnosis not present

## 2019-03-17 DIAGNOSIS — I480 Paroxysmal atrial fibrillation: Secondary | ICD-10-CM | POA: Diagnosis not present

## 2019-03-17 DIAGNOSIS — I110 Hypertensive heart disease with heart failure: Secondary | ICD-10-CM | POA: Diagnosis not present

## 2019-05-22 ENCOUNTER — Other Ambulatory Visit: Payer: Self-pay | Admitting: Cardiovascular Disease

## 2019-05-22 MED ORDER — RIVAROXABAN 20 MG PO TABS: 20.0000 mg | ORAL_TABLET | Freq: Every day | ORAL | 1 refills | Status: DC

## 2019-05-22 NOTE — Telephone Encounter (Signed)
Refill Request.  

## 2019-05-22 NOTE — Telephone Encounter (Signed)
Last OV 01/02/2019 Scr 1.26 on 02/18/2019 ABW crcl 59 ml/min Weight 82kg Xarelto 20mg  sent to pharmacy

## 2019-05-22 NOTE — Telephone Encounter (Signed)
°*  STAT* If patient is at the pharmacy, call can be transferred to refill team.   1. Which medications need to be refilled? (please list name of each medication and dose if known) Xarelto 20 MG 1 daily with supper  2. Which pharmacy/location (including street and city if local pharmacy) is medication to be sent to? [facility] [place]   3. Do they need a 30 day or 90 day supply? 90 day

## 2019-05-28 ENCOUNTER — Ambulatory Visit (INDEPENDENT_AMBULATORY_CARE_PROVIDER_SITE_OTHER): Payer: Medicare Other

## 2019-05-28 ENCOUNTER — Other Ambulatory Visit: Payer: Self-pay

## 2019-05-28 DIAGNOSIS — Z0000 Encounter for general adult medical examination without abnormal findings: Secondary | ICD-10-CM

## 2019-05-28 NOTE — Progress Notes (Signed)
Subjective:   Jordan Perkins is a 78 y.o. male who presents for an Initial Medicare Annual Wellness Visit.  Review of Systems  No ROS.  Medicare Wellness Virtual Visit.  Visual/audio telehealth visit, UTA vital signs.   See social history for additional risk factors.   Cardiac Risk Factors include: advanced age (>43men, >81 women);male gender;hypertension    Objective:    Today's Vitals   There is no height or weight on file to calculate BMI.  Advanced Directives 05/28/2019 02/16/2019 02/16/2019 10/29/2016  Does Patient Have a Medical Advance Directive? No No No No  Would patient like information on creating a medical advance directive? No - Patient declined No - Patient declined No - Patient declined No - Patient declined    Current Medications (verified) Outpatient Encounter Medications as of 05/28/2019  Medication Sig  . atorvastatin (LIPITOR) 80 MG tablet Take 1 tablet (80 mg total) by mouth daily.  . furosemide (LASIX) 40 MG tablet Take 1 tablet (40 mg total) by mouth daily.  . metoprolol tartrate (LOPRESSOR) 100 MG tablet TAKE 1 TABLET BY MOUTH TWICE A DAY  . Multiple Vitamin (MULTIVITAMIN) tablet Take 1 tablet by mouth daily.  . potassium chloride SA (K-DUR) 20 MEQ tablet Take 1 tablet (20 mEq total) by mouth daily.  . rivaroxaban (XARELTO) 20 MG TABS tablet Take 1 tablet (20 mg total) by mouth daily with supper.  Marland Kitchen lisinopril (ZESTRIL) 40 MG tablet Take 1 tablet (40 mg total) by mouth daily.   No facility-administered encounter medications on file as of 05/28/2019.     Allergies (verified) Sulfa antibiotics   History: Past Medical History:  Diagnosis Date  . (HFpEF) heart failure with preserved ejection fraction ([place])   . Coronary artery disease   . Hyperlipidemia   . Hypertension   . PAH (pulmonary artery hypertension) ([place])   . Permanent atrial fibrillation   . Pleural effusion    Past Surgical History:  Procedure Laterality Date  . CARDIAC  CATHETERIZATION    . CHOLECYSTECTOMY    . CORONARY ANGIOPLASTY  06/2004   s/p stent placement @ [facility]  . 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 Heart disease Father   . Heart disease Paternal Uncle   . Heart attack Brother    Social History   Socioeconomic History  . Marital status: Single    Spouse name: Not on file  . Number of children: Not on file  . Years of education: Not on file  . Highest education level: Not on file  Occupational History  . Not on file  Social Needs  . Financial resource strain: Not hard at all  . Food insecurity    Worry: Never true    Inability: Never true  . Transportation needs    Medical: No    Non-medical: No  Tobacco Use  . Smoking status: Never Smoker  . Smokeless tobacco: Never Used  Substance and Sexual Activity  . Alcohol use: No  . Drug use: No  . Sexual activity: Not on file  Lifestyle  . Physical activity    Days per week: 0 days    Minutes per session: Not on file  . Stress: Not at all  Relationships  . Social Herbalist on phone: Not on file    Gets together: Not on file    Attends religious service: Not on file  Active member of club or organization: Not on file    Attends meetings of clubs or organizations: Not on file    Relationship status: Not on file  Other Topics Concern  . Not on file  Social History Narrative  . Not on file   Tobacco Counseling Counseling given: Not Answered   Clinical Intake:  Pre-visit preparation completed: Yes        Diabetes: No  How often do you need to have someone help you when you read instructions, pamphlets, or other written materials from your doctor or pharmacy?: 1 - Never  Interpreter Needed?: No     Activities of Daily Living In your present state of health, do you have any difficulty performing the following activities: 05/28/2019 02/16/2019  Hearing? N N  Vision? N N  Difficulty  concentrating or making decisions? N N  Walking or climbing stairs? N N  Dressing or bathing? N N  Doing errands, shopping? Y N  Comment He does not drive Facilities manager and eating ? N -  Using the Toilet? N -  In the past six months, have you accidently leaked urine? N -  Do you have problems with loss of bowel control? N -  Managing your Medications? N -  Managing your Finances? N -  Housekeeping or managing your Housekeeping? N -  Some recent data might be hidden     Immunizations and Health Maintenance Immunization History  Administered Date(s) Administered  . Influenza,inj,Quad PF,6+ Mos 07/12/2016, 09/23/2017  . Pneumococcal Polysaccharide-23 11/01/2016   Health Maintenance Due  Topic Date Due  . URINE MICROALBUMIN  07/23/1953  . TETANUS/TDAP  07/23/1962    Patient Care Team: [name], MD as PCP - General (Family Medicine) Rockey Situ Kathlene November, MD as Consulting Physician (Cardiology)  Indicate any recent Medical Services you may have received from other than [facility] providers in the past year (date may be approximate).    Assessment:   This is a routine wellness examination for Jordan Perkins.  I connected with patient 05/28/19 at 11:30 AM EDT by an audio enabled telemedicine application and verified that I am speaking with the correct person using two identifiers. Patient stated full name and DOB. Patient gave permission to continue with virtual visit. Patient's location was at home and Nurse's location was at [place] office.   Health Maintenance Due: -Influenza vaccine 2020- discussed; to be completed in season with doctor or local pharmacy.   -PNA - discussed; to be completed with doctor in visit or local pharmacy.  -Tdap- discussed; to be completed with doctor in visit or local pharmacy.   Colonoscopy- aged out Cologuard- follow up with pcp Update all pending maintenance due as appropriate.   See completed HM at the end of note.   Eye: Visual acuity not  assessed. Virtual visit. Wears corrective lenses.  Retinopathy- none reported.  Dental: UTD  Hearing: Demonstrates normal hearing during visit.  Safety:  Patient feels safe at home- yes Patient does have smoke detectors at home- yes Patient does wear sunscreen or protective clothing when in direct sunlight - yes Patient does wear seat belt when in a moving vehicle - yes Patient drives- no Adequate lighting in walkways free from debris- yes Grab bars and handrails used as appropriate- yes Ambulates with no assistive device. Cane PRN.  Social: Alcohol intake - no      Smoking history- never   Smokers in home? none Illicit drug use? none  Depression: PHQ 2 &9 complete.  See screening below. Denies irritability, anhedonia, sadness/tearfullness.  Stable.   Falls: See screening below.    Medication: Taking as directed and without issues.   Covid-19: Precautions and sickness symptoms discussed. Wears mask, social distancing, hand hygiene as appropriate.   Activities of Daily Living Patient denies needing assistance with: household chores, feeding themselves, getting from bed to chair, getting to the toilet, bathing/showering, dressing, managing money, or preparing meals.   Memory: Patient is alert. Patient denies difficulty focusing or concentrating. Correctly identified the president of the [place], season and recall.  Patient likes to read a little for brain stimulation.  BMI- discussed the importance of a healthy diet, water intake and the benefits of aerobic exercise.  Educational material provided.  Physical activity- gardening. No routine.   Diet: Lean meats Water: good intake Caffeine: 1 cups of coffee  Advanced Directive: End of life planning; Advanced aging; Advanced directives discussed.  No HCPOA/Living Will.  Additional information declined at this time.  Other Providers Patient Care Team: [name], MD as PCP - General (Family Medicine) Rockey Situ  Kathlene November, MD as Consulting Physician (Cardiology)  Hearing/Vision screen  Hearing Screening   125Hz  250Hz  500Hz  1000Hz  2000Hz  3000Hz  4000Hz  6000Hz  8000Hz   Right ear:           Left ear:           Comments: Patient is able to hear conversational tones without difficulty.  No issues reported.  Vision Screening Comments: Visual acuity not assessed, virtual visit.  .     Dietary issues and exercise activities discussed:    Goals    . Follow up with Primary Care Provider     Complete health maintenance at next appointment      Depression Screen PHQ 2/9 Scores 05/28/2019 11/05/2016  PHQ - 2 Score 0 0    Fall Risk Fall Risk  05/28/2019 11/05/2016  Falls in the past year? 0 Yes  Number falls in past yr: - 1  Injury with Fall? - No  Follow up - Falls evaluation completed    Timed Get Up and Go performed: no, virtual visit  Cognitive Function:     6CIT Screen 05/28/2019  What Year? 0 points  What month? 0 points  What time? 0 points  Count back from 20 0 points  Months in reverse 0 points  Repeat phrase 2 points  Total Score 2    Screening Tests Health Maintenance  Topic Date Due  . URINE MICROALBUMIN  07/23/1953  . TETANUS/TDAP  07/23/1962  . INFLUENZA VACCINE  12/09/2019 (Originally 04/11/2019)  . COLONOSCOPY  05/27/2020 (Originally 07/23/1993)  . PNA vac Low Risk Adult (2 of 2 - PCV13) 05/27/2020 (Originally 11/01/2017)      Plan:   Keep all routine maintenance appointments.   Follow up 06/12/19 @ 1:15  Medicare Attestation I have personally reviewed: The patient's medical and social history Their use of alcohol, tobacco or illicit drugs Their current medications and supplements The patient's functional ability including ADLs,fall risks, home safety risks, cognitive, and hearing and visual impairment Diet and physical activities Evidence for depression   In addition, I have reviewed and discussed with patient certain preventive protocols, quality metrics,  and best practice recommendations. A written personalized care plan for preventive services as well as general preventive health recommendations were provided to patient via mail.     Varney Biles, LPN   [id number]

## 2019-05-28 NOTE — Patient Instructions (Addendum)
  Jordan Perkins , Thank you for taking time to come for your Medicare Wellness Visit. I appreciate your ongoing commitment to your health goals. Please review the following plan we discussed and let me know if I can assist you in the future.   These are the goals we discussed: Goals    . Follow up with Primary Care Provider     Complete health maintenance at next appointment       This is a list of the screening recommended for you and due dates:  Health Maintenance  Topic Date Due  . Urine Protein Check  07/23/1953  . Tetanus Vaccine  07/23/1962  . Flu Shot  12/09/2019*  . Colon Cancer Screening  05/27/2020*  . Pneumonia vaccines (2 of 2 - PCV13) 05/27/2020*  *Topic was postponed. The date shown is not the original due date.

## 2019-06-12 ENCOUNTER — Ambulatory Visit (INDEPENDENT_AMBULATORY_CARE_PROVIDER_SITE_OTHER): Payer: Medicare Other | Admitting: Family Medicine

## 2019-06-12 ENCOUNTER — Encounter: Payer: Self-pay | Admitting: Family Medicine

## 2019-06-12 ENCOUNTER — Other Ambulatory Visit: Payer: Self-pay

## 2019-06-12 VITALS — Ht 71.0 in | Wt 192.0 lb

## 2019-06-12 DIAGNOSIS — I739 Peripheral vascular disease, unspecified: Secondary | ICD-10-CM | POA: Diagnosis not present

## 2019-06-12 DIAGNOSIS — I5032 Chronic diastolic (congestive) heart failure: Secondary | ICD-10-CM

## 2019-06-12 DIAGNOSIS — I4891 Unspecified atrial fibrillation: Secondary | ICD-10-CM

## 2019-06-12 DIAGNOSIS — I10 Essential (primary) hypertension: Secondary | ICD-10-CM

## 2019-06-12 DIAGNOSIS — R7303 Prediabetes: Secondary | ICD-10-CM | POA: Insufficient documentation

## 2019-06-12 DIAGNOSIS — E782 Mixed hyperlipidemia: Secondary | ICD-10-CM

## 2019-06-12 DIAGNOSIS — D649 Anemia, unspecified: Secondary | ICD-10-CM

## 2019-06-12 MED ORDER — TETANUS-DIPHTHERIA TOXOIDS TD 5-2 LFU IM INJ: 0.5000 mL | INJECTION | Freq: Once | INTRAMUSCULAR | 0 refills | Status: AC

## 2019-06-12 NOTE — Assessment & Plan Note (Signed)
Check CBC 

## 2019-06-12 NOTE — Assessment & Plan Note (Signed)
Asymptomatic.  He will continue Lasix.  We will check lab work.

## 2019-06-12 NOTE — Progress Notes (Signed)
Virtual Visit via telephone Note  This visit type was conducted due to national recommendations for restrictions regarding the COVID-19 pandemic (e.g. social distancing).  This format is felt to be most appropriate for this patient at this time.  All issues noted in this document were discussed and addressed.  No physical exam was performed (except for noted visual exam findings with Video Visits).   I connected with [name] today at  1:15 PM EDT by telephone and verified that I am speaking with the correct person using two identifiers. Location patient: home Location provider: work Persons participating in the virtual visit: patient, provider  I discussed the limitations, risks, security and privacy concerns of performing an evaluation and management service by telephone and the availability of in person appointments. I also discussed with the patient that there may be a patient responsible charge related to this service. The patient expressed understanding and agreed to proceed.  Interactive audio and video telecommunications were attempted between this provider and patient, however failed, due to patient having technical difficulties OR patient did not have access to video capability.  We continued and completed visit with audio only.   Reason for visit: follow-up  HPI: HYPERTENSION  Disease Monitoring  Home BP Monitoring 117/57  Chest pain- no    Dyspnea- no Medications  Compliance-  Taking HCTZ, lasix, lisinopril, metoprolol. Lightheadedness-  no  Edema- no No orthopnea or PND.  HYPERLIPIDEMIA Symptoms Chest pain on exertion:  no   Leg claudication:   Mild soreness in calves with walking Medications: Compliance- taking lipitor Right upper quadrant pain- no  Muscle aches- no  Afib: taking xarelto and metoprolol. No palpitations or bleeding.     ROS: See pertinent positives and negatives per HPI.  Past Medical History:  Diagnosis Date  . (HFpEF) heart failure with  preserved ejection fraction ([place])   . Coronary artery disease   . Hyperlipidemia   . Hypertension   . PAH (pulmonary artery hypertension) ([place])   . Permanent atrial fibrillation ([place])   . Pleural effusion     Past Surgical History:  Procedure Laterality Date  . CARDIAC CATHETERIZATION    . CHOLECYSTECTOMY    . CORONARY ANGIOPLASTY  06/2004   s/p stent placement @ [facility]  . CORONARY ARTERY BYPASS GRAFT  04-24-2004   CABG x 3 [facility]    Family History  Problem Relation Age of Onset  . Heart disease Father   . Heart disease Paternal Uncle   . Heart attack Brother     SOCIAL HX: Non-smoker   Current Outpatient Medications:  .  atorvastatin (LIPITOR) 80 MG tablet, Take 1 tablet (80 mg total) by mouth daily., Disp: 90 tablet, Rfl: 3 .  furosemide (LASIX) 40 MG tablet, Take 1 tablet (40 mg total) by mouth daily., Disp: 30 tablet, Rfl: 2 .  hydrochlorothiazide (HYDRODIURIL) 25 MG tablet, Take 25 mg by mouth daily., Disp: , Rfl:  .  metoprolol tartrate (LOPRESSOR) 100 MG tablet, TAKE 1 TABLET BY MOUTH TWICE A DAY, Disp: 180 tablet, Rfl: 3 .  Multiple Vitamin (MULTIVITAMIN) tablet, Take 1 tablet by mouth daily., Disp: , Rfl:  .  potassium chloride SA (K-DUR) 20 MEQ tablet, Take 1 tablet (20 mEq total) by mouth daily., Disp: 30 tablet, Rfl: 0 .  rivaroxaban (XARELTO) 20 MG TABS tablet, Take 1 tablet (20 mg total) by mouth daily with supper., Disp: 90 tablet, Rfl: 1 .  lisinopril (ZESTRIL) 40 MG tablet, Take 1 tablet (40 mg total) by  mouth daily., Disp: 90 tablet, Rfl: 3 .  tetanus & diphtheria toxoids, adult, (TENIVAC) 5-2 LFU injection, Inject 0.5 mLs into the muscle once for 1 dose., Disp: 0.5 mL, Rfl: 0  EXAM: This was a telehealth telephone visit notes no physical exam was completed.  ASSESSMENT AND PLAN:  Discussed the following assessment and plan:  Atrial fibrillation Asymptomatic.  He will continue Xarelto and metoprolol.  CHF (congestive heart failure) (HCC) Asymptomatic.  He  will continue Lasix.  We will check lab work.  Essential hypertension Well-controlled.  Continue current regimen.  PAD (peripheral artery disease) ([place]) Concern for possible claudication.  We will check ABIs.  Anemia Check CBC.  Hyperlipidemia Continue Lipitor.  Check lipid panel.  Prediabetes Check A1c.    Safety precautions and sick precautions given regarding COVID-19.  Patient will have his tetanus vaccine through the pharmacy.  I discussed the assessment and treatment plan with the patient. The patient was provided an opportunity to ask questions and all were answered. The patient agreed with the plan and demonstrated an understanding of the instructions.   The patient was advised to call back or seek an in-person evaluation if the symptoms worsen or if the condition fails to improve as anticipated.  I provided 11 minutes of non-face-to-face time during this encounter.   [name], MD

## 2019-06-12 NOTE — Assessment & Plan Note (Signed)
Asymptomatic.  He will continue Xarelto and metoprolol.

## 2019-06-12 NOTE — Assessment & Plan Note (Signed)
Well-controlled.  Continue current regimen. 

## 2019-06-12 NOTE — Assessment & Plan Note (Signed)
Concern for possible claudication.  We will check ABIs.

## 2019-06-12 NOTE — Assessment & Plan Note (Signed)
Check A1c. 

## 2019-06-12 NOTE — Assessment & Plan Note (Signed)
-

## 2019-06-13 DIAGNOSIS — Z23 Encounter for immunization: Secondary | ICD-10-CM | POA: Diagnosis not present

## 2019-06-25 ENCOUNTER — Other Ambulatory Visit (INDEPENDENT_AMBULATORY_CARE_PROVIDER_SITE_OTHER): Payer: Medicare Other

## 2019-06-25 ENCOUNTER — Ambulatory Visit: Payer: Medicare Other | Attending: Family Medicine

## 2019-06-25 ENCOUNTER — Other Ambulatory Visit: Payer: Self-pay

## 2019-06-25 DIAGNOSIS — E782 Mixed hyperlipidemia: Secondary | ICD-10-CM | POA: Diagnosis not present

## 2019-06-25 DIAGNOSIS — I10 Essential (primary) hypertension: Secondary | ICD-10-CM | POA: Diagnosis not present

## 2019-06-25 DIAGNOSIS — R7303 Prediabetes: Secondary | ICD-10-CM | POA: Diagnosis not present

## 2019-06-25 DIAGNOSIS — D649 Anemia, unspecified: Secondary | ICD-10-CM | POA: Diagnosis not present

## 2019-06-25 LAB — CBC
HCT: 31.2 % — ABNORMAL LOW (ref 39.0–52.0)
Hemoglobin: 10.5 g/dL — ABNORMAL LOW (ref 13.0–17.0)
MCHC: 33.8 g/dL (ref 30.0–36.0)
MCV: 103 fl — ABNORMAL HIGH (ref 78.0–100.0)
Platelets: 134 10*3/uL — ABNORMAL LOW (ref 150.0–400.0)
RBC: 3.03 Mil/uL — ABNORMAL LOW (ref 4.22–5.81)
RDW: 16.1 % — ABNORMAL HIGH (ref 11.5–15.5)
WBC: 6.9 10*3/uL (ref 4.0–10.5)

## 2019-06-25 LAB — BASIC METABOLIC PANEL
BUN: 33 mg/dL — ABNORMAL HIGH (ref 6–23)
CO2: 28 mEq/L (ref 19–32)
Calcium: 9.6 mg/dL (ref 8.4–10.5)
Chloride: 96 mEq/L (ref 96–112)
Creatinine, Ser: 1.33 mg/dL (ref 0.40–1.50)
GFR: 52.29 mL/min — ABNORMAL LOW (ref >60.00)
Glucose, Bld: 106 mg/dL — ABNORMAL HIGH (ref 70–99)
Potassium: 4.3 mEq/L (ref 3.5–5.1)
Sodium: 131 mEq/L — ABNORMAL LOW (ref 135–145)

## 2019-06-25 LAB — HEMOGLOBIN A1C: Hgb A1c MFr Bld: 6.9 % — ABNORMAL HIGH (ref 4.6–6.5)

## 2019-06-25 LAB — LIPID PANEL
Cholesterol: 94 mg/dL (ref 0–200)
HDL: 44.4 mg/dL (ref >39.00)
LDL Cholesterol: 41 mg/dL (ref 0–99)
NonHDL: 49.73
Total CHOL/HDL Ratio: 2
Triglycerides: 45 mg/dL (ref 0.0–149.0)
VLDL: 9 mg/dL (ref 0.0–40.0)

## 2019-07-03 ENCOUNTER — Telehealth: Payer: Self-pay

## 2019-07-03 NOTE — Telephone Encounter (Signed)
PA approval for Tenivac injectable from 04/03/2019-07/01/2020. Nina,cma

## 2019-07-12 ENCOUNTER — Other Ambulatory Visit: Payer: Self-pay | Admitting: Family Medicine

## 2019-07-12 DIAGNOSIS — E871 Hypo-osmolality and hyponatremia: Secondary | ICD-10-CM

## 2019-07-12 DIAGNOSIS — N1831 Chronic kidney disease, stage 3a: Secondary | ICD-10-CM | POA: Insufficient documentation

## 2019-07-12 DIAGNOSIS — D539 Nutritional anemia, unspecified: Secondary | ICD-10-CM

## 2019-07-12 NOTE — Addendum Note (Signed)
Addended by: De Hollingshead on: 07/12/2019 06:01 PM   Modules accepted: Orders

## 2019-07-12 NOTE — Progress Notes (Signed)
[facility] referral placed. See 06/25/2019 lab notes regarding transportation assistance.

## 2019-07-15 ENCOUNTER — Telehealth: Payer: Self-pay

## 2019-07-15 NOTE — Telephone Encounter (Signed)
Copied from [place] [id number]. Topic: Referral - Status >> Jul 15, 2019  [id number] PM Simone Curia D wrote: [phone] Unable to leave message, voicemail not set-up.  Will call again later this week. Ambrose Mantle [phone]

## 2019-07-16 ENCOUNTER — Telehealth: Payer: Self-pay

## 2019-07-16 NOTE — Telephone Encounter (Signed)
Copied from [place] [phone]. Topic: Referral - Status >> Jul 16, 2019 [id number] PM Simone Curia D wrote: [phone] Spoke with patient about [employer]. Ambrose Mantle [phone]

## 2019-07-28 ENCOUNTER — Telehealth: Payer: Self-pay

## 2019-07-28 ENCOUNTER — Ambulatory Visit: Payer: Medicare Other | Attending: Family Medicine

## 2019-07-28 NOTE — Telephone Encounter (Signed)
Copied from [place] [phone]. Topic: Referral - Status >> Jul 28, 2019  123456 PM Simone Curia D wrote: [phone] Spoke with patient about [employer]. Ambrose Mantle [phone]

## 2019-08-03 ENCOUNTER — Telehealth: Payer: Self-pay

## 2019-08-03 NOTE — Telephone Encounter (Signed)
Copied from [place] [phone]. Topic: Referral - Status >> Aug 03, 2019  0000000 PM Simone Curia D wrote: [id number] Spoke with patient he will call Medicaid transportation when he has an appointment.  I explained that he would need to call a few days in advance of his appointment. Ambrose Mantle [phone]

## 2019-10-26 ENCOUNTER — Other Ambulatory Visit: Payer: Self-pay | Admitting: Cardiovascular Disease

## 2019-10-26 MED ORDER — RIVAROXABAN 20 MG PO TABS: 20.0000 mg | ORAL_TABLET | Freq: Every day | ORAL | 1 refills | Status: DC

## 2019-10-26 NOTE — Telephone Encounter (Signed)
*  STAT* If patient is at the pharmacy, call can be transferred to refill team.   1. Which medications need to be refilled? (please list name of each medication and dose if known) Xarelto 20 MG  2. Which pharmacy/location (including street and city if local pharmacy) is medication to be sent to? [facility] in [place]  3. Do they need a 30 day or 90 day supply? 90 day

## 2019-10-26 NOTE — Telephone Encounter (Signed)
Refill request

## 2019-10-26 NOTE — Telephone Encounter (Signed)
Pt's age 79, wt 87.1 kg, SCr 1.33, CrCl 58.21, last ov w/ TG 01/02/19. Xarelto refill sent in as requested.

## 2019-12-09 ENCOUNTER — Other Ambulatory Visit: Payer: Self-pay

## 2019-12-14 ENCOUNTER — Ambulatory Visit: Payer: Medicare Other | Admitting: Family Medicine

## 2019-12-29 ENCOUNTER — Other Ambulatory Visit: Payer: Self-pay | Admitting: Cardiovascular Disease

## 2019-12-29 NOTE — Telephone Encounter (Signed)
Please schedule overdue 6 month F/U with Dr. Gollan. Thank you! 

## 2020-01-26 ENCOUNTER — Ambulatory Visit: Payer: Medicare Other | Admitting: Cardiovascular Disease

## 2020-01-26 NOTE — Progress Notes (Deleted)
Date:  01/26/2020   ID:  Jordan Perkins, DOB Nov 30, 1940, MRN [id number]  Patient Location:  [place] [address]   Provider location:   [facility], [place] office  PCP:  [name], MD  Cardiologist:  [facility]  No chief complaint on file.    History of Present Illness:    Jordan Perkins is a 79 y.o. male  past medical history of CAD,  bypass surgery, 2005 at [facility] stents in 2005,  hypertension,  hyperlipidemia,  Chronic atrial fibrillation , on xarelto hospital with shortness of breath and chest pain, rapid atrial fibrillation.  Stress test 2014 showed no ischemia Echocardiogram showed ejection fraction 60-65% Carotid 07/2016: 40 to 59% stenosis on left, <39% on the right Who presents for follow up of his CAD and atrial fibrillation  No recent hospital admissions Lives with brother  No chest pain, No SOB, chronic bronchitis active  Mild swelling in legs, Ran out of HCTZ.  Has not had this refilled yet Prior history of running out of his medications  Tolerating anticoagulation, On xarelto 20 mg daily No GI bleeding  Active, no regular exercise program Used to Make banjos ,   plays the banjo  Other past medical history reviewed Hospital admission 10/2016 Hyponatremia, weakness, elevated T.Bili (gilberts)  hospital admission in 2014, noted to have gallstones.   negative Hida scan. Now s/p Cholecystectomy 11/14  CT scan in the hospital of his abdomen and pelvis showed acute pancreatitis with increased fluid density in the peri-pancreatic fat, multiple gallstones, small hiatal hernia  Prior CV studies:   The following studies were reviewed today:  Echocardiogram showed ejection fraction 60-65%, mild to moderate TR, moderately elevated right ventricular systolic pressures  Past Medical History:  Diagnosis Date  . (HFpEF) heart failure with preserved ejection fraction ([place])   . Coronary  artery disease   . Hyperlipidemia   . Hypertension   . PAH (pulmonary artery hypertension) ([place])   . Permanent atrial fibrillation ([place])   . Pleural effusion    Past Surgical History:  Procedure Laterality Date  . CARDIAC CATHETERIZATION    . CHOLECYSTECTOMY    . CORONARY ANGIOPLASTY  06/2004   s/p stent placement @ [facility]  . CORONARY ARTERY BYPASS GRAFT  04-24-2004   CABG x 3 [facility]     No outpatient medications have been marked as taking for the 01/26/20 encounter (Appointment) with [name], MD.     Allergies:   Sulfa antibiotics   Social History   Tobacco Use  . Smoking status: Never Smoker  . Smokeless tobacco: Never Used  Substance Use Topics  . Alcohol use: No  . Drug use: No     Current Outpatient Medications on File Prior to Visit  Medication Sig Dispense Refill  . atorvastatin (LIPITOR) 80 MG tablet TAKE 1 TABLET BY MOUTH EVERY DAY 90 tablet 0  . hydrochlorothiazide (HYDRODIURIL) 25 MG tablet TAKE 1 TABLET BY MOUTH EVERY DAY 90 tablet 0  . lisinopril (ZESTRIL) 40 MG tablet Take 1 tablet (40 mg total) by mouth daily. 90 tablet 3  . metoprolol tartrate (LOPRESSOR) 100 MG tablet TAKE 1 TABLET BY MOUTH TWICE A DAY 180 tablet 0  . Multiple Vitamin (MULTIVITAMIN) tablet Take 1 tablet by mouth daily.    . potassium chloride SA (K-DUR) 20 MEQ tablet Take 1 tablet (20 mEq total) by mouth daily. 30 tablet 0  . rivaroxaban (XARELTO) 20 MG  TABS tablet Take 1 tablet (20 mg total) by mouth daily with supper. 90 tablet 1   No current facility-administered medications on file prior to visit.     Family Hx: The patient's family history includes Heart attack in his brother; Heart disease in his father and paternal uncle.  ROS:   Please see the history of present illness.    Review of Systems  Constitutional: Negative.   Respiratory: Negative.   Cardiovascular: Positive for leg swelling.  Gastrointestinal: Negative.   Musculoskeletal: Negative.   Neurological:  Negative.   Psychiatric/Behavioral: Negative.   All other systems reviewed and are negative.    Labs/Other Tests and Data Reviewed:    Recent Labs: 02/16/2019: ALT 19; B Natriuretic Peptide 532.0 02/17/2019: Magnesium 1.5; TSH 0.648 06/25/2019: BUN 33; Creatinine, Ser 1.33; Hemoglobin 10.5; Platelets 134.0; Potassium 4.3; Sodium 131   Recent Lipid Panel Lab Results  Component Value Date/Time   CHOL 94 06/25/2019 09:27 AM   CHOL 89 (L) 04/06/2015 04:42 PM   CHOL 150 06/06/2013 09:47 AM   TRIG 45.0 06/25/2019 09:27 AM   TRIG 85 06/06/2013 09:47 AM   HDL 44.40 06/25/2019 09:27 AM   HDL 40 04/06/2015 04:42 PM   HDL 43 06/06/2013 09:47 AM   CHOLHDL 2 06/25/2019 09:27 AM   LDLCALC 41 06/25/2019 09:27 AM   LDLCALC 36 04/06/2015 04:42 PM   LDLCALC 90 06/06/2013 09:47 AM    Wt Readings from Last 3 Encounters:  06/12/19 192 lb (87.1 kg)  02/18/19 181 lb 8 oz (82.3 kg)  09/23/17 185 lb 8 oz (84.1 kg)     Exam:    Vital Signs: Vital signs may also be detailed in the HPI There were no vitals taken for this visit.  Wt Readings from Last 3 Encounters:  06/12/19 192 lb (87.1 kg)  02/18/19 181 lb 8 oz (82.3 kg)  09/23/17 185 lb 8 oz (84.1 kg)   Temp Readings from Last 3 Encounters:  02/18/19 97.8 F (36.6 C) (Oral)  11/05/16 98.4 F (36.9 C) (Oral)  11/01/16 97.7 F (36.5 C) (Oral)   BP Readings from Last 3 Encounters:  02/18/19 (!) 149/71  09/23/17 (!) 152/72  01/14/17 140/64   Pulse Readings from Last 3 Encounters:  02/18/19 (!) 52  01/14/17 74  11/05/16 63    125/70 Pulse 50 -60 resp 32  Well nourished, well developed male in no acute distress. Constitutional:  oriented to person, place, and time. No distress.    ASSESSMENT & PLAN:    Atrial fibrillation, unspecified type ([place]) Permanent atrial fibrillation On metoprolol, anticoagulation Variable heart rate 50-60 per the patient Denies any orthostasis symptoms, no changes made to his medications Recommended  if heart rate continues to run persistently low 50s that he call our office Would likely need to cut back on his metoprolol  Coronary artery disease of native artery of native heart with stable angina pectoris ([place]) Currently with no symptoms of angina. No further workup at this time. Continue current medication regimen.  PAD (peripheral artery disease) ([place]) At later date in office follow-up, would recommend repeat carotid ultrasound  Bilateral carotid artery disease, unspecified type ([place]) As above, continue aggressive lipid management, periodic imaging  S/P CABG (coronary artery bypass graft) Feels relatively euvolemic  Essential hypertension Blood pressure is well controlled on today's visit. No changes made to the medications. Medications have been refilled   Disposition: Follow-up in 6 months   Signed, Ida Rogue, MD  01/26/2020 8:14 AM  [place] Office [id number], [place], [place] [address]

## 2020-02-26 ENCOUNTER — Emergency Department: Payer: Medicare HMO

## 2020-02-26 ENCOUNTER — Encounter: Payer: Self-pay | Admitting: Emergency Medicine

## 2020-02-26 ENCOUNTER — Emergency Department
Admission: EM | Admit: 2020-02-26 | Discharge: 2020-02-27 | Disposition: A | Discharge status: Home / Self Care | Payer: Medicare HMO | Attending: Emergency Medicine | Admitting: Emergency Medicine

## 2020-02-26 ENCOUNTER — Other Ambulatory Visit: Payer: Self-pay

## 2020-02-26 DIAGNOSIS — N183 Chronic kidney disease, stage 3 unspecified: Secondary | ICD-10-CM | POA: Insufficient documentation

## 2020-02-26 DIAGNOSIS — W19XXXA Unspecified fall, initial encounter: Secondary | ICD-10-CM | POA: Insufficient documentation

## 2020-02-26 DIAGNOSIS — R531 Weakness: Secondary | ICD-10-CM | POA: Diagnosis not present

## 2020-02-26 DIAGNOSIS — I129 Hypertensive chronic kidney disease with stage 1 through stage 4 chronic kidney disease, or unspecified chronic kidney disease: Secondary | ICD-10-CM | POA: Insufficient documentation

## 2020-02-26 DIAGNOSIS — I509 Heart failure, unspecified: Secondary | ICD-10-CM | POA: Diagnosis not present

## 2020-02-26 DIAGNOSIS — Z951 Presence of aortocoronary bypass graft: Secondary | ICD-10-CM | POA: Insufficient documentation

## 2020-02-26 DIAGNOSIS — M6281 Muscle weakness (generalized): Secondary | ICD-10-CM | POA: Diagnosis not present

## 2020-02-26 DIAGNOSIS — J90 Pleural effusion, not elsewhere classified: Secondary | ICD-10-CM | POA: Diagnosis not present

## 2020-02-26 DIAGNOSIS — Y9301 Activity, walking, marching and hiking: Secondary | ICD-10-CM | POA: Insufficient documentation

## 2020-02-26 DIAGNOSIS — I4821 Permanent atrial fibrillation: Secondary | ICD-10-CM | POA: Insufficient documentation

## 2020-02-26 DIAGNOSIS — J439 Emphysema, unspecified: Secondary | ICD-10-CM | POA: Diagnosis not present

## 2020-02-26 DIAGNOSIS — Y929 Unspecified place or not applicable: Secondary | ICD-10-CM | POA: Diagnosis not present

## 2020-02-26 DIAGNOSIS — I959 Hypotension, unspecified: Secondary | ICD-10-CM | POA: Diagnosis not present

## 2020-02-26 DIAGNOSIS — Z7901 Long term (current) use of anticoagulants: Secondary | ICD-10-CM | POA: Diagnosis not present

## 2020-02-26 DIAGNOSIS — Y999 Unspecified external cause status: Secondary | ICD-10-CM | POA: Insufficient documentation

## 2020-02-26 DIAGNOSIS — I2510 Atherosclerotic heart disease of native coronary artery without angina pectoris: Secondary | ICD-10-CM | POA: Insufficient documentation

## 2020-02-26 DIAGNOSIS — R519 Headache, unspecified: Secondary | ICD-10-CM | POA: Diagnosis not present

## 2020-02-26 DIAGNOSIS — Z79899 Other long term (current) drug therapy: Secondary | ICD-10-CM | POA: Insufficient documentation

## 2020-02-26 DIAGNOSIS — R001 Bradycardia, unspecified: Secondary | ICD-10-CM | POA: Diagnosis not present

## 2020-02-26 DIAGNOSIS — I110 Hypertensive heart disease with heart failure: Secondary | ICD-10-CM | POA: Diagnosis not present

## 2020-02-26 LAB — URINALYSIS, COMPLETE (UACMP) WITH MICROSCOPIC
Bacteria, UA: NONE SEEN
Bilirubin Urine: NEGATIVE
Glucose, UA: NEGATIVE mg/dL
Ketones, ur: NEGATIVE mg/dL
Leukocytes,Ua: NEGATIVE
Nitrite: NEGATIVE
Protein, ur: NEGATIVE mg/dL
Specific Gravity, Urine: 1.014 (ref 1.005–1.030)
pH: 5 (ref 5.0–8.0)

## 2020-02-26 LAB — BASIC METABOLIC PANEL
Anion gap: 12 (ref 5–15)
BUN: 53 mg/dL — ABNORMAL HIGH (ref 8–23)
CO2: 20 mmol/L — ABNORMAL LOW (ref 22–32)
Calcium: 9 mg/dL (ref 8.9–10.3)
Chloride: 97 mmol/L — ABNORMAL LOW (ref 98–111)
Creatinine, Ser: 1.39 mg/dL — ABNORMAL HIGH (ref 0.61–1.24)
GFR calc Af Amer: 57 mL/min — ABNORMAL LOW (ref >60)
GFR calc non Af Amer: 49 mL/min — ABNORMAL LOW (ref >60)
Glucose, Bld: 117 mg/dL — ABNORMAL HIGH (ref 70–99)
Potassium: 4.5 mmol/L (ref 3.5–5.1)
Sodium: 129 mmol/L — ABNORMAL LOW (ref 135–145)

## 2020-02-26 LAB — CBC
HCT: 32 % — ABNORMAL LOW (ref 39.0–52.0)
Hemoglobin: 11.3 g/dL — ABNORMAL LOW (ref 13.0–17.0)
MCH: 34.8 pg — ABNORMAL HIGH (ref 26.0–34.0)
MCHC: 35.3 g/dL (ref 30.0–36.0)
MCV: 98.5 fL (ref 80.0–100.0)
Platelets: 160 10*3/uL (ref 150–400)
RBC: 3.25 MIL/uL — ABNORMAL LOW (ref 4.22–5.81)
RDW: 14.4 % (ref 11.5–15.5)
WBC: 7.9 10*3/uL (ref 4.0–10.5)
nRBC: 0 % (ref 0.0–0.2)

## 2020-02-26 LAB — HEPATIC FUNCTION PANEL
ALT: 22 U/L (ref 0–44)
AST: 37 U/L (ref 15–41)
Albumin: 3.5 g/dL (ref 3.5–5.0)
Alkaline Phosphatase: 86 U/L (ref 38–126)
Bilirubin, Direct: 0.6 mg/dL — ABNORMAL HIGH (ref 0.0–0.2)
Indirect Bilirubin: 2.4 mg/dL — ABNORMAL HIGH (ref 0.3–0.9)
Total Bilirubin: 3 mg/dL — ABNORMAL HIGH (ref 0.3–1.2)
Total Protein: 8.1 g/dL (ref 6.5–8.1)

## 2020-02-26 LAB — BRAIN NATRIURETIC PEPTIDE: B Natriuretic Peptide: 492.2 pg/mL — ABNORMAL HIGH (ref 0.0–100.0)

## 2020-02-26 MED ORDER — SODIUM CHLORIDE 0.9% FLUSH: 3.0000 mL | Freq: Once | INTRAVENOUS | Status: DC

## 2020-02-26 MED ORDER — FUROSEMIDE 20 MG PO TABS
20.0000 mg | ORAL_TABLET | Freq: Every day | ORAL | 0 refills | Status: DC
Start: 2020-02-26 — End: 2020-03-23

## 2020-02-26 NOTE — ED Notes (Signed)
Patient ambulated with walker to bathroom. Patient ambulated with steady gait.

## 2020-02-26 NOTE — Discharge Instructions (Addendum)
You had a little bit of fluid in your lungs and on your leg so we can trial you on a low dose of Lasix to see if that will help you get some of the fluid off.  You can follow-up with the heart failure clinic next week.  You need to have your labs rechecked.  There is no sign of head bleed or stroke upon my examination.  Return to the ER if you develop any other new concerns.

## 2020-02-26 NOTE — ED Provider Notes (Signed)
[facility] Emergency Department Provider Note  ____________________________________________   First MD Initiated Contact with Patient 02/26/20 2145     (approximate)  I have reviewed the triage vital signs and the nursing notes.   HISTORY  Chief Complaint Weakness    HPI Jordan Perkins is a 79 y.o. male with heart failure, coronary disease, A. fib who comes in with generalized weakness.  Patient states that he is chronic swelling to his bilateral legs.  He states that he got up today and his legs felt weak and he fell down to the ground.  States he did not hit his head.  Did not lose consciousness.  He did not have any cervical pain.  He states that he is just worried he could have had a stroke due to his legs giving out but denies any weakness to 1 side.  Patient is on Xarelto and has been compliant with it.  Patient lives with his brother ambulates with a cane.  Patient denies any chest pain, shortness of breath.          Past Medical History:  Diagnosis Date  . (HFpEF) heart failure with preserved ejection fraction ([place])   . Coronary artery disease   . Hyperlipidemia   . Hypertension   . PAH (pulmonary artery hypertension) ([place])   . Permanent atrial fibrillation ([place])   . Pleural effusion     Patient Active Problem List   Diagnosis Date Noted  . Stage 3a chronic kidney disease 07/12/2019  . Prediabetes 06/12/2019  . Anemia 03/10/2019  . Pulmonary hypertension, unspecified ([place])   . Pleural effusion   . CHF (congestive heart failure) ([place]) 02/16/2019  . PAD (peripheral artery disease) ([place]) 01/02/2019  . Bilateral carotid artery disease ([place]) 01/02/2019  . Memory difficulty 11/05/2016  . Seborrheic keratoses 11/05/2016  . Hyponatremia 10/29/2016  . Encounter for anticoagulation discussion and counseling 07/21/2014  . CAD (coronary artery disease) 06/23/2013  . 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  . Atrial  fibrillation ([place]) 06/23/2013  . Hyperlipidemia 06/23/2013  . Essential hypertension 06/23/2013  . Gallstone pancreatitis 06/23/2013    Past Surgical History:  Procedure Laterality Date  . CARDIAC CATHETERIZATION    . CHOLECYSTECTOMY    . CORONARY ANGIOPLASTY  06/2004   s/p stent placement @ [facility]  . CORONARY ARTERY BYPASS GRAFT  04-24-2004   CABG x 3 [facility]    Prior to Admission medications   Medication Sig Start Date End Date Taking? Authorizing Provider  atorvastatin (LIPITOR) 80 MG tablet TAKE 1 TABLET BY MOUTH EVERY DAY 12/29/19   [name], MD  hydrochlorothiazide (HYDRODIURIL) 25 MG tablet TAKE 1 TABLET BY MOUTH EVERY DAY 12/29/19   [name], MD  lisinopril (ZESTRIL) 40 MG tablet Take 1 tablet (40 mg total) by mouth daily. 01/02/19 04/02/19  [name], MD  metoprolol tartrate (LOPRESSOR) 100 MG tablet TAKE 1 TABLET BY MOUTH TWICE A DAY 12/29/19   [name], MD  Multiple Vitamin (MULTIVITAMIN) tablet Take 1 tablet by mouth daily.    [provider]  potassium chloride SA (K-DUR) 20 MEQ tablet Take 1 tablet (20 mEq total) by mouth daily. 02/19/19   Fritzi Mandes, MD  rivaroxaban (XARELTO) 20 MG TABS tablet Take 1 tablet (20 mg total) by mouth daily with supper. 10/26/19   [name], MD    Allergies Sulfa antibiotics  Family History  Problem Relation Age of Onset  . Heart disease Father   .  Heart disease Paternal Uncle   . Heart attack Brother     Social History Social History   Tobacco Use  . Smoking status: Never Smoker  . Smokeless tobacco: Never Used  Substance Use Topics  . Alcohol use: No  . Drug use: No      Review of Systems Constitutional: No fever/chills, leg weakness, fall Eyes: No visual changes. ENT: No sore throat. Cardiovascular: Denies chest pain. Respiratory: Denies shortness of breath. Gastrointestinal: No abdominal pain.  No nausea, no vomiting.  No diarrhea.  No constipation. Genitourinary: Negative  for dysuria. Musculoskeletal: Negative for back pain.  Leg swelling baseline Skin: Negative for rash. Neurological: Negative for headaches, focal weakness or numbness. All other ROS negative ____________________________________________   PHYSICAL EXAM:  VITAL SIGNS: ED Triage Vitals [02/26/20 1753]  Enc Vitals Group     BP (!) 146/59     Pulse Rate 60     Resp (!) 22     Temp 98.2 F (36.8 C)     Temp Source Oral     SpO2 100 %     Weight 192 lb (87.1 kg)     Height 5\' 11"  (1.803 m)     Head Circumference      Peak Flow      Pain Score 0     Pain Loc      Pain Edu?      Excl. in [place]?     Constitutional: Alert and oriented. Well appearing and in no acute distress. Eyes: Conjunctivae are normal. EOMI. Head: Atraumatic. Nose: No congestion/rhinnorhea. Mouth/Throat: Mucous membranes are moist.   Neck: No stridor. Trachea Midline. FROM Cardiovascular: Normal rate, regular rhythm. Grossly normal heart sounds.  Good peripheral circulation. Respiratory: Normal respiratory effort.  No retractions. Lungs CTAB. Gastrointestinal: Soft and nontender. No distention. No abdominal bruits.  Musculoskeletal: No lower extremity tenderness nor edema.  No joint effusions.  Chronic venous stasis changes to the bilateral legs with 2+ distal pulses. Neurologic:  Normal speech and language. No gross focal neurologic deficits are appreciated.  Equal strength in arms and legs.  Cranial nerves II through XII appear intact Skin:  Skin is warm, dry and intact. No rash noted. Psychiatric: Mood and affect are normal. Speech and behavior are normal. GU: Deferred   ____________________________________________   LABS (all labs ordered are listed, but only abnormal results are displayed)  Labs Reviewed  BASIC METABOLIC PANEL - Abnormal; Notable for the following components:      Result Value   Sodium 129 (*)    Chloride 97 (*)    CO2 20 (*)    Glucose, Bld 117 (*)    BUN 53 (*)    Creatinine,  Ser 1.39 (*)    GFR calc non Af Amer 49 (*)    GFR calc Af Amer 57 (*)    All other components within normal limits  URINALYSIS, COMPLETE (UACMP) WITH MICROSCOPIC - Abnormal; Notable for the following components:   Color, Urine YELLOW (*)    APPearance CLEAR (*)    Hgb urine dipstick SMALL (*)    All other components within normal limits  BRAIN NATRIURETIC PEPTIDE - Abnormal; Notable for the following components:   B Natriuretic Peptide 492.2 (*)    All other components within normal limits  HEPATIC FUNCTION PANEL - Abnormal; Notable for the following components:   Total Bilirubin 3.0 (*)    Bilirubin, Direct 0.6 (*)    Indirect Bilirubin 2.4 (*)    All other  components within normal limits  CBC - Abnormal; Notable for the following components:   RBC 3.25 (*)    Hemoglobin 11.3 (*)    HCT 32.0 (*)    MCH 34.8 (*)    All other components within normal limits   ____________________________________________   ED ECG REPORT I, Vanessa Natural Steps, the attending physician, personally viewed and interpreted this ECG.  A. fib rate of 47, no ST elevation, no T inversions, normal intervals ____________________________________________  RADIOLOGY Robert Bellow, personally viewed and evaluated these images (plain radiographs) as part of my medical decision making, as well as reviewing the written report by the radiologist.  ED MD interpretation: Bilateral pleural effusions with mild interstitial edema  Official radiology report(s): DG Chest 2 View  Result Date: 02/26/2020 CLINICAL DATA:  Leg swelling, generalized weakness EXAM: CHEST - 2 VIEW COMPARISON:  Radiograph 02/17/2019 FINDINGS: Postsurgical changes related to prior CABG including intact and aligned sternotomy wires and multiple surgical clips projecting over the mediastinum. Stable cardiomediastinal contours with a calcified aorta. There are bilateral pleural effusions. Some mild central vascular congestion is present as well. Few  peripheral septal lines. Suspect a background of hyperinflation and emphysema. No focal consolidative opacity or pneumothorax. No acute osseous or soft tissue abnormality. Degenerative changes are present in the imaged spine and shoulders. IMPRESSION: Bilateral pleural effusions and mild central vascular congestion. Few septal lines could suggest mild interstitial edema Prior CABG. Aortic Atherosclerosis (ICD10-I70.0). Electronically Signed   By: Lovena Le M.D.   On: 02/26/2020 19:10   CT Head Wo Contrast  Result Date: 02/26/2020 CLINICAL DATA:  Headache weakness EXAM: CT HEAD WITHOUT CONTRAST TECHNIQUE: Contiguous axial images were obtained from the base of the skull through the vertex without intravenous contrast. COMPARISON:  None. FINDINGS: Brain: No acute territorial infarction, hemorrhage or intracranial mass. Mild atrophy. Mild hypodensity in the white matter consistent with chronic small vessel ischemic change. Chronic appearing lacunar infarcts within the bilateral basal ganglia. Nonenlarged ventricles. Vascular: No hyperdense vessels.  Scattered carotid calcification Skull: Normal. Negative for fracture or focal lesion. Sinuses/Orbits: Old appearing deformity medial wall right orbit. Other: None IMPRESSION: 1. No CT evidence for acute intracranial abnormality. 2. Atrophy and mild chronic small vessel ischemic change of the white matter Electronically Signed   By: Donavan Foil M.D.   On: 02/26/2020 22:51    ____________________________________________   PROCEDURES  Procedure(s) performed (including Critical Care):  Procedures   ____________________________________________   INITIAL IMPRESSION / ASSESSMENT AND PLAN / ED COURSE  Jordan Perkins was evaluated in Emergency Department on 02/26/2020 for the symptoms described in the history of present illness. He was evaluated in the context of the global COVID-19 pandemic, which necessitated consideration that the patient might be at  risk for infection with the SARS-CoV-2 virus that causes COVID-19. Institutional protocols and algorithms that pertain to the evaluation of patients at risk for COVID-19 are in a state of rapid change based on information released by regulatory bodies including the CDC and federal and state organizations. These policies and algorithms were followed during the patient's care in the ED.    Patient is a 79 year old who comes in with concerns for his legs giving out.  Patient does not seem to have any focal neurological deficits.  Denies any chest pain or shortness of breath.  Will get CT head to evaluate for intracranial hemorrhage given patient's on a blood thinner although he does not state he hit his head.  Denies any cervical  spine tenderness and cleared by [facility].  Low suspicion for stroke given reassuring neuro exam.  Patient does have swelling to his bilateral legs but he states that this is at baseline for him, may be slightly worse.  He is on Xarelto so do not think its a DVT and its equal on both legs.  It looks like chronic venous stasis.  He has no fever or warmth to the legs to suggest cellulitis.  Labs are ordered to evaluate Electra abnormalities, AKI.  Patient denies any loss of consciousness to suggest syncope.  Labs show his hemoglobin is stable BNP is similar to prior Bilirubin is slightly elevated but similar to 1 year ago up from 2.5-3 Sodium slightly low at 129 most likely secondary to fluid overload suggest making a little diuresis.  And his creatinine is 1.39 slightly elevated  Patient does not feel short of breath although chest x-ray does show some very tiny pleural effusions and tiny pulmonary edema.  He is not hypoxic.  Patient is able to ambulate with a walker.  Given his exam I think it be reasonable to start a low-dose of Lasix for a few days and see if that helps with the swelling of his legs and have him follow-up with his primary care doctor in a few days.  Also put a referral  in for heart failure clinic.  Discussed with patient he is comfortable with this plan.  I discussed the provisional nature of ED diagnosis, the treatment so far, the ongoing plan of care, follow up appointments and return precautions with the patient and any family or support people present. They expressed understanding and agreed with the plan, discharged home.      ____________________________________________   FINAL CLINICAL IMPRESSION(S) / ED DIAGNOSES   Final diagnoses:  Congestive heart failure, unspecified HF chronicity, unspecified heart failure type ([place])  Fall, initial encounter      MEDICATIONS GIVEN DURING THIS VISIT:  Medications  sodium chloride flush (NS) 0.9 % injection 3 mL (3 mLs Intravenous Not Given 02/26/20 2140)     ED Discharge Orders         Ordered    AMB referral to CHF clinic     Discontinue  Reprint     02/26/20 2350    furosemide (LASIX) 20 MG tablet  Daily     Discontinue  Reprint     02/26/20 2350           Note:  This document was prepared using Dragon voice recognition software and may include unintentional dictation errors.   Vanessa Campbell, MD 02/26/20 2351

## 2020-02-26 NOTE — ED Notes (Signed)
Patient c/o bilateral lower extremity edema for several weeks. Patient denies SOB. Lung sounds clear all fields. Patient c/o weakness.

## 2020-02-26 NOTE — ED Triage Notes (Signed)
Pt presents to ED via [facility] with c/o generalized weakness. Pt states was walking today and his legs gave out and he fell. Pt states bilateral lower extremity swelling and pain with ambulation. Pt with noted abdominal swelling and jaundice noted in triage.

## 2020-02-27 NOTE — ED Notes (Signed)
Attempted to call pt's brother, [name], with permission for discharge transport.  No answer from brother and unable to leave voicemail.

## 2020-02-27 NOTE — ED Notes (Signed)
Discharge signature pad not available and unable to print hard copy for patient to sign.  Pt understands discharge paperwork and has no further questions at this time.

## 2020-02-29 ENCOUNTER — Telehealth: Payer: Self-pay | Admitting: Family Medicine

## 2020-02-29 NOTE — Telephone Encounter (Signed)
Called the patient to schedule a ED f/up and he has a VM that has not been set up.  Lorree Millar,cma

## 2020-02-29 NOTE — Telephone Encounter (Signed)
Patient was seen in the ED over the weekend. He needs follow-up in this office this week. Please contact him and offer him follow-up this week with me. Thanks.

## 2020-03-07 DIAGNOSIS — M79605 Pain in left leg: Secondary | ICD-10-CM | POA: Diagnosis not present

## 2020-03-07 DIAGNOSIS — M79604 Pain in right leg: Secondary | ICD-10-CM | POA: Diagnosis not present

## 2020-03-07 DIAGNOSIS — R0600 Dyspnea, unspecified: Secondary | ICD-10-CM | POA: Diagnosis not present

## 2020-03-07 DIAGNOSIS — E119 Type 2 diabetes mellitus without complications: Secondary | ICD-10-CM | POA: Diagnosis not present

## 2020-03-07 DIAGNOSIS — M7989 Other specified soft tissue disorders: Secondary | ICD-10-CM | POA: Diagnosis not present

## 2020-03-07 DIAGNOSIS — I509 Heart failure, unspecified: Secondary | ICD-10-CM | POA: Diagnosis not present

## 2020-03-07 DIAGNOSIS — R001 Bradycardia, unspecified: Secondary | ICD-10-CM | POA: Diagnosis not present

## 2020-03-07 DIAGNOSIS — R609 Edema, unspecified: Secondary | ICD-10-CM | POA: Diagnosis not present

## 2020-03-07 DIAGNOSIS — I272 Pulmonary hypertension, unspecified: Secondary | ICD-10-CM | POA: Diagnosis not present

## 2020-03-07 DIAGNOSIS — J90 Pleural effusion, not elsewhere classified: Secondary | ICD-10-CM | POA: Diagnosis not present

## 2020-03-07 DIAGNOSIS — I4891 Unspecified atrial fibrillation: Secondary | ICD-10-CM | POA: Diagnosis not present

## 2020-03-07 DIAGNOSIS — R600 Localized edema: Secondary | ICD-10-CM | POA: Diagnosis not present

## 2020-03-08 DIAGNOSIS — E119 Type 2 diabetes mellitus without complications: Secondary | ICD-10-CM | POA: Diagnosis not present

## 2020-03-08 DIAGNOSIS — I4891 Unspecified atrial fibrillation: Secondary | ICD-10-CM | POA: Diagnosis not present

## 2020-03-08 DIAGNOSIS — E663 Overweight: Secondary | ICD-10-CM | POA: Diagnosis not present

## 2020-03-08 DIAGNOSIS — I4819 Other persistent atrial fibrillation: Secondary | ICD-10-CM | POA: Diagnosis not present

## 2020-03-08 DIAGNOSIS — Z79899 Other long term (current) drug therapy: Secondary | ICD-10-CM | POA: Diagnosis not present

## 2020-03-08 DIAGNOSIS — I10 Essential (primary) hypertension: Secondary | ICD-10-CM | POA: Diagnosis not present

## 2020-03-08 DIAGNOSIS — Z6826 Body mass index (BMI) 26.0-26.9, adult: Secondary | ICD-10-CM | POA: Diagnosis not present

## 2020-03-08 DIAGNOSIS — Z882 Allergy status to sulfonamides status: Secondary | ICD-10-CM | POA: Diagnosis not present

## 2020-03-08 DIAGNOSIS — R001 Bradycardia, unspecified: Secondary | ICD-10-CM | POA: Diagnosis not present

## 2020-03-08 DIAGNOSIS — Z7901 Long term (current) use of anticoagulants: Secondary | ICD-10-CM | POA: Diagnosis not present

## 2020-03-10 NOTE — Telephone Encounter (Signed)
I called the patient to schedule a follow up and he stated he would call back and schedule.  Samantha Olivera,cma

## 2020-03-10 NOTE — Telephone Encounter (Signed)
Noted  

## 2020-03-23 ENCOUNTER — Inpatient Hospital Stay
Admission: EM | Admit: 2020-03-23 | Discharge: 2020-04-06 | DRG: 291 | Disposition: A | Discharge status: Home Health | Payer: Medicare HMO | Attending: Internal Medicine | Admitting: Internal Medicine

## 2020-03-23 ENCOUNTER — Encounter: Payer: Self-pay | Admitting: Emergency Medicine

## 2020-03-23 ENCOUNTER — Other Ambulatory Visit: Payer: Self-pay

## 2020-03-23 ENCOUNTER — Inpatient Hospital Stay (HOSPITAL_COMMUNITY)
Admit: 2020-03-23 | Discharge: 2020-03-23 | Disposition: A | Discharge status: Home / Self Care | Payer: Medicare HMO | Attending: Internal Medicine | Admitting: Internal Medicine

## 2020-03-23 ENCOUNTER — Emergency Department: Payer: Medicare HMO

## 2020-03-23 DIAGNOSIS — I5031 Acute diastolic (congestive) heart failure: Secondary | ICD-10-CM

## 2020-03-23 DIAGNOSIS — G9389 Other specified disorders of brain: Secondary | ICD-10-CM | POA: Diagnosis not present

## 2020-03-23 DIAGNOSIS — R7989 Other specified abnormal findings of blood chemistry: Secondary | ICD-10-CM | POA: Diagnosis present

## 2020-03-23 DIAGNOSIS — I2510 Atherosclerotic heart disease of native coronary artery without angina pectoris: Secondary | ICD-10-CM | POA: Diagnosis present

## 2020-03-23 DIAGNOSIS — Z79899 Other long term (current) drug therapy: Secondary | ICD-10-CM | POA: Diagnosis not present

## 2020-03-23 DIAGNOSIS — I700 Atherosclerosis of aorta: Secondary | ICD-10-CM | POA: Diagnosis not present

## 2020-03-23 DIAGNOSIS — Z20822 Contact with and (suspected) exposure to covid-19: Secondary | ICD-10-CM | POA: Diagnosis not present

## 2020-03-23 DIAGNOSIS — Z9049 Acquired absence of other specified parts of digestive tract: Secondary | ICD-10-CM | POA: Diagnosis not present

## 2020-03-23 DIAGNOSIS — I2721 Secondary pulmonary arterial hypertension: Secondary | ICD-10-CM | POA: Diagnosis present

## 2020-03-23 DIAGNOSIS — K7689 Other specified diseases of liver: Secondary | ICD-10-CM | POA: Diagnosis not present

## 2020-03-23 DIAGNOSIS — R4182 Altered mental status, unspecified: Secondary | ICD-10-CM | POA: Diagnosis not present

## 2020-03-23 DIAGNOSIS — W19XXXA Unspecified fall, initial encounter: Secondary | ICD-10-CM | POA: Diagnosis present

## 2020-03-23 DIAGNOSIS — R531 Weakness: Secondary | ICD-10-CM | POA: Diagnosis not present

## 2020-03-23 DIAGNOSIS — E876 Hypokalemia: Secondary | ICD-10-CM | POA: Diagnosis present

## 2020-03-23 DIAGNOSIS — I130 Hypertensive heart and chronic kidney disease with heart failure and stage 1 through stage 4 chronic kidney disease, or unspecified chronic kidney disease: Principal | ICD-10-CM | POA: Diagnosis present

## 2020-03-23 DIAGNOSIS — D696 Thrombocytopenia, unspecified: Secondary | ICD-10-CM | POA: Diagnosis present

## 2020-03-23 DIAGNOSIS — N1831 Chronic kidney disease, stage 3a: Secondary | ICD-10-CM | POA: Diagnosis not present

## 2020-03-23 DIAGNOSIS — R778 Other specified abnormalities of plasma proteins: Secondary | ICD-10-CM | POA: Diagnosis present

## 2020-03-23 DIAGNOSIS — I7090 Unspecified atherosclerosis: Secondary | ICD-10-CM | POA: Diagnosis not present

## 2020-03-23 DIAGNOSIS — Z9861 Coronary angioplasty status: Secondary | ICD-10-CM | POA: Diagnosis not present

## 2020-03-23 DIAGNOSIS — Z951 Presence of aortocoronary bypass graft: Secondary | ICD-10-CM | POA: Diagnosis not present

## 2020-03-23 DIAGNOSIS — R001 Bradycardia, unspecified: Secondary | ICD-10-CM | POA: Insufficient documentation

## 2020-03-23 DIAGNOSIS — E875 Hyperkalemia: Secondary | ICD-10-CM | POA: Diagnosis not present

## 2020-03-23 DIAGNOSIS — N179 Acute kidney failure, unspecified: Secondary | ICD-10-CM | POA: Diagnosis not present

## 2020-03-23 DIAGNOSIS — R296 Repeated falls: Secondary | ICD-10-CM | POA: Diagnosis present

## 2020-03-23 DIAGNOSIS — I6782 Cerebral ischemia: Secondary | ICD-10-CM | POA: Diagnosis not present

## 2020-03-23 DIAGNOSIS — D649 Anemia, unspecified: Secondary | ICD-10-CM

## 2020-03-23 DIAGNOSIS — K661 Hemoperitoneum: Secondary | ICD-10-CM | POA: Diagnosis not present

## 2020-03-23 DIAGNOSIS — J811 Chronic pulmonary edema: Secondary | ICD-10-CM | POA: Diagnosis not present

## 2020-03-23 DIAGNOSIS — D539 Nutritional anemia, unspecified: Secondary | ICD-10-CM | POA: Diagnosis not present

## 2020-03-23 DIAGNOSIS — H0589 Other disorders of orbit: Secondary | ICD-10-CM | POA: Diagnosis not present

## 2020-03-23 DIAGNOSIS — J90 Pleural effusion, not elsewhere classified: Secondary | ICD-10-CM | POA: Diagnosis not present

## 2020-03-23 DIAGNOSIS — R601 Generalized edema: Secondary | ICD-10-CM | POA: Diagnosis not present

## 2020-03-23 DIAGNOSIS — G9341 Metabolic encephalopathy: Secondary | ICD-10-CM | POA: Diagnosis present

## 2020-03-23 DIAGNOSIS — S301XXA Contusion of abdominal wall, initial encounter: Secondary | ICD-10-CM | POA: Diagnosis not present

## 2020-03-23 DIAGNOSIS — E785 Hyperlipidemia, unspecified: Secondary | ICD-10-CM | POA: Diagnosis not present

## 2020-03-23 DIAGNOSIS — I5033 Acute on chronic diastolic (congestive) heart failure: Secondary | ICD-10-CM | POA: Diagnosis present

## 2020-03-23 DIAGNOSIS — Z7901 Long term (current) use of anticoagulants: Secondary | ICD-10-CM

## 2020-03-23 DIAGNOSIS — I5032 Chronic diastolic (congestive) heart failure: Secondary | ICD-10-CM | POA: Diagnosis present

## 2020-03-23 DIAGNOSIS — I4891 Unspecified atrial fibrillation: Secondary | ICD-10-CM | POA: Diagnosis not present

## 2020-03-23 DIAGNOSIS — Z8249 Family history of ischemic heart disease and other diseases of the circulatory system: Secondary | ICD-10-CM

## 2020-03-23 DIAGNOSIS — I10 Essential (primary) hypertension: Secondary | ICD-10-CM | POA: Diagnosis not present

## 2020-03-23 DIAGNOSIS — I480 Paroxysmal atrial fibrillation: Secondary | ICD-10-CM | POA: Diagnosis present

## 2020-03-23 DIAGNOSIS — J9811 Atelectasis: Secondary | ICD-10-CM | POA: Diagnosis not present

## 2020-03-23 DIAGNOSIS — R0902 Hypoxemia: Secondary | ICD-10-CM | POA: Diagnosis not present

## 2020-03-23 LAB — HEPATIC FUNCTION PANEL
ALT: 17 U/L (ref 0–44)
AST: 30 U/L (ref 15–41)
Albumin: 3.2 g/dL — ABNORMAL LOW (ref 3.5–5.0)
Alkaline Phosphatase: 66 U/L (ref 38–126)
Bilirubin, Direct: 0.7 mg/dL — ABNORMAL HIGH (ref 0.0–0.2)
Indirect Bilirubin: 1.7 mg/dL — ABNORMAL HIGH (ref 0.3–0.9)
Total Bilirubin: 2.4 mg/dL — ABNORMAL HIGH (ref 0.3–1.2)
Total Protein: 8 g/dL (ref 6.5–8.1)

## 2020-03-23 LAB — CBC
HCT: 26.6 % — ABNORMAL LOW (ref 39.0–52.0)
HCT: 26.9 % — ABNORMAL LOW (ref 39.0–52.0)
HCT: 30.4 % — ABNORMAL LOW (ref 39.0–52.0)
HCT: 26.8 % — ABNORMAL LOW (ref 39.0–52.0)
Hemoglobin: 9.4 g/dL — ABNORMAL LOW (ref 13.0–17.0)
Hemoglobin: 9.4 g/dL — ABNORMAL LOW (ref 13.0–17.0)
Hemoglobin: 10.3 g/dL — ABNORMAL LOW (ref 13.0–17.0)
Hemoglobin: 9.6 g/dL — ABNORMAL LOW (ref 13.0–17.0)
MCH: 34.8 pg — ABNORMAL HIGH (ref 26.0–34.0)
MCH: 34.4 pg — ABNORMAL HIGH (ref 26.0–34.0)
MCH: 34 pg (ref 26.0–34.0)
MCH: 34.8 pg — ABNORMAL HIGH (ref 26.0–34.0)
MCHC: 35.3 g/dL (ref 30.0–36.0)
MCHC: 34.9 g/dL (ref 30.0–36.0)
MCHC: 33.9 g/dL (ref 30.0–36.0)
MCHC: 35.8 g/dL (ref 30.0–36.0)
MCV: 98.5 fL (ref 80.0–100.0)
MCV: 98.5 fL (ref 80.0–100.0)
MCV: 100.3 fL — ABNORMAL HIGH (ref 80.0–100.0)
MCV: 97.1 fL (ref 80.0–100.0)
Platelets: 155 10*3/uL (ref 150–400)
Platelets: 172 10*3/uL (ref 150–400)
Platelets: 169 10*3/uL (ref 150–400)
Platelets: 173 10*3/uL (ref 150–400)
RBC: 2.7 MIL/uL — ABNORMAL LOW (ref 4.22–5.81)
RBC: 2.73 MIL/uL — ABNORMAL LOW (ref 4.22–5.81)
RBC: 3.03 MIL/uL — ABNORMAL LOW (ref 4.22–5.81)
RBC: 2.76 MIL/uL — ABNORMAL LOW (ref 4.22–5.81)
RDW: 14.6 % (ref 11.5–15.5)
RDW: 14.6 % (ref 11.5–15.5)
RDW: 14.7 % (ref 11.5–15.5)
RDW: 15.1 % (ref 11.5–15.5)
WBC: 6.6 10*3/uL (ref 4.0–10.5)
WBC: 7 10*3/uL (ref 4.0–10.5)
WBC: 7.9 10*3/uL (ref 4.0–10.5)
WBC: 9.3 10*3/uL (ref 4.0–10.5)
nRBC: 0 % (ref 0.0–0.2)
nRBC: 0 % (ref 0.0–0.2)
nRBC: 0 % (ref 0.0–0.2)
nRBC: 0 % (ref 0.0–0.2)

## 2020-03-23 LAB — URINALYSIS, COMPLETE (UACMP) WITH MICROSCOPIC
Bacteria, UA: NONE SEEN
Bilirubin Urine: NEGATIVE
Glucose, UA: NEGATIVE mg/dL
Hgb urine dipstick: NEGATIVE
Ketones, ur: NEGATIVE mg/dL
Leukocytes,Ua: NEGATIVE
Nitrite: NEGATIVE
Protein, ur: NEGATIVE mg/dL
Specific Gravity, Urine: 1.011 (ref 1.005–1.030)
Squamous Epithelial / HPF: NONE SEEN (ref 0–5)
pH: 7 (ref 5.0–8.0)

## 2020-03-23 LAB — PROTIME-INR
INR: 2.3 — ABNORMAL HIGH (ref 0.8–1.2)
Prothrombin Time: 24.1 seconds — ABNORMAL HIGH (ref 11.4–15.2)

## 2020-03-23 LAB — TROPONIN I (HIGH SENSITIVITY)
Troponin I (High Sensitivity): 22 ng/L — ABNORMAL HIGH (ref <18)
Troponin I (High Sensitivity): 25 ng/L — ABNORMAL HIGH (ref <18)
Troponin I (High Sensitivity): 28 ng/L — ABNORMAL HIGH (ref <18)
Troponin I (High Sensitivity): 28 ng/L — ABNORMAL HIGH (ref <18)

## 2020-03-23 LAB — TYPE AND SCREEN
ABO/RH(D): B POS
Antibody Screen: NEGATIVE

## 2020-03-23 LAB — BASIC METABOLIC PANEL
Anion gap: 12 (ref 5–15)
BUN: 44 mg/dL — ABNORMAL HIGH (ref 8–23)
CO2: 28 mmol/L (ref 22–32)
Calcium: 9 mg/dL (ref 8.9–10.3)
Chloride: 95 mmol/L — ABNORMAL LOW (ref 98–111)
Creatinine, Ser: 1.4 mg/dL — ABNORMAL HIGH (ref 0.61–1.24)
GFR calc Af Amer: 56 mL/min — ABNORMAL LOW (ref >60)
GFR calc non Af Amer: 48 mL/min — ABNORMAL LOW (ref >60)
Glucose, Bld: 141 mg/dL — ABNORMAL HIGH (ref 70–99)
Potassium: 3.3 mmol/L — ABNORMAL LOW (ref 3.5–5.1)
Sodium: 135 mmol/L (ref 135–145)

## 2020-03-23 LAB — GLUCOSE, CAPILLARY: Glucose-Capillary: 127 mg/dL — ABNORMAL HIGH (ref 70–99)

## 2020-03-23 LAB — BRAIN NATRIURETIC PEPTIDE: B Natriuretic Peptide: 574.7 pg/mL — ABNORMAL HIGH (ref 0.0–100.0)

## 2020-03-23 LAB — APTT: aPTT: 42 seconds — ABNORMAL HIGH (ref 24–36)

## 2020-03-23 LAB — SARS CORONAVIRUS 2 BY RT PCR (HOSPITAL ORDER, PERFORMED IN ~~LOC~~ HOSPITAL LAB): SARS Coronavirus 2: NEGATIVE

## 2020-03-23 MED ORDER — PANTOPRAZOLE SODIUM 40 MG IV SOLR
40.0000 mg | Freq: Two times a day (BID) | INTRAVENOUS | Status: DC
  Administered 2020-03-23 – 2020-03-25 (×4): 40 mg via INTRAVENOUS
  Filled 2020-03-23 (×4): qty 40

## 2020-03-23 MED ORDER — PANTOPRAZOLE SODIUM 40 MG IV SOLR
40.0000 mg | Freq: Once | INTRAVENOUS | Status: AC
  Administered 2020-03-23: 40 mg via INTRAVENOUS
  Filled 2020-03-23: qty 40

## 2020-03-23 MED ORDER — SODIUM CHLORIDE 0.9 % IV SOLN: 250.0000 mL | INTRAVENOUS | Status: DC | PRN

## 2020-03-23 MED ORDER — CHLORHEXIDINE GLUCONATE CLOTH 2 % EX PADS
6.0000 | MEDICATED_PAD | Freq: Every day | CUTANEOUS | Status: DC
  Administered 2020-03-23 – 2020-04-06 (×6): 6 via TOPICAL

## 2020-03-23 MED ORDER — IOHEXOL 300 MG/ML  SOLN
75.0000 mL | Freq: Once | INTRAMUSCULAR | Status: AC | PRN
  Administered 2020-03-23: 75 mL via INTRAVENOUS

## 2020-03-23 MED ORDER — LISINOPRIL 20 MG PO TABS
40.0000 mg | ORAL_TABLET | Freq: Every day | ORAL | Status: DC
  Administered 2020-03-23 – 2020-04-01 (×9): 40 mg via ORAL
  Filled 2020-03-23 (×10): qty 2

## 2020-03-23 MED ORDER — POTASSIUM CHLORIDE 20 MEQ/15ML (10%) PO SOLN
40.0000 meq | Freq: Once | ORAL | Status: AC
  Administered 2020-03-23: 40 meq via ORAL
  Filled 2020-03-23: qty 30

## 2020-03-23 MED ORDER — ATORVASTATIN CALCIUM 80 MG PO TABS
80.0000 mg | ORAL_TABLET | Freq: Every day | ORAL | Status: DC
  Administered 2020-03-23 – 2020-04-06 (×14): 80 mg via ORAL
  Filled 2020-03-23 (×3): qty 1
  Filled 2020-03-23: qty 4
  Filled 2020-03-23 (×11): qty 1

## 2020-03-23 MED ORDER — POTASSIUM CHLORIDE CRYS ER 20 MEQ PO TBCR: 40.0000 meq | EXTENDED_RELEASE_TABLET | Freq: Once | ORAL | Status: DC

## 2020-03-23 MED ORDER — SODIUM CHLORIDE 0.9% FLUSH
3.0000 mL | Freq: Two times a day (BID) | INTRAVENOUS | Status: DC
  Administered 2020-03-23 – 2020-04-06 (×25): 3 mL via INTRAVENOUS

## 2020-03-23 MED ORDER — FUROSEMIDE 10 MG/ML IJ SOLN
40.0000 mg | Freq: Once | INTRAMUSCULAR | Status: AC
  Administered 2020-03-23: 40 mg via INTRAVENOUS
  Filled 2020-03-23: qty 4

## 2020-03-23 MED ORDER — ACETAMINOPHEN 325 MG PO TABS
650.0000 mg | ORAL_TABLET | Freq: Four times a day (QID) | ORAL | Status: DC | PRN
  Administered 2020-03-28 – 2020-04-05 (×7): 650 mg via ORAL
  Filled 2020-03-23 (×8): qty 2

## 2020-03-23 MED ORDER — HYDRALAZINE HCL 20 MG/ML IJ SOLN: 5.0000 mg | INTRAMUSCULAR | Status: DC | PRN

## 2020-03-23 MED ORDER — ONDANSETRON HCL 4 MG/2ML IJ SOLN: 4.0000 mg | Freq: Three times a day (TID) | INTRAMUSCULAR | Status: DC | PRN

## 2020-03-23 MED ORDER — FUROSEMIDE 10 MG/ML IJ SOLN
40.0000 mg | Freq: Two times a day (BID) | INTRAMUSCULAR | Status: DC
  Administered 2020-03-23 – 2020-03-25 (×5): 40 mg via INTRAVENOUS
  Filled 2020-03-23 (×5): qty 4

## 2020-03-23 MED ORDER — SODIUM CHLORIDE 0.9% FLUSH: 3.0000 mL | INTRAVENOUS | Status: DC | PRN

## 2020-03-23 NOTE — ED Triage Notes (Signed)
Patient from home via [facility]. Per EMS, they were called out for multiple falls and weakness. Patient denies any pain from falls. Patient noted to have irregular HR from 30s-50s with EMS. EMS also reports home meds are not being taken as prescribed but unsure as to cause.   Patient also has swelling to bilateral legs as well as abdomen.

## 2020-03-23 NOTE — H&P (Signed)
History and Physical    Jordan Perkins ZOX:[id number] DOB: 06-10-1941 DOA: 03/23/2020  Referring MD/NP/PA:   PCP: [name], MD   Patient coming from:  The patient is coming from home.  At baseline, pt is partially dependent for most of ADL.        Chief Complaint: Multiple fall, generalized weakness, leg edema  HPI: Jordan Perkins is a 79 y.o. male with medical history significant of hypertension, hyperlipidemia, atrial fibrillation on Xarelto, PAH, CAD, CABG, dCHF, anemia, PAD, gallstone pancreatitis, CKD-3, possible dementia, who presents with multiple falls, generalized weakness and leg edema.  Per his brother, patient may have dementia.  At his normal baseline, patient recognizes and talks to his brother, but does not know the time and place most of the time.  In the past several days, patient has had multiple falls and generalized weakness.  He has worsening bilateral leg edema and abdominal swelling.  His brother does not think patient has chest pain, cough, shortness of breath, fever or chills.  No active nausea, vomiting, diarrhea noted.  No dark stool or rectal bleeding noted per his brother.  Patient moves all extremities. When I saws pt in ED, he is drowsy and confused, knows his own name, but is not orientated to the place and time.  ED Course: pt was found to have BNP 574, troponin 22, 25, positive FOBT, pending COVID-19 PCR, negative urinalysis, stable renal function, potassium 3.3, temperature normal, blood pressure 153/61, heart rate 45, RR 16, oxygen saturation 100% on room air.  CT of head negative for acute issues.  Patient is admitted to progressive bed as inpatient.  CT-abd/pelvis 1. No specific evidence of acute traumatic injury to the abdomen or pelvis. No evidence of retroperitoneal hematoma. 2. Large volume fluid attenuation ascites throughout the abdomen and pelvis. Hemoperitoneum superimposed upon ascites is not strictly excluded; consider  diagnostic paracentesis if there is high concern for hemorrhage in the setting of abdominal trauma. 3. Small right pleural effusion without obvious included rib fracture. 4. Coarse, nodular contour of the liver, suggestive of cirrhosis. 5. Prominent, nonspecific bilateral inguinal and iliac lymph nodes, likely reactive. 6. Anasarca. 7. Coronary artery disease.  Aortic Atherosclerosis (ICD10-I70.0).  Review of Systems: Cannot be reviewed accurately due to possible dementia.   Allergy:  Allergies  Allergen Reactions   Sulfa Antibiotics Rash    Rash/hives     Past Medical History:  Diagnosis Date   (HFpEF) heart failure with preserved ejection fraction (HCC)    Coronary artery disease    Hyperlipidemia    Hypertension    PAH (pulmonary artery hypertension) (HCC)    Permanent atrial fibrillation (HCC)    Pleural effusion     Past Surgical History:  Procedure Laterality Date   CARDIAC CATHETERIZATION     CHOLECYSTECTOMY     CORONARY ANGIOPLASTY  06/2004   s/p stent placement @ [facility] GRAFT  04-24-2004   CABG x 3 [facility]    Social History:  reports that he has never smoked. He has never used smokeless tobacco. He reports that he does not drink alcohol and does not use drugs.  Family History:  Family History  Problem Relation Age of Onset   Heart disease Father    Heart disease Paternal Uncle    Heart attack Brother      Prior to Admission medications   Medication Sig Start Date End Date Taking? Authorizing Provider  atorvastatin (LIPITOR) 80 MG tablet TAKE 1  TABLET BY MOUTH EVERY DAY Patient taking differently: Take 80 mg by mouth daily.  12/29/19   [name], MD  furosemide (LASIX) 20 MG tablet Take 1 tablet (20 mg total) by mouth daily for 3 days. 02/26/20 02/29/20  Vanessa [place], MD  hydrochlorothiazide (HYDRODIURIL) 25 MG tablet TAKE 1 TABLET BY MOUTH EVERY DAY Patient taking differently: Take 25 mg by mouth daily.  12/29/19    [name], MD  lisinopril (ZESTRIL) 40 MG tablet Take 1 tablet (40 mg total) by mouth daily. 01/02/19 04/02/19  [name], MD  metoprolol tartrate (LOPRESSOR) 100 MG tablet TAKE 1 TABLET BY MOUTH TWICE A DAY Patient taking differently: Take 100 mg by mouth 2 (two) times daily. TAKE 1 TABLET BY MOUTH TWICE A DAY 12/29/19   [name], MD  Multiple Vitamin (MULTIVITAMIN) tablet Take 1 tablet by mouth daily.    [provider]  potassium chloride SA (K-DUR) 20 MEQ tablet Take 1 tablet (20 mEq total) by mouth daily. 02/19/19   Fritzi Mandes, MD  rivaroxaban (XARELTO) 20 MG TABS tablet Take 1 tablet (20 mg total) by mouth daily with supper. 10/26/19   [name], MD    Physical Exam: Vitals:   03/23/20 0726 03/23/20 1130 03/23/20 1230 03/23/20 1330  BP:  (!) 132/56 (!) 142/57 (!) 134/58  Pulse:  (!) 41 79 (!) 51  Resp:  15 16 14   Temp:      TempSrc:      SpO2:  100% 100% 100%  Weight: 89 kg     Height: 5\' 11"  (1.803 m)      General: Not in acute distress. Has anasarca HEENT:       Eyes: PERRL, EOMI, no scleral icterus.       ENT: No discharge from the ears and nose,        Neck: positive JVD, no bruit, no mass felt. Heme: No neck lymph node enlargement. Cardiac: S1/S2, RRR, No murmurs, No gallops or rubs. Respiratory: Good air movement bilaterally. No rales, wheezing, rhonchi or rubs. GI: Soft, nondistended, nontender,  no organomegaly, BS present. GU: No hematuria Ext: 3+ pitting leg edema bilaterally. 1+DP/PT pulse bilaterally. Musculoskeletal: No joint deformities, No joint redness or warmth, no limitation of ROM in spin. Skin: No rashes.  Neuro: confused, knows his own name, but is not oriented to place and time, cranial nerves II-XII grossly intact, moves all extremities  Psych: Patient is not psychotic, no suicidal or hemocidal ideation.  Labs on Admission: I have personally reviewed following labs and imaging studies  CBC: Recent Labs  Lab  03/23/20 0728 03/23/20 1155  WBC 6.6 7.0  HGB 9.4* 9.4*  HCT 26.6* 26.9*  MCV 98.5 98.5  PLT 155 701   Basic Metabolic Panel: Recent Labs  Lab 03/23/20 0728  NA 135  K 3.3*  CL 95*  CO2 28  GLUCOSE 141*  BUN 44*  CREATININE 1.40*  CALCIUM 9.0   GFR: Estimated Creatinine Clearance: 47.8 mL/min (A) (by C-G formula based on SCr of 1.4 mg/dL (H)). Liver Function Tests: Recent Labs  Lab 03/23/20 0728  AST 30  ALT 17  ALKPHOS 66  BILITOT 2.4*  PROT 8.0  ALBUMIN 3.2*   No results for input(s): LIPASE, AMYLASE in the last 168 hours. No results for input(s): AMMONIA in the last 168 hours. Coagulation Profile: Recent Labs  Lab 03/23/20 1155  INR 2.3*   Cardiac Enzymes: No results for input(s): CKTOTAL, CKMB, CKMBINDEX,  TROPONINI in the last 168 hours. BNP (last 3 results) No results for input(s): PROBNP in the last 8760 hours. HbA1C: No results for input(s): HGBA1C in the last 72 hours. CBG: No results for input(s): GLUCAP in the last 168 hours. Lipid Profile: No results for input(s): CHOL, HDL, LDLCALC, TRIG, CHOLHDL, LDLDIRECT in the last 72 hours. Thyroid Function Tests: No results for input(s): TSH, T4TOTAL, FREET4, T3FREE, THYROIDAB in the last 72 hours. Anemia Panel: No results for input(s): VITAMINB12, FOLATE, FERRITIN, TIBC, IRON, RETICCTPCT in the last 72 hours. Urine analysis:    Component Value Date/Time   COLORURINE YELLOW (A) 03/23/2020 0913   APPEARANCEUR CLEAR (A) 03/23/2020 0913   APPEARANCEUR Hazy 06/06/2013 0947   LABSPEC 1.011 03/23/2020 0913   LABSPEC 1.021 06/06/2013 0947   PHURINE 7.0 03/23/2020 0913   GLUCOSEU NEGATIVE 03/23/2020 0913   GLUCOSEU Negative 06/06/2013 0947   HGBUR NEGATIVE 03/23/2020 0913   BILIRUBINUR NEGATIVE 03/23/2020 0913   BILIRUBINUR Negative 06/06/2013 0947   KETONESUR NEGATIVE 03/23/2020 0913   PROTEINUR NEGATIVE 03/23/2020 0913   NITRITE NEGATIVE 03/23/2020 0913   LEUKOCYTESUR NEGATIVE 03/23/2020 0913    LEUKOCYTESUR Negative 06/06/2013 0947   Sepsis Labs: @LABRCNTIP (procalcitonin:4,lacticidven:4) ) Recent Results (from the past 240 hour(s))  SARS Coronavirus 2 by RT PCR (hospital order, performed in [place] hospital lab) Nasopharyngeal Nasopharyngeal Swab     Status: None   Collection Time: 03/23/20 11:55 AM   Specimen: Nasopharyngeal Swab  Result Value Ref Range Status   SARS Coronavirus 2 NEGATIVE NEGATIVE Final    Comment: (NOTE) SARS-CoV-2 target nucleic acids are NOT DETECTED.  The SARS-CoV-2 RNA is generally detectable in upper and lower respiratory specimens during the acute phase of infection. The lowest concentration of SARS-CoV-2 viral copies this assay can detect is 250 copies / mL. A negative result does not preclude SARS-CoV-2 infection and should not be used as the sole basis for treatment or other patient management decisions.  A negative result may occur with improper specimen collection / handling, submission of specimen other than nasopharyngeal swab, presence of viral mutation(s) within the areas targeted by this assay, and inadequate number of viral copies (<250 copies / mL). A negative result must be combined with clinical observations, patient history, and epidemiological information.  Fact Sheet for Patients:   StrictlyIdeas.no  Fact Sheet for Healthcare Providers: BankingDealers.co.za  This test is not yet approved or  cleared by the [place] FDA and has been authorized for detection and/or diagnosis of SARS-CoV-2 by FDA under an Emergency Use Authorization (EUA).  This EUA will remain in effect (meaning this test can be used) for the duration of the COVID-19 declaration under Section 564(b)(1) of the Act, 21 U.S.C. section 360bbb-3(b)(1), unless the authorization is terminated or revoked sooner.  Performed at [facility], [address] [place], [place] [address]      Radiological  Exams on Admission: CT Head Wo Contrast  Result Date: 03/23/2020 CLINICAL DATA:  Multiple falls EXAM: CT HEAD WITHOUT CONTRAST TECHNIQUE: Contiguous axial images were obtained from the base of the skull through the vertex without intravenous contrast. COMPARISON:  02/26/2020 FINDINGS: Brain: There is no acute intracranial hemorrhage, mass effect, or edema. Gray-white differentiation is preserved. There is no extra-axial fluid collection. Ventricles and sulci are stable in size and configuration. Patchy hypoattenuation in the supratentorial white matter is nonspecific but may reflect mild chronic microvascular ischemic changes. Vascular: There is atherosclerotic calcification at the skull base. Skull: Calvarium is unremarkable. Sinuses/Orbits: Right posterior  ethmoid opacification. Chronic deformity of the medial wall the right orbit. Other: Mastoid air cells are clear. IMPRESSION: No evidence of acute intracranial injury. No significant change since recent prior study. Electronically Signed   By: Macy Mis M.D.   On: 03/23/2020 09:52   CT ABDOMEN PELVIS W CONTRAST  Result Date: 03/23/2020 CLINICAL DATA:  Abdominal trauma, right flank hematoma, fall 1 week ago, hemoglobin drop, evaluate for retroperitoneal hematoma EXAM: CT ABDOMEN AND PELVIS WITH CONTRAST TECHNIQUE: Multidetector CT imaging of the abdomen and pelvis was performed using the standard protocol following bolus administration of intravenous contrast. CONTRAST:  38mL OMNIPAQUE IOHEXOL 300 MG/ML  SOLN COMPARISON:  06/11/2013 FINDINGS: Lower chest: Small right pleural effusion. Bibasilar atelectasis and/or scarring. Coronary artery calcifications. Hepatobiliary: Coarse, nodular contour of the liver. Status post cholecystectomy. No biliary dilatation. Pancreas: Unremarkable. No pancreatic ductal dilatation or surrounding inflammatory changes. Spleen: Normal in size without significant abnormality. Adrenals/Urinary Tract: Adrenal glands are  unremarkable. Kidneys are normal, without renal calculi, solid lesion, or hydronephrosis. Foley catheter in the urinary bladder. Stomach/Bowel: Stomach is within normal limits. Appendix appears normal. No evidence of bowel wall thickening, distention, or inflammatory changes. Vascular/Lymphatic: Aortic atherosclerosis. Prominent, nonspecific bilateral inguinal and iliac lymph nodes. Reproductive: No mass or other significant abnormality. Other: Anasarca. Large volume fluid attenuation ascites throughout the abdomen and pelvis. Musculoskeletal: No acute or significant osseous findings. Nonacute fracture of the left transverse process of L3 (series 3, image 39). IMPRESSION: 1. No specific evidence of acute traumatic injury to the abdomen or pelvis. No evidence of retroperitoneal hematoma. 2. Large volume fluid attenuation ascites throughout the abdomen and pelvis. Hemoperitoneum superimposed upon ascites is not strictly excluded; consider diagnostic paracentesis if there is high concern for hemorrhage in the setting of abdominal trauma. 3. Small right pleural effusion without obvious included rib fracture. 4. Coarse, nodular contour of the liver, suggestive of cirrhosis. 5. Prominent, nonspecific bilateral inguinal and iliac lymph nodes, likely reactive. 6. Anasarca. 7. Coronary artery disease.  Aortic Atherosclerosis (ICD10-I70.0). Electronically Signed   By: Eddie Candle M.D.   On: 03/23/2020 09:57   DG Chest Portable 1 View  Result Date: 03/23/2020 CLINICAL DATA:  Weakness EXAM: PORTABLE CHEST 1 VIEW COMPARISON:  02/26/2020 FINDINGS: Persistent right pleural effusion with associated atelectasis. Pulmonary vascular congestion likely superimposed on chronic interstitial prominence. No pneumothorax. Stable cardiomediastinal IMPRESSION: Persistent small right pleural effusion and right basilar atelectasis. Pulmonary vascular congestion. Electronically Signed   By: Macy Mis M.D.   On: 03/23/2020 08:35      EKG: Independently reviewed.  Atrial fibrillation, QTc 340, bradycardia with heart rate of 41, early R wave progression, RAD, mild ST depression in inferior leads  Assessment/Plan Principal Problem:   Acute on chronic diastolic CHF (congestive heart failure) (HCC) Active Problems:   Atrial fibrillation (HCC)   Hyperlipidemia   Essential hypertension   Normocytic anemia   Stage 3a chronic kidney disease   Coronary artery disease   Fall   Hypokalemia   Elevated troponin   Acute on chronic diastolic CHF (congestive heart failure) ([place]): 2D echo on 02/17/2019 showed EF 65%.  Patient has elevated BNP 574, anasarca, 3+ leg edema, chest x-ray showed vascular congestion, clinically consistent with CHF exacerbation.  -Will admit to progressive unit as inpatient -Lasix 40 mg bid by IV -2d echo -Daily weights -strict I/O's -Low salt diet -Fluid restriction -Obtain REDs Vest reading  Coronary artery disease and elevated trop: s/p of CBAG. Trop 22 -->25. No CP.  Likely due  to demand ischemia -Continue Lipitor -Check A1c, FLP, trend troponin -Repeat EKG in morning -Follow-up 2D echo  Atrial fibrillation [facility]): Heart rate 45 -Hold Xarelto due to positive FOBT and worsening anemia -Hold metoprolol due to bradycardia  Hyperlipidemia -Lipitor  Essential hypertension -IV hydralazine as needed -Patient is IV Lasix -Hold metoprolol due to bradycardia  Normocytic anemia: Hemoglobin 11.3 on 02/26/20 -->9.4 -->9.4 -->10.3.  Per ED physician, patient had a positive FOBT -hold Xarelto -Protonix 40 mg twice daily -Follow-up CBC every 6 hours -May need outpatient GI work-up  Stage 3a chronic kidney disease: Stable -Follow-up by BMP  Fall: CT head negative. -PT/OT -Fall precaution  Hypokalemia: K= 3.3 on admission. - Repleted - Check Mg level    DVT ppx: SCD Code Status: Full code per his brother Family Communication:  Yes, patient's brother by phone Disposition Plan:   Anticipate discharge back to previous environment Consults called:  none Admission status: progressive unit as inpt     Status is: Inpatient  Remains inpatient appropriate because:Inpatient level of care appropriate due to severity of illness.  Patient has multiple comorbidities, now presents with acute on chronic diastolic CHF exacerbation, with anasarca multiple falls, also has multiple falls, worsening anemia, positive troponin.  His presentation is highly complicated.  Patient is at high risk of deteriorating.  Need to be treated in hospital for at least 2 days.   Dispo: The patient is from: Home              Anticipated d/c is to: Home              Anticipated d/c date is: 2 days              Patient currently is not medically stable to d/c.           Date of Service 03/23/2020    Ivor Costa [facility]   If 7PM-7AM, please contact night-coverage [email or web address] 03/23/2020, 3:07 PM

## 2020-03-23 NOTE — TOC Initial Note (Signed)
Transition of Care [facility]) - Initial/Assessment Note    Patient Details  Name: Malvin Morrish MRN: [id number] Date of Birth: [birth year]  [facility] of [facility]) CM/SW Contact:    Anselm Pancoast, RN Phone Number: 03/23/2020, 3:58 PM  Clinical Narrative:                 Spoke to patient at bedside. Patient states he lives with his family and wants to return home however it is unclear if patient is alert and oriented. Very lethargic and difficult to follow. TOC will follow up once patient more alert.         Patient Goals and CMS Choice        Expected Discharge Plan and Services                                                Prior Living Arrangements/Services                       Activities of Daily Living      Permission Sought/Granted                  Emotional Assessment              Admission diagnosis:  Acute on chronic diastolic CHF (congestive heart failure) ([place]) [I50.33] Patient Active Problem List   Diagnosis Date Noted  . Acute on chronic diastolic CHF (congestive heart failure) ([place]) 03/23/2020  . Coronary artery disease   . Fall   . Hypokalemia   . Elevated troponin   . Anasarca   . Bradycardia   . Stage 3a chronic kidney disease 07/12/2019  . Prediabetes 06/12/2019  . Normocytic anemia 03/10/2019  . Pulmonary hypertension, unspecified ([place])   . Pleural effusion   . CHF (congestive heart failure) ([place]) 02/16/2019  . PAD (peripheral artery disease) ([place]) 01/02/2019  . Bilateral carotid artery disease ([place]) 01/02/2019  . Memory difficulty 11/05/2016  . Seborrheic keratoses 11/05/2016  . Hyponatremia 10/29/2016  . Encounter for anticoagulation discussion and counseling 07/21/2014  . CAD (coronary artery disease) 06/23/2013  . 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  . Atrial fibrillation ([place]) 06/23/2013  . Hyperlipidemia 06/23/2013  . Essential hypertension 06/23/2013  . Gallstone pancreatitis  06/23/2013   PCP:  [name], MD Pharmacy:   [id number] - [place], [place] [address] [place] [address] Phone: [phone] Fax: [phone]     Social Determinants of Health (SDOH) Interventions    Readmission Risk Interventions No flowsheet data found.

## 2020-03-23 NOTE — ED Provider Notes (Signed)
[facility] Emergency Department Provider Note    First MD Initiated Contact with Patient 03/23/20 [phone]     (approximate)  I have reviewed the triage vital signs and the nursing notes.   HISTORY  Chief Complaint Weakness    HPI Quantay Zaremba is a 79 y.o. male with the below listed past medical history presents to the ER for worsening weakness and difficulty walking due to shortness of breath and swelling.  Feels that his abdomen is now becoming more distended as well.  He denies any pain.  No chest pressure.  Patient noted to be bradycardic.  On review of records it does appear that he was having slow A. fib previously and recommended to decrease his metoprolol.  Per EMS there is some concern to compliance and medication mismanagement.  Reportedly is on Xarelto for history of A. fib.  On questioning the patient he does seem pleasantly confused but does not seem very reliable on his medication history.    Past Medical History:  Diagnosis Date  . (HFpEF) heart failure with preserved ejection fraction ([place])   . Coronary artery disease   . Hyperlipidemia   . Hypertension   . PAH (pulmonary artery hypertension) ([place])   . Permanent atrial fibrillation ([employer])   . Pleural effusion    Family History  Problem Relation Age of Onset  . Heart disease Father   . Heart disease Paternal Uncle   . Heart attack Brother    Past Surgical History:  Procedure Laterality Date  . CARDIAC CATHETERIZATION    . CHOLECYSTECTOMY    . CORONARY ANGIOPLASTY  06/2004   s/p stent placement @ [facility]  . CORONARY ARTERY BYPASS GRAFT  04-24-2004   CABG x 3 [facility]   Patient Active Problem List   Diagnosis Date Noted  . Coronary artery disease   . Fall   . Hypokalemia   . Elevated troponin   . Stage 3a chronic kidney disease 07/12/2019  . Prediabetes 06/12/2019  . Normocytic anemia 03/10/2019  . Pulmonary hypertension, unspecified ([place])   . Pleural effusion   . CHF (congestive  heart failure) ([place]) 02/16/2019  . PAD (peripheral artery disease) ([place]) 01/02/2019  . Bilateral carotid artery disease ([place]) 01/02/2019  . Memory difficulty 11/05/2016  . Seborrheic keratoses 11/05/2016  . Hyponatremia 10/29/2016  . Encounter for anticoagulation discussion and counseling 07/21/2014  . CAD (coronary artery disease) 06/23/2013  . 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  . Atrial fibrillation ([place]) 06/23/2013  . Hyperlipidemia 06/23/2013  . Essential hypertension 06/23/2013  . Gallstone pancreatitis 06/23/2013      Prior to Admission medications   Medication Sig Start Date End Date Taking? Authorizing Provider  atorvastatin (LIPITOR) 80 MG tablet TAKE 1 TABLET BY MOUTH EVERY DAY Patient taking differently: Take 80 mg by mouth daily.  12/29/19   [name], MD  furosemide (LASIX) 20 MG tablet Take 1 tablet (20 mg total) by mouth daily for 3 days. 02/26/20 02/29/20  Vanessa Riner, MD  hydrochlorothiazide (HYDRODIURIL) 25 MG tablet TAKE 1 TABLET BY MOUTH EVERY DAY Patient taking differently: Take 25 mg by mouth daily.  12/29/19   [name], MD  lisinopril (ZESTRIL) 40 MG tablet Take 1 tablet (40 mg total) by mouth daily. 01/02/19 04/02/19  [name], MD  metoprolol tartrate (LOPRESSOR) 100 MG tablet TAKE 1 TABLET BY MOUTH TWICE A DAY Patient taking differently: Take 100 mg by mouth 2 (two) times daily. TAKE 1  TABLET BY MOUTH TWICE A DAY 12/29/19   [name], MD  Multiple Vitamin (MULTIVITAMIN) tablet Take 1 tablet by mouth daily.    [provider]  potassium chloride SA (K-DUR) 20 MEQ tablet Take 1 tablet (20 mEq total) by mouth daily. 02/19/19   Fritzi Mandes, MD  rivaroxaban (XARELTO) 20 MG TABS tablet Take 1 tablet (20 mg total) by mouth daily with supper. 10/26/19   [name], MD    Allergies Sulfa antibiotics    Social History Social History   Tobacco Use  . Smoking status: Never Smoker  . Smokeless tobacco:  Never Used  Substance Use Topics  . Alcohol use: No  . Drug use: No    Review of Systems Patient denies headaches, rhinorrhea, blurry vision, numbness, shortness of breath, chest pain, edema, cough, abdominal pain, nausea, vomiting, diarrhea, dysuria, fevers, rashes or hallucinations unless otherwise stated above in HPI. ____________________________________________   PHYSICAL EXAM:  VITAL SIGNS: Vitals:   03/23/20 0724  BP: (!) 153/61  Pulse: (!) 45  Resp: 16  Temp: 97.7 F (36.5 C)  SpO2: 100%    Constitutional: Alert, pleasant, disoriented.  Eyes: Conjunctivae are normal.  Head: Atraumatic. Nose: No congestion/rhinnorhea. Mouth/Throat: Mucous membranes are moist.   Neck: No stridor. Painless ROM.  Cardiovascular: Normal rate, regular rhythm. Grossly normal heart sounds.  Good peripheral circulation. Respiratory: Normal respiratory effort.  No retractions. Lungs CTAB. Gastrointestinal: distended with positive fluid wave but non tender.  Suprapubic fullness, right low flank ecchymosis. No abdominal bruits. No CVA tenderness. Genitourinary: guaiac positive stool Musculoskeletal: No lower extremity tenderness nor edema.  No joint effusions. Neurologic:  Normal speech and language. No gross focal neurologic deficits are appreciated. No facial droop Skin:  Skin is warm, dry and intact. No rash noted. Psychiatric: Mood and affect are normal. Speech and behavior are normal.  ____________________________________________   LABS (all labs ordered are listed, but only abnormal results are displayed)  Results for orders placed or performed during the hospital encounter of 03/23/20 (from the past 24 hour(s))  Basic metabolic panel     Status: Abnormal   Collection Time: 03/23/20  7:28 AM  Result Value Ref Range   Sodium 135 135 - 145 mmol/L   Potassium 3.3 (L) 3.5 - 5.1 mmol/L   Chloride 95 (L) 98 - 111 mmol/L   CO2 28 22 - 32 mmol/L   Glucose, Bld 141 (H) 70 - 99 mg/dL   BUN  44 (H) 8 - 23 mg/dL   Creatinine, Ser 1.40 (H) 0.61 - 1.24 mg/dL   Calcium 9.0 8.9 - 10.3 mg/dL   GFR calc non Af Amer 48 (L) >60 mL/min   GFR calc Af Amer 56 (L) >60 mL/min   Anion gap 12 5 - 15  CBC     Status: Abnormal   Collection Time: 03/23/20  7:28 AM  Result Value Ref Range   WBC 6.6 4.0 - 10.5 K/uL   RBC 2.70 (L) 4.22 - 5.81 MIL/uL   Hemoglobin 9.4 (L) 13.0 - 17.0 g/dL   HCT 26.6 (L) 39 - 52 %   MCV 98.5 80.0 - 100.0 fL   MCH 34.8 (H) 26.0 - 34.0 pg   MCHC 35.3 30.0 - 36.0 g/dL   RDW 14.6 11.5 - 15.5 %   Platelets 155 150 - 400 K/uL   nRBC 0.0 0.0 - 0.2 %  Troponin I (High Sensitivity)     Status: Abnormal   Collection Time: 03/23/20  7:28  AM  Result Value Ref Range   Troponin I (High Sensitivity) 22 (H) <18 ng/L  Hepatic function panel     Status: Abnormal   Collection Time: 03/23/20  7:28 AM  Result Value Ref Range   Total Protein 8.0 6.5 - 8.1 g/dL   Albumin 3.2 (L) 3.5 - 5.0 g/dL   AST 30 15 - 41 U/L   ALT 17 0 - 44 U/L   Alkaline Phosphatase 66 38 - 126 U/L   Total Bilirubin 2.4 (H) 0.3 - 1.2 mg/dL   Bilirubin, Direct 0.7 (H) 0.0 - 0.2 mg/dL   Indirect Bilirubin 1.7 (H) 0.3 - 0.9 mg/dL  Brain natriuretic peptide     Status: Abnormal   Collection Time: 03/23/20  7:28 AM  Result Value Ref Range   B Natriuretic Peptide 574.7 (H) 0.0 - 100.0 pg/mL  Urinalysis, Complete w Microscopic     Status: Abnormal   Collection Time: 03/23/20  9:13 AM  Result Value Ref Range   Color, Urine YELLOW (A) YELLOW   APPearance CLEAR (A) CLEAR   Specific Gravity, Urine 1.011 1.005 - 1.030   pH 7.0 5.0 - 8.0   Glucose, UA NEGATIVE NEGATIVE mg/dL   Hgb urine dipstick NEGATIVE NEGATIVE   Bilirubin Urine NEGATIVE NEGATIVE   Ketones, ur NEGATIVE NEGATIVE mg/dL   Protein, ur NEGATIVE NEGATIVE mg/dL   Nitrite NEGATIVE NEGATIVE   Leukocytes,Ua NEGATIVE NEGATIVE   RBC / HPF 0-5 0 - 5 RBC/hpf   WBC, UA 0-5 0 - 5 WBC/hpf   Bacteria, UA NONE SEEN NONE SEEN   Squamous Epithelial / LPF  NONE SEEN 0 - 5   Mucus PRESENT    ____________________________________________  EKG My review and personal interpretation at Time: 7:26   Indication: bradycardia  Rate: 40  Rhythm: afib with SVR Axis: normal Other: nonspecific st abn, no stemi ____________________________________________  RADIOLOGY  I personally reviewed all radiographic images ordered to evaluate for the above acute complaints and reviewed radiology reports and findings.  These findings were personally discussed with the patient.  Please see medical record for radiology report.  ____________________________________________   PROCEDURES  Procedure(s) performed:  Procedures    Critical Care performed: no ____________________________________________   INITIAL IMPRESSION / ASSESSMENT AND PLAN / ED COURSE  Pertinent labs & imaging results that were available during my care of the patient were reviewed by me and considered in my medical decision making (see chart for details).   DDX: chf, anasarca, ascites, hematoma, hemorrhage, medication noncompliance  Aviraj Kentner is a 79 y.o. who presents to the ED with symptoms as described above.  Patient does have evidence of anasarca.  Blood work also shows evidence of drop in hemoglobin does have weakly positive guaiac stool but no melena or hematochezia.  No evidence of active hemorrhage.  CT imaging ordered for the but differential does not show evidence of acute traumatic injury.  Favor ascites and anasarca over hemoperitoneum as he does not have any acute pain.  Will place on Protonix.  I have given IV Lasix.  Will discuss with hospitalist for admission.     The patient was evaluated in Emergency Department today for the symptoms described in the history of present illness. He/she was evaluated in the context of the global COVID-19 pandemic, which necessitated consideration that the patient might be at risk for infection with the SARS-CoV-2 virus that causes  COVID-19. Institutional protocols and algorithms that pertain to the evaluation of patients at risk for COVID-19 are  in a state of rapid change based on information released by regulatory bodies including the CDC and federal and state organizations. These policies and algorithms were followed during the patient's care in the ED.  As part of my medical decision making, I reviewed the following data within the [place] notes reviewed and incorporated, Labs reviewed, notes from prior ED visits and  Controlled Substance Database   ____________________________________________   FINAL CLINICAL IMPRESSION(S) / ED DIAGNOSES  Final diagnoses:  Anasarca  Bradycardia      NEW MEDICATIONS STARTED DURING THIS VISIT:  New Prescriptions   No medications on file     Note:  This document was prepared using Dragon voice recognition software and may include unintentional dictation errors.    Merlyn Lot, MD 03/23/20 1110

## 2020-03-23 NOTE — ED Notes (Signed)
Patient reports urge to void but unable to use urinal. State this has happened in the past. Penis noted to be edematous but pink. Bladder scan showed 565 ml. MD assessed with ultrasound due to possible misread from ascites. Bladder confirmed to be full. Verbal order for foley given

## 2020-03-24 ENCOUNTER — Inpatient Hospital Stay: Payer: Medicare HMO

## 2020-03-24 DIAGNOSIS — I5033 Acute on chronic diastolic (congestive) heart failure: Secondary | ICD-10-CM | POA: Diagnosis not present

## 2020-03-24 DIAGNOSIS — G9341 Metabolic encephalopathy: Secondary | ICD-10-CM

## 2020-03-24 DIAGNOSIS — I10 Essential (primary) hypertension: Secondary | ICD-10-CM | POA: Diagnosis not present

## 2020-03-24 LAB — CBC
HCT: 27.2 % — ABNORMAL LOW (ref 39.0–52.0)
Hemoglobin: 9.5 g/dL — ABNORMAL LOW (ref 13.0–17.0)
MCH: 34.3 pg — ABNORMAL HIGH (ref 26.0–34.0)
MCHC: 34.9 g/dL (ref 30.0–36.0)
MCV: 98.2 fL (ref 80.0–100.0)
Platelets: 175 10*3/uL (ref 150–400)
RBC: 2.77 MIL/uL — ABNORMAL LOW (ref 4.22–5.81)
RDW: 14.7 % (ref 11.5–15.5)
WBC: 8.1 10*3/uL (ref 4.0–10.5)
nRBC: 0 % (ref 0.0–0.2)

## 2020-03-24 LAB — GLUCOSE, CAPILLARY
Glucose-Capillary: 109 mg/dL — ABNORMAL HIGH (ref 70–99)
Glucose-Capillary: 116 mg/dL — ABNORMAL HIGH (ref 70–99)
Glucose-Capillary: 103 mg/dL — ABNORMAL HIGH (ref 70–99)
Glucose-Capillary: 134 mg/dL — ABNORMAL HIGH (ref 70–99)
Glucose-Capillary: 132 mg/dL — ABNORMAL HIGH (ref 70–99)

## 2020-03-24 LAB — MAGNESIUM: Magnesium: 1.6 mg/dL — ABNORMAL LOW (ref 1.7–2.4)

## 2020-03-24 LAB — LIPID PANEL
Cholesterol: 75 mg/dL (ref 0–200)
HDL: 21 mg/dL — ABNORMAL LOW (ref >40)
LDL Cholesterol: 46 mg/dL (ref 0–99)
Total CHOL/HDL Ratio: 3.6 RATIO
Triglycerides: 42 mg/dL (ref <150)
VLDL: 8 mg/dL (ref 0–40)

## 2020-03-24 LAB — BASIC METABOLIC PANEL
Anion gap: 12 (ref 5–15)
BUN: 36 mg/dL — ABNORMAL HIGH (ref 8–23)
CO2: 29 mmol/L (ref 22–32)
Calcium: 9 mg/dL (ref 8.9–10.3)
Chloride: 97 mmol/L — ABNORMAL LOW (ref 98–111)
Creatinine, Ser: 1.24 mg/dL (ref 0.61–1.24)
GFR calc Af Amer: >60 mL/min (ref >60)
GFR calc non Af Amer: 56 mL/min — ABNORMAL LOW (ref >60)
Glucose, Bld: 133 mg/dL — ABNORMAL HIGH (ref 70–99)
Potassium: 3.3 mmol/L — ABNORMAL LOW (ref 3.5–5.1)
Sodium: 138 mmol/L (ref 135–145)

## 2020-03-24 LAB — ECHOCARDIOGRAM COMPLETE
Height: 71 in
S' Lateral: 3.21 cm
Weight: 3139.35 oz

## 2020-03-24 LAB — HEMOGLOBIN A1C
Hgb A1c MFr Bld: 5.8 % — ABNORMAL HIGH (ref 4.8–5.6)
Mean Plasma Glucose: 119.76 mg/dL

## 2020-03-24 MED ORDER — MORPHINE SULFATE (PF) 2 MG/ML IV SOLN
1.0000 mg | Freq: Once | INTRAVENOUS | Status: AC
  Administered 2020-03-25: 1 mg via INTRAVENOUS
  Filled 2020-03-24: qty 1

## 2020-03-24 MED ORDER — MAGNESIUM SULFATE 4 GM/100ML IV SOLN
4.0000 g | Freq: Once | INTRAVENOUS | Status: AC
  Administered 2020-03-24: 4 g via INTRAVENOUS
  Filled 2020-03-24: qty 100

## 2020-03-24 MED ORDER — POTASSIUM CHLORIDE CRYS ER 20 MEQ PO TBCR
40.0000 meq | EXTENDED_RELEASE_TABLET | Freq: Once | ORAL | Status: DC
  Filled 2020-03-24: qty 2

## 2020-03-24 NOTE — Plan of Care (Signed)

## 2020-03-24 NOTE — Progress Notes (Signed)
Ch arrived at room in response to RR. Care team working on Pt to get his attention at this time. Ch passed visit on to Ch. Jordan Perkins to be relieved to go to another PG. Ch will check back on Pt later.

## 2020-03-24 NOTE — Progress Notes (Signed)
PROGRESS NOTE    Jordan Perkins  DXI:[id number] DOB: 07/07/1941 DOA: 03/23/2020 PCP: [name], MD    Assessment & Plan:   Principal Problem:   Acute on chronic diastolic CHF (congestive heart failure) ([place]) Active Problems:   Atrial fibrillation ([place])   Hyperlipidemia   Essential hypertension   Normocytic anemia   Stage 3a chronic kidney disease   Coronary artery disease   Fall   Hypokalemia   Elevated troponin   Acute on chronic diastolic CHF: echo on 03/15/7340 showed EF 65%. Continue on  IV lasix. Strict I/Os & daily weights. Fluid restriction. Neg fluid balance. Echo shows EF 60-65%, pulmonary HTN, unable to determine diastolic function. Will need close outpatient f/u  Likely metabolic encephalopathy: etiology unclear. Rapid response called as pt would not respond other than saying "hey" and would not respond to painful stimuli on 03/24/20. CT brain shows no acute intracranial findings. Called pt's brother to discuss pt's baseline but no answer and unable to leave a message   CAD: s/p of CBAG. W/ elevated troponins, likely due to demand ischemia. No chest pain  Atrial fibrillation: likely PAF. Continue to hold metoprolol secondary to bradycardia. Hold Xarelto due to positive FOBT but H&H stable today and if stable tomorrow will restart xarelto   Hyperlipidemia: continue on statin   Essential hypertension: hold metoprolol due to bradycardia. IV hydralazine prn   Normocytic anemia: per ED physician, patient had a positive FOBT. Hold Xarelto. Continue on protonix.  CKDIIIa: Cr continues to trend down. Will continue to monitor    Fall: CT head negative. PT/OT consulted   Hypokalemia: KCl repleted. Will continue to monitor   Hypomagnesemia: mg sulfate given. Will continue to monitor    DVT prophylaxis: SCDs Code Status: full  Family Communication: called pt's brother but no answer and unable to leave a message Disposition Plan: depends on PT/OT  recs  Status is: Inpatient  Remains inpatient appropriate because:Altered mental status   Dispo: The patient is from: Home              Anticipated d/c is to: SNF              Anticipated d/c date is: 3 days              Patient currently is not medically stable to d/c.         Consultants:      Procedures:    Antimicrobials:    Subjective: Pt is not answering any of my questions   Objective: Vitals:   03/23/20 1738 03/23/20 2018 03/24/20 0451 03/24/20 0722  BP:  (!) 156/51 (!) 137/48 (!) 130/47  Pulse:  (!) 101 (!) 42 61  Resp:    17  Temp:  97.7 F (36.5 C) 97.7 F (36.5 C) 98 F (36.7 C)  TempSrc:    Oral  SpO2: 100% 100% 98% 97%  Weight:   78.9 kg   Height:        Intake/Output Summary (Last 24 hours) at 03/24/2020 0820 Last data filed at 03/24/2020 0458 Gross per 24 hour  Intake --  Output 5750 ml  Net -5750 ml   Filed Weights   03/23/20 0726 03/23/20 1718 03/24/20 0451  Weight: 89 kg 81.2 kg 78.9 kg    Examination:  General exam: Appears calm and comfortable  Respiratory system: diminished breath sounds b/l. No rales Cardiovascular system: bradycardia. No rubs, gallops or clicks.  Gastrointestinal system: Abdomen is nondistended, soft and nontender.  Normal bowel sounds heard. Central nervous system: lethargic. Unable to follow simple commands Psychiatry: Judgement and insight appear abnormal. Flat mood and affect    Data Reviewed: I have personally reviewed following labs and imaging studies  CBC: Recent Labs  Lab 03/23/20 0728 03/23/20 1155 03/23/20 1733 03/23/20 2328 03/24/20 0434  WBC 6.6 7.0 7.9 9.3 8.1  HGB 9.4* 9.4* 10.3* 9.6* 9.5*  HCT 26.6* 26.9* 30.4* 26.8* 27.2*  MCV 98.5 98.5 100.3* 97.1 98.2  PLT 155 172 169 173 449   Basic Metabolic Panel: Recent Labs  Lab 03/23/20 0728 03/24/20 0434  NA 135 138  K 3.3* 3.3*  CL 95* 97*  CO2 28 29  GLUCOSE 141* 133*  BUN 44* 36*  CREATININE 1.40* 1.24  CALCIUM 9.0  9.0  MG  --  1.6*   GFR: Estimated Creatinine Clearance: 54 mL/min (by C-G formula based on SCr of 1.24 mg/dL). Liver Function Tests: Recent Labs  Lab 03/23/20 0728  AST 30  ALT 17  ALKPHOS 66  BILITOT 2.4*  PROT 8.0  ALBUMIN 3.2*   No results for input(s): LIPASE, AMYLASE in the last 168 hours. No results for input(s): AMMONIA in the last 168 hours. Coagulation Profile: Recent Labs  Lab 03/23/20 1155  INR 2.3*   Cardiac Enzymes: No results for input(s): CKTOTAL, CKMB, CKMBINDEX, TROPONINI in the last 168 hours. BNP (last 3 results) No results for input(s): PROBNP in the last 8760 hours. HbA1C: No results for input(s): HGBA1C in the last 72 hours. CBG: Recent Labs  Lab 03/23/20 2038 03/24/20 0722  GLUCAP 127* 109*   Lipid Profile: Recent Labs    03/24/20 0434  CHOL 75  HDL 21*  LDLCALC 46  TRIG 42  CHOLHDL 3.6   Thyroid Function Tests: No results for input(s): TSH, T4TOTAL, FREET4, T3FREE, THYROIDAB in the last 72 hours. Anemia Panel: No results for input(s): VITAMINB12, FOLATE, FERRITIN, TIBC, IRON, RETICCTPCT in the last 72 hours. Sepsis Labs: No results for input(s): PROCALCITON, LATICACIDVEN in the last 168 hours.  Recent Results (from the past 240 hour(s))  SARS Coronavirus 2 by RT PCR (hospital order, performed in [facility] hospital lab) Nasopharyngeal Nasopharyngeal Swab     Status: None   Collection Time: 03/23/20 11:55 AM   Specimen: Nasopharyngeal Swab  Result Value Ref Range Status   SARS Coronavirus 2 NEGATIVE NEGATIVE Final    Comment: (NOTE) SARS-CoV-2 target nucleic acids are NOT DETECTED.  The SARS-CoV-2 RNA is generally detectable in upper and lower respiratory specimens during the acute phase of infection. The lowest concentration of SARS-CoV-2 viral copies this assay can detect is 250 copies / mL. A negative result does not preclude SARS-CoV-2 infection and should not be used as the sole basis for treatment or other patient  management decisions.  A negative result may occur with improper specimen collection / handling, submission of specimen other than nasopharyngeal swab, presence of viral mutation(s) within the areas targeted by this assay, and inadequate number of viral copies (<250 copies / mL). A negative result must be combined with clinical observations, patient history, and epidemiological information.  Fact Sheet for Patients:   StrictlyIdeas.no  Fact Sheet for Healthcare Providers: BankingDealers.co.za  This test is not yet approved or  cleared by the [place] FDA and has been authorized for detection and/or diagnosis of SARS-CoV-2 by FDA under an Emergency Use Authorization (EUA).  This EUA will remain in effect (meaning this test can be used) for the duration of the COVID-19 declaration  under Section 564(b)(1) of the Act, 21 U.S.C. section 360bbb-3(b)(1), unless the authorization is terminated or revoked sooner.  Performed at [facility], [address] [place], [place] [address]          Radiology Studies: CT Head Wo Contrast  Result Date: 03/23/2020 CLINICAL DATA:  Multiple falls EXAM: CT HEAD WITHOUT CONTRAST TECHNIQUE: Contiguous axial images were obtained from the base of the skull through the vertex without intravenous contrast. COMPARISON:  02/26/2020 FINDINGS: Brain: There is no acute intracranial hemorrhage, mass effect, or edema. Gray-white differentiation is preserved. There is no extra-axial fluid collection. Ventricles and sulci are stable in size and configuration. Patchy hypoattenuation in the supratentorial white matter is nonspecific but may reflect mild chronic microvascular ischemic changes. Vascular: There is atherosclerotic calcification at the skull base. Skull: Calvarium is unremarkable. Sinuses/Orbits: Right posterior ethmoid opacification. Chronic deformity of the medial wall the right orbit. Other:  Mastoid air cells are clear. IMPRESSION: No evidence of acute intracranial injury. No significant change since recent prior study. Electronically Signed   By: Macy Mis M.D.   On: 03/23/2020 09:52   CT ABDOMEN PELVIS W CONTRAST  Result Date: 03/23/2020 CLINICAL DATA:  Abdominal trauma, right flank hematoma, fall 1 week ago, hemoglobin drop, evaluate for retroperitoneal hematoma EXAM: CT ABDOMEN AND PELVIS WITH CONTRAST TECHNIQUE: Multidetector CT imaging of the abdomen and pelvis was performed using the standard protocol following bolus administration of intravenous contrast. CONTRAST:  6mL OMNIPAQUE IOHEXOL 300 MG/ML  SOLN COMPARISON:  06/11/2013 FINDINGS: Lower chest: Small right pleural effusion. Bibasilar atelectasis and/or scarring. Coronary artery calcifications. Hepatobiliary: Coarse, nodular contour of the liver. Status post cholecystectomy. No biliary dilatation. Pancreas: Unremarkable. No pancreatic ductal dilatation or surrounding inflammatory changes. Spleen: Normal in size without significant abnormality. Adrenals/Urinary Tract: Adrenal glands are unremarkable. Kidneys are normal, without renal calculi, solid lesion, or hydronephrosis. Foley catheter in the urinary bladder. Stomach/Bowel: Stomach is within normal limits. Appendix appears normal. No evidence of bowel wall thickening, distention, or inflammatory changes. Vascular/Lymphatic: Aortic atherosclerosis. Prominent, nonspecific bilateral inguinal and iliac lymph nodes. Reproductive: No mass or other significant abnormality. Other: Anasarca. Large volume fluid attenuation ascites throughout the abdomen and pelvis. Musculoskeletal: No acute or significant osseous findings. Nonacute fracture of the left transverse process of L3 (series 3, image 39). IMPRESSION: 1. No specific evidence of acute traumatic injury to the abdomen or pelvis. No evidence of retroperitoneal hematoma. 2. Large volume fluid attenuation ascites throughout the abdomen  and pelvis. Hemoperitoneum superimposed upon ascites is not strictly excluded; consider diagnostic paracentesis if there is high concern for hemorrhage in the setting of abdominal trauma. 3. Small right pleural effusion without obvious included rib fracture. 4. Coarse, nodular contour of the liver, suggestive of cirrhosis. 5. Prominent, nonspecific bilateral inguinal and iliac lymph nodes, likely reactive. 6. Anasarca. 7. Coronary artery disease.  Aortic Atherosclerosis (ICD10-I70.0). Electronically Signed   By: Eddie Candle M.D.   On: 03/23/2020 09:57   DG Chest Portable 1 View  Result Date: 03/23/2020 CLINICAL DATA:  Weakness EXAM: PORTABLE CHEST 1 VIEW COMPARISON:  02/26/2020 FINDINGS: Persistent right pleural effusion with associated atelectasis. Pulmonary vascular congestion likely superimposed on chronic interstitial prominence. No pneumothorax. Stable cardiomediastinal IMPRESSION: Persistent small right pleural effusion and right basilar atelectasis. Pulmonary vascular congestion. Electronically Signed   By: Macy Mis M.D.   On: 03/23/2020 08:35        Scheduled Meds: . atorvastatin  80 mg Oral Daily  . Chlorhexidine Gluconate Cloth  6 each  Topical Daily  . furosemide  40 mg Intravenous Q12H  . lisinopril  40 mg Oral Daily  . pantoprazole (PROTONIX) IV  40 mg Intravenous Q12H  . sodium chloride flush  3 mL Intravenous Q12H   Continuous Infusions: . sodium chloride       LOS: 1 day    Time spent: 32 mins     Wyvonnia Dusky, MD [facility] Pager [phone]  If 7PM-7AM, please contact night-coverage [email or web address] 03/24/2020, 8:20 AM

## 2020-03-24 NOTE — Progress Notes (Signed)
OT Cancellation Note  Patient Details Name: Jordan Perkins MRN: [id number] DOB: 07-Jun-1941   Cancelled Treatment:    Reason Eval/Treat Not Completed: Medical issues which prohibited therapy. Consult received and chart reviewed. Patient with recent rapid response event per notes. Will hold OT eval at this time and re-attempt at later time/date as medically stable and appropriate.  Jeni Salles, MPH, MS, OTR/L ascom [phone] 03/24/20, 10:46 AM

## 2020-03-24 NOTE — Progress Notes (Signed)
   03/24/20 0945  Assess: MEWS Score  Level of Consciousness Responds to Pain  Assess: MEWS Score  MEWS Temp 0  MEWS Systolic 0  MEWS Pulse 0  MEWS RR 0  MEWS LOC 2  MEWS Score 2  MEWS Score Color Yellow  Assess: if the MEWS score is Yellow or Red  Were vital signs taken at a resting state? Yes  Focused Assessment Documented focused assessment  Early Detection of Sepsis Score *See Row Information* Low  MEWS guidelines implemented *See Row Information* Yes  Treat  MEWS Interventions Administered scheduled meds/treatments  Take Vital Signs  Increase Vital Sign Frequency  Yellow: Q 2hr X 2 then Q 4hr X 2, if remains yellow, continue Q 4hrs  Escalate  MEWS: Escalate Yellow: discuss with charge nurse/RN and consider discussing with provider and RRT  Notify: Charge Nurse/RN  Name of Charge Nurse/RN Notified Colletta Maryland RN   Date Charge Nurse/RN Notified 03/24/20  Time Charge Nurse/RN Notified 0945  Notify: Provider  Provider Name/Title Dr. Jimmye Norman   Date Provider Notified 03/24/20  Time Provider Notified [phone]  Notification Type Call (secure chat and paged )  Notification Reason Change in status  Response See new orders  Date of Provider Response 03/24/20  Time of Provider Response 0955  Notify: Rapid Response  Name of Rapid Response RN Notified Roselyn Reef Rn   Date Rapid Response Notified 03/24/20  Time Rapid Response Notified 1000  Document  Patient Outcome Not stable and remains on department  Progress note created (see row info) Yes   Stat CT order- vital signs remained stable

## 2020-03-24 NOTE — Progress Notes (Signed)
PT Cancellation Note  Patient Details Name: [name] MRN: [id number] DOB: 01-27-1941   Cancelled Treatment:    Reason Eval/Treat Not Completed: Medical issues which prohibited therapy (Consult received and chart reviewed. Patient with recent rapid response event per notes. Will hold PT eval at this time and re-attempt at later time/date as medically stable and appropriate.)   Rance Smithson H. Owens Shark, PT, DPT, NCS 03/24/20, 10:22 AM [phone]

## 2020-03-24 NOTE — Progress Notes (Signed)
This RT responded to Rapid Response, patients saturations were 100% on room air and was in no distress, but confused. Stayed at bedside until the ok to leave by ICU RN.

## 2020-03-24 NOTE — Progress Notes (Signed)
Noted on assessment-  patient is unable to communicate or follow commands. This is a change from prior assessment.  Pt would respond to painful stimulus on his feet but he would not respond to sternal rub or painful stimuli on upper extremities.   Patient would not open his eyes or tell me his name.  He would only repeat the word "Yes".   Charge nurse made aware and the rapid response and MD were called.  All vital were WNL; Stat head CT ordered

## 2020-03-25 DIAGNOSIS — I5033 Acute on chronic diastolic (congestive) heart failure: Secondary | ICD-10-CM | POA: Diagnosis not present

## 2020-03-25 DIAGNOSIS — E876 Hypokalemia: Secondary | ICD-10-CM | POA: Diagnosis not present

## 2020-03-25 DIAGNOSIS — G9341 Metabolic encephalopathy: Secondary | ICD-10-CM | POA: Diagnosis not present

## 2020-03-25 LAB — CBC
HCT: 26.2 % — ABNORMAL LOW (ref 39.0–52.0)
Hemoglobin: 9 g/dL — ABNORMAL LOW (ref 13.0–17.0)
MCH: 34.6 pg — ABNORMAL HIGH (ref 26.0–34.0)
MCHC: 34.4 g/dL (ref 30.0–36.0)
MCV: 100.8 fL — ABNORMAL HIGH (ref 80.0–100.0)
Platelets: 166 10*3/uL (ref 150–400)
RBC: 2.6 MIL/uL — ABNORMAL LOW (ref 4.22–5.81)
RDW: 15.1 % (ref 11.5–15.5)
WBC: 9.3 10*3/uL (ref 4.0–10.5)
nRBC: 0 % (ref 0.0–0.2)

## 2020-03-25 LAB — BASIC METABOLIC PANEL
Anion gap: 9 (ref 5–15)
BUN: 33 mg/dL — ABNORMAL HIGH (ref 8–23)
CO2: 30 mmol/L (ref 22–32)
Calcium: 8.8 mg/dL — ABNORMAL LOW (ref 8.9–10.3)
Chloride: 101 mmol/L (ref 98–111)
Creatinine, Ser: 1.15 mg/dL (ref 0.61–1.24)
GFR calc Af Amer: >60 mL/min (ref >60)
GFR calc non Af Amer: >60 mL/min (ref >60)
Glucose, Bld: 142 mg/dL — ABNORMAL HIGH (ref 70–99)
Potassium: 3.1 mmol/L — ABNORMAL LOW (ref 3.5–5.1)
Sodium: 140 mmol/L (ref 135–145)

## 2020-03-25 LAB — MAGNESIUM: Magnesium: 1.9 mg/dL (ref 1.7–2.4)

## 2020-03-25 LAB — PHOSPHORUS: Phosphorus: 3.4 mg/dL (ref 2.5–4.6)

## 2020-03-25 MED ORDER — TRAMADOL HCL 50 MG PO TABS
50.0000 mg | ORAL_TABLET | Freq: Four times a day (QID) | ORAL | Status: DC | PRN
  Administered 2020-03-25 – 2020-04-05 (×6): 50 mg via ORAL
  Filled 2020-03-25 (×8): qty 1

## 2020-03-25 MED ORDER — PANTOPRAZOLE SODIUM 40 MG PO TBEC
40.0000 mg | DELAYED_RELEASE_TABLET | Freq: Two times a day (BID) | ORAL | Status: DC
  Administered 2020-03-25 – 2020-04-06 (×24): 40 mg via ORAL
  Filled 2020-03-25 (×23): qty 1

## 2020-03-25 MED ORDER — POTASSIUM CHLORIDE 10 MEQ/100ML IV SOLN
10.0000 meq | INTRAVENOUS | Status: DC
  Administered 2020-03-25: 10 meq via INTRAVENOUS
  Filled 2020-03-25 (×6): qty 100

## 2020-03-25 MED ORDER — POTASSIUM CHLORIDE 10 MEQ/100ML IV SOLN
10.0000 meq | INTRAVENOUS | Status: AC
  Administered 2020-03-25 (×4): 10 meq via INTRAVENOUS
  Filled 2020-03-25: qty 100

## 2020-03-25 NOTE — Progress Notes (Signed)
Held all of patient's oral medications-  Patient's cognitive function never improved enough to give.   Patient did become more agitated through out the day but was never alert enough to follow commands.   Will continue to monitor

## 2020-03-25 NOTE — Progress Notes (Signed)
PROGRESS NOTE    Jordan Perkins  FIE:[id number] DOB: [birth year] DOA: 03/23/2020 PCP: [name], MD    Assessment & Plan:   Principal Problem:   Acute on chronic diastolic CHF (congestive heart failure) ([place]) Active Problems:   Atrial fibrillation ([place])   Hyperlipidemia   Essential hypertension   Normocytic anemia   Stage 3a chronic kidney disease   Coronary artery disease   Fall   Hypokalemia   Elevated troponin   Acute on chronic diastolic CHF: echo on 09/15/6061 showed EF 65%. Continue on  IV lasix. Strict I/Os & daily weights. Fluid restriction. Neg fluid balance. Echo shows EF 60-65%, pulmonary HTN, unable to determine diastolic function. Will need close outpatient f/u  Likely metabolic encephalopathy: etiology unclear. Improved slightly today but only oriented to person. Rapid response called on 03/24/20 as pt would not respond other than saying "hey" and would not respond to painful stimuli on 03/24/20. CT brain shows no acute intracranial findings. Called pt's brother to discuss pt's baseline but no answer and unable to leave a message   CAD: s/p of CBAG. W/ elevated troponins, likely due to demand ischemia. No chest pain  Atrial fibrillation: likely PAF. Continue to hold metoprolol secondary to bradycardia. Hold Xarelto due to positive FOBT but H&H is trending down slightly today. Will continue to monitor   Hyperlipidemia: continue on statin   Essential hypertension: hold metoprolol due to bradycardia. IV hydralazine prn   Normocytic anemia: per ED physician, patient had a positive FOBT. Hold Xarelto. Continue on protonix.  CKDIIIa: Cr continues to trend down daily. Will continue to monitor    Fall: CT head negative. OT recs SNF. PT consulted   Hypokalemia: KCl repleted again. Mg is WNL. Will continue to monitor   Hypomagnesemia: WNL today. Will continue to monitor    DVT prophylaxis: SCDs Code Status: full  Family Communication: called pt's  brother again but no answer and unable to leave a message Disposition Plan: likely d/c to SNF   Status is: Inpatient  Remains inpatient appropriate because:Altered mental status   Dispo: The patient is from: Home              Anticipated d/c is to: SNF              Anticipated d/c date is: 3 days              Patient currently is not medically stable to d/c.     Consultants:      Procedures:    Antimicrobials:    Subjective: Pt c/o not feeling well but cannot explain any further.  Objective: Vitals:   03/24/20 1555 03/24/20 1945 03/25/20 0412 03/25/20 0720  BP: (!) 157/63 (!) 144/57 (!) 116/49 (!) 120/56  Pulse: 68 67 63 68  Resp: 17 19 20 17   Temp: 98.5 F (36.9 C) 98.4 F (36.9 C) 98.2 F (36.8 C) 98.2 F (36.8 C)  TempSrc: Oral Oral Oral Oral  SpO2: 96% 96% 91% 97%  Weight:      Height:        Intake/Output Summary (Last 24 hours) at 03/25/2020 0757 Last data filed at 03/25/2020 0723 Gross per 24 hour  Intake 0 ml  Output 3225 ml  Net -3225 ml   Filed Weights   03/23/20 0726 03/23/20 1718 03/24/20 0451  Weight: 89 kg 81.2 kg 78.9 kg    Examination:  General exam: Appears calm and comfortable  Respiratory system: decreased breath sounds b/l. No wheezes  Cardiovascular system: bradycardia. No rubs, gallops or clicks.  Gastrointestinal system: Abdomen is nondistended, soft and nontender.  Normal bowel sounds heard. Central nervous system: Awake and oriented to self only. Moves all 4 extremities  Psychiatry: Judgement and insight appear abnormal. Flat mood and affect    Data Reviewed: I have personally reviewed following labs and imaging studies  CBC: Recent Labs  Lab 03/23/20 1155 03/23/20 1733 03/23/20 2328 03/24/20 0434 03/25/20 0331  WBC 7.0 7.9 9.3 8.1 9.3  HGB 9.4* 10.3* 9.6* 9.5* 9.0*  HCT 26.9* 30.4* 26.8* 27.2* 26.2*  MCV 98.5 100.3* 97.1 98.2 100.8*  PLT 172 169 173 175 144   Basic Metabolic Panel: Recent Labs  Lab  03/23/20 0728 03/24/20 0434 03/25/20 0331  NA 135 138 140  K 3.3* 3.3* 3.1*  CL 95* 97* 101  CO2 28 29 30   GLUCOSE 141* 133* 142*  BUN 44* 36* 33*  CREATININE 1.40* 1.24 1.15  CALCIUM 9.0 9.0 8.8*  MG  --  1.6* 1.9  PHOS  --   --  3.4   GFR: Estimated Creatinine Clearance: 58.2 mL/min (by C-G formula based on SCr of 1.15 mg/dL). Liver Function Tests: Recent Labs  Lab 03/23/20 0728  AST 30  ALT 17  ALKPHOS 66  BILITOT 2.4*  PROT 8.0  ALBUMIN 3.2*   No results for input(s): LIPASE, AMYLASE in the last 168 hours. No results for input(s): AMMONIA in the last 168 hours. Coagulation Profile: Recent Labs  Lab 03/23/20 1155  INR 2.3*   Cardiac Enzymes: No results for input(s): CKTOTAL, CKMB, CKMBINDEX, TROPONINI in the last 168 hours. BNP (last 3 results) No results for input(s): PROBNP in the last 8760 hours. HbA1C: Recent Labs    03/24/20 0434  HGBA1C 5.8*   CBG: Recent Labs  Lab 03/24/20 0722 03/24/20 1001 03/24/20 1119 03/24/20 1616 03/24/20 2108  GLUCAP 109* 116* 103* 134* 132*   Lipid Profile: Recent Labs    03/24/20 0434  CHOL 75  HDL 21*  LDLCALC 46  TRIG 42  CHOLHDL 3.6   Thyroid Function Tests: No results for input(s): TSH, T4TOTAL, FREET4, T3FREE, THYROIDAB in the last 72 hours. Anemia Panel: No results for input(s): VITAMINB12, FOLATE, FERRITIN, TIBC, IRON, RETICCTPCT in the last 72 hours. Sepsis Labs: No results for input(s): PROCALCITON, LATICACIDVEN in the last 168 hours.  Recent Results (from the past 240 hour(s))  SARS Coronavirus 2 by RT PCR (hospital order, performed in [facility] hospital lab) Nasopharyngeal Nasopharyngeal Swab     Status: None   Collection Time: 03/23/20 11:55 AM   Specimen: Nasopharyngeal Swab  Result Value Ref Range Status   SARS Coronavirus 2 NEGATIVE NEGATIVE Final    Comment: (NOTE) SARS-CoV-2 target nucleic acids are NOT DETECTED.  The SARS-CoV-2 RNA is generally detectable in upper and  lower respiratory specimens during the acute phase of infection. The lowest concentration of SARS-CoV-2 viral copies this assay can detect is 250 copies / mL. A negative result does not preclude SARS-CoV-2 infection and should not be used as the sole basis for treatment or other patient management decisions.  A negative result may occur with improper specimen collection / handling, submission of specimen other than nasopharyngeal swab, presence of viral mutation(s) within the areas targeted by this assay, and inadequate number of viral copies (<250 copies / mL). A negative result must be combined with clinical observations, patient history, and epidemiological information.  Fact Sheet for Patients:   StrictlyIdeas.no  Fact Sheet for Healthcare Providers:  BankingDealers.co.za  This test is not yet approved or  cleared by the [place] and has been authorized for detection and/or diagnosis of SARS-CoV-2 by FDA under an Emergency Use Authorization (EUA).  This EUA will remain in effect (meaning this test can be used) for the duration of the COVID-19 declaration under Section 564(b)(1) of the Act, 21 U.S.C. section 360bbb-3(b)(1), unless the authorization is terminated or revoked sooner.  Performed at [facility], [address] [place], [place] [address]          Radiology Studies: CT HEAD WO CONTRAST  Result Date: 03/24/2020 CLINICAL DATA:  Altered mental status which began this morning. Admitted yesterday for weakness and recent falls. EXAM: CT HEAD WITHOUT CONTRAST TECHNIQUE: Contiguous axial images were obtained from the base of the skull through the vertex without intravenous contrast. COMPARISON:  03/23/2020 FINDINGS: Brain: No evidence of acute infarction, hemorrhage, hydrocephalus, extra-axial collection or mass lesion/mass effect. There is mild diffuse low-attenuation within the subcortical and  periventricular white matter compatible with chronic microvascular disease. Vascular: No hyperdense vessel or unexpected calcification. Skull: Normal. Negative for fracture or focal lesion. Sinuses/Orbits: No acute finding. Other: None. IMPRESSION: 1. No acute intracranial abnormalities. 2. Chronic small vessel ischemic change and brain atrophy. Electronically Signed   By: Kerby Moors M.D.   On: 03/24/2020 10:42   CT Head Wo Contrast  Result Date: 03/23/2020 CLINICAL DATA:  Multiple falls EXAM: CT HEAD WITHOUT CONTRAST TECHNIQUE: Contiguous axial images were obtained from the base of the skull through the vertex without intravenous contrast. COMPARISON:  02/26/2020 FINDINGS: Brain: There is no acute intracranial hemorrhage, mass effect, or edema. Gray-white differentiation is preserved. There is no extra-axial fluid collection. Ventricles and sulci are stable in size and configuration. Patchy hypoattenuation in the supratentorial white matter is nonspecific but may reflect mild chronic microvascular ischemic changes. Vascular: There is atherosclerotic calcification at the skull base. Skull: Calvarium is unremarkable. Sinuses/Orbits: Right posterior ethmoid opacification. Chronic deformity of the medial wall the right orbit. Other: Mastoid air cells are clear. IMPRESSION: No evidence of acute intracranial injury. No significant change since recent prior study. Electronically Signed   By: Macy Mis M.D.   On: 03/23/2020 09:52   CT ABDOMEN PELVIS W CONTRAST  Result Date: 03/23/2020 CLINICAL DATA:  Abdominal trauma, right flank hematoma, fall 1 week ago, hemoglobin drop, evaluate for retroperitoneal hematoma EXAM: CT ABDOMEN AND PELVIS WITH CONTRAST TECHNIQUE: Multidetector CT imaging of the abdomen and pelvis was performed using the standard protocol following bolus administration of intravenous contrast. CONTRAST:  71mL OMNIPAQUE IOHEXOL 300 MG/ML  SOLN COMPARISON:  06/11/2013 FINDINGS: Lower chest:  Small right pleural effusion. Bibasilar atelectasis and/or scarring. Coronary artery calcifications. Hepatobiliary: Coarse, nodular contour of the liver. Status post cholecystectomy. No biliary dilatation. Pancreas: Unremarkable. No pancreatic ductal dilatation or surrounding inflammatory changes. Spleen: Normal in size without significant abnormality. Adrenals/Urinary Tract: Adrenal glands are unremarkable. Kidneys are normal, without renal calculi, solid lesion, or hydronephrosis. Foley catheter in the urinary bladder. Stomach/Bowel: Stomach is within normal limits. Appendix appears normal. No evidence of bowel wall thickening, distention, or inflammatory changes. Vascular/Lymphatic: Aortic atherosclerosis. Prominent, nonspecific bilateral inguinal and iliac lymph nodes. Reproductive: No mass or other significant abnormality. Other: Anasarca. Large volume fluid attenuation ascites throughout the abdomen and pelvis. Musculoskeletal: No acute or significant osseous findings. Nonacute fracture of the left transverse process of L3 (series 3, image 39). IMPRESSION: 1. No specific evidence of acute traumatic injury to the abdomen or pelvis. No  evidence of retroperitoneal hematoma. 2. Large volume fluid attenuation ascites throughout the abdomen and pelvis. Hemoperitoneum superimposed upon ascites is not strictly excluded; consider diagnostic paracentesis if there is high concern for hemorrhage in the setting of abdominal trauma. 3. Small right pleural effusion without obvious included rib fracture. 4. Coarse, nodular contour of the liver, suggestive of cirrhosis. 5. Prominent, nonspecific bilateral inguinal and iliac lymph nodes, likely reactive. 6. Anasarca. 7. Coronary artery disease.  Aortic Atherosclerosis (ICD10-I70.0). Electronically Signed   By: Eddie Candle M.D.   On: 03/23/2020 09:57   DG Chest Portable 1 View  Result Date: 03/23/2020 CLINICAL DATA:  Weakness EXAM: PORTABLE CHEST 1 VIEW COMPARISON:   02/26/2020 FINDINGS: Persistent right pleural effusion with associated atelectasis. Pulmonary vascular congestion likely superimposed on chronic interstitial prominence. No pneumothorax. Stable cardiomediastinal IMPRESSION: Persistent small right pleural effusion and right basilar atelectasis. Pulmonary vascular congestion. Electronically Signed   By: Macy Mis M.D.   On: 03/23/2020 08:35   ECHOCARDIOGRAM COMPLETE  Result Date: 03/24/2020    ECHOCARDIOGRAM REPORT   Patient Name:   DIYARI CHERNE Date of Exam: 03/23/2020 Medical Rec #:  [id number]           Height:       71.0 in Accession #:    [id number]          Weight:       196.2 lb Date of Birth:  [birth year]          BSA:          2.091 m Patient Age:    79 years            BP:           157/57 mmHg Patient Gender: M                   HR:           59 bpm. Exam Location:  [facility] Procedure: 2D Echo, Cardiac Doppler and Color Doppler Indications:     G81.85 Acute Diastolic Heart Failure.  History:         Patient has prior history of Echocardiogram examinations, most                  recent 02/17/2019. Risk Factors:Hypertension and Dyslipidemia.                  Pleural effusion. Atrial Fibrillation. Coronary artery disease.                  Heart failure.  Sonographer:     Wilford Sports Rodgers-Jones Referring Phys:  [place] Diagnosing Phys: Kate Sable MD IMPRESSIONS  1. Left ventricular ejection fraction, by estimation, is 60 to 65%. The left ventricle has normal function. The left ventricle demonstrates global hypokinesis. Left ventricular diastolic parameters are indeterminate.  2. Right ventricular systolic function is mildly reduced. The right ventricular size is mildly enlarged. There is severely elevated pulmonary artery systolic pressure. The estimated right ventricular systolic pressure is 631.4 mmHg.  3. Left atrial size was mildly dilated.  4. Right atrial size was mildly dilated.  5. The mitral valve is grossly normal. Mild mitral  valve regurgitation.  6. The aortic valve is tricuspid. Aortic valve regurgitation is trivial. Mild aortic valve sclerosis is present, with no evidence of aortic valve stenosis.  7. The inferior vena cava is dilated in size with <50% respiratory variability, suggesting right atrial pressure of 15 mmHg. FINDINGS  Left Ventricle: Left  ventricular ejection fraction, by estimation, is 60 to 65%. The left ventricle has normal function. The left ventricle demonstrates global hypokinesis. The left ventricular internal cavity size was normal in size. There is borderline left ventricular hypertrophy. Left ventricular diastolic parameters are indeterminate. Right Ventricle: The right ventricular size is mildly enlarged. No increase in right ventricular wall thickness. Right ventricular systolic function is mildly reduced. There is severely elevated pulmonary artery systolic pressure. The tricuspid regurgitant velocity is 4.75 m/s, and with an assumed right atrial pressure of 15 mmHg, the estimated right ventricular systolic pressure is 322.0 mmHg. Left Atrium: Left atrial size was mildly dilated. Right Atrium: Right atrial size was mildly dilated. Pericardium: There is no evidence of pericardial effusion. Mitral Valve: The mitral valve is grossly normal. There is mild calcification of the anterior mitral valve leaflet(s). Mild mitral valve regurgitation. Tricuspid Valve: The tricuspid valve is normal in structure. Tricuspid valve regurgitation is mild. Aortic Valve: The aortic valve is tricuspid. Aortic valve regurgitation is trivial. Mild aortic valve sclerosis is present, with no evidence of aortic valve stenosis. Pulmonic Valve: The pulmonic valve was normal in structure. Pulmonic valve regurgitation is not visualized. Aorta: The aortic root is normal in size and structure. Venous: The inferior vena cava is dilated in size with less than 50% respiratory variability, suggesting right atrial pressure of 15 mmHg. IAS/Shunts:  No atrial level shunt detected by color flow Doppler.  LEFT VENTRICLE PLAX 2D LVIDd:         4.44 cm LVIDs:         3.21 cm LV PW:         1.05 cm LV IVS:        1.05 cm LVOT diam:     2.10 cm LV SV:         85 LV SV Index:   41 LVOT Area:     3.46 cm  RIGHT VENTRICLE            IVC RV Basal diam:  4.42 cm    IVC diam: 2.23 cm RV S prime:     9.26 cm/s TAPSE (M-mode): 1.0 cm LEFT ATRIUM             Index       RIGHT ATRIUM           Index LA diam:        5.30 cm 2.53 cm/m  RA Area:     18.40 cm LA Vol (A2C):   80.2 ml 38.35 ml/m RA Volume:   54.30 ml  25.96 ml/m LA Vol (A4C):   71.7 ml 34.28 ml/m LA Biplane Vol: 77.5 ml 37.06 ml/m  AORTIC VALVE LVOT Vmax:   78.30 cm/s LVOT Vmean:  64.700 cm/s LVOT VTI:    0.245 m  AORTA Ao Root diam: 3.50 cm Ao Asc diam:  3.80 cm MV E velocity: 144.50 cm/s  TRICUSPID VALVE                             TR Peak grad:   90.2 mmHg                             TR Vmax:        475.00 cm/s  SHUNTS                             Systemic VTI:  0.24 m                             Systemic Diam: 2.10 cm Kate Sable MD Electronically signed by Kate Sable MD Signature Date/Time: 03/24/2020/1:39:09 PM    Final         Scheduled Meds: . atorvastatin  80 mg Oral Daily  . Chlorhexidine Gluconate Cloth  6 each Topical Daily  . furosemide  40 mg Intravenous Q12H  . lisinopril  40 mg Oral Daily  . pantoprazole (PROTONIX) IV  40 mg Intravenous Q12H  . potassium chloride  40 mEq Oral Once  . sodium chloride flush  3 mL Intravenous Q12H   Continuous Infusions: . sodium chloride       LOS: 2 days    Time spent: 30 mins     Wyvonnia Dusky, MD [facility] Pager [phone]  If 7PM-7AM, please contact night-coverage [email or web address] 03/25/2020, 7:57 AM

## 2020-03-25 NOTE — Evaluation (Signed)
Physical Therapy Evaluation Patient Details Name: Jordan Perkins MRN: [id number] DOB: [birth year] Today's Date: 03/25/2020   History of Present Illness  Jordan Perkins is a 79 y.o. male with medical history significant of hypertension, hyperlipidemia, atrial fibrillation on Xarelto, PAH, CAD, CABG, dCHF, anemia, PAD, gallstone pancreatitis, CKD-3, possible dementia, who presents with multiple falls, generalized weakness and leg edema; admitted for management of acute/chronic CHF  Clinical Impression  Upon evaluation, patient alert and oriented to self only; follows simple commands, pleasant and cooperative. Very limited ability to carry-over new cuing/education noted during session. Bilat UE/LE generally weak and deconditioned, but no focal weakness appreciated.  Able to complete bed mobility with close sup; sit/stand, basic transfers and short-distance gait (5') with RW, min assist.  Demonstrates forward flexed posture with very short, shuffling gait performance.  Bilat knees slightly tremulous, but no overt buckling noted.  Poor balance reactions, poor activity tolerance. Unsafe to attempt without RW and +1 assist at all times. Vitals stable and WFL throughout session; tolerates transition to upright and mild activity without notable change in vitals. Would benefit from skilled PT to address above deficits and promote optimal return to PLOF.; recommend transition to STR upon discharge from acute hospitalization. If care needs unable to be managed in home environment post-rehab, may be appropriate for transition to LTC.     Follow Up Recommendations SNF    Equipment Recommendations  Rolling walker with 5" wheels;3in1 (PT)    Recommendations for Other Services       Precautions / Restrictions Precautions Precautions: Fall Restrictions Weight Bearing Restrictions: No      Mobility  Bed Mobility Overal bed mobility: Needs Assistance Bed Mobility: Supine to Sit;Sit to Supine      Supine to sit: Supervision Sit to supine: Supervision      Transfers Overall transfer level: Needs assistance Equipment used: Rolling walker (2 wheeled) Transfers: Sit to/from Stand Sit to Stand: Min assist         General transfer comment: tends to stand somewhat automatically once walker placed in front of patient.  Relies heavily on UEs pulling up on RW; limited recall of cuing/education otherwise  Ambulation/Gait Ambulation/Gait assistance: Min assist Gait Distance (Feet): 10 Feet Assistive device: Rolling walker (2 wheeled)       General Gait Details: forward flexed posture with very short, shuffling gait performance.  Bilat knees slightly tremulous, but no overt buckling noted.  Poor balance reactions, poor activity tolerance. Unasfe to attempt without RW and +1 assist at all times.  Stairs            Wheelchair Mobility    Modified Rankin (Stroke Patients Only)       Balance Overall balance assessment: Needs assistance Sitting-balance support: No upper extremity supported;Feet supported Sitting balance-Leahy Scale: Good     Standing balance support: Bilateral upper extremity supported Standing balance-Leahy Scale: Poor                               Pertinent Vitals/Pain Pain Assessment: Faces Faces Pain Scale: No hurt    Home Living Family/patient expects to be discharged to:: Private residence Living Arrangements:  (brother?) Available Help at Discharge: Family           Home Equipment: Gilford Rile - 2 wheels Additional Comments: Patient suggests living with brother; questionable historian.  Will verify with family as available.    Prior Function  Comments: Patient questionable historian.  Will verify with family as available.     Hand Dominance        Extremity/Trunk Assessment   Upper Extremity Assessment Upper Extremity Assessment: Generalized weakness (grossly at least 3/5 throughout)    Lower  Extremity Assessment Lower Extremity Assessment: Generalized weakness (grossly at least 4-/5 throughout)       Communication   Communication: No difficulties  Cognition Arousal/Alertness: Awake/alert Behavior During Therapy: Flat affect Overall Cognitive Status: No family/caregiver present to determine baseline cognitive functioning                                 General Comments: oriented to self only; verbalizes being in [place]. Unable to identify location, reason for admission, time or date.  Does follow simple commands, but often requires repetition for recall to task and to new information.      General Comments      Exercises Other Exercises Other Exercises: Sit/stand x3 with RW, min assist; limited carry-over of cuing/education between trials. Other Exercises: Orthostatic assessment (see vitals flowsheet); HR and BP stable and WFL with transition to upright.   Assessment/Plan    PT Assessment Patient needs continued PT services  PT Problem List Decreased strength;Decreased range of motion;Decreased activity tolerance;Decreased balance;Decreased mobility;Decreased coordination;Decreased knowledge of use of DME;Decreased cognition;Decreased safety awareness;Decreased knowledge of precautions       PT Treatment Interventions DME instruction;Gait training;Stair training;Functional mobility training;Therapeutic activities;Therapeutic exercise;Balance training;Neuromuscular re-education;Cognitive remediation;Patient/family education    PT Goals (Current goals can be found in the Care Plan section)  Acute Rehab PT Goals PT Goal Formulation: Patient unable to participate in goal setting Time For Goal Achievement: 04/08/20 Potential to Achieve Goals: Fair    Frequency Min 2X/week   Barriers to discharge Decreased caregiver support      Co-evaluation               AM-PAC PT "6 Clicks" Mobility  Outcome Measure Help needed turning from your back to  your side while in a flat bed without using bedrails?: None Help needed moving from lying on your back to sitting on the side of a flat bed without using bedrails?: None Help needed moving to and from a bed to a chair (including a wheelchair)?: A Little Help needed standing up from a chair using your arms (e.g., wheelchair or bedside chair)?: A Little Help needed to walk in hospital room?: A Little Help needed climbing 3-5 steps with a railing? : A Little 6 Click Score: 20    End of Session Equipment Utilized During Treatment: Gait belt Activity Tolerance: Patient tolerated treatment well Patient left: in bed;with call bell/phone within reach;with bed alarm set Nurse Communication: Mobility status PT Visit Diagnosis: Muscle weakness (generalized) (M62.81);Difficulty in walking, not elsewhere classified (R26.2)    Time: 5456-2563 PT Time Calculation (min) (ACUTE ONLY): 20 min   Charges:   PT Evaluation $PT Eval Moderate Complexity: 1 Mod PT Treatments $Therapeutic Activity: 8-22 mins        Jordan Perkins, PT, DPT, NCS 03/25/20, 4:39 PM [phone]

## 2020-03-25 NOTE — Evaluation (Signed)
Occupational Therapy Evaluation Patient Details Name: Jordan Perkins MRN: [id number] DOB: 07-Jan-1941 Today's Date: 03/25/2020    History of Present Illness  Jordan Perkins is a 79 y.o. male with medical history significant of hypertension, hyperlipidemia, atrial fibrillation on Xarelto, PAH, CAD, CABG, dCHF, anemia, PAD, gallstone pancreatitis, CKD-3, possible dementia, who presents with multiple falls, generalized weakness and leg edema.   Clinical Impression   Jordan Perkins was seen for OT evaluation this date. Pt oriented to self and location only - unable to provide PLOF or home setup this date. Pt reports lives in mobile home c brother, unclear reliability. Pt presents to acute OT demonstrating impaired ADL performance, functional cognition, and functional mobility 2/2 decreased command following, functional strength/balance/coordination deficits, poor safety awareness, and decreased activity tolerance. Pt currently requires  SETUP + MIN VCs simulated self-feeding seated EOB. MAX A don B socks seated EOB. MOD A + RW foro ADL t/f.  Pt would benefit from skilled OT to address noted impairments and functional limitations (see below for any additional details) in order to maximize safety and independence while minimizing falls risk and caregiver burden. Upon hospital discharge, recommend STR to maximize pt safety and return to PLOF.     Follow Up Recommendations  SNF    Equipment Recommendations   (TBD next venue of care)    Recommendations for Other Services       Precautions / Restrictions Precautions Precautions: Fall Restrictions Weight Bearing Restrictions: No      Mobility Bed Mobility Overal bed mobility: Needs Assistance Bed Mobility: Supine to Sit;Sit to Supine     Supine to sit: Supervision Sit to supine: Supervision      Transfers Overall transfer level: Needs assistance Equipment used: Rolling walker (2 wheeled) Transfers: Sit to/from Stand Sit to Stand:  Mod assist         General transfer comment: physical assist required and assist to place LUE on RW - intention tremor noted     Balance Overall balance assessment: Needs assistance Sitting-balance support: Single extremity supported;Feet supported Sitting balance-Leahy Scale: Good     Standing balance support: Bilateral upper extremity supported Standing balance-Leahy Scale: Poor                             ADL either performed or assessed with clinical judgement   ADL Overall ADL's : Needs assistance/impaired                                       General ADL Comments: SETUP + MIN VCs simulated self-feeding seated EOB. MAX A don B socks seated EOB. MOD A + RW for ADL t/f      Vision         Perception     Praxis      Pertinent Vitals/Pain Pain Assessment: No/denies pain     Hand Dominance     Extremity/Trunk Assessment Upper Extremity Assessment Upper Extremity Assessment: Generalized weakness;LUE deficits/detail;RUE deficits/detail (Unable to achieve 5 finger oppsotion or hold socks in hand ) RUE Deficits / Details: Grossly 3/5. AROM shoulder flexion limited ~85*. Unable to achieve 5 finger opposition. Cylindrical grasp to hold bottled water. Uses transverse palmar grasp to hold spoon for feeding.  LUE Deficits / Details: Grossly 3/5. AROM shoulder flexion limited ~85*. Unable to achieve 5 finger opposition.    Lower Extremity Assessment Lower  Extremity Assessment: Generalized weakness       Communication Communication Communication: No difficulties   Cognition Arousal/Alertness: Awake/alert;Lethargic Behavior During Therapy: Flat affect Overall Cognitive Status: No family/caregiver present to determine baseline cognitive functioning                                 General Comments: Pt oriented to self only and location (states city as [place] but knows he is in a hospital and name of hospital as [facility]).  Unable to state time and decreasingly responsive to logical cues. Pt states he does not know why he is here.    General Comments  SpO2 97% on RA seated EOB     Exercises Exercises: Other exercises Other Exercises Other Exercises: Pt educated re: OT role, oriented to time/situation, RW technique Other Exercises: LBD, self-feeding, sup<>sit, sit<>stand, sitting/standing balance/tolerance   Shoulder Instructions      Home Living Family/patient expects to be discharged to:: Private residence Living Arrangements: Other relatives (pt reports brother and mother then restates only brother- poor historian )                                      Prior Functioning/Environment Level of Independence: Needs assistance                 OT Problem List: Decreased strength;Decreased range of motion;Decreased activity tolerance;Impaired balance (sitting and/or standing);Decreased coordination;Impaired UE functional use      OT Treatment/Interventions: Self-care/ADL training;Therapeutic exercise;Energy conservation;DME and/or AE instruction;Therapeutic activities;Patient/family education;Balance training    OT Goals(Current goals can be found in the care plan section) Acute Rehab OT Goals Patient Stated Goal: To return home OT Goal Formulation: With patient Time For Goal Achievement: 04/08/20 Potential to Achieve Goals: Fair ADL Goals Pt Will Perform Eating: with set-up;with supervision;sitting Pt Will Perform Lower Body Dressing: with min assist;sitting/lateral leans Pt Will Transfer to Toilet: with min assist;stand pivot transfer;bedside commode (c LRAD PRN)  OT Frequency: Min 1X/week   Barriers to D/C: Inaccessible home environment;Decreased caregiver support          Co-evaluation              AM-PAC OT "6 Clicks" Daily Activity     Outcome Measure Help from another person eating meals?: A Little Help from another person taking care of personal grooming?: A  Little Help from another person toileting, which includes using toliet, bedpan, or urinal?: A Lot Help from another person bathing (including washing, rinsing, drying)?: A Lot Help from another person to put on and taking off regular upper body clothing?: A Little Help from another person to put on and taking off regular lower body clothing?: A Lot 6 Click Score: 15   End of Session Equipment Utilized During Treatment: Rolling walker Nurse Communication: Mobility status  Activity Tolerance: Patient tolerated treatment well Patient left: in bed;with call bell/phone within reach;with bed alarm set  OT Visit Diagnosis: Other abnormalities of gait and mobility (R26.89);Unsteadiness on feet (R26.81);History of falling (Z91.81)                Time: 3790-2409 OT Time Calculation (min): 20 min Charges:  OT General Charges $OT Visit: 1 Visit OT Evaluation $OT Eval Moderate Complexity: 1 Mod OT Treatments $Self Care/Home Management : 8-22 mins  Dessie Coma, M.S. OTR/L  03/25/20, 1:01 PM

## 2020-03-26 ENCOUNTER — Other Ambulatory Visit: Payer: Self-pay | Admitting: Cardiovascular Disease

## 2020-03-26 DIAGNOSIS — I5033 Acute on chronic diastolic (congestive) heart failure: Secondary | ICD-10-CM | POA: Diagnosis not present

## 2020-03-26 DIAGNOSIS — G9341 Metabolic encephalopathy: Secondary | ICD-10-CM | POA: Diagnosis not present

## 2020-03-26 DIAGNOSIS — E876 Hypokalemia: Secondary | ICD-10-CM | POA: Diagnosis not present

## 2020-03-26 LAB — CBC
HCT: 24.5 % — ABNORMAL LOW (ref 39.0–52.0)
Hemoglobin: 8.6 g/dL — ABNORMAL LOW (ref 13.0–17.0)
MCH: 34.8 pg — ABNORMAL HIGH (ref 26.0–34.0)
MCHC: 35.1 g/dL (ref 30.0–36.0)
MCV: 99.2 fL (ref 80.0–100.0)
Platelets: 143 10*3/uL — ABNORMAL LOW (ref 150–400)
RBC: 2.47 MIL/uL — ABNORMAL LOW (ref 4.22–5.81)
RDW: 15.7 % — ABNORMAL HIGH (ref 11.5–15.5)
WBC: 11.5 10*3/uL — ABNORMAL HIGH (ref 4.0–10.5)
nRBC: 0 % (ref 0.0–0.2)

## 2020-03-26 LAB — PHOSPHORUS: Phosphorus: 2.7 mg/dL (ref 2.5–4.6)

## 2020-03-26 LAB — BASIC METABOLIC PANEL
Anion gap: 10 (ref 5–15)
BUN: 27 mg/dL — ABNORMAL HIGH (ref 8–23)
CO2: 29 mmol/L (ref 22–32)
Calcium: 8.4 mg/dL — ABNORMAL LOW (ref 8.9–10.3)
Chloride: 97 mmol/L — ABNORMAL LOW (ref 98–111)
Creatinine, Ser: 1.11 mg/dL (ref 0.61–1.24)
GFR calc Af Amer: >60 mL/min (ref >60)
GFR calc non Af Amer: >60 mL/min (ref >60)
Glucose, Bld: 133 mg/dL — ABNORMAL HIGH (ref 70–99)
Potassium: 2.8 mmol/L — ABNORMAL LOW (ref 3.5–5.1)
Sodium: 136 mmol/L (ref 135–145)

## 2020-03-26 LAB — MAGNESIUM: Magnesium: 1.8 mg/dL (ref 1.7–2.4)

## 2020-03-26 MED ORDER — POTASSIUM CHLORIDE CRYS ER 20 MEQ PO TBCR
40.0000 meq | EXTENDED_RELEASE_TABLET | Freq: Three times a day (TID) | ORAL | Status: AC
  Administered 2020-03-26 (×3): 40 meq via ORAL
  Filled 2020-03-26 (×3): qty 2

## 2020-03-26 MED ORDER — FUROSEMIDE 10 MG/ML IJ SOLN: 40.0000 mg | Freq: Every day | INTRAMUSCULAR | Status: DC

## 2020-03-26 NOTE — TOC Transition Note (Signed)
Transition of Care [facility]) - CM/SW Discharge Note   Patient Details  Name: Keilan Nichol MRN: [id number] Date of Birth: September 26, 1940  [facility] of [facility]) CM/SW Contact:  Meriel Flavors, LCSW Phone Number: 03/26/2020, 9:18 AM   Clinical Narrative:    CSW contacted patient's brother, Delvonte Berenson to discuss discharge plan. Ron stated his brother lives with him and it is fine for him to come back home. Ron reports the neighbor is also available to assist patient as needed. Patient continues to request going home at discharge, refusing SNF. Ron will transport patient home at discharge.         Patient Goals and CMS Choice        Discharge Placement                       Discharge Plan and Services                                     Social Determinants of Health (SDOH) Interventions     Readmission Risk Interventions No flowsheet data found.

## 2020-03-26 NOTE — Progress Notes (Signed)
CCMD called reported that patient's HR briefly 38 bpm. Went to patient's room immediately and  Heart rate on telemetry shows at 72 bpm. Patient alert and asymptomatic.

## 2020-03-26 NOTE — Progress Notes (Signed)
PROGRESS NOTE    Jordan Perkins  TKW:[id number] DOB: 30-Sep-1940 DOA: 03/23/2020 PCP: [name], MD    Assessment & Plan:   Principal Problem:   Acute on chronic diastolic CHF (congestive heart failure) ([place]) Active Problems:   Atrial fibrillation ([place])   Hyperlipidemia   Essential hypertension   Normocytic anemia   Stage 3a chronic kidney disease   Coronary artery disease   Fall   Hypokalemia   Elevated troponin   Acute on chronic diastolic CHF: echo on 05/13/4267 showed EF 65%. Continue on  IV lasix. Strict I/Os & daily weights. Fluid restriction. Neg fluid balance again today. Echo shows EF 60-65%, pulmonary HTN, unable to determine diastolic function. Will need close outpatient f/u  Likely metabolic encephalopathy: etiology unclear. Improved today bu still only oriented to person. Rapid response called on 03/24/20 as pt would not respond other than saying "hey" and would not respond to painful stimuli on 03/24/20. CT brain shows no acute intracranial findings. Called pt's brother to discuss pt's baseline but no answer and unable to leave a message   CAD: s/p of CBAG. W/ elevated troponins, likely due to demand ischemia. No chest pain  Atrial fibrillation: likely PAF. Continue to hold metoprolol secondary to bradycardia. Hold Xarelto due to positive FOBT but H&H continues to trend down. Transfuse if Hb < 7. Will continue to monitor   Hyperlipidemia: continue on statin   Essential hypertension: hold metoprolol due to bradycardia. IV hydralazine prn   Normocytic anemia: per ED physician, patient had a positive FOBT. Hold Xarelto. Continue on protonix.  CKDIIIa: Cr is stable. Will continue to monitor    Fall: CT head negative. OT recs SNF. PT consulted   Hypokalemia: KCl repleted again. Mg is WNL. Will continue to monitor   Hypomagnesemia: WNL today. Will continue to monitor   Thrombocytopenia: etiology unclear. Will continue to monitor    DVT  prophylaxis: SCDs Code Status: full  Family Communication: called pt's brother again each day but no answer and unable to leave a message Disposition Plan: likely d/c to SNF   Status is: Inpatient  Remains inpatient appropriate because:Altered mental status   Dispo: The patient is from: Home              Anticipated d/c is to: SNF              Anticipated d/c date is: 3 days              Patient currently is not medically stable to d/c.     Consultants:      Procedures:    Antimicrobials:    Subjective: Pt c/o not being able to feed himself and he needs some help.  Objective: Vitals:   03/25/20 1523 03/25/20 2050 03/26/20 0351 03/26/20 0743  BP: (!) 140/53 (!) 115/56 (!) 130/47 134/63  Pulse: (!) 58 72 64 60  Resp: 16 20 20 17   Temp: 97.8 F (36.6 C) 98.6 F (37 C) 98.2 F (36.8 C) 98.5 F (36.9 C)  TempSrc: Oral     SpO2: 99% 96% 96% 98%  Weight:      Height:        Intake/Output Summary (Last 24 hours) at 03/26/2020 0815 Last data filed at 03/25/2020 2252 Gross per 24 hour  Intake --  Output 1850 ml  Net -1850 ml   Filed Weights   03/23/20 0726 03/23/20 1718 03/24/20 0451  Weight: 89 kg 81.2 kg 78.9 kg    Examination:  General exam: Appears calm and comfortable  Respiratory system: diminished breath sounds b/l. No rales Cardiovascular system: bradycardia. No rubs, gallops or clicks.  Gastrointestinal system: Abdomen is nondistended, soft and nontender.  Hypoactive bowel sounds heard. Central nervous system: Awake and oriented to self only. Moves all 4 extremities  Psychiatry: Judgement and insight appear abnormal. Flat mood and affect    Data Reviewed: I have personally reviewed following labs and imaging studies  CBC: Recent Labs  Lab 03/23/20 1733 03/23/20 2328 03/24/20 0434 03/25/20 0331 03/26/20 0352  WBC 7.9 9.3 8.1 9.3 11.5*  HGB 10.3* 9.6* 9.5* 9.0* 8.6*  HCT 30.4* 26.8* 27.2* 26.2* 24.5*  MCV 100.3* 97.1 98.2 100.8* 99.2   PLT 169 173 175 166 322*   Basic Metabolic Panel: Recent Labs  Lab 03/23/20 0728 03/24/20 0434 03/25/20 0331 03/26/20 0352  NA 135 138 140 136  K 3.3* 3.3* 3.1* 2.8*  CL 95* 97* 101 97*  CO2 28 29 30 29   GLUCOSE 141* 133* 142* 133*  BUN 44* 36* 33* 27*  CREATININE 1.40* 1.24 1.15 1.11  CALCIUM 9.0 9.0 8.8* 8.4*  MG  --  1.6* 1.9 1.8  PHOS  --   --  3.4 2.7   GFR: Estimated Creatinine Clearance: 60.3 mL/min (by C-G formula based on SCr of 1.11 mg/dL). Liver Function Tests: Recent Labs  Lab 03/23/20 0728  AST 30  ALT 17  ALKPHOS 66  BILITOT 2.4*  PROT 8.0  ALBUMIN 3.2*   No results for input(s): LIPASE, AMYLASE in the last 168 hours. No results for input(s): AMMONIA in the last 168 hours. Coagulation Profile: Recent Labs  Lab 03/23/20 1155  INR 2.3*   Cardiac Enzymes: No results for input(s): CKTOTAL, CKMB, CKMBINDEX, TROPONINI in the last 168 hours. BNP (last 3 results) No results for input(s): PROBNP in the last 8760 hours. HbA1C: Recent Labs    03/24/20 0434  HGBA1C 5.8*   CBG: Recent Labs  Lab 03/24/20 0722 03/24/20 1001 03/24/20 1119 03/24/20 1616 03/24/20 2108  GLUCAP 109* 116* 103* 134* 132*   Lipid Profile: Recent Labs    03/24/20 0434  CHOL 75  HDL 21*  LDLCALC 46  TRIG 42  CHOLHDL 3.6   Thyroid Function Tests: No results for input(s): TSH, T4TOTAL, FREET4, T3FREE, THYROIDAB in the last 72 hours. Anemia Panel: No results for input(s): VITAMINB12, FOLATE, FERRITIN, TIBC, IRON, RETICCTPCT in the last 72 hours. Sepsis Labs: No results for input(s): PROCALCITON, LATICACIDVEN in the last 168 hours.  Recent Results (from the past 240 hour(s))  SARS Coronavirus 2 by RT PCR (hospital order, performed in [facility] hospital lab) Nasopharyngeal Nasopharyngeal Swab     Status: None   Collection Time: 03/23/20 11:55 AM   Specimen: Nasopharyngeal Swab  Result Value Ref Range Status   SARS Coronavirus 2 NEGATIVE NEGATIVE Final     Comment: (NOTE) SARS-CoV-2 target nucleic acids are NOT DETECTED.  The SARS-CoV-2 RNA is generally detectable in upper and lower respiratory specimens during the acute phase of infection. The lowest concentration of SARS-CoV-2 viral copies this assay can detect is 250 copies / mL. A negative result does not preclude SARS-CoV-2 infection and should not be used as the sole basis for treatment or other patient management decisions.  A negative result may occur with improper specimen collection / handling, submission of specimen other than nasopharyngeal swab, presence of viral mutation(s) within the areas targeted by this assay, and inadequate number of viral copies (<250 copies / mL). A  negative result must be combined with clinical observations, patient history, and epidemiological information.  Fact Sheet for Patients:   StrictlyIdeas.no  Fact Sheet for Healthcare Providers: BankingDealers.co.za  This test is not yet approved or  cleared by the [place] FDA and has been authorized for detection and/or diagnosis of SARS-CoV-2 by FDA under an Emergency Use Authorization (EUA).  This EUA will remain in effect (meaning this test can be used) for the duration of the COVID-19 declaration under Section 564(b)(1) of the Act, 21 U.S.C. section 360bbb-3(b)(1), unless the authorization is terminated or revoked sooner.  Performed at [facility], [address], [place], [place] [address]          Radiology Studies: CT HEAD WO CONTRAST  Result Date: 03/24/2020 CLINICAL DATA:  Altered mental status which began this morning. Admitted yesterday for weakness and recent falls. EXAM: CT HEAD WITHOUT CONTRAST TECHNIQUE: Contiguous axial images were obtained from the base of the skull through the vertex without intravenous contrast. COMPARISON:  03/23/2020 FINDINGS: Brain: No evidence of acute infarction, hemorrhage, hydrocephalus,  extra-axial collection or mass lesion/mass effect. There is mild diffuse low-attenuation within the subcortical and periventricular white matter compatible with chronic microvascular disease. Vascular: No hyperdense vessel or unexpected calcification. Skull: Normal. Negative for fracture or focal lesion. Sinuses/Orbits: No acute finding. Other: None. IMPRESSION: 1. No acute intracranial abnormalities. 2. Chronic small vessel ischemic change and brain atrophy. Electronically Signed   By: Kerby Moors M.D.   On: 03/24/2020 10:42        Scheduled Meds:  atorvastatin  80 mg Oral Daily   Chlorhexidine Gluconate Cloth  6 each Topical Daily   furosemide  40 mg Intravenous Q12H   lisinopril  40 mg Oral Daily   pantoprazole  40 mg Oral BID   sodium chloride flush  3 mL Intravenous Q12H   Continuous Infusions:  sodium chloride       LOS: 3 days    Time spent: 32 mins     Wyvonnia Dusky, MD [facility] Pager [phone]  If 7PM-7AM, please contact night-coverage [email or web address] 03/26/2020, 8:15 AM

## 2020-03-27 DIAGNOSIS — I480 Paroxysmal atrial fibrillation: Secondary | ICD-10-CM

## 2020-03-27 LAB — MAGNESIUM: Magnesium: 1.6 mg/dL — ABNORMAL LOW (ref 1.7–2.4)

## 2020-03-27 LAB — BASIC METABOLIC PANEL
Anion gap: 8 (ref 5–15)
BUN: 27 mg/dL — ABNORMAL HIGH (ref 8–23)
CO2: 28 mmol/L (ref 22–32)
Calcium: 7.9 mg/dL — ABNORMAL LOW (ref 8.9–10.3)
Chloride: 100 mmol/L (ref 98–111)
Creatinine, Ser: 1.23 mg/dL (ref 0.61–1.24)
GFR calc Af Amer: >60 mL/min (ref >60)
GFR calc non Af Amer: 57 mL/min — ABNORMAL LOW (ref >60)
Glucose, Bld: 155 mg/dL — ABNORMAL HIGH (ref 70–99)
Potassium: 3.9 mmol/L (ref 3.5–5.1)
Sodium: 136 mmol/L (ref 135–145)

## 2020-03-27 LAB — CBC
HCT: 24.5 % — ABNORMAL LOW (ref 39.0–52.0)
Hemoglobin: 8.5 g/dL — ABNORMAL LOW (ref 13.0–17.0)
MCH: 35.3 pg — ABNORMAL HIGH (ref 26.0–34.0)
MCHC: 34.7 g/dL (ref 30.0–36.0)
MCV: 101.7 fL — ABNORMAL HIGH (ref 80.0–100.0)
Platelets: 142 10*3/uL — ABNORMAL LOW (ref 150–400)
RBC: 2.41 MIL/uL — ABNORMAL LOW (ref 4.22–5.81)
RDW: 15.6 % — ABNORMAL HIGH (ref 11.5–15.5)
WBC: 9.5 10*3/uL (ref 4.0–10.5)
nRBC: 0 % (ref 0.0–0.2)

## 2020-03-27 LAB — PHOSPHORUS: Phosphorus: 1.9 mg/dL — ABNORMAL LOW (ref 2.5–4.6)

## 2020-03-27 MED ORDER — MAGNESIUM SULFATE 2 GM/50ML IV SOLN
2.0000 g | Freq: Once | INTRAVENOUS | Status: AC
  Administered 2020-03-27: 2 g via INTRAVENOUS
  Filled 2020-03-27: qty 50

## 2020-03-27 NOTE — Progress Notes (Addendum)
Attempted to call patients brother to notify of the discharge order, via number provided on the face sheet. No answer at this time.   1655: This nurse spoke with Ron & the "neighbor" to notify them of the patient's pending discharge. The "neighbor" expressed concerns that Jordan Perkins (the patient) is to weak at this time to come home. Ron & the neighbor feel that the patient needs to go to "rehab" before coming home due to they are also increased in age and will not be able to provide the assistance that the patient will need at this time. The neighbor stated he will call the patient & try to suggest rehab at this time.    1705: [place] neighbor called to notify this nurse that the patient is agreeable to go to SNF at this time. This nurse verified this with the patient as well. MD notified that patient is wanting to go to a SNF.

## 2020-03-27 NOTE — Discharge Summary (Signed)
Physician Discharge Summary  Jordan Perkins OVF:[id number] DOB: 10-09-1940 DOA: 03/23/2020  PCP: [name], MD  Admit date: 03/23/2020 Discharge date: 03/27/2020  Admitted From: home Disposition: home w/ possible home health   Recommendations for Outpatient Follow-up:  1. Follow up with PCP in 1-2 weeks 2. F/u GI in 1 week as fecal occult was positive 3. F/u cardio in 1 week 4. Hold xarelto until you see GI and/or cardio  Home Health: possibly  Equipment/Devices:  Discharge Condition: stable CODE STATUS: full  Diet recommendation: Heart Healthy   Brief/Interim Summary: HPI was taken from Dr. Blaine Hamper: Jordan Perkins is a 79 y.o. male with medical history significant of hypertension, hyperlipidemia, atrial fibrillation on Xarelto, PAH, CAD, CABG, dCHF, anemia, PAD, gallstone pancreatitis, CKD-3, possible dementia, who presents with multiple falls, generalized weakness and leg edema.  Per his brother, patient may have dementia.  At his normal baseline, patient recognizes and talks to his brother, but does not know the time and place most of the time.  In the past several days, patient has had multiple falls and generalized weakness.  He has worsening bilateral leg edema and abdominal swelling.  His brother does not think patient has chest pain, cough, shortness of breath, fever or chills.  No active nausea, vomiting, diarrhea noted.  No dark stool or rectal bleeding noted per his brother.  Patient moves all extremities. When I saws pt in ED, he is drowsy and confused, knows his own name, but is not orientated to the place and time.  ED Course: pt was found to have BNP 574, troponin 22, 25, positive FOBT, pending COVID-19 PCR, negative urinalysis, stable renal function, potassium 3.3, temperature normal, blood pressure 153/61, heart rate 45, RR 16, oxygen saturation 100% on room air.  CT of head negative for acute issues.  Patient is admitted to progressive bed as  inpatient.  CT-abd/pelvis 1. No specific evidence of acute traumatic injury to the abdomen or pelvis. No evidence of retroperitoneal hematoma. 2. Large volume fluid attenuation ascites throughout the abdomen and pelvis. Hemoperitoneum superimposed upon ascites is not strictly excluded; consider diagnostic paracentesis if there is high concern for hemorrhage in the setting of abdominal trauma. 3. Small right pleural effusion without obvious included rib fracture. 4. Coarse, nodular contour of the liver, suggestive of cirrhosis. 5. Prominent, nonspecific bilateral inguinal and iliac lymph nodes, likely reactive. 6. Anasarca. 7. Coronary artery disease. Aortic Atherosclerosis (ICD10-I70.0).  Hospital Course from Dr. Lenise Herald 7/15-7/18/21: Pt presented w/ generalized weakness and leg edema. Pt was found to have CHF exacerbation that was treated w/ IV lasix, fluid restriction. Pt diuresed fairly well. Of note, pt was also found to have encephalopathy of unknown etiology. Pt did admit he does has some memory problems. I wasn't ever able to obtain pt's baseline as I was not ever able to get ahold of pt's brother despite calling daily. PT/OT saw the pt and recommended SNF but pt refused as well as his brother. CM was able to speak with the pt's brother. For more information, please see other progress notes.   Discharge Diagnoses:  Principal Problem:   Acute on chronic diastolic CHF (congestive heart failure) (HCC) Active Problems:   Atrial fibrillation (HCC)   Hyperlipidemia   Essential hypertension   Normocytic anemia   Stage 3a chronic kidney disease   Coronary artery disease   Fall   Hypokalemia   Elevated troponin   Acute on chronic diastolic CHF: echo on 04/14/1659 showed EF 65%.  Continue on  IV lasix. Strict I/Os & daily weights. Fluid restriction. Neg fluid balance again today. Echo shows EF 60-65%, pulmonary HTN, unable to determine diastolic function. Will need close outpatient  f/u  Likely metabolic encephalopathy: etiology unclear. Improved today. Hx of memory problems as per pt. Rapid response called on 03/24/20 as pt would not respond other than saying "hey" and would not respond to painful stimuli on 03/24/20. CT brain shows no acute intracranial findings. Called pt's brother to discuss pt's baseline but no answer and unable to leave a message   CAD:s/p of CBAG. W/ elevated troponins,likely due to demand ischemia. No chest pain  Atrial fibrillation: likely PAF. Continue to hold metoprolol secondary to bradycardia. Hold Xarelto due to positive FOBT but H&H continues to trend down. Transfuse if Hb < 7. Will continue to monitor   Hyperlipidemia: continue on statin   Essential hypertension: hold metoprolol due to bradycardia. IV hydralazine prn   Normocytic anemia: per ED physician, patient had a positive FOBT. HoldXarelto. Continue on protonix.  CKDIIIa:Cr is stable. Will continue to monitor    Fall:CT head negative. OT/PT recs SNF.  Hypokalemia: WNL today. Will continue to monitor   Hypomagnesemia: mg sulfate given. Will continue to monitor   Thrombocytopenia: etiology unclear. Will continue to monitor   Discharge Instructions  Discharge Instructions    Diet - low sodium heart healthy   Complete by: As directed    Discharge instructions   Complete by: As directed    F/u PCP in 1-2 weeks. F/u GI in 1 week for fecal occult positive. Hold xarelto until you see a GI doctor or your cardiologist. F/u cardio in 1 week.   Increase activity slowly   Complete by: As directed      Allergies as of 03/27/2020      Reactions   Sulfa Antibiotics Rash   Rash/hives      Medication List    TAKE these medications   atorvastatin 80 MG tablet Commonly known as: LIPITOR TAKE 1 TABLET BY MOUTH EVERY DAY   furosemide 40 MG tablet Commonly known as: LASIX Take 40 mg by mouth daily.   hydrochlorothiazide 25 MG tablet Commonly known as:  HYDRODIURIL TAKE 1 TABLET BY MOUTH EVERY DAY   lisinopril 40 MG tablet Commonly known as: ZESTRIL Take 1 tablet (40 mg total) by mouth daily.   metoprolol tartrate 100 MG tablet Commonly known as: LOPRESSOR TAKE 1 TABLET BY MOUTH TWICE A DAY What changed: additional instructions   rivaroxaban 20 MG Tabs tablet Commonly known as: Xarelto Take 1 tablet (20 mg total) by mouth daily with supper.       Follow-up Information    [facility] up on 04/15/2020.   Specialty: Cardiology Why: at 10:30am. Enter through the [facility] entrance Contact information: [place]             Allergies  Allergen Reactions  . Sulfa Antibiotics Rash    Rash/hives     Consultations:     Procedures/Studies: DG Chest 2 View  Result Date: 02/26/2020 CLINICAL DATA:  Leg swelling, generalized weakness EXAM: CHEST - 2 VIEW COMPARISON:  Radiograph 02/17/2019 FINDINGS: Postsurgical changes related to prior CABG including intact and aligned sternotomy wires and multiple surgical clips projecting over the mediastinum. Stable cardiomediastinal contours with a calcified aorta. There are bilateral pleural effusions. Some mild central vascular congestion is present as well. Few peripheral  septal lines. Suspect a background of hyperinflation and emphysema. No focal consolidative opacity or pneumothorax. No acute osseous or soft tissue abnormality. Degenerative changes are present in the imaged spine and shoulders. IMPRESSION: Bilateral pleural effusions and mild central vascular congestion. Few septal lines could suggest mild interstitial edema Prior CABG. Aortic Atherosclerosis (ICD10-I70.0). Electronically Signed   By: Lovena Le M.D.   On: 02/26/2020 19:10   CT HEAD WO CONTRAST  Result Date: 03/24/2020 CLINICAL DATA:  Altered mental status which began this morning. Admitted yesterday  for weakness and recent falls. EXAM: CT HEAD WITHOUT CONTRAST TECHNIQUE: Contiguous axial images were obtained from the base of the skull through the vertex without intravenous contrast. COMPARISON:  03/23/2020 FINDINGS: Brain: No evidence of acute infarction, hemorrhage, hydrocephalus, extra-axial collection or mass lesion/mass effect. There is mild diffuse low-attenuation within the subcortical and periventricular white matter compatible with chronic microvascular disease. Vascular: No hyperdense vessel or unexpected calcification. Skull: Normal. Negative for fracture or focal lesion. Sinuses/Orbits: No acute finding. Other: None. IMPRESSION: 1. No acute intracranial abnormalities. 2. Chronic small vessel ischemic change and brain atrophy. Electronically Signed   By: Kerby Moors M.D.   On: 03/24/2020 10:42   CT Head Wo Contrast  Result Date: 03/23/2020 CLINICAL DATA:  Multiple falls EXAM: CT HEAD WITHOUT CONTRAST TECHNIQUE: Contiguous axial images were obtained from the base of the skull through the vertex without intravenous contrast. COMPARISON:  02/26/2020 FINDINGS: Brain: There is no acute intracranial hemorrhage, mass effect, or edema. Gray-white differentiation is preserved. There is no extra-axial fluid collection. Ventricles and sulci are stable in size and configuration. Patchy hypoattenuation in the supratentorial white matter is nonspecific but may reflect mild chronic microvascular ischemic changes. Vascular: There is atherosclerotic calcification at the skull base. Skull: Calvarium is unremarkable. Sinuses/Orbits: Right posterior ethmoid opacification. Chronic deformity of the medial wall the right orbit. Other: Mastoid air cells are clear. IMPRESSION: No evidence of acute intracranial injury. No significant change since recent prior study. Electronically Signed   By: Macy Mis M.D.   On: 03/23/2020 09:52   CT Head Wo Contrast  Result Date: 02/26/2020 CLINICAL DATA:  Headache weakness  EXAM: CT HEAD WITHOUT CONTRAST TECHNIQUE: Contiguous axial images were obtained from the base of the skull through the vertex without intravenous contrast. COMPARISON:  None. FINDINGS: Brain: No acute territorial infarction, hemorrhage or intracranial mass. Mild atrophy. Mild hypodensity in the white matter consistent with chronic small vessel ischemic change. Chronic appearing lacunar infarcts within the bilateral basal ganglia. Nonenlarged ventricles. Vascular: No hyperdense vessels.  Scattered carotid calcification Skull: Normal. Negative for fracture or focal lesion. Sinuses/Orbits: Old appearing deformity medial wall right orbit. Other: None IMPRESSION: 1. No CT evidence for acute intracranial abnormality. 2. Atrophy and mild chronic small vessel ischemic change of the white matter Electronically Signed   By: Donavan Foil M.D.   On: 02/26/2020 22:51   CT ABDOMEN PELVIS W CONTRAST  Result Date: 03/23/2020 CLINICAL DATA:  Abdominal trauma, right flank hematoma, fall 1 week ago, hemoglobin drop, evaluate for retroperitoneal hematoma EXAM: CT ABDOMEN AND PELVIS WITH CONTRAST TECHNIQUE: Multidetector CT imaging of the abdomen and pelvis was performed using the standard protocol following bolus administration of intravenous contrast. CONTRAST:  58mL OMNIPAQUE IOHEXOL 300 MG/ML  SOLN COMPARISON:  06/11/2013 FINDINGS: Lower chest: Small right pleural effusion. Bibasilar atelectasis and/or scarring. Coronary artery calcifications. Hepatobiliary: Coarse, nodular contour of the liver. Status post cholecystectomy. No biliary dilatation. Pancreas: Unremarkable. No pancreatic ductal dilatation or surrounding inflammatory changes.  Spleen: Normal in size without significant abnormality. Adrenals/Urinary Tract: Adrenal glands are unremarkable. Kidneys are normal, without renal calculi, solid lesion, or hydronephrosis. Foley catheter in the urinary bladder. Stomach/Bowel: Stomach is within normal limits. Appendix appears  normal. No evidence of bowel wall thickening, distention, or inflammatory changes. Vascular/Lymphatic: Aortic atherosclerosis. Prominent, nonspecific bilateral inguinal and iliac lymph nodes. Reproductive: No mass or other significant abnormality. Other: Anasarca. Large volume fluid attenuation ascites throughout the abdomen and pelvis. Musculoskeletal: No acute or significant osseous findings. Nonacute fracture of the left transverse process of L3 (series 3, image 39). IMPRESSION: 1. No specific evidence of acute traumatic injury to the abdomen or pelvis. No evidence of retroperitoneal hematoma. 2. Large volume fluid attenuation ascites throughout the abdomen and pelvis. Hemoperitoneum superimposed upon ascites is not strictly excluded; consider diagnostic paracentesis if there is high concern for hemorrhage in the setting of abdominal trauma. 3. Small right pleural effusion without obvious included rib fracture. 4. Coarse, nodular contour of the liver, suggestive of cirrhosis. 5. Prominent, nonspecific bilateral inguinal and iliac lymph nodes, likely reactive. 6. Anasarca. 7. Coronary artery disease.  Aortic Atherosclerosis (ICD10-I70.0). Electronically Signed   By: Eddie Candle M.D.   On: 03/23/2020 09:57   DG Chest Portable 1 View  Result Date: 03/23/2020 CLINICAL DATA:  Weakness EXAM: PORTABLE CHEST 1 VIEW COMPARISON:  02/26/2020 FINDINGS: Persistent right pleural effusion with associated atelectasis. Pulmonary vascular congestion likely superimposed on chronic interstitial prominence. No pneumothorax. Stable cardiomediastinal IMPRESSION: Persistent small right pleural effusion and right basilar atelectasis. Pulmonary vascular congestion. Electronically Signed   By: Macy Mis M.D.   On: 03/23/2020 08:35   ECHOCARDIOGRAM COMPLETE  Result Date: 03/24/2020    ECHOCARDIOGRAM REPORT   Patient Name:   GEARALD STONEBRAKER Date of Exam: 03/23/2020 Medical Rec #:  [id number]           Height:       71.0 in  Accession #:    [id number]          Weight:       196.2 lb Date of Birth:  29-Nov-1940          BSA:          2.091 m Patient Age:    39 years            BP:           157/57 mmHg Patient Gender: M                   HR:           59 bpm. Exam Location:  [facility] Procedure: 2D Echo, Cardiac Doppler and Color Doppler Indications:     D66.44 Acute Diastolic Heart Failure.  History:         Patient has prior history of Echocardiogram examinations, most                  recent 02/17/2019. Risk Factors:Hypertension and Dyslipidemia.                  Pleural effusion. Atrial Fibrillation. Coronary artery disease.                  Heart failure.  Sonographer:     Wilford Sports Rodgers-Jones Referring Phys:  [place] Diagnosing Phys: Kate Sable MD IMPRESSIONS  1. Left ventricular ejection fraction, by estimation, is 60 to 65%. The left ventricle has normal function. The left ventricle demonstrates global hypokinesis. Left ventricular diastolic parameters are  indeterminate.  2. Right ventricular systolic function is mildly reduced. The right ventricular size is mildly enlarged. There is severely elevated pulmonary artery systolic pressure. The estimated right ventricular systolic pressure is 106.2 mmHg.  3. Left atrial size was mildly dilated.  4. Right atrial size was mildly dilated.  5. The mitral valve is grossly normal. Mild mitral valve regurgitation.  6. The aortic valve is tricuspid. Aortic valve regurgitation is trivial. Mild aortic valve sclerosis is present, with no evidence of aortic valve stenosis.  7. The inferior vena cava is dilated in size with <50% respiratory variability, suggesting right atrial pressure of 15 mmHg. FINDINGS  Left Ventricle: Left ventricular ejection fraction, by estimation, is 60 to 65%. The left ventricle has normal function. The left ventricle demonstrates global hypokinesis. The left ventricular internal cavity size was normal in size. There is borderline left ventricular hypertrophy.  Left ventricular diastolic parameters are indeterminate. Right Ventricle: The right ventricular size is mildly enlarged. No increase in right ventricular wall thickness. Right ventricular systolic function is mildly reduced. There is severely elevated pulmonary artery systolic pressure. The tricuspid regurgitant velocity is 4.75 m/s, and with an assumed right atrial pressure of 15 mmHg, the estimated right ventricular systolic pressure is 694.8 mmHg. Left Atrium: Left atrial size was mildly dilated. Right Atrium: Right atrial size was mildly dilated. Pericardium: There is no evidence of pericardial effusion. Mitral Valve: The mitral valve is grossly normal. There is mild calcification of the anterior mitral valve leaflet(s). Mild mitral valve regurgitation. Tricuspid Valve: The tricuspid valve is normal in structure. Tricuspid valve regurgitation is mild. Aortic Valve: The aortic valve is tricuspid. Aortic valve regurgitation is trivial. Mild aortic valve sclerosis is present, with no evidence of aortic valve stenosis. Pulmonic Valve: The pulmonic valve was normal in structure. Pulmonic valve regurgitation is not visualized. Aorta: The aortic root is normal in size and structure. Venous: The inferior vena cava is dilated in size with less than 50% respiratory variability, suggesting right atrial pressure of 15 mmHg. IAS/Shunts: No atrial level shunt detected by color flow Doppler.  LEFT VENTRICLE PLAX 2D LVIDd:         4.44 cm LVIDs:         3.21 cm LV PW:         1.05 cm LV IVS:        1.05 cm LVOT diam:     2.10 cm LV SV:         85 LV SV Index:   41 LVOT Area:     3.46 cm  RIGHT VENTRICLE            IVC RV Basal diam:  4.42 cm    IVC diam: 2.23 cm RV S prime:     9.26 cm/s TAPSE (M-mode): 1.0 cm LEFT ATRIUM             Index       RIGHT ATRIUM           Index LA diam:        5.30 cm 2.53 cm/m  RA Area:     18.40 cm LA Vol (A2C):   80.2 ml 38.35 ml/m RA Volume:   54.30 ml  25.96 ml/m LA Vol (A4C):   71.7 ml  34.28 ml/m LA Biplane Vol: 77.5 ml 37.06 ml/m  AORTIC VALVE LVOT Vmax:   78.30 cm/s LVOT Vmean:  64.700 cm/s LVOT VTI:    0.245 m  AORTA Ao Root diam: 3.50 cm Ao  Asc diam:  3.80 cm MV E velocity: 144.50 cm/s  TRICUSPID VALVE                             TR Peak grad:   90.2 mmHg                             TR Vmax:        475.00 cm/s                              SHUNTS                             Systemic VTI:  0.24 m                             Systemic Diam: 2.10 cm Kate Sable MD Electronically signed by Kate Sable MD Signature Date/Time: 03/24/2020/1:39:09 PM    Final       Subjective: Pt denies any complaints    Discharge Exam: Vitals:   03/27/20 0722 03/27/20 1112  BP: (!) 142/46 (!) 129/44  Pulse: (!) 54 (!) 49  Resp: 18 20  Temp: 98.3 F (36.8 C) 97.8 F (36.6 C)  SpO2: 100% 100%   Vitals:   03/26/20 1939 03/27/20 0328 03/27/20 0722 03/27/20 1112  BP: (!) 135/45 (!) 125/52 (!) 142/46 (!) 129/44  Pulse: 62 67 (!) 54 (!) 49  Resp: 18 16 18 20   Temp: 98.2 F (36.8 C) 97.7 F (36.5 C) 98.3 F (36.8 C) 97.8 F (36.6 C)  TempSrc: Oral Oral  Oral  SpO2: 97% 97% 100% 100%  Weight:  78.2 kg    Height:        General: Pt is alert, awake, not in acute distress Cardiovascular:S1/S2 +, no rubs, no gallops Respiratory: decreased breath sounds b/l, no wheezing, no rhonchi Abdominal: Soft, NT, ND, bowel sounds + Extremities:  no cyanosis    The results of significant diagnostics from this hospitalization (including imaging, microbiology, ancillary and laboratory) are listed below for reference.     Microbiology: Recent Results (from the past 240 hour(s))  SARS Coronavirus 2 by RT PCR (hospital order, performed in [facility] hospital lab) Nasopharyngeal Nasopharyngeal Swab     Status: None   Collection Time: 03/23/20 11:55 AM   Specimen: Nasopharyngeal Swab  Result Value Ref Range Status   SARS Coronavirus 2 NEGATIVE NEGATIVE Final    Comment:  (NOTE) SARS-CoV-2 target nucleic acids are NOT DETECTED.  The SARS-CoV-2 RNA is generally detectable in upper and lower respiratory specimens during the acute phase of infection. The lowest concentration of SARS-CoV-2 viral copies this assay can detect is 250 copies / mL. A negative result does not preclude SARS-CoV-2 infection and should not be used as the sole basis for treatment or other patient management decisions.  A negative result may occur with improper specimen collection / handling, submission of specimen other than nasopharyngeal swab, presence of viral mutation(s) within the areas targeted by this assay, and inadequate number of viral copies (<250 copies / mL). A negative result must be combined with clinical observations, patient history, and epidemiological information.  Fact Sheet for Patients:   StrictlyIdeas.no  Fact Sheet for Healthcare Providers: BankingDealers.co.za  This test is not  yet approved or  cleared by the [place] and has been authorized for detection and/or diagnosis of SARS-CoV-2 by FDA under an Emergency Use Authorization (EUA).  This EUA will remain in effect (meaning this test can be used) for the duration of the COVID-19 declaration under Section 564(b)(1) of the Act, 21 U.S.C. section 360bbb-3(b)(1), unless the authorization is terminated or revoked sooner.  Performed at [facility], [address] [place], [place] [address]      Labs: BNP (last 3 results) Recent Labs    02/26/20 1808 03/23/20 0728  BNP 492.2* 397.6*   Basic Metabolic Panel: Recent Labs  Lab 03/23/20 0728 03/24/20 0434 03/25/20 0331 03/26/20 0352 03/27/20 0434  NA 135 138 140 136 136  K 3.3* 3.3* 3.1* 2.8* 3.9  CL 95* 97* 101 97* 100  CO2 28 29 30 29 28   GLUCOSE 141* 133* 142* 133* 155*  BUN 44* 36* 33* 27* 27*  CREATININE 1.40* 1.24 1.15 1.11 1.23  CALCIUM 9.0 9.0 8.8* 8.4* 7.9*  MG  --   1.6* 1.9 1.8 1.6*  PHOS  --   --  3.4 2.7 1.9*   Liver Function Tests: Recent Labs  Lab 03/23/20 0728  AST 30  ALT 17  ALKPHOS 66  BILITOT 2.4*  PROT 8.0  ALBUMIN 3.2*   No results for input(s): LIPASE, AMYLASE in the last 168 hours. No results for input(s): AMMONIA in the last 168 hours. CBC: Recent Labs  Lab 03/23/20 2328 03/24/20 0434 03/25/20 0331 03/26/20 0352 03/27/20 0434  WBC 9.3 8.1 9.3 11.5* 9.5  HGB 9.6* 9.5* 9.0* 8.6* 8.5*  HCT 26.8* 27.2* 26.2* 24.5* 24.5*  MCV 97.1 98.2 100.8* 99.2 101.7*  PLT 173 175 166 143* 142*   Cardiac Enzymes: No results for input(s): CKTOTAL, CKMB, CKMBINDEX, TROPONINI in the last 168 hours. BNP: Invalid input(s): POCBNP CBG: Recent Labs  Lab 03/24/20 0722 03/24/20 1001 03/24/20 1119 03/24/20 1616 03/24/20 2108  GLUCAP 109* 116* 103* 134* 132*   D-Dimer No results for input(s): DDIMER in the last 72 hours. Hgb A1c No results for input(s): HGBA1C in the last 72 hours. Lipid Profile No results for input(s): CHOL, HDL, LDLCALC, TRIG, CHOLHDL, LDLDIRECT in the last 72 hours. Thyroid function studies No results for input(s): TSH, T4TOTAL, T3FREE, THYROIDAB in the last 72 hours.  Invalid input(s): FREET3 Anemia work up No results for input(s): VITAMINB12, FOLATE, FERRITIN, TIBC, IRON, RETICCTPCT in the last 72 hours. Urinalysis    Component Value Date/Time   COLORURINE YELLOW (A) 03/23/2020 0913   APPEARANCEUR CLEAR (A) 03/23/2020 0913   APPEARANCEUR Hazy 06/06/2013 0947   LABSPEC 1.011 03/23/2020 0913   LABSPEC 1.021 06/06/2013 0947   PHURINE 7.0 03/23/2020 0913   GLUCOSEU NEGATIVE 03/23/2020 0913   GLUCOSEU Negative 06/06/2013 0947   HGBUR NEGATIVE 03/23/2020 0913   BILIRUBINUR NEGATIVE 03/23/2020 0913   BILIRUBINUR Negative 06/06/2013 0947   KETONESUR NEGATIVE 03/23/2020 0913   PROTEINUR NEGATIVE 03/23/2020 0913   NITRITE NEGATIVE 03/23/2020 0913   LEUKOCYTESUR NEGATIVE 03/23/2020 0913   LEUKOCYTESUR  Negative 06/06/2013 0947   Sepsis Labs Invalid input(s): PROCALCITONIN,  WBC,  LACTICIDVEN Microbiology Recent Results (from the past 240 hour(s))  SARS Coronavirus 2 by RT PCR (hospital order, performed in [facility] hospital lab) Nasopharyngeal Nasopharyngeal Swab     Status: None   Collection Time: 03/23/20 11:55 AM   Specimen: Nasopharyngeal Swab  Result Value Ref Range Status   SARS Coronavirus 2 NEGATIVE NEGATIVE Final  Comment: (NOTE) SARS-CoV-2 target nucleic acids are NOT DETECTED.  The SARS-CoV-2 RNA is generally detectable in upper and lower respiratory specimens during the acute phase of infection. The lowest concentration of SARS-CoV-2 viral copies this assay can detect is 250 copies / mL. A negative result does not preclude SARS-CoV-2 infection and should not be used as the sole basis for treatment or other patient management decisions.  A negative result may occur with improper specimen collection / handling, submission of specimen other than nasopharyngeal swab, presence of viral mutation(s) within the areas targeted by this assay, and inadequate number of viral copies (<250 copies / mL). A negative result must be combined with clinical observations, patient history, and epidemiological information.  Fact Sheet for Patients:   StrictlyIdeas.no  Fact Sheet for Healthcare Providers: BankingDealers.co.za  This test is not yet approved or  cleared by the [place] FDA and has been authorized for detection and/or diagnosis of SARS-CoV-2 by FDA under an Emergency Use Authorization (EUA).  This EUA will remain in effect (meaning this test can be used) for the duration of the COVID-19 declaration under Section 564(b)(1) of the Act, 21 U.S.C. section 360bbb-3(b)(1), unless the authorization is terminated or revoked sooner.  Performed at [facility], [address] [place], [place] [address]      Time  coordinating discharge: Over 30 minutes  SIGNED:   Wyvonnia Dusky, MD  [facility] 03/27/2020, 2:53 PM Pager   If 7PM-7AM, please contact night-coverage [email or web address]

## 2020-03-28 DIAGNOSIS — G9341 Metabolic encephalopathy: Secondary | ICD-10-CM | POA: Diagnosis not present

## 2020-03-28 DIAGNOSIS — I5033 Acute on chronic diastolic (congestive) heart failure: Secondary | ICD-10-CM | POA: Diagnosis not present

## 2020-03-28 LAB — BASIC METABOLIC PANEL
Anion gap: 5 (ref 5–15)
BUN: 27 mg/dL — ABNORMAL HIGH (ref 8–23)
CO2: 27 mmol/L (ref 22–32)
Calcium: 7.8 mg/dL — ABNORMAL LOW (ref 8.9–10.3)
Chloride: 102 mmol/L (ref 98–111)
Creatinine, Ser: 1.02 mg/dL (ref 0.61–1.24)
GFR calc Af Amer: >60 mL/min (ref >60)
GFR calc non Af Amer: >60 mL/min (ref >60)
Glucose, Bld: 141 mg/dL — ABNORMAL HIGH (ref 70–99)
Potassium: 3.9 mmol/L (ref 3.5–5.1)
Sodium: 134 mmol/L — ABNORMAL LOW (ref 135–145)

## 2020-03-28 LAB — MAGNESIUM: Magnesium: 1.9 mg/dL (ref 1.7–2.4)

## 2020-03-28 LAB — CBC
HCT: 24.7 % — ABNORMAL LOW (ref 39.0–52.0)
Hemoglobin: 8.3 g/dL — ABNORMAL LOW (ref 13.0–17.0)
MCH: 35 pg — ABNORMAL HIGH (ref 26.0–34.0)
MCHC: 33.6 g/dL (ref 30.0–36.0)
MCV: 104.2 fL — ABNORMAL HIGH (ref 80.0–100.0)
Platelets: 151 10*3/uL (ref 150–400)
RBC: 2.37 MIL/uL — ABNORMAL LOW (ref 4.22–5.81)
RDW: 15.2 % (ref 11.5–15.5)
WBC: 8 10*3/uL (ref 4.0–10.5)
nRBC: 0 % (ref 0.0–0.2)

## 2020-03-28 LAB — PHOSPHORUS: Phosphorus: 1.7 mg/dL — ABNORMAL LOW (ref 2.5–4.6)

## 2020-03-28 MED ORDER — POTASSIUM & SODIUM PHOSPHATES 280-160-250 MG PO PACK
1.0000 | PACK | Freq: Three times a day (TID) | ORAL | Status: AC
  Administered 2020-03-28 – 2020-03-29 (×7): 1 via ORAL
  Filled 2020-03-28 (×8): qty 1

## 2020-03-28 MED ORDER — FUROSEMIDE 40 MG PO TABS
40.0000 mg | ORAL_TABLET | Freq: Every day | ORAL | Status: DC
  Administered 2020-03-29 – 2020-03-30 (×2): 40 mg via ORAL
  Filled 2020-03-28 (×2): qty 1

## 2020-03-28 NOTE — Care Management Important Message (Signed)
Important Message  Patient Details  Name: Jordan Perkins MRN: [id number] Date of Birth: November 26, 1940   Medicare Important Message Given:  Yes     [name] 03/28/2020, 12:39 PM

## 2020-03-28 NOTE — TOC Progression Note (Signed)
Transition of Care [facility]) - Progression Note    Patient Details  Name: Jordan Perkins MRN: [id number] Date of Birth: Sep 14, 1940  Transition of Care [facility]) CM/SW Contact  Shelbie Ammons, RN Phone Number: 03/28/2020, 2:20 PM  Clinical Narrative:   RNCM met with patient in room x2, patient sitting up in chair alert and talkative and reports to feeling better today. Initially patient was very hesitant to discuss or agree to SNF placement but was willing to allow this RNCM to start a bed search. Patient reported that he did not have a preference as to where.  RNCM placed several calls to both patient's brother and neighbor without success, was able to leave VM for neighbor.  RNCM met with patient again in room to discuss bed offers made from [facility] and [facility]. Patient wants to accept bed from [facility]. RNCM accepted bed in hub and left VM for Magda Paganini with [facility] to inform her of same. Facility will start insurance authorization.           Expected Discharge Plan and Services           Expected Discharge Date: 03/27/20                                     Social Determinants of Health (SDOH) Interventions    Readmission Risk Interventions No flowsheet data found.

## 2020-03-28 NOTE — NC FL2 (Signed)
[place] LEVEL OF CARE SCREENING TOOL     IDENTIFICATION  Patient Name: Jordan Perkins Birthdate: [birth year] Sex: male Admission Date (Current Location): 03/23/2020  [place] and [place] Number:  Engineering geologist and Address:  [facility], [address] [place], [place] [address]      Provider Number: [id number]  Attending Physician Name and Address:  Wyvonnia Dusky, MD  Relative Name and Phone Number:  Ahmar Pickrell [phone]    Current Level of Care: Hospital Recommended Level of Care: [place] Prior Approval Number:    Date Approved/Denied:   PASRR Number: [id number]  Discharge Plan: SNF    Current Diagnoses: Patient Active Problem List   Diagnosis Date Noted  . Acute on chronic diastolic CHF (congestive heart failure) ([place]) 03/23/2020  . Coronary artery disease   . Fall   . Hypokalemia   . Elevated troponin   . Anasarca   . Bradycardia   . Stage 3a chronic kidney disease 07/12/2019  . Prediabetes 06/12/2019  . Normocytic anemia 03/10/2019  . Pulmonary hypertension, unspecified ([place])   . Pleural effusion   . CHF (congestive heart failure) ([place]) 02/16/2019  . PAD (peripheral artery disease) ([place]) 01/02/2019  . Bilateral carotid artery disease ([place]) 01/02/2019  . Memory difficulty 11/05/2016  . Seborrheic keratoses 11/05/2016  . Hyponatremia 10/29/2016  . Encounter for anticoagulation discussion and counseling 07/21/2014  . CAD (coronary artery disease) 06/23/2013  . 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  . Atrial fibrillation ([place]) 06/23/2013  . Hyperlipidemia 06/23/2013  . Essential hypertension 06/23/2013  . Gallstone pancreatitis 06/23/2013    Orientation RESPIRATION BLADDER Height & Weight     Self  Normal Indwelling catheter Weight: 78.6 kg Height:  5\' 11"  (180.3 cm)  BEHAVIORAL SYMPTOMS/MOOD NEUROLOGICAL BOWEL NUTRITION STATUS      Incontinent Diet (Heart  Healthy)  AMBULATORY STATUS COMMUNICATION OF NEEDS Skin   Extensive Assist Verbally Normal                       Personal Care Assistance Level of Assistance  Dressing, Feeding, Bathing Bathing Assistance: Limited assistance Feeding assistance: Independent Dressing Assistance: Limited assistance     Functional Limitations Info  Sight, Hearing, Speech Sight Info: Adequate Hearing Info: Adequate Speech Info: Adequate    SPECIAL CARE FACTORS FREQUENCY  PT (By licensed PT), OT (By licensed OT)                    Contractures Contractures Info: Not present    Additional Factors Info  Code Status, Allergies Code Status Info: Full Allergies Info: No known allergies           Current Medications (03/28/2020):  This is the current hospital active medication list Current Facility-Administered Medications  Medication Dose Route Frequency Provider Last Rate Last Admin  . 0.9 %  sodium chloride infusion  250 mL Intravenous PRN Ivor Costa, MD      . acetaminophen (TYLENOL) tablet 650 mg  650 mg Oral Q6H PRN Ivor Costa, MD   650 mg at 03/28/20 0451  . atorvastatin (LIPITOR) tablet 80 mg  80 mg Oral Daily Ivor Costa, MD   80 mg at 03/28/20 0914  . Chlorhexidine Gluconate Cloth 2 % PADS 6 each  6 each Topical Daily Ivor Costa, MD   6 each at 03/26/20 1000  . hydrALAZINE (APRESOLINE) injection 5 mg  5 mg Intravenous Q2H PRN Ivor Costa, MD      .  lisinopril (ZESTRIL) tablet 40 mg  40 mg Oral Daily Ivor Costa, MD   40 mg at 03/28/20 0914  . ondansetron (ZOFRAN) injection 4 mg  4 mg Intravenous Q8H PRN Ivor Costa, MD      . pantoprazole (PROTONIX) EC tablet 40 mg  40 mg Oral BID Wyvonnia Dusky, MD   40 mg at 03/28/20 0914  . potassium & sodium phosphates (PHOS-NAK) 280-160-250 MG packet 1 packet  1 packet Oral TID WC & HS Wyvonnia Dusky, MD   1 packet at 03/28/20 0914  . sodium chloride flush (NS) 0.9 % injection 3 mL  3 mL Intravenous Q12H Ivor Costa, MD   3 mL at  03/28/20 0917  . sodium chloride flush (NS) 0.9 % injection 3 mL  3 mL Intravenous PRN Ivor Costa, MD      . traMADol Veatrice Bourbon) tablet 50 mg  50 mg Oral Q6H PRN Sharion Settler, NP   50 mg at 03/26/20 0040     Discharge Medications: Please see discharge summary for a list of discharge medications.  Relevant Imaging Results:  Relevant Lab Results:   Additional Information SS# [id number]  Shelbie Ammons, RN

## 2020-03-28 NOTE — Progress Notes (Signed)
Mobility Specialist - Progress Note   03/28/20 1047  Mobility  Activity Ambulated in room  Level of Assistance Minimal assist, patient does 75% or more  Assistive Device Front wheel walker  Distance Ambulated (ft) 20 ft  Mobility Response Tolerated well  Mobility performed by Mobility specialist  $Mobility charge 1 Mobility   Post-mobility: 72 HR, 124/42 BP, 100% SpO2   Pt was on BSC with NT upon arrival. Pt agreed to ambulating to chair with Mobility Specialist after using BSC. Pt ambulation pattern was slow and steady walking around the bed and towards the chair. Pt needed cueing when turning around. Pt had LOB walking back into the chair, required assist from Mobility Specialist to recover balance. Pt was able to recover, but rushed to sit on the chair. Overall, pt tolerated session well. Pt was left in chair with call bell and phone in reach, NT was present.     Zamira Hickam Mobility Specialist  03/28/20, 10:54 AM

## 2020-03-28 NOTE — Progress Notes (Signed)
PROGRESS NOTE    Jordan Perkins  ZRA:[id number] DOB: Sep 29, 1940 DOA: 03/23/2020 PCP: [name], MD    Assessment & Plan:   Principal Problem:   Acute on chronic diastolic CHF (congestive heart failure) ([place]) Active Problems:   Atrial fibrillation ([place])   Hyperlipidemia   Essential hypertension   Normocytic anemia   Stage 3a chronic kidney disease   Coronary artery disease   Fall   Hypokalemia   Elevated troponin   Acute on chronic diastolic CHF: echo on 01/12/5624 showed EF 65%. Transition to po lasix. Strict I/Os & daily weights. Fluid restriction. Neg fluid balance again. Echo shows EF 60-65%, pulmonary HTN, unable to determine diastolic function. Will need close outpatient f/u  Likely metabolic encephalopathy: etiology unclear. Rapid response called on 03/24/20 as pt would not respond other than saying "hey" and would not respond to painful stimuli on 03/24/20. CT brain shows no acute intracranial findings. Called pt's brother to discuss pt's baseline but no answer and unable to leave a message. Back to baseline likely   CAD: s/p of CBAG. W/ elevated troponins, likely due to demand ischemia. No chest pain  Atrial fibrillation: likely PAF. Continue to hold metoprolol secondary to bradycardia. Hold Xarelto due to positive FOBT but H&H continues to trend down. Transfuse if Hb < 7. Will continue to monitor   Hyperlipidemia: continue on statin   Essential hypertension: hold metoprolol due to bradycardia. IV hydralazine prn   Normocytic anemia: per ED physician, patient had a positive FOBT. Hold Xarelto. Continue on protonix.  CKDIIIa: Cr is stable. Will continue to monitor    Fall: CT head negative. PT/OT recs SNF.   Hypokalemia: WNL today. Will continue to monitor   Hypomagnesemia: WNL today. Will continue to monitor   Hypophosphatemia: phosphate repleted. Will continue to monitor   Thrombocytopenia: resolved   DVT prophylaxis: SCDs Code Status: full   Family Communication: called pt's brother again each day but no answer and unable to leave a message Disposition Plan: likely d/c to SNF   Status is: Inpatient  Remains inpatient appropriate because: stable for d/c but pt's brother refused to take the pt home 03/27/20 stating he needed to go to SNF but initially pt and pt's brother were both refusing SNF    Dispo: The patient is from: Home              Anticipated d/c is to: SNF              Anticipated d/c date is: 3 days or couple of weeks waiting on auth from [place]               Patient currently is currently medically stable for d/c      Consultants:      Procedures:    Antimicrobials:    Subjective: Pt c/o fatigue  Objective: Vitals:   03/27/20 1603 03/27/20 1925 03/28/20 0445 03/28/20 0545  BP: (!) 138/58 (!) 143/47 (!) 137/50   Pulse: 68 75 63   Resp: 18 20 18    Temp: 98.2 F (36.8 C) 98 F (36.7 C) 100.3 F (37.9 C) 98.7 F (37.1 C)  TempSrc: Oral Oral Oral Oral  SpO2: 99% 91% 99%   Weight:   78.6 kg   Height:        Intake/Output Summary (Last 24 hours) at 03/28/2020 0819 Last data filed at 03/28/2020 0400 Gross per 24 hour  Intake 240 ml  Output 1400 ml  Net -1160 ml  Filed Weights   03/24/20 0451 03/27/20 0328 03/28/20 0445  Weight: 78.9 kg 78.2 kg 78.6 kg    Examination:  General exam: Appears calm and comfortable  Respiratory system: decreased breath sounds b/l. No wheezes, rales Cardiovascular system: bradycardia. No rubs, gallops or clicks.  Gastrointestinal system: Abdomen is nondistended, soft and nontender.  Hypoactive bowel sounds heard. Central nervous system: Awake and oriented. Moves all 4 extremities  Psychiatry: Judgement and insight appear normal. Flat mood and affect    Data Reviewed: I have personally reviewed following labs and imaging studies  CBC: Recent Labs  Lab 03/24/20 0434 03/25/20 0331 03/26/20 0352 03/27/20 0434 03/28/20 0302  WBC 8.1 9.3 11.5* 9.5  8.0  HGB 9.5* 9.0* 8.6* 8.5* 8.3*  HCT 27.2* 26.2* 24.5* 24.5* 24.7*  MCV 98.2 100.8* 99.2 101.7* 104.2*  PLT 175 166 143* 142* 916   Basic Metabolic Panel: Recent Labs  Lab 03/24/20 0434 03/25/20 0331 03/26/20 0352 03/27/20 0434 03/28/20 0302  NA 138 140 136 136 134*  K 3.3* 3.1* 2.8* 3.9 3.9  CL 97* 101 97* 100 102  CO2 29 30 29 28 27   GLUCOSE 133* 142* 133* 155* 141*  BUN 36* 33* 27* 27* 27*  CREATININE 1.24 1.15 1.11 1.23 1.02  CALCIUM 9.0 8.8* 8.4* 7.9* 7.8*  MG 1.6* 1.9 1.8 1.6* 1.9  PHOS  --  3.4 2.7 1.9* 1.7*   GFR: Estimated Creatinine Clearance: 65.6 mL/min (by C-G formula based on SCr of 1.02 mg/dL). Liver Function Tests: Recent Labs  Lab 03/23/20 0728  AST 30  ALT 17  ALKPHOS 66  BILITOT 2.4*  PROT 8.0  ALBUMIN 3.2*   No results for input(s): LIPASE, AMYLASE in the last 168 hours. No results for input(s): AMMONIA in the last 168 hours. Coagulation Profile: Recent Labs  Lab 03/23/20 1155  INR 2.3*   Cardiac Enzymes: No results for input(s): CKTOTAL, CKMB, CKMBINDEX, TROPONINI in the last 168 hours. BNP (last 3 results) No results for input(s): PROBNP in the last 8760 hours. HbA1C: No results for input(s): HGBA1C in the last 72 hours. CBG: Recent Labs  Lab 03/24/20 0722 03/24/20 1001 03/24/20 1119 03/24/20 1616 03/24/20 2108  GLUCAP 109* 116* 103* 134* 132*   Lipid Profile: No results for input(s): CHOL, HDL, LDLCALC, TRIG, CHOLHDL, LDLDIRECT in the last 72 hours. Thyroid Function Tests: No results for input(s): TSH, T4TOTAL, FREET4, T3FREE, THYROIDAB in the last 72 hours. Anemia Panel: No results for input(s): VITAMINB12, FOLATE, FERRITIN, TIBC, IRON, RETICCTPCT in the last 72 hours. Sepsis Labs: No results for input(s): PROCALCITON, LATICACIDVEN in the last 168 hours.  Recent Results (from the past 240 hour(s))  SARS Coronavirus 2 by RT PCR (hospital order, performed in [facility] hospital lab) Nasopharyngeal Nasopharyngeal Swab      Status: None   Collection Time: 03/23/20 11:55 AM   Specimen: Nasopharyngeal Swab  Result Value Ref Range Status   SARS Coronavirus 2 NEGATIVE NEGATIVE Final    Comment: (NOTE) SARS-CoV-2 target nucleic acids are NOT DETECTED.  The SARS-CoV-2 RNA is generally detectable in upper and lower respiratory specimens during the acute phase of infection. The lowest concentration of SARS-CoV-2 viral copies this assay can detect is 250 copies / mL. A negative result does not preclude SARS-CoV-2 infection and should not be used as the sole basis for treatment or other patient management decisions.  A negative result may occur with improper specimen collection / handling, submission of specimen other than nasopharyngeal swab, presence of viral mutation(s)  within the areas targeted by this assay, and inadequate number of viral copies (<250 copies / mL). A negative result must be combined with clinical observations, patient history, and epidemiological information.  Fact Sheet for Patients:   StrictlyIdeas.no  Fact Sheet for Healthcare Providers: BankingDealers.co.za  This test is not yet approved or  cleared by the [place] FDA and has been authorized for detection and/or diagnosis of SARS-CoV-2 by FDA under an Emergency Use Authorization (EUA).  This EUA will remain in effect (meaning this test can be used) for the duration of the COVID-19 declaration under Section 564(b)(1) of the Act, 21 U.S.C. section 360bbb-3(b)(1), unless the authorization is terminated or revoked sooner.  Performed at [facility], [address], [place], [place] [address]          Radiology Studies: No results found.      Scheduled Meds: . atorvastatin  80 mg Oral Daily  . Chlorhexidine Gluconate Cloth  6 each Topical Daily  . lisinopril  40 mg Oral Daily  . pantoprazole  40 mg Oral BID  . potassium & sodium phosphates  1 packet Oral TID  WC & HS  . sodium chloride flush  3 mL Intravenous Q12H   Continuous Infusions: . sodium chloride       LOS: 5 days    Time spent: 30 mins     Wyvonnia Dusky, MD [facility] Pager [phone]  If 7PM-7AM, please contact night-coverage [email or web address] 03/28/2020, 8:19 AM

## 2020-03-28 NOTE — Progress Notes (Signed)
Foley removed- patient tolerated well.  When emptying the foley bag- noted strong odor, cloudy appearance with sediment.   Condom cath. placed  Foley removal time was 6pm Bladder scan due around 12 am-  Night nurse made aware  Assisted patient back to bed- patient and another loose stool (type 7).  This is the 4th or 5th loose stool in last 24hrs.   Provided peri care and new gown/socks  Paged MD - let her know about the stool and urine.   MD placed order for urine culture.

## 2020-03-29 DIAGNOSIS — I5033 Acute on chronic diastolic (congestive) heart failure: Secondary | ICD-10-CM | POA: Diagnosis not present

## 2020-03-29 LAB — CBC
HCT: 22.8 % — ABNORMAL LOW (ref 39.0–52.0)
Hemoglobin: 8 g/dL — ABNORMAL LOW (ref 13.0–17.0)
MCH: 35.2 pg — ABNORMAL HIGH (ref 26.0–34.0)
MCHC: 35.1 g/dL (ref 30.0–36.0)
MCV: 100.4 fL — ABNORMAL HIGH (ref 80.0–100.0)
Platelets: 128 10*3/uL — ABNORMAL LOW (ref 150–400)
RBC: 2.27 MIL/uL — ABNORMAL LOW (ref 4.22–5.81)
RDW: 15 % (ref 11.5–15.5)
WBC: 5.6 10*3/uL (ref 4.0–10.5)
nRBC: 0 % (ref 0.0–0.2)

## 2020-03-29 LAB — BASIC METABOLIC PANEL
Anion gap: 5 (ref 5–15)
BUN: 28 mg/dL — ABNORMAL HIGH (ref 8–23)
CO2: 28 mmol/L (ref 22–32)
Calcium: 7.6 mg/dL — ABNORMAL LOW (ref 8.9–10.3)
Chloride: 100 mmol/L (ref 98–111)
Creatinine, Ser: 1.11 mg/dL (ref 0.61–1.24)
GFR calc Af Amer: >60 mL/min (ref >60)
GFR calc non Af Amer: >60 mL/min (ref >60)
Glucose, Bld: 148 mg/dL — ABNORMAL HIGH (ref 70–99)
Potassium: 3.8 mmol/L (ref 3.5–5.1)
Sodium: 133 mmol/L — ABNORMAL LOW (ref 135–145)

## 2020-03-29 LAB — MAGNESIUM: Magnesium: 1.6 mg/dL — ABNORMAL LOW (ref 1.7–2.4)

## 2020-03-29 LAB — PHOSPHORUS: Phosphorus: 2.1 mg/dL — ABNORMAL LOW (ref 2.5–4.6)

## 2020-03-29 MED ORDER — MAGNESIUM SULFATE 2 GM/50ML IV SOLN
2.0000 g | Freq: Once | INTRAVENOUS | Status: AC
  Administered 2020-03-29: 2 g via INTRAVENOUS
  Filled 2020-03-29: qty 50

## 2020-03-29 NOTE — Progress Notes (Signed)
Mobility Specialist - Progress Note   03/29/20 1033  Mobility  Activity Transferred:  Bed to chair  Level of Assistance Minimal assist, patient does 75% or more (mod. assist for sit-to-stand)  Assistive Device Front wheel walker  Distance Ambulated (ft) 5 ft  Mobility Response Tolerated well  Mobility performed by Mobility specialist  $Mobility charge 1 Mobility    Pre-mobility: 67 HR, 118/49 BP, 99% SpO2 Post-mobility: 61 HR, 108/41 BP, 100% SpO2  Pt was in bed with nurse present in room upon arrival. Pt agreed to session. Pt needed min. Assist getting to EOB. Pt was having a small amount of loose BM when getting to EOB. Pt needed mod. Assist sit-to-stand. BM was cleaned off pt once he stood up bedside. After getting cleaned, pt was able to ambulate to chair with min. Assist. Nurse and NT was notified. Pt was left in chair with call bell and phone in place.     Krishon Adkison Mobility Specialist  03/29/20, 10:42 AM

## 2020-03-29 NOTE — Plan of Care (Signed)
  Problem: Education: Goal: Knowledge of General Education information will improve Description: Including pain rating scale, medication(s)/side effects and non-pharmacologic comfort measures Outcome: Not Progressing   

## 2020-03-29 NOTE — Progress Notes (Signed)
PROGRESS NOTE    Jordan Perkins  CLE:[id number] DOB: March 23, 1941 DOA: 03/23/2020 PCP: [name], MD    Assessment & Plan:   Principal Problem:   Acute on chronic diastolic CHF (congestive heart failure) ([place]) Active Problems:   Atrial fibrillation ([place])   Hyperlipidemia   Essential hypertension   Normocytic anemia   Stage 3a chronic kidney disease   Coronary artery disease   Fall   Hypokalemia   Elevated troponin   Acute on chronic diastolic CHF: echo on 05/14/4966 showed EF 65%. Continue on lasix. Strict I/Os & daily weights. Fluid restriction. Neg fluid balance. Echo shows EF 60-65%, pulmonary HTN, unable to determine diastolic function. Will need close outpatient f/u  Likely metabolic encephalopathy: etiology unclear. Rapid response called on 03/24/20 as pt would not respond other than saying "hey" and would not respond to painful stimuli on 03/24/20. CT brain shows no acute intracranial findings. Back to baseline   CAD: s/p of CBAG. W/ elevated troponins, likely due to demand ischemia. No chest pain  Atrial fibrillation: likely PAF. Continue to hold metoprolol secondary to bradycardia. Hold Xarelto due to positive FOBT but H&H continues to trend down. Transfuse if Hb < 7. Will continue to monitor   Hyperlipidemia: continue on statin   Essential hypertension: hold metoprolol due to bradycardia. IV hydralazine prn   Macrocytic anemia: per ED physician, patient had a positive FOBT. Hold Xarelto. Will transfuse if Hb <7.  Continue on protonix.  CKDIIIa: Cr is stable. Will continue to monitor    Fall: CT head negative. PT/OT recs SNF.   Hypokalemia: WNL today. Will continue to monitor   Hypomagnesemia: Mg sulfate ordered. Will continue to monitor   Hypophosphatemia: phosphate repleted. Will continue to monitor   Thrombocytopenia: labile. Will continue to monitor   DVT prophylaxis: SCDs Code Status: full  Family Communication: called pt's brother again  each day but no answer and unable to leave a message Disposition Plan: likely d/c to SNF   Status is: Inpatient  Remains inpatient appropriate because: stable for d/c but pt's brother refused to take the pt home 03/27/20 stating he needed to go to SNF but initially pt and pt's brother were both refusing SNF. Today, pt's brother & neighbor came to visit the pt & I spoke with them both and everyone agreed including the pt that he needs to go to SNF    Dispo: The patient is from: Home              Anticipated d/c is to: SNF              Anticipated d/c date is: 3 days or couple of weeks waiting on auth from [place]               Patient currently is currently medically stable for d/c      Consultants:      Procedures:    Antimicrobials:    Subjective: Pt c/o weakness  Objective: Vitals:   03/28/20 1936 03/29/20 0533 03/29/20 0636 03/29/20 0721  BP: (!) 151/55 108/61  (!) 116/49  Pulse: 70 71  (!) 55  Resp: 20 20  17   Temp: (!) 100.9 F (38.3 C) (!) 101.7 F (38.7 C) 98.5 F (36.9 C) 98.9 F (37.2 C)  TempSrc: Oral Oral Oral Oral  SpO2: 100% 99%  98%  Weight:      Height:        Intake/Output Summary (Last 24 hours) at 03/29/2020 0805 Last data  filed at 03/29/2020 0246 Gross per 24 hour  Intake 720 ml  Output 775 ml  Net -55 ml   Filed Weights   03/24/20 0451 03/27/20 0328 03/28/20 0445  Weight: 78.9 kg 78.2 kg 78.6 kg    Examination:  General exam: Appears calm and comfortable  Respiratory system: diminished breath sounds b/l. No rhonchi Cardiovascular system: bradycardia. No rubs, gallops or clicks.  Gastrointestinal system: Abdomen is nondistended, soft and nontender.  Hypoactive bowel sounds heard. Central nervous system: Awake and oriented. Moves all 4 extremities  Psychiatry: Judgement and insight appear normal. Flat mood and affect    Data Reviewed: I have personally reviewed following labs and imaging studies  CBC: Recent Labs  Lab  03/25/20 0331 03/26/20 0352 03/27/20 0434 03/28/20 0302 03/29/20 0445  WBC 9.3 11.5* 9.5 8.0 5.6  HGB 9.0* 8.6* 8.5* 8.3* 8.0*  HCT 26.2* 24.5* 24.5* 24.7* 22.8*  MCV 100.8* 99.2 101.7* 104.2* 100.4*  PLT 166 143* 142* 151 903*   Basic Metabolic Panel: Recent Labs  Lab 03/25/20 0331 03/26/20 0352 03/27/20 0434 03/28/20 0302 03/29/20 0445  NA 140 136 136 134* 133*  K 3.1* 2.8* 3.9 3.9 3.8  CL 101 97* 100 102 100  CO2 30 29 28 27 28   GLUCOSE 142* 133* 155* 141* 148*  BUN 33* 27* 27* 27* 28*  CREATININE 1.15 1.11 1.23 1.02 1.11  CALCIUM 8.8* 8.4* 7.9* 7.8* 7.6*  MG 1.9 1.8 1.6* 1.9 1.6*  PHOS 3.4 2.7 1.9* 1.7* 2.1*   GFR: Estimated Creatinine Clearance: 60.3 mL/min (by C-G formula based on SCr of 1.11 mg/dL). Liver Function Tests: Recent Labs  Lab 03/23/20 0728  AST 30  ALT 17  ALKPHOS 66  BILITOT 2.4*  PROT 8.0  ALBUMIN 3.2*   No results for input(s): LIPASE, AMYLASE in the last 168 hours. No results for input(s): AMMONIA in the last 168 hours. Coagulation Profile: Recent Labs  Lab 03/23/20 1155  INR 2.3*   Cardiac Enzymes: No results for input(s): CKTOTAL, CKMB, CKMBINDEX, TROPONINI in the last 168 hours. BNP (last 3 results) No results for input(s): PROBNP in the last 8760 hours. HbA1C: No results for input(s): HGBA1C in the last 72 hours. CBG: Recent Labs  Lab 03/24/20 0722 03/24/20 1001 03/24/20 1119 03/24/20 1616 03/24/20 2108  GLUCAP 109* 116* 103* 134* 132*   Lipid Profile: No results for input(s): CHOL, HDL, LDLCALC, TRIG, CHOLHDL, LDLDIRECT in the last 72 hours. Thyroid Function Tests: No results for input(s): TSH, T4TOTAL, FREET4, T3FREE, THYROIDAB in the last 72 hours. Anemia Panel: No results for input(s): VITAMINB12, FOLATE, FERRITIN, TIBC, IRON, RETICCTPCT in the last 72 hours. Sepsis Labs: No results for input(s): PROCALCITON, LATICACIDVEN in the last 168 hours.  Recent Results (from the past 240 hour(s))  SARS Coronavirus 2  by RT PCR (hospital order, performed in [facility] hospital lab) Nasopharyngeal Nasopharyngeal Swab     Status: None   Collection Time: 03/23/20 11:55 AM   Specimen: Nasopharyngeal Swab  Result Value Ref Range Status   SARS Coronavirus 2 NEGATIVE NEGATIVE Final    Comment: (NOTE) SARS-CoV-2 target nucleic acids are NOT DETECTED.  The SARS-CoV-2 RNA is generally detectable in upper and lower respiratory specimens during the acute phase of infection. The lowest concentration of SARS-CoV-2 viral copies this assay can detect is 250 copies / mL. A negative result does not preclude SARS-CoV-2 infection and should not be used as the sole basis for treatment or other patient management decisions.  A negative result  may occur with improper specimen collection / handling, submission of specimen other than nasopharyngeal swab, presence of viral mutation(s) within the areas targeted by this assay, and inadequate number of viral copies (<250 copies / mL). A negative result must be combined with clinical observations, patient history, and epidemiological information.  Fact Sheet for Patients:   StrictlyIdeas.no  Fact Sheet for Healthcare Providers: BankingDealers.co.za  This test is not yet approved or  cleared by the [place] FDA and has been authorized for detection and/or diagnosis of SARS-CoV-2 by FDA under an Emergency Use Authorization (EUA).  This EUA will remain in effect (meaning this test can be used) for the duration of the COVID-19 declaration under Section 564(b)(1) of the Act, 21 U.S.C. section 360bbb-3(b)(1), unless the authorization is terminated or revoked sooner.  Performed at [facility], [address], [place], [place] [address]          Radiology Studies: No results found.      Scheduled Meds: . atorvastatin  80 mg Oral Daily  . Chlorhexidine Gluconate Cloth  6 each Topical Daily  . furosemide   40 mg Oral Daily  . lisinopril  40 mg Oral Daily  . pantoprazole  40 mg Oral BID  . potassium & sodium phosphates  1 packet Oral TID WC & HS  . sodium chloride flush  3 mL Intravenous Q12H   Continuous Infusions: . sodium chloride    . magnesium sulfate bolus IVPB       LOS: 6 days    Time spent: 30 mins     Wyvonnia Dusky, MD [facility] Pager [phone]  If 7PM-7AM, please contact night-coverage [email or web address] 03/29/2020, 8:05 AM

## 2020-03-30 ENCOUNTER — Other Ambulatory Visit: Payer: Self-pay | Admitting: Cardiovascular Disease

## 2020-03-30 DIAGNOSIS — I5033 Acute on chronic diastolic (congestive) heart failure: Secondary | ICD-10-CM | POA: Diagnosis not present

## 2020-03-30 LAB — PHOSPHORUS: Phosphorus: 2.1 mg/dL — ABNORMAL LOW (ref 2.5–4.6)

## 2020-03-30 LAB — CBC
HCT: 23.7 % — ABNORMAL LOW (ref 39.0–52.0)
Hemoglobin: 8.4 g/dL — ABNORMAL LOW (ref 13.0–17.0)
MCH: 35 pg — ABNORMAL HIGH (ref 26.0–34.0)
MCHC: 35.4 g/dL (ref 30.0–36.0)
MCV: 98.8 fL (ref 80.0–100.0)
Platelets: 118 10*3/uL — ABNORMAL LOW (ref 150–400)
RBC: 2.4 MIL/uL — ABNORMAL LOW (ref 4.22–5.81)
RDW: 14.8 % (ref 11.5–15.5)
WBC: 9.1 10*3/uL (ref 4.0–10.5)
nRBC: 0 % (ref 0.0–0.2)

## 2020-03-30 LAB — URINE CULTURE: Culture: 60000 — AB

## 2020-03-30 LAB — BASIC METABOLIC PANEL
Anion gap: 7 (ref 5–15)
BUN: 30 mg/dL — ABNORMAL HIGH (ref 8–23)
CO2: 25 mmol/L (ref 22–32)
Calcium: 7.7 mg/dL — ABNORMAL LOW (ref 8.9–10.3)
Chloride: 99 mmol/L (ref 98–111)
Creatinine, Ser: 1.26 mg/dL — ABNORMAL HIGH (ref 0.61–1.24)
GFR calc Af Amer: >60 mL/min (ref >60)
GFR calc non Af Amer: 55 mL/min — ABNORMAL LOW (ref >60)
Glucose, Bld: 146 mg/dL — ABNORMAL HIGH (ref 70–99)
Potassium: 4 mmol/L (ref 3.5–5.1)
Sodium: 131 mmol/L — ABNORMAL LOW (ref 135–145)

## 2020-03-30 LAB — MAGNESIUM: Magnesium: 1.9 mg/dL (ref 1.7–2.4)

## 2020-03-30 MED ORDER — POTASSIUM & SODIUM PHOSPHATES 280-160-250 MG PO PACK
1.0000 | PACK | Freq: Three times a day (TID) | ORAL | Status: DC
  Administered 2020-03-30 – 2020-04-01 (×8): 1 via ORAL
  Filled 2020-03-30 (×9): qty 1

## 2020-03-30 NOTE — Progress Notes (Signed)
PROGRESS NOTE    Jordan Perkins  TKW:[id number] DOB: 05-Dec-1940 DOA: 03/23/2020 PCP: [name], MD    Brief Narrative:  79 year old gentleman with history of hypertension, hyperlipidemia, A. fib on Xarelto, pulmonary artery hypertension, coronary artery disease, CABG, diastolic congestive heart failure, anemia, CKD stage III and possible dementia presents to the emergency room with multiple falls, generalized weakness and leg edema. In the emergency room, BNP 574, troponin XX 2/25, positive FOBT.  COVID-19 negative.  100% on room air.  CT scan abdomen pelvis essentially normal, small right pleural effusion, anasarca and ascites.   Assessment & Plan:   Principal Problem:   Acute on chronic diastolic CHF (congestive heart failure) ([facility]) Active Problems:   Atrial fibrillation ([facility])   Hyperlipidemia   Essential hypertension   Normocytic anemia   Stage 3a chronic kidney disease   Coronary artery disease   Fall   Hypokalemia   Elevated troponin  Acute on chronic diastolic congestive heart failure: Known ejection fraction 60 to 65%.  Pulmonary hypertension.  Fluid overload possibly due to diastolic dysfunction and pulmonary hypertension.  Patient was treated with IV Lasix with good diuresis and currently with euvolemia.  His creatinine is slightly elevated today. We will give him diuretic holiday.  Stop spironolactone.  Recheck levels tomorrow.  Acute metabolic encephalopathy: Etiology unclear.  Probable underlying dementia.  Nonfocal.  Now at baseline.  A. fib: Paroxysmal A. fib.  Patient on metoprolol and Xarelto at home.  Metoprolol discontinued because of concern about bradycardia.  Heart rate as low as 46.  Rate controlled.  Currently no indication for further rate control.  Xarelto discontinued due to positive FOBT, hemoglobin dropped from 10.5-8.5.  Due to frequent falls and debility, agree with holding Xarelto and treating symptomatically with Protonix and outpatient  follow-up to ensure a stable ED.  Macrocytic anemia: Recent two-point drop in hemoglobin.  Currently no active bleeding.  Empiric treatment with PPI and holding Xarelto.  Hyperlipidemia: On statin.  Hypokalemia/hypomagnesemia/hypophosphatemia: Replaced.  Adequate.  Advanced physical deconditioning: Due to multiple medical issues.   DVT prophylaxis: Place and maintain sequential compression device Start: 03/24/20 0828 SCDs Start: 03/23/20 1738   Code Status: Full code Family Communication: None today Disposition Plan: Status is: Inpatient  Remains inpatient appropriate because:Unsafe d/c plan   Dispo: The patient is from: Home              Anticipated d/c is to: SNF              Anticipated d/c date is: 1 day              Patient currently is medically stable to d/c.         Consultants:   None  Procedures:   None  Antimicrobials:   None   Subjective: Patient seen and examined.  He denied any complaints.  No family at the bedside.  Nursing reported no overnight events.  He mostly remains on room air.  Objective: Vitals:   03/29/20 1955 03/30/20 0515 03/30/20 0720 03/30/20 1136  BP: (!) 157/60 (!) 156/62 (!) 162/57 (!) 149/48  Pulse: 74 79 87 86  Resp: 17 19 (!) 24   Temp: 97.7 F (36.5 C) 98 F (36.7 C) 99 F (37.2 C) 99.9 F (37.7 C)  TempSrc: Oral Oral Oral Oral  SpO2: 100% 97% 97% 100%  Weight:  75.8 kg    Height:        Intake/Output Summary (Last 24 hours) at 03/30/2020 1441  Last data filed at 03/30/2020 1024 Gross per 24 hour  Intake 720 ml  Output 950 ml  Net -230 ml   Filed Weights   03/27/20 0328 03/28/20 0445 03/30/20 0515  Weight: 78.2 kg 78.6 kg 75.8 kg    Examination: Physical Exam Constitutional:      Comments: Calm and comfortable,   HENT:     Head: Normocephalic.  Eyes:     Pupils: Pupils are equal, round, and reactive to light.  Cardiovascular:     Rate and Rhythm: Regular rhythm.  Pulmonary:     Breath sounds:  Normal breath sounds.  Musculoskeletal:        General: No swelling or deformity.  Neurological:     Mental Status: He is alert.       Data Reviewed: I have personally reviewed following labs and imaging studies  CBC: Recent Labs  Lab 03/26/20 0352 03/27/20 0434 03/28/20 0302 03/29/20 0445 03/30/20 0446  WBC 11.5* 9.5 8.0 5.6 9.1  HGB 8.6* 8.5* 8.3* 8.0* 8.4*  HCT 24.5* 24.5* 24.7* 22.8* 23.7*  MCV 99.2 101.7* 104.2* 100.4* 98.8  PLT 143* 142* 151 128* 672*   Basic Metabolic Panel: Recent Labs  Lab 03/26/20 0352 03/27/20 0434 03/28/20 0302 03/29/20 0445 03/30/20 0446  NA 136 136 134* 133* 131*  K 2.8* 3.9 3.9 3.8 4.0  CL 97* 100 102 100 99  CO2 29 28 27 28 25   GLUCOSE 133* 155* 141* 148* 146*  BUN 27* 27* 27* 28* 30*  CREATININE 1.11 1.23 1.02 1.11 1.26*  CALCIUM 8.4* 7.9* 7.8* 7.6* 7.7*  MG 1.8 1.6* 1.9 1.6* 1.9  PHOS 2.7 1.9* 1.7* 2.1* 2.1*   GFR: Estimated Creatinine Clearance: 53.1 mL/min (A) (by C-G formula based on SCr of 1.26 mg/dL (H)). Liver Function Tests: No results for input(s): AST, ALT, ALKPHOS, BILITOT, PROT, ALBUMIN in the last 168 hours. No results for input(s): LIPASE, AMYLASE in the last 168 hours. No results for input(s): AMMONIA in the last 168 hours. Coagulation Profile: No results for input(s): INR, PROTIME in the last 168 hours. Cardiac Enzymes: No results for input(s): CKTOTAL, CKMB, CKMBINDEX, TROPONINI in the last 168 hours. BNP (last 3 results) No results for input(s): PROBNP in the last 8760 hours. HbA1C: No results for input(s): HGBA1C in the last 72 hours. CBG: Recent Labs  Lab 03/24/20 0722 03/24/20 1001 03/24/20 1119 03/24/20 1616 03/24/20 2108  GLUCAP 109* 116* 103* 134* 132*   Lipid Profile: No results for input(s): CHOL, HDL, LDLCALC, TRIG, CHOLHDL, LDLDIRECT in the last 72 hours. Thyroid Function Tests: No results for input(s): TSH, T4TOTAL, FREET4, T3FREE, THYROIDAB in the last 72 hours. Anemia Panel: No  results for input(s): VITAMINB12, FOLATE, FERRITIN, TIBC, IRON, RETICCTPCT in the last 72 hours. Sepsis Labs: No results for input(s): PROCALCITON, LATICACIDVEN in the last 168 hours.  Recent Results (from the past 240 hour(s))  SARS Coronavirus 2 by RT PCR (hospital order, performed in [facility] hospital lab) Nasopharyngeal Nasopharyngeal Swab     Status: None   Collection Time: 03/23/20 11:55 AM   Specimen: Nasopharyngeal Swab  Result Value Ref Range Status   SARS Coronavirus 2 NEGATIVE NEGATIVE Final    Comment: (NOTE) SARS-CoV-2 target nucleic acids are NOT DETECTED.  The SARS-CoV-2 RNA is generally detectable in upper and lower respiratory specimens during the acute phase of infection. The lowest concentration of SARS-CoV-2 viral copies this assay can detect is 250 copies / mL. A negative result does not preclude SARS-CoV-2 infection  and should not be used as the sole basis for treatment or other patient management decisions.  A negative result may occur with improper specimen collection / handling, submission of specimen other than nasopharyngeal swab, presence of viral mutation(s) within the areas targeted by this assay, and inadequate number of viral copies (<250 copies / mL). A negative result must be combined with clinical observations, patient history, and epidemiological information.  Fact Sheet for Patients:   StrictlyIdeas.no  Fact Sheet for Healthcare Providers: BankingDealers.co.za  This test is not yet approved or  cleared by the [place] FDA and has been authorized for detection and/or diagnosis of SARS-CoV-2 by FDA under an Emergency Use Authorization (EUA).  This EUA will remain in effect (meaning this test can be used) for the duration of the COVID-19 declaration under Section 564(b)(1) of the Act, 21 U.S.C. section 360bbb-3(b)(1), unless the authorization is terminated or revoked sooner.  Performed at  [facility], [address] [place], [place] [address]   Urine Culture     Status: Abnormal   Collection Time: 03/28/20 11:34 PM   Specimen: Urine, Random  Result Value Ref Range Status   Specimen Description   Final    URINE, RANDOM Performed at [facility], [address] [place], [place] [address]    Special Requests   Final    NONE Performed at [facility], [place], [place] [address]    Culture 60,000 COLONIES/mL ESCHERICHIA COLI (A)  Final   Report Status 03/30/2020 FINAL  Final   Organism ID, Bacteria ESCHERICHIA COLI (A)  Final      Susceptibility   Escherichia coli - MIC*    AMPICILLIN <=2 SENSITIVE Sensitive     CEFAZOLIN <=4 SENSITIVE Sensitive     CEFTRIAXONE <=0.25 SENSITIVE Sensitive     CIPROFLOXACIN <=0.25 SENSITIVE Sensitive     GENTAMICIN <=1 SENSITIVE Sensitive     IMIPENEM <=0.25 SENSITIVE Sensitive     NITROFURANTOIN <=16 SENSITIVE Sensitive     TRIMETH/SULFA <=20 SENSITIVE Sensitive     AMPICILLIN/SULBACTAM <=2 SENSITIVE Sensitive     PIP/TAZO <=4 SENSITIVE Sensitive     * 60,000 COLONIES/mL ESCHERICHIA COLI         Radiology Studies: No results found.      Scheduled Meds: . atorvastatin  80 mg Oral Daily  . Chlorhexidine Gluconate Cloth  6 each Topical Daily  . lisinopril  40 mg Oral Daily  . pantoprazole  40 mg Oral BID  . potassium & sodium phosphates  1 packet Oral TID WC & HS  . sodium chloride flush  3 mL Intravenous Q12H   Continuous Infusions: . sodium chloride       LOS: 7 days    Time spent: 25 minutes    Barb Merino, MD [facility] Pager [phone]

## 2020-03-30 NOTE — Progress Notes (Signed)
Physician was made aware, Tylenol was administered, will recheck in 1/2 hour.

## 2020-03-30 NOTE — Progress Notes (Signed)
Mobility Specialist - Progress Note   03/30/20 1142  Mobility  Activity Dangled on edge of bed  Level of Assistance Minimal assist, patient does 75% or more  Assistive Device Front wheel walker  Mobility Response Tolerated well  Mobility performed by Mobility specialist  $Mobility charge 1 Mobility   Pre-mobility: 88 HR, 151/45 BP, 99% SpO2   Pt was in bed upon arrival. Agreed to session. Pt needed min assist getting to EOB. Further mobility limited due to notice of IV bleed and BM incontinent episode. Nurse and NT was notified. Pt was helped back in bed with call bell and phone in reach.    Surya Schroeter Mobility Specialist  03/30/20, 11:57 AM

## 2020-03-30 NOTE — Telephone Encounter (Signed)
Attempted to schedule no ans no vm  

## 2020-03-30 NOTE — Progress Notes (Signed)
Patient still having liquid/watery stools.  At least 3 incontinent episode noted during day shift.   Each time patient was cleaned/peri care provided, and bedding/padding/gown changed.

## 2020-03-30 NOTE — Telephone Encounter (Signed)
Pt overdue for 6 month f/u. Last seen 12/2018. Please contact pt for future appointment.

## 2020-03-31 DIAGNOSIS — I5033 Acute on chronic diastolic (congestive) heart failure: Secondary | ICD-10-CM | POA: Diagnosis not present

## 2020-03-31 LAB — BASIC METABOLIC PANEL
Anion gap: 6 (ref 5–15)
BUN: 44 mg/dL — ABNORMAL HIGH (ref 8–23)
CO2: 28 mmol/L (ref 22–32)
Calcium: 7.5 mg/dL — ABNORMAL LOW (ref 8.9–10.3)
Chloride: 97 mmol/L — ABNORMAL LOW (ref 98–111)
Creatinine, Ser: 1.44 mg/dL — ABNORMAL HIGH (ref 0.61–1.24)
GFR calc Af Amer: 54 mL/min — ABNORMAL LOW (ref >60)
GFR calc non Af Amer: 47 mL/min — ABNORMAL LOW (ref >60)
Glucose, Bld: 114 mg/dL — ABNORMAL HIGH (ref 70–99)
Potassium: 3.2 mmol/L — ABNORMAL LOW (ref 3.5–5.1)
Sodium: 131 mmol/L — ABNORMAL LOW (ref 135–145)

## 2020-03-31 MED ORDER — POTASSIUM CHLORIDE CRYS ER 20 MEQ PO TBCR
40.0000 meq | EXTENDED_RELEASE_TABLET | Freq: Two times a day (BID) | ORAL | Status: AC
  Administered 2020-03-31 – 2020-04-01 (×4): 40 meq via ORAL
  Filled 2020-03-31 (×4): qty 2

## 2020-03-31 NOTE — Progress Notes (Signed)
Physical Therapy Treatment Patient Details Name: Jordan Perkins MRN: [id number] DOB: 11-22-1940 Today's Date: 03/31/2020    History of Present Illness Jordan Perkins is a 79 y.o. male with medical history significant of hypertension, hyperlipidemia, atrial fibrillation on Xarelto, PAH, CAD, CABG, dCHF, anemia, PAD, gallstone pancreatitis, CKD-3, possible dementia, who presents with multiple falls, generalized weakness and leg edema; admitted for management of acute/chronic CHF    PT Comments    Pt was asleep in long sitting upon arriving. He easily awakes and agrees to trial OOB/PT session however was very limited by incontinence episodes. Pt was able to exit L side of bed with min assist. Stood 2 x EOB prior to standing and taking 3 steps to [facility] with min assist. Pt unable to control bowls and has several loose stools throughout. Once seated on BSC, pt request to stay seated for awhile to try to move more bowls. Rn tech notified and she reports he has had BMs earlier this date. Pt has call bell in hand while seated on BSC at conclusion of session. Progress very limited by GI condition. Pt will benefit form SNF to address strength, balance, and safe functional mobility.        Follow Up Recommendations  SNF     Equipment Recommendations  Rolling walker with 5" wheels;3in1 (PT)    Recommendations for Other Services       Precautions / Restrictions Precautions Precautions: Fall Restrictions Weight Bearing Restrictions: No    Mobility  Bed Mobility Overal bed mobility: Needs Assistance Bed Mobility: Supine to Sit     Supine to sit: Min assist     General bed mobility comments: Min assist today to achieve EOB short sit. pt reports feeling very weak/fatigued from lack of mobility.  Transfers Overall transfer level: Needs assistance Equipment used: Rolling walker (2 wheeled) Transfers: Sit to/from Stand Sit to Stand: Min assist         General transfer comment: pt  has poor fwd wt shift during STS transfers. Vcs for correct handplacement and wt shift. pt performed STS 2 x EOB prior to standing and taking steps to [facility]. session very limited by mulitple BMs. pt is incontinent of bowls. Vitals were stable throughout.  Ambulation/Gait Ambulation/Gait assistance: Min assist Gait Distance (Feet): 3 Feet Assistive device: Rolling walker (2 wheeled) Gait Pattern/deviations: Step-to pattern Gait velocity: decreased   General Gait Details: unable to progress ambulation further distances 2/2 to multiple Bms upon standing. pt requested to sit on [facility] for awhile. RN tech notified and pt holding call bell in hand.    Stairs             Wheelchair Mobility    Modified Rankin (Stroke Patients Only)       Balance Overall balance assessment: Needs assistance Sitting-balance support: No upper extremity supported;Feet supported Sitting balance-Leahy Scale: Good Sitting balance - Comments: pt able to maintain balance while seated EOB without LOB or unsteadiness   Standing balance support: Bilateral upper extremity supported Standing balance-Leahy Scale: Poor Standing balance comment: min assist during functional activity to prevent LOB. heavy reliance on RW/UEs for support                            Cognition Arousal/Alertness: Awake/alert Behavior During Therapy: Flat affect Overall Cognitive Status: No family/caregiver present to determine baseline cognitive functioning  General Comments: Pt was Alert and oriented x 3. He has poor insight of deficits and situation however able to follow commands consistently throughout. session limited by multiple BMs.      Exercises      General Comments        Pertinent Vitals/Pain Pain Assessment: No/denies pain Faces Pain Scale: No hurt    Home Living                      Prior Function            PT Goals (current goals can now be  found in the care plan section) Acute Rehab PT Goals Patient Stated Goal: To return home Progress towards PT goals: Progressing toward goals    Frequency    Min 2X/week      PT Plan Current plan remains appropriate    Co-evaluation              AM-PAC PT "6 Clicks" Mobility   Outcome Measure  Help needed turning from your back to your side while in a flat bed without using bedrails?: None Help needed moving from lying on your back to sitting on the side of a flat bed without using bedrails?: None Help needed moving to and from a bed to a chair (including a wheelchair)?: A Little Help needed standing up from a chair using your arms (e.g., wheelchair or bedside chair)?: A Little Help needed to walk in hospital room?: A Little Help needed climbing 3-5 steps with a railing? : A Little 6 Click Score: 20    End of Session Equipment Utilized During Treatment: Gait belt Activity Tolerance: Patient tolerated treatment well Patient left: with call bell/phone within reach;with nursing/sitter in room (Pt was seated on BSC at conclusion of session.) Nurse Communication: Mobility status PT Visit Diagnosis: Muscle weakness (generalized) (M62.81);Difficulty in walking, not elsewhere classified (R26.2)     Time: 1100-1120 PT Time Calculation (min) (ACUTE ONLY): 20 min  Charges:  $Therapeutic Activity: 8-22 mins                       Julaine Fusi PTA 03/31/20, 12:21 PM

## 2020-03-31 NOTE — Progress Notes (Signed)
PROGRESS NOTE    Jordan Perkins  MIW:[id number] DOB: [birth year] DOA: 03/23/2020 PCP: [name], MD    Brief Narrative:  79 year old gentleman with history of hypertension, hyperlipidemia, A. fib on Xarelto, pulmonary artery hypertension, coronary artery disease, CABG, diastolic congestive heart failure, anemia, CKD stage III and possible dementia presents to the emergency room with multiple falls, generalized weakness and leg edema. In the emergency room, BNP 574, troponin XX 2/25, positive FOBT.  COVID-19 negative.  100% on room air.  CT scan abdomen pelvis essentially normal, small right pleural effusion, anasarca and ascites. 7/21, an episode of fever with no defined source.  7/19 E. coli UTI less than 100,000 colonies not treated.  Patient asymptomatic.   Assessment & Plan:   Principal Problem:   Acute on chronic diastolic CHF (congestive heart failure) (HCC) Active Problems:   Atrial fibrillation (HCC)   Hyperlipidemia   Essential hypertension   Normocytic anemia   Stage 3a chronic kidney disease   Coronary artery disease   Fall   Hypokalemia   Elevated troponin  Acute on chronic diastolic congestive heart failure: Known ejection fraction 60 to 65%.  Pulmonary hypertension.  Fluid overload possibly due to diastolic dysfunction and pulmonary hypertension.  Patient was treated with IV Lasix with good diuresis and currently with euvolemia.  His creatinine is elevated. We will give him diuretic holiday.  Stop spironolactone.  Recheck levels tomorrow.  Acute metabolic encephalopathy: Etiology unclear.  Probable underlying dementia.  Nonfocal.  Now at baseline.  A. fib: Paroxysmal A. fib.  Patient on metoprolol and Xarelto at home.  Metoprolol discontinued because of concern about bradycardia.  Heart rate as low as 46.  Rate controlled.  Currently no indication for further rate control.  Xarelto discontinued due to positive FOBT, hemoglobin dropped from 10.5-8.5.  Due  to frequent falls and debility, agree with holding Xarelto and treating symptomatically with Protonix and outpatient follow-up to ensure stability.  Macrocytic anemia: Recent two-point drop in hemoglobin.  Currently no active bleeding.  Empiric treatment with PPI and holding Xarelto.  Hyperlipidemia: On statin.  Hypokalemia/hypomagnesemia/hypophosphatemia: Replaced.  Replace further.  Advanced physical deconditioning: Due to multiple medical issues.  Refer to a skilled nursing facility rehab.   DVT prophylaxis: Place and maintain sequential compression device Start: 03/24/20 0828 SCDs Start: 03/23/20 1738   Code Status: Full code Family Communication: None today Disposition Plan: Status is: Inpatient  Remains inpatient appropriate because:Unsafe d/c plan   Dispo: The patient is from: Home              Anticipated d/c is to: SNF              Anticipated d/c date is: 1 day              Patient currently is medically stable to d/c.  Only to skilled level of care.         Consultants:   None  Procedures:   None  Antimicrobials:   None   Subjective: Patient was seen and examined.  Eating breakfast.  No overnight events.  Yesterday evening he had an episode of temperature 102, given Tylenol and blood cultures were drawn.  No more fever.  No localizing symptoms.  No urinary symptoms. Poor historian, he knows he is waiting to go to a skilled nursing facility as his brother not able to help him at home.  Objective: Vitals:   03/30/20 1858 03/30/20 1925 03/31/20 0358 03/31/20 0722  BP: (!) 112/49 Marland Kitchen)  109/38 (!) 101/43 (!) 113/47  Pulse: 67 64 (!) 49 (!) 58  Resp: 19 20 18    Temp: 99.2 F (37.3 C) 98.9 F (37.2 C) 98.1 F (36.7 C) 98.3 F (36.8 C)  TempSrc: Oral Oral  Oral  SpO2: 99% 97% 100% 99%  Weight:      Height:        Intake/Output Summary (Last 24 hours) at 03/31/2020 1100 Last data filed at 03/31/2020 1016 Gross per 24 hour  Intake 240 ml  Output  1875 ml  Net -1635 ml   Filed Weights   03/27/20 0328 03/28/20 0445 03/30/20 0515  Weight: 78.2 kg 78.6 kg 75.8 kg    Examination: Physical Exam Constitutional:      Comments: Calm and comfortable,   HENT:     Head: Normocephalic.  Eyes:     Pupils: Pupils are equal, round, and reactive to light.  Cardiovascular:     Rate and Rhythm: Regular rhythm.  Pulmonary:     Breath sounds: Normal breath sounds.  Musculoskeletal:        General: No swelling or deformity.  Neurological:     Mental Status: He is alert.   No peripheral edema.    Data Reviewed: I have personally reviewed following labs and imaging studies  CBC: Recent Labs  Lab 03/26/20 0352 03/27/20 0434 03/28/20 0302 03/29/20 0445 03/30/20 0446  WBC 11.5* 9.5 8.0 5.6 9.1  HGB 8.6* 8.5* 8.3* 8.0* 8.4*  HCT 24.5* 24.5* 24.7* 22.8* 23.7*  MCV 99.2 101.7* 104.2* 100.4* 98.8  PLT 143* 142* 151 128* 314*   Basic Metabolic Panel: Recent Labs  Lab 03/26/20 0352 03/26/20 0352 03/27/20 0434 03/28/20 0302 03/29/20 0445 03/30/20 0446 03/31/20 0519  NA 136   < > 136 134* 133* 131* 131*  K 2.8*   < > 3.9 3.9 3.8 4.0 3.2*  CL 97*   < > 100 102 100 99 97*  CO2 29   < > 28 27 28 25 28   GLUCOSE 133*   < > 155* 141* 148* 146* 114*  BUN 27*   < > 27* 27* 28* 30* 44*  CREATININE 1.11   < > 1.23 1.02 1.11 1.26* 1.44*  CALCIUM 8.4*   < > 7.9* 7.8* 7.6* 7.7* 7.5*  MG 1.8  --  1.6* 1.9 1.6* 1.9  --   PHOS 2.7  --  1.9* 1.7* 2.1* 2.1*  --    < > = values in this interval not displayed.   GFR: Estimated Creatinine Clearance: 46.5 mL/min (A) (by C-G formula based on SCr of 1.44 mg/dL (H)). Liver Function Tests: No results for input(s): AST, ALT, ALKPHOS, BILITOT, PROT, ALBUMIN in the last 168 hours. No results for input(s): LIPASE, AMYLASE in the last 168 hours. No results for input(s): AMMONIA in the last 168 hours. Coagulation Profile: No results for input(s): INR, PROTIME in the last 168 hours. Cardiac  Enzymes: No results for input(s): CKTOTAL, CKMB, CKMBINDEX, TROPONINI in the last 168 hours. BNP (last 3 results) No results for input(s): PROBNP in the last 8760 hours. HbA1C: No results for input(s): HGBA1C in the last 72 hours. CBG: Recent Labs  Lab 03/24/20 1119 03/24/20 1616 03/24/20 2108  GLUCAP 103* 134* 132*   Lipid Profile: No results for input(s): CHOL, HDL, LDLCALC, TRIG, CHOLHDL, LDLDIRECT in the last 72 hours. Thyroid Function Tests: No results for input(s): TSH, T4TOTAL, FREET4, T3FREE, THYROIDAB in the last 72 hours. Anemia Panel: No results for input(s): VITAMINB12, FOLATE,  FERRITIN, TIBC, IRON, RETICCTPCT in the last 72 hours. Sepsis Labs: No results for input(s): PROCALCITON, LATICACIDVEN in the last 168 hours.  Recent Results (from the past 240 hour(s))  SARS Coronavirus 2 by RT PCR (hospital order, performed in [facility] hospital lab) Nasopharyngeal Nasopharyngeal Swab     Status: None   Collection Time: 03/23/20 11:55 AM   Specimen: Nasopharyngeal Swab  Result Value Ref Range Status   SARS Coronavirus 2 NEGATIVE NEGATIVE Final    Comment: (NOTE) SARS-CoV-2 target nucleic acids are NOT DETECTED.  The SARS-CoV-2 RNA is generally detectable in upper and lower respiratory specimens during the acute phase of infection. The lowest concentration of SARS-CoV-2 viral copies this assay can detect is 250 copies / mL. A negative result does not preclude SARS-CoV-2 infection and should not be used as the sole basis for treatment or other patient management decisions.  A negative result may occur with improper specimen collection / handling, submission of specimen other than nasopharyngeal swab, presence of viral mutation(s) within the areas targeted by this assay, and inadequate number of viral copies (<250 copies / mL). A negative result must be combined with clinical observations, patient history, and epidemiological information.  Fact Sheet for Patients:    StrictlyIdeas.no  Fact Sheet for Healthcare Providers: BankingDealers.co.za  This test is not yet approved or  cleared by the [place] FDA and has been authorized for detection and/or diagnosis of SARS-CoV-2 by FDA under an Emergency Use Authorization (EUA).  This EUA will remain in effect (meaning this test can be used) for the duration of the COVID-19 declaration under Section 564(b)(1) of the Act, 21 U.S.C. section 360bbb-3(b)(1), unless the authorization is terminated or revoked sooner.  Performed at [facility], [address] [place], [place] [address]   Urine Culture     Status: Abnormal   Collection Time: 03/28/20 11:34 PM   Specimen: Urine, Random  Result Value Ref Range Status   Specimen Description   Final    URINE, RANDOM Performed at [facility], [address] [place],  [address]    Special Requests   Final    NONE Performed at [facility], [place], [place] [address]    Culture 60,000 COLONIES/mL ESCHERICHIA COLI (A)  Final   Report Status 03/30/2020 FINAL  Final   Organism ID, Bacteria ESCHERICHIA COLI (A)  Final      Susceptibility   Escherichia coli - MIC*    AMPICILLIN <=2 SENSITIVE Sensitive     CEFAZOLIN <=4 SENSITIVE Sensitive     CEFTRIAXONE <=0.25 SENSITIVE Sensitive     CIPROFLOXACIN <=0.25 SENSITIVE Sensitive     GENTAMICIN <=1 SENSITIVE Sensitive     IMIPENEM <=0.25 SENSITIVE Sensitive     NITROFURANTOIN <=16 SENSITIVE Sensitive     TRIMETH/SULFA <=20 SENSITIVE Sensitive     AMPICILLIN/SULBACTAM <=2 SENSITIVE Sensitive     PIP/TAZO <=4 SENSITIVE Sensitive     * 60,000 COLONIES/mL ESCHERICHIA COLI         Radiology Studies: No results found.      Scheduled Meds: . atorvastatin  80 mg Oral Daily  . Chlorhexidine Gluconate Cloth  6 each Topical Daily  . lisinopril  40 mg Oral Daily  . pantoprazole  40 mg Oral BID  .  potassium & sodium phosphates  1 packet Oral TID WC & HS  . potassium chloride  40 mEq Oral BID  . sodium chloride flush  3 mL Intravenous Q12H   Continuous  Infusions: . sodium chloride       LOS: 8 days    Time spent: 25 minutes    Barb Merino, MD [facility] Pager [phone]

## 2020-03-31 NOTE — Care Management Important Message (Signed)
Important Message  Patient Details  Name: Nova Evett MRN: [id number] Date of Birth: 01/04/1941   Medicare Important Message Given:  Yes     [name] 03/31/2020, 1:31 PM

## 2020-04-01 DIAGNOSIS — I5033 Acute on chronic diastolic (congestive) heart failure: Secondary | ICD-10-CM | POA: Diagnosis not present

## 2020-04-01 LAB — BASIC METABOLIC PANEL
Anion gap: 11 (ref 5–15)
BUN: 55 mg/dL — ABNORMAL HIGH (ref 8–23)
CO2: 24 mmol/L (ref 22–32)
Calcium: 7.8 mg/dL — ABNORMAL LOW (ref 8.9–10.3)
Chloride: 98 mmol/L (ref 98–111)
Creatinine, Ser: 1.53 mg/dL — ABNORMAL HIGH (ref 0.61–1.24)
GFR calc Af Amer: 50 mL/min — ABNORMAL LOW (ref >60)
GFR calc non Af Amer: 44 mL/min — ABNORMAL LOW (ref >60)
Glucose, Bld: 116 mg/dL — ABNORMAL HIGH (ref 70–99)
Potassium: 3.9 mmol/L (ref 3.5–5.1)
Sodium: 133 mmol/L — ABNORMAL LOW (ref 135–145)

## 2020-04-01 LAB — PHOSPHORUS: Phosphorus: 3.4 mg/dL (ref 2.5–4.6)

## 2020-04-01 NOTE — TOC Progression Note (Signed)
Transition of Care [facility]) - Progression Note    Patient Details  Name: Jordan Perkins MRN: [id number] Date of Birth: Jan 30, 1941  Transition of Care [facility]) CM/SW Contact  Eileen Stanford, LCSW Phone Number: 04/01/2020, 1:20 PM  Clinical Narrative:   [facility] denied SNF placement. Appeal information provided to MD. Per MD after speaking with [facility] they are requesting CSW send updated clinicals for expedited appeal. Clinicals faxed to [facility].         Expected Discharge Plan and Services           Expected Discharge Date: 03/27/20                                     Social Determinants of Health (SDOH) Interventions    Readmission Risk Interventions No flowsheet data found.

## 2020-04-01 NOTE — Progress Notes (Signed)
Mobility Specialist - Progress Note   04/01/20 1200  Mobility  Activity Ambulated in room;Transferred:  Bed to chair  Level of Assistance Moderate assist, patient does 50-74%  Assistive Device Front wheel walker  Distance Ambulated (ft) 25 ft  Mobility Response Tolerated well  Mobility performed by Mobility specialist  $Mobility charge 1 Mobility    Pre-mobility: 60 HR, 116/47 BP, 100% SpO2 During mobility: 84 HR, 97% SpO2 Post-mobility: 67 HR, 119/43 BP, 100% SpO2  Pt was lying in bed upon arrival. Agreed to ambulate in room w/ RW (bed-recliner-chair-recliner) 25' total. Pt was mod assist from sit-to-stand. No dizziness or LOB during ambulation. Pt was cued for controlled sitting as pt would rush to sit. Pt was cued to keep RW close to body during ambulation. Overall tolerated well. Pt ended session in recliner. Call bell and phone in reach. Nurse was notified of performance.   Kathee Delton Mobility Specialist 04/01/20, 12:47 PM

## 2020-04-01 NOTE — Progress Notes (Signed)
PROGRESS NOTE    Jordan Perkins  WNU:[id number] DOB: [birth year] DOA: 03/23/2020 PCP: [name], MD    Brief Narrative:  79 year old gentleman with history of hypertension, hyperlipidemia, A. fib on Xarelto, pulmonary artery hypertension, coronary artery disease, CABG, diastolic congestive heart failure, anemia, CKD stage III and possible dementia presents to the emergency room with multiple falls, generalized weakness and leg edema. In the emergency room, BNP 574, troponin XX 2/25, positive FOBT.  COVID-19 negative.  100% on room air.  CT scan abdomen pelvis essentially normal, small right pleural effusion, anasarca and ascites.  7/21, an episode of fever with no defined source.  7/19 E. coli UTI less than 100,000 colonies not treated.  Patient asymptomatic. 7/23, waiting to go to skilled nursing facility.  His mentation has improved.  He has physical deconditioning and will benefit with short-term rehab before going back to live with his brother.  7/23, peer to peer review done with insurance provider physician.  Initially reported as altered mentation and possibly needing long-term care, however patient's mentation has improved and his brother will help him go back home after short-term rehab.  Discussed with insurance physician that patient is appropriate candidate for short-term rehab before returning to independent living with family help.  Doing expedited appeal.   Assessment & Plan:   Principal Problem:   Acute on chronic diastolic CHF (congestive heart failure) (HCC) Active Problems:   Atrial fibrillation (HCC)   Hyperlipidemia   Essential hypertension   Normocytic anemia   Stage 3a chronic kidney disease   Coronary artery disease   Fall   Hypokalemia   Elevated troponin  Acute on chronic diastolic congestive heart failure: Known ejection fraction 60 to 65%.  Pulmonary hypertension.  Fluid overload possibly due to diastolic dysfunction and pulmonary  hypertension.  Patient was treated with IV Lasix with good diuresis and currently with euvolemia.  His creatinine is elevated. Holding Lasix and lisinopril today.  Holding Aldactone.  We will give him diuretic holiday. Recheck levels tomorrow.  Acute metabolic encephalopathy: Etiology unclear.  Probable underlying dementia.  Nonfocal.  Now at baseline. Mentation has improved.  He is ANO x3.  A. fib: Paroxysmal A. fib.  Patient on metoprolol and Xarelto at home.  Metoprolol discontinued because of concern about bradycardia.  Heart rate as low as 46.  Rate controlled.  Currently no indication for further rate control.  Xarelto discontinued due to positive FOBT, hemoglobin dropped from 10.5-8.5.  Due to frequent falls and debility, agree with holding Xarelto and treating symptomatically with Protonix and outpatient follow-up to ensure stability.  Macrocytic anemia: Recent two-point drop in hemoglobin.  Currently no active bleeding.  Empiric treatment with PPI and holding Xarelto.  We will recheck tomorrow.  Hyperlipidemia: On statin.  Hypokalemia/hypomagnesemia/hypophosphatemia: Replaced.  On a schedule replacement.  We will recheck tomorrow.  Advanced physical deconditioning: Due to multiple medical issues.  Refer to a skilled nursing facility rehab. Waiting for expediently to appeal.   DVT prophylaxis: Place and maintain sequential compression device Start: 03/24/20 0828 SCDs Start: 03/23/20 1738   Code Status: Full code Family Communication: None. Disposition Plan: Status is: Inpatient  Remains inpatient appropriate because:Unsafe d/c plan   Dispo: The patient is from: Home              Anticipated d/c is to: SNF              Anticipated d/c date is: 1 day  Patient currently is medically stable to d/c.  Only to skilled level of care.         Consultants:   None  Procedures:   None  Antimicrobials:   None   Subjective: Patient seen and examined.   Denies any complaints.  Afebrile last 24 hours.  Objective: Vitals:   04/01/20 0013 04/01/20 0426 04/01/20 0813 04/01/20 1159  BP: (!) 115/45 (!) 121/40 (!) 124/50 (!) 102/45  Pulse: 59 65 60 46  Resp: 20 16 16 16   Temp: 99.3 F (37.4 C) 98.4 F (36.9 C) 98.3 F (36.8 C) 98.2 F (36.8 C)  TempSrc: Oral  Oral Oral  SpO2: 98% 99% 98% 100%  Weight:      Height:        Intake/Output Summary (Last 24 hours) at 04/01/2020 1240 Last data filed at 04/01/2020 0955 Gross per 24 hour  Intake 360 ml  Output 550 ml  Net -190 ml   Filed Weights   03/27/20 0328 03/28/20 0445 03/30/20 0515  Weight: 78.2 kg 78.6 kg 75.8 kg    Examination: Physical Exam Constitutional:      Comments: Calm and comfortable,   HENT:     Head: Normocephalic.  Eyes:     Pupils: Pupils are equal, round, and reactive to light.  Cardiovascular:     Rate and Rhythm: Regular rhythm.  Pulmonary:     Breath sounds: Normal breath sounds.  Musculoskeletal:        General: No swelling or deformity.  Neurological:     Mental Status: He is alert.   No peripheral edema.  Patient is alert oriented x3.    Data Reviewed: I have personally reviewed following labs and imaging studies  CBC: Recent Labs  Lab 03/26/20 0352 03/27/20 0434 03/28/20 0302 03/29/20 0445 03/30/20 0446  WBC 11.5* 9.5 8.0 5.6 9.1  HGB 8.6* 8.5* 8.3* 8.0* 8.4*  HCT 24.5* 24.5* 24.7* 22.8* 23.7*  MCV 99.2 101.7* 104.2* 100.4* 98.8  PLT 143* 142* 151 128* 024*   Basic Metabolic Panel: Recent Labs  Lab 03/26/20 0352 03/26/20 0352 03/27/20 0434 03/27/20 0434 03/28/20 0302 03/29/20 0445 03/30/20 0446 03/31/20 0519 04/01/20 0434  NA 136   < > 136   < > 134* 133* 131* 131* 133*  K 2.8*   < > 3.9   < > 3.9 3.8 4.0 3.2* 3.9  CL 97*   < > 100   < > 102 100 99 97* 98  CO2 29   < > 28   < > 27 28 25 28 24   GLUCOSE 133*   < > 155*   < > 141* 148* 146* 114* 116*  BUN 27*   < > 27*   < > 27* 28* 30* 44* 55*  CREATININE 1.11   < > 1.23    < > 1.02 1.11 1.26* 1.44* 1.53*  CALCIUM 8.4*   < > 7.9*   < > 7.8* 7.6* 7.7* 7.5* 7.8*  MG 1.8  --  1.6*  --  1.9 1.6* 1.9  --   --   PHOS 2.7   < > 1.9*  --  1.7* 2.1* 2.1*  --  3.4   < > = values in this interval not displayed.   GFR: Estimated Creatinine Clearance: 43.7 mL/min (A) (by C-G formula based on SCr of 1.53 mg/dL (H)). Liver Function Tests: No results for input(s): AST, ALT, ALKPHOS, BILITOT, PROT, ALBUMIN in the last 168 hours. No results for input(s): LIPASE,  AMYLASE in the last 168 hours. No results for input(s): AMMONIA in the last 168 hours. Coagulation Profile: No results for input(s): INR, PROTIME in the last 168 hours. Cardiac Enzymes: No results for input(s): CKTOTAL, CKMB, CKMBINDEX, TROPONINI in the last 168 hours. BNP (last 3 results) No results for input(s): PROBNP in the last 8760 hours. HbA1C: No results for input(s): HGBA1C in the last 72 hours. CBG: No results for input(s): GLUCAP in the last 168 hours. Lipid Profile: No results for input(s): CHOL, HDL, LDLCALC, TRIG, CHOLHDL, LDLDIRECT in the last 72 hours. Thyroid Function Tests: No results for input(s): TSH, T4TOTAL, FREET4, T3FREE, THYROIDAB in the last 72 hours. Anemia Panel: No results for input(s): VITAMINB12, FOLATE, FERRITIN, TIBC, IRON, RETICCTPCT in the last 72 hours. Sepsis Labs: No results for input(s): PROCALCITON, LATICACIDVEN in the last 168 hours.  Recent Results (from the past 240 hour(s))  SARS Coronavirus 2 by RT PCR (hospital order, performed in [facility] hospital lab) Nasopharyngeal Nasopharyngeal Swab     Status: None   Collection Time: 03/23/20 11:55 AM   Specimen: Nasopharyngeal Swab  Result Value Ref Range Status   SARS Coronavirus 2 NEGATIVE NEGATIVE Final    Comment: (NOTE) SARS-CoV-2 target nucleic acids are NOT DETECTED.  The SARS-CoV-2 RNA is generally detectable in upper and lower respiratory specimens during the acute phase of infection. The  lowest concentration of SARS-CoV-2 viral copies this assay can detect is 250 copies / mL. A negative result does not preclude SARS-CoV-2 infection and should not be used as the sole basis for treatment or other patient management decisions.  A negative result may occur with improper specimen collection / handling, submission of specimen other than nasopharyngeal swab, presence of viral mutation(s) within the areas targeted by this assay, and inadequate number of viral copies (<250 copies / mL). A negative result must be combined with clinical observations, patient history, and epidemiological information.  Fact Sheet for Patients:   StrictlyIdeas.no  Fact Sheet for Healthcare Providers: BankingDealers.co.za  This test is not yet approved or  cleared by the [place] FDA and has been authorized for detection and/or diagnosis of SARS-CoV-2 by FDA under an Emergency Use Authorization (EUA).  This EUA will remain in effect (meaning this test can be used) for the duration of the COVID-19 declaration under Section 564(b)(1) of the Act, 21 U.S.C. section 360bbb-3(b)(1), unless the authorization is terminated or revoked sooner.  Performed at [facility], [address], [place], [place] [address]   Urine Culture     Status: Abnormal   Collection Time: 03/28/20 11:34 PM   Specimen: Urine, Random  Result Value Ref Range Status   Specimen Description   Final    URINE, RANDOM Performed at [facility], [address] [place], [place] [address]    Special Requests   Final    NONE Performed at [facility], [place], [place] [address]    Culture 60,000 COLONIES/mL ESCHERICHIA COLI (A)  Final   Report Status 03/30/2020 FINAL  Final   Organism ID, Bacteria ESCHERICHIA COLI (A)  Final      Susceptibility   Escherichia coli - MIC*    AMPICILLIN <=2 SENSITIVE Sensitive     CEFAZOLIN <=4  SENSITIVE Sensitive     CEFTRIAXONE <=0.25 SENSITIVE Sensitive     CIPROFLOXACIN <=0.25 SENSITIVE Sensitive     GENTAMICIN <=1 SENSITIVE Sensitive     IMIPENEM <=0.25 SENSITIVE Sensitive     NITROFURANTOIN <=16 SENSITIVE Sensitive  TRIMETH/SULFA <=20 SENSITIVE Sensitive     AMPICILLIN/SULBACTAM <=2 SENSITIVE Sensitive     PIP/TAZO <=4 SENSITIVE Sensitive     * 60,000 COLONIES/mL ESCHERICHIA COLI         Radiology Studies: No results found.      Scheduled Meds:  atorvastatin  80 mg Oral Daily   Chlorhexidine Gluconate Cloth  6 each Topical Daily   pantoprazole  40 mg Oral BID   potassium chloride  40 mEq Oral BID   sodium chloride flush  3 mL Intravenous Q12H   Continuous Infusions:  sodium chloride       LOS: 9 days    Time spent: 25 minutes    Barb Merino, MD [facility] Pager [phone]

## 2020-04-02 DIAGNOSIS — I5033 Acute on chronic diastolic (congestive) heart failure: Secondary | ICD-10-CM | POA: Diagnosis not present

## 2020-04-02 LAB — BASIC METABOLIC PANEL
Anion gap: 6 (ref 5–15)
BUN: 53 mg/dL — ABNORMAL HIGH (ref 8–23)
CO2: 25 mmol/L (ref 22–32)
Calcium: 8 mg/dL — ABNORMAL LOW (ref 8.9–10.3)
Chloride: 101 mmol/L (ref 98–111)
Creatinine, Ser: 1.36 mg/dL — ABNORMAL HIGH (ref 0.61–1.24)
GFR calc Af Amer: 58 mL/min — ABNORMAL LOW (ref >60)
GFR calc non Af Amer: 50 mL/min — ABNORMAL LOW (ref >60)
Glucose, Bld: 114 mg/dL — ABNORMAL HIGH (ref 70–99)
Potassium: 5 mmol/L (ref 3.5–5.1)
Sodium: 132 mmol/L — ABNORMAL LOW (ref 135–145)

## 2020-04-02 LAB — CBC WITH DIFFERENTIAL/PLATELET
Abs Immature Granulocytes: 0.05 10*3/uL (ref 0.00–0.07)
Basophils Absolute: 0 10*3/uL (ref 0.0–0.1)
Basophils Relative: 1 %
Eosinophils Absolute: 0.3 10*3/uL (ref 0.0–0.5)
Eosinophils Relative: 4 %
HCT: 22.8 % — ABNORMAL LOW (ref 39.0–52.0)
Hemoglobin: 7.7 g/dL — ABNORMAL LOW (ref 13.0–17.0)
Immature Granulocytes: 1 %
Lymphocytes Relative: 10 %
Lymphs Abs: 0.8 10*3/uL (ref 0.7–4.0)
MCH: 34.7 pg — ABNORMAL HIGH (ref 26.0–34.0)
MCHC: 33.8 g/dL (ref 30.0–36.0)
MCV: 102.7 fL — ABNORMAL HIGH (ref 80.0–100.0)
Monocytes Absolute: 0.6 10*3/uL (ref 0.1–1.0)
Monocytes Relative: 8 %
Neutro Abs: 6.4 10*3/uL (ref 1.7–7.7)
Neutrophils Relative %: 76 %
Platelets: 121 10*3/uL — ABNORMAL LOW (ref 150–400)
RBC: 2.22 MIL/uL — ABNORMAL LOW (ref 4.22–5.81)
RDW: 14.8 % (ref 11.5–15.5)
WBC: 8.3 10*3/uL (ref 4.0–10.5)
nRBC: 0 % (ref 0.0–0.2)

## 2020-04-02 LAB — PHOSPHORUS: Phosphorus: 3.2 mg/dL (ref 2.5–4.6)

## 2020-04-02 LAB — MAGNESIUM: Magnesium: 2.2 mg/dL (ref 1.7–2.4)

## 2020-04-02 NOTE — Progress Notes (Signed)
PROGRESS NOTE  Jordan Perkins  DOB: 07-18-1941  PCP: [name], MD UDJ:[id number]  DOA: 03/23/2020  LOS: 10 days   Chief Complaint  Patient presents with  . Weakness   Brief narrative: Patient is a 79 year old gentleman with history of hypertension, hyperlipidemia, A. fib on Xarelto, pulmonary artery hypertension, coronary artery disease, CABG, diastolic congestive heart failure, anemia, CKD stage III and possible dementia. Patient presented to the ED on 7/14 with complaint of multiple falls, generalized weakness and leg edema.  In the ED, patient was hemodynamically stable, breathing on room air. Labs with BNP elevated to 574, troponin negative x2, positive FOBT.   COVID-19 negative.   CT scan abdomen pelvis essentially normal, small right pleural effusion, anasarca and ascites. Patient was admitted to hospital service for further evaluation management.  7/19 E. coli UTI less than 100,000 colonies not treated.  Patient asymptomatic. 7/21, an episode of fever with no defined source.    Subjective: Patient was seen and examined this morning. Pleasant elderly Caucasian male. Sitting up in chair. Not in distress. No new symptoms. No cough supplemental oxygen.  Assessment/Plan: Acute on chronic diastolic congestive heart failure Pulmonary hypertension -Presented with generalized weakness, leg edema.  -Noted to have elevated BNP.  -Echo with known ejection fraction 60 to 65%.   -Pulmonary hypertension.   -Adequately diuresed with IV Lasix. Currently euvolemic.   AKI on CKD 2 -At baseline, creatinine less than 1.2 -With IV Lasix, creatinine trended up to peak at 1.53 on 7/23. -Lasix, lisinopril and Aldactone were held. Creatinine improving, 1.36 today. I would not resume Lasix at this time. Patient looks euvolemic already. -If creatinine continues to improve, can gradually resume cardiac meds.  Paroxysmal A. fib  -patient on metoprolol and Xarelto at home.     -Metoprolol was discontinued because of concern about bradycardia.  -Heart rate is currently stable.  -Xarelto was stopped because of positive FOBT, subsequent drop in hemoglobin as well as history of frequent falls.  Acute on chronic anemia -Hemoglobin at baseline between 10 to 11. Since admission, hemoglobin is dropping down, 7.7 this morning. -Noted that FOBT was positive and Xarelto was stopped. Currently on Protonix. No active bleeding. Continue to monitor hemoglobin.  Hyperlipidemia:  -On statin.  Hypokalemia/hypomagnesemia/hypophosphatemia:  -Electrolyte replaced. Potassium level seems to be trending up, 5 today. I would hold any further potassium replacement at this time.   Advanced physical deconditioning:  -Due to multiple medical issues.   SNF recommended.  -Pending approval.   Mobility: Encourage ambulation. PT eval obtained.. Code Status:   Code Status: Full Code  Nutritional status: Body mass index is 23.29 kg/m.     Diet Order            Diet - low sodium heart healthy           Diet Heart Room service appropriate? Yes; Fluid consistency: Thin; Fluid restriction: 1500 mL Fluid  Diet effective now                 DVT prophylaxis: Place and maintain sequential compression device Start: 03/24/20 0828 SCDs Start: 03/23/20 1738   Antimicrobials:  None Fluid: None  Consultants: None Family Communication:  None at bedside  Status is: Inpatient  Remains inpatient appropriate because:Ongoing diagnostic testing needed not appropriate for outpatient work up and IV treatments appropriate due to intensity of illness or inability to take PO   Dispo:  Patient From: Home  Planned Disposition: [place]  Expected discharge date:  Pending insurance authorization  medically stable for discharge: Yes   Infusions:  . sodium chloride      Scheduled Meds: . atorvastatin  80 mg Oral Daily  . Chlorhexidine Gluconate Cloth  6 each Topical Daily   . pantoprazole  40 mg Oral BID  . sodium chloride flush  3 mL Intravenous Q12H    Antimicrobials: Anti-infectives (From admission, onward)   None      PRN meds: sodium chloride, acetaminophen, hydrALAZINE, ondansetron (ZOFRAN) IV, sodium chloride flush, traMADol   Objective: Vitals:   04/02/20 0900 04/02/20 1159  BP:  (!) 113/49  Pulse: 65 61  Resp:  18  Temp:  97.9 F (36.6 C)  SpO2:  99%    Intake/Output Summary (Last 24 hours) at 04/02/2020 1313 Last data filed at 04/02/2020 0538 Gross per 24 hour  Intake --  Output 1750 ml  Net -1750 ml   Filed Weights   03/27/20 0328 03/28/20 0445 03/30/20 0515  Weight: 78.2 kg 78.6 kg 75.8 kg   Weight change:  Body mass index is 23.29 kg/m.   Physical Exam: General exam: Appears calm and comfortable.  Skin: No rashes, lesions or ulcers. HEENT: Atraumatic, normocephalic, supple neck, no obvious bleeding Lungs: Clear to auscultation bilaterally CVS: Regular rate and rhythm, no murmur GI/Abd soft, nontender, nondistended, bowel sound present CNS: Alert, awake, oriented x3 Psychiatry: Mood appropriate Extremities: No pedal edema, no calf tenderness  Data Review: I have personally reviewed the laboratory data and studies available.  Recent Labs  Lab 03/27/20 0434 03/28/20 0302 03/29/20 0445 03/30/20 0446 04/02/20 0501  WBC 9.5 8.0 5.6 9.1 8.3  NEUTROABS  --   --   --   --  6.4  HGB 8.5* 8.3* 8.0* 8.4* 7.7*  HCT 24.5* 24.7* 22.8* 23.7* 22.8*  MCV 101.7* 104.2* 100.4* 98.8 102.7*  PLT 142* 151 128* 118* 121*   Recent Labs  Lab 03/27/20 0434 03/27/20 0434 03/28/20 0302 03/28/20 0302 03/29/20 0445 03/30/20 0446 03/31/20 0519 04/01/20 0434 04/02/20 0501  NA 136   < > 134*   < > 133* 131* 131* 133* 132*  K 3.9   < > 3.9   < > 3.8 4.0 3.2* 3.9 5.0  CL 100   < > 102   < > 100 99 97* 98 101  CO2 28   < > 27   < > 28 25 28 24 25   GLUCOSE 155*   < > 141*   < > 148* 146* 114* 116* 114*  BUN 27*   < > 27*   < >  28* 30* 44* 55* 53*  CREATININE 1.23   < > 1.02   < > 1.11 1.26* 1.44* 1.53* 1.36*  CALCIUM 7.9*   < > 7.8*   < > 7.6* 7.7* 7.5* 7.8* 8.0*  MG 1.6*  --  1.9  --  1.6* 1.9  --   --  2.2  PHOS 1.9*   < > 1.7*  --  2.1* 2.1*  --  3.4 3.2   < > = values in this interval not displayed.    Signed, Terrilee Croak, MD [facility] Pager: [phone] (Secure Chat preferred). 04/02/2020

## 2020-04-02 NOTE — Progress Notes (Signed)
Patient had non-eventful night. Gave pain medication for chronic leg pain. Administration was effective in relieving pain. Patient slept most of night. Breathing was unlabored and even. External catheter still in place. Output documented in e-chart.

## 2020-04-02 NOTE — Plan of Care (Signed)
  Problem: Education: Goal: Knowledge of General Education information will improve Description Including pain rating scale, medication(s)/side effects and non-pharmacologic comfort measures Outcome: Progressing   

## 2020-04-03 DIAGNOSIS — I5033 Acute on chronic diastolic (congestive) heart failure: Secondary | ICD-10-CM | POA: Diagnosis not present

## 2020-04-03 LAB — BASIC METABOLIC PANEL
Anion gap: 4 — ABNORMAL LOW (ref 5–15)
BUN: 49 mg/dL — ABNORMAL HIGH (ref 8–23)
CO2: 26 mmol/L (ref 22–32)
Calcium: 8.1 mg/dL — ABNORMAL LOW (ref 8.9–10.3)
Chloride: 103 mmol/L (ref 98–111)
Creatinine, Ser: 1.34 mg/dL — ABNORMAL HIGH (ref 0.61–1.24)
GFR calc Af Amer: 59 mL/min — ABNORMAL LOW (ref >60)
GFR calc non Af Amer: 51 mL/min — ABNORMAL LOW (ref >60)
Glucose, Bld: 131 mg/dL — ABNORMAL HIGH (ref 70–99)
Potassium: 5.3 mmol/L — ABNORMAL HIGH (ref 3.5–5.1)
Sodium: 133 mmol/L — ABNORMAL LOW (ref 135–145)

## 2020-04-03 LAB — CBC WITH DIFFERENTIAL/PLATELET
Abs Immature Granulocytes: 0.03 10*3/uL (ref 0.00–0.07)
Basophils Absolute: 0 10*3/uL (ref 0.0–0.1)
Basophils Relative: 1 %
Eosinophils Absolute: 0.2 10*3/uL (ref 0.0–0.5)
Eosinophils Relative: 3 %
HCT: 23.3 % — ABNORMAL LOW (ref 39.0–52.0)
Hemoglobin: 7.8 g/dL — ABNORMAL LOW (ref 13.0–17.0)
Immature Granulocytes: 0 %
Lymphocytes Relative: 11 %
Lymphs Abs: 0.8 10*3/uL (ref 0.7–4.0)
MCH: 34.4 pg — ABNORMAL HIGH (ref 26.0–34.0)
MCHC: 33.5 g/dL (ref 30.0–36.0)
MCV: 102.6 fL — ABNORMAL HIGH (ref 80.0–100.0)
Monocytes Absolute: 0.6 10*3/uL (ref 0.1–1.0)
Monocytes Relative: 9 %
Neutro Abs: 5.5 10*3/uL (ref 1.7–7.7)
Neutrophils Relative %: 76 %
Platelets: 141 10*3/uL — ABNORMAL LOW (ref 150–400)
RBC: 2.27 MIL/uL — ABNORMAL LOW (ref 4.22–5.81)
RDW: 14.7 % (ref 11.5–15.5)
WBC: 7.2 10*3/uL (ref 4.0–10.5)
nRBC: 0 % (ref 0.0–0.2)

## 2020-04-03 LAB — MAGNESIUM: Magnesium: 2.1 mg/dL (ref 1.7–2.4)

## 2020-04-03 MED ORDER — CALAMINE EX LOTN
TOPICAL_LOTION | CUTANEOUS | Status: DC | PRN
  Filled 2020-04-03: qty 177

## 2020-04-03 NOTE — Progress Notes (Signed)
PROGRESS NOTE  Jordan Perkins  DOB: 1941-06-03  PCP: [name], MD QQP:[id number]  DOA: 03/23/2020  LOS: 11 days   Chief Complaint  Patient presents with  . Weakness   Brief narrative: Patient is a 79 year old gentleman with history of hypertension, hyperlipidemia, A. fib on Xarelto, pulmonary artery hypertension, coronary artery disease, CABG, diastolic congestive heart failure, anemia, CKD stage III and possible dementia. Patient presented to the ED on 7/14 with complaint of multiple falls, generalized weakness and leg edema.  In the ED, patient was hemodynamically stable, breathing on room air. Labs with BNP elevated to 574, troponin negative x2, positive FOBT.   COVID-19 negative.   CT scan abdomen pelvis essentially normal, small right pleural effusion, anasarca and ascites. Patient was admitted to hospital service for further evaluation management.  7/19 E. coli UTI less than 100,000 colonies not treated.  Patient asymptomatic. 7/21, an episode of fever with no defined source.    Subjective: Patient was seen and examined this morning. Pleasant elderly Caucasian male. Propped up in bed.  Not in distress.  No new symptoms.  Hemodynamically stable.  Labs from this morning noted, potassium for some reason is up today at 5.3.  Not on potassium supplement.  Creatinine stable at 1.34, hemoglobin stable at 7.8.   Assessment/Plan: Acute on chronic diastolic congestive heart failure Pulmonary hypertension -Presented with generalized weakness, leg edema.  -Noted to have elevated BNP.  -Echo with known ejection fraction 60 to 65%.   -Pulmonary hypertension.   -Adequately diuresed with IV Lasix. Currently euvolemic.   AKI on CKD 2 -At baseline, creatinine less than 1.2 -With IV Lasix, creatinine trended up to peak at 1.53 on 7/23.  -Lasix, lisinopril and Aldactone were held. Creatinine improving, 1.34 today. I would not resume Lasix at this time. Patient looks  euvolemic already. -If creatinine continues to improve, can gradually resume cardiac meds.  Paroxysmal A. fib  -patient is on metoprolol and Xarelto at home.   -Metoprolol was discontinued because of concern about bradycardia.  -Heart rate is currently stable.  -Xarelto was stopped because of positive FOBT, subsequent drop in hemoglobin as well as history of frequent falls.  Acute on chronic anemia -Hemoglobin at baseline between 10 to 11. Since admission, hemoglobin is dropping down, 7.8 this morning. -Noted that FOBT was positive and Xarelto was stopped. Currently on Protonix. No active bleeding. Continue to monitor hemoglobin.  Hyperlipidemia:  -On statin.  Hypokalemia/hyperkalemia -With Lasix, potassium level low and needed to be replaced.  Currently patient is not on IV Lasix or potassium supplement.  Potassium level this morning is up at 5.3.  Suspect hemolyzed sample. -Repeat potassium level tomorrow.  Advanced physical deconditioning:  -Due to multiple medical issues.   SNF recommended.  -Pending approval.   Mobility: Encourage ambulation. PT eval obtained.. Code Status:   Code Status: Full Code  Nutritional status: Body mass index is 23.29 kg/m.     Diet Order            Diet - low sodium heart healthy           Diet Heart Room service appropriate? Yes; Fluid consistency: Thin; Fluid restriction: 1500 mL Fluid  Diet effective now                 DVT prophylaxis: Place and maintain sequential compression device Start: 03/24/20 0828 SCDs Start: 03/23/20 1738   Antimicrobials:  None Fluid: None  Consultants: None Family Communication:  None at bedside  Status  is: Inpatient  Remains inpatient appropriate because:Ongoing diagnostic testing needed not appropriate for outpatient work up and IV treatments appropriate due to intensity of illness or inability to take PO   Dispo:  Patient From: Home  Planned Disposition: [place]  Expected discharge date: Pending insurance authorization  medically stable for discharge: Yes   Infusions:  . sodium chloride      Scheduled Meds: . atorvastatin  80 mg Oral Daily  . Chlorhexidine Gluconate Cloth  6 each Topical Daily  . pantoprazole  40 mg Oral BID  . sodium chloride flush  3 mL Intravenous Q12H    Antimicrobials: Anti-infectives (From admission, onward)   None      PRN meds: sodium chloride, acetaminophen, calamine, hydrALAZINE, ondansetron (ZOFRAN) IV, sodium chloride flush, traMADol   Objective: Vitals:   04/03/20 0501 04/03/20 0919  BP: (!) 116/53 (!) 120/50  Pulse: 57 62  Resp: 18 18  Temp: 98.6 F (37 C) 97.8 F (36.6 C)  SpO2: 99% 100%    Intake/Output Summary (Last 24 hours) at 04/03/2020 1159 Last data filed at 04/02/2020 2302 Gross per 24 hour  Intake 543 ml  Output 950 ml  Net -407 ml   Filed Weights   03/27/20 0328 03/28/20 0445 03/30/20 0515  Weight: 78.2 kg 78.6 kg 75.8 kg   Weight change:  Body mass index is 23.29 kg/m.   Physical Exam: General exam: Appears calm and comfortable.  Not in physical distress Skin: No rashes, lesions or ulcers. HEENT: Atraumatic, normocephalic, supple neck, no obvious bleeding Lungs: Clear to auscultation bilaterally, no crackles or wheezing CVS: Regular rate and rhythm, no murmur GI/Abd soft, nontender, nondistended, bowel sound present CNS: Alert, awake, oriented x3 Psychiatry: Mood appropriate Extremities: No pedal edema, no calf tenderness  Data Review: I have personally reviewed the laboratory data and studies available.  Recent Labs  Lab 03/28/20 0302 03/29/20 0445 03/30/20 0446 04/02/20 0501 04/03/20 0414  WBC 8.0 5.6 9.1 8.3 7.2  NEUTROABS  --   --   --  6.4 5.5  HGB 8.3* 8.0* 8.4* 7.7* 7.8*  HCT 24.7* 22.8* 23.7* 22.8* 23.3*  MCV 104.2* 100.4* 98.8 102.7* 102.6*  PLT 151 128* 118* 121* 141*   Recent Labs  Lab 03/28/20 0302 03/28/20 0302 03/29/20 0445  03/29/20 0445 03/30/20 0446 03/31/20 0519 04/01/20 0434 04/02/20 0501 04/03/20 0414  NA 134*   < > 133*   < > 131* 131* 133* 132* 133*  K 3.9   < > 3.8   < > 4.0 3.2* 3.9 5.0 5.3*  CL 102   < > 100   < > 99 97* 98 101 103  CO2 27   < > 28   < > 25 28 24 25 26   GLUCOSE 141*   < > 148*   < > 146* 114* 116* 114* 131*  BUN 27*   < > 28*   < > 30* 44* 55* 53* 49*  CREATININE 1.02   < > 1.11   < > 1.26* 1.44* 1.53* 1.36* 1.34*  CALCIUM 7.8*   < > 7.6*   < > 7.7* 7.5* 7.8* 8.0* 8.1*  MG 1.9  --  1.6*  --  1.9  --   --  2.2 2.1  PHOS 1.7*  --  2.1*  --  2.1*  --  3.4 3.2  --    < > = values in this interval not displayed.    Signed, Terrilee Croak, MD [facility]  Pager: [phone] (Secure Chat preferred). 04/03/2020

## 2020-04-04 DIAGNOSIS — I5033 Acute on chronic diastolic (congestive) heart failure: Secondary | ICD-10-CM | POA: Diagnosis not present

## 2020-04-04 LAB — BASIC METABOLIC PANEL
Anion gap: 3 — ABNORMAL LOW (ref 5–15)
BUN: 40 mg/dL — ABNORMAL HIGH (ref 8–23)
CO2: 27 mmol/L (ref 22–32)
Calcium: 8.1 mg/dL — ABNORMAL LOW (ref 8.9–10.3)
Chloride: 105 mmol/L (ref 98–111)
Creatinine, Ser: 1.06 mg/dL (ref 0.61–1.24)
GFR calc Af Amer: >60 mL/min (ref >60)
GFR calc non Af Amer: >60 mL/min (ref >60)
Glucose, Bld: 116 mg/dL — ABNORMAL HIGH (ref 70–99)
Potassium: 4.8 mmol/L (ref 3.5–5.1)
Sodium: 135 mmol/L (ref 135–145)

## 2020-04-04 LAB — CBC WITH DIFFERENTIAL/PLATELET
Abs Immature Granulocytes: 0.04 10*3/uL (ref 0.00–0.07)
Basophils Absolute: 0.1 10*3/uL (ref 0.0–0.1)
Basophils Relative: 1 %
Eosinophils Absolute: 0.4 10*3/uL (ref 0.0–0.5)
Eosinophils Relative: 5 %
HCT: 22.9 % — ABNORMAL LOW (ref 39.0–52.0)
Hemoglobin: 7.6 g/dL — ABNORMAL LOW (ref 13.0–17.0)
Immature Granulocytes: 1 %
Lymphocytes Relative: 12 %
Lymphs Abs: 0.9 10*3/uL (ref 0.7–4.0)
MCH: 34.2 pg — ABNORMAL HIGH (ref 26.0–34.0)
MCHC: 33.2 g/dL (ref 30.0–36.0)
MCV: 103.2 fL — ABNORMAL HIGH (ref 80.0–100.0)
Monocytes Absolute: 0.6 10*3/uL (ref 0.1–1.0)
Monocytes Relative: 8 %
Neutro Abs: 5.6 10*3/uL (ref 1.7–7.7)
Neutrophils Relative %: 73 %
Platelets: 163 10*3/uL (ref 150–400)
RBC: 2.22 MIL/uL — ABNORMAL LOW (ref 4.22–5.81)
RDW: 14.6 % (ref 11.5–15.5)
WBC: 7.6 10*3/uL (ref 4.0–10.5)
nRBC: 0 % (ref 0.0–0.2)

## 2020-04-04 MED ORDER — FUROSEMIDE 40 MG PO TABS: 40.0000 mg | ORAL_TABLET | Freq: Two times a day (BID) | ORAL | Status: DC

## 2020-04-04 MED ORDER — FUROSEMIDE 40 MG PO TABS
40.0000 mg | ORAL_TABLET | Freq: Two times a day (BID) | ORAL | Status: DC
  Administered 2020-04-04 – 2020-04-06 (×4): 40 mg via ORAL
  Filled 2020-04-04 (×4): qty 1

## 2020-04-04 NOTE — Progress Notes (Signed)
PROGRESS NOTE  Jordan Perkins  DOB: [birth year]  PCP: [name], MD ZOX:[id number]  DOA: 03/23/2020  LOS: 12 days   Chief Complaint  Patient presents with  . Weakness   Brief narrative: Patient is a 79 year old gentleman with history of hypertension, hyperlipidemia, A. fib on Xarelto, pulmonary artery hypertension, coronary artery disease, CABG, diastolic congestive heart failure, anemia, CKD stage III and possible dementia. Patient presented to the ED on 7/14 with complaint of multiple falls, generalized weakness and leg edema.  In the ED, patient was hemodynamically stable, breathing on room air. Labs with BNP elevated to 574, troponin negative x2, positive FOBT.   COVID-19 negative.   CT scan abdomen pelvis essentially normal, small right pleural effusion, anasarca and ascites. Patient was admitted to hospital service for further evaluation management.  7/19 E. coli UTI less than 100,000 colonies not treated.  Patient asymptomatic. 7/21, an episode of fever with no defined source.    Subjective: Patient was seen and examined this morning. Pleasant elderly Caucasian male. No overnight events Patient himself denies any complaints, however providers noticed leg swelling.  Assessment/Plan: Acute on chronic diastolic congestive heart failure Pulmonary hypertension -Presented with generalized weakness, leg edema.  -Noted to have elevated BNP.  -Echo with known ejection fraction 60 to 65%.   -Pulmonary hypertension.   -Adequately diuresed with IV Lasix.  Given diuretic holiday due to elevated creatinine, now resolved.  Resume Lasix 40 mg twice a day today.  AKI on CKD 2 -At baseline, creatinine less than 1.2 -With IV Lasix, creatinine trended up to peak at 1.53 on 7/23.  -Lasix, lisinopril and Aldactone were held. Creatinine improving, 1.34-1.06.  -Resume Lasix today.  If blood pressure allows, will start on Aldactone and lisinopril tomorrow.  Paroxysmal A. fib   -patient was on metoprolol and Xarelto at home.   -Metoprolol was discontinued because of concern about bradycardia.  -Heart rate is currently stable.  -Xarelto was stopped because of positive FOBT, subsequent drop in hemoglobin as well as history of frequent falls.  Acute on chronic anemia -Hemoglobin at baseline between 10 to 11. Since admission, hemoglobin is dropping down, 7.6 this morning. -Noted that FOBT was positive and Xarelto was stopped. Currently on Protonix. No active bleeding. Continue to monitor hemoglobin.  Hyperlipidemia:  -On statin.  Hypokalemia/hyperkalemia -Normalized.  Advanced physical deconditioning:  -Due to multiple medical issues.   SNF recommended.  -Pending approval.   Mobility: Encourage ambulation. PT eval obtained.. Code Status:   Code Status: Full Code  Nutritional status: Body mass index is 23.29 kg/m.     Diet Order            Diet - low sodium heart healthy           Diet Heart Room service appropriate? Yes; Fluid consistency: Thin; Fluid restriction: 1500 mL Fluid  Diet effective now                 DVT prophylaxis: Place and maintain sequential compression device Start: 03/24/20 0828 SCDs Start: 03/23/20 1738   Antimicrobials:  None Fluid: None  Consultants: None Family Communication:  None at bedside  Status is: Inpatient  Remains inpatient appropriate because:Ongoing diagnostic testing needed not appropriate for outpatient work up and IV treatments appropriate due to intensity of illness or inability to take PO   Dispo:  Patient From: Home  Planned Disposition: [place]  Expected discharge date: Pending insurance authorization  medically stable for discharge: Yes   Infusions:  .  sodium chloride      Scheduled Meds: . atorvastatin  80 mg Oral Daily  . Chlorhexidine Gluconate Cloth  6 each Topical Daily  . furosemide  40 mg Oral BID  . pantoprazole  40 mg Oral BID  . sodium chloride flush  3  mL Intravenous Q12H    Antimicrobials: Anti-infectives (From admission, onward)   None      PRN meds: sodium chloride, acetaminophen, calamine, hydrALAZINE, ondansetron (ZOFRAN) IV, sodium chloride flush, traMADol   Objective: Vitals:   04/04/20 0735 04/04/20 1240  BP: (!) 128/53 (!) 120/42  Pulse: 55 64  Resp: 19 19  Temp: (!) 97.5 F (36.4 C) 98.3 F (36.8 C)  SpO2: 100% 100%    Intake/Output Summary (Last 24 hours) at 04/04/2020 1316 Last data filed at 04/04/2020 1023 Gross per 24 hour  Intake 480 ml  Output 1550 ml  Net -1070 ml   Filed Weights   03/28/20 0445 03/30/20 0515  Weight: 78.6 kg 75.8 kg   Weight change:  Body mass index is 23.29 kg/m.   Physical Exam: General exam: Appears calm and comfortable.  Not in physical distress Skin: No rashes, lesions or ulcers. HEENT: Atraumatic, normocephalic, supple neck, no obvious bleeding Lungs: Clear to auscultation bilaterally, no crackles or wheezing CVS: Regular rate and rhythm, no murmur 1+ bilateral pedal edema. GI/Abd soft, nontender, nondistended, bowel sound present CNS: Alert, awake, oriented x3 Psychiatry: Mood appropriate Extremities: No pedal edema, no calf tenderness  Data Review: I have personally reviewed the laboratory data and studies available.  Recent Labs  Lab 03/29/20 0445 03/30/20 0446 04/02/20 0501 04/03/20 0414 04/04/20 0418  WBC 5.6 9.1 8.3 7.2 7.6  NEUTROABS  --   --  6.4 5.5 5.6  HGB 8.0* 8.4* 7.7* 7.8* 7.6*  HCT 22.8* 23.7* 22.8* 23.3* 22.9*  MCV 100.4* 98.8 102.7* 102.6* 103.2*  PLT 128* 118* 121* 141* 163   Recent Labs  Lab 03/29/20 0445 03/29/20 0445 03/30/20 0446 03/30/20 0446 03/31/20 0519 04/01/20 0434 04/02/20 0501 04/03/20 0414 04/04/20 0418  NA 133*   < > 131*   < > 131* 133* 132* 133* 135  K 3.8   < > 4.0   < > 3.2* 3.9 5.0 5.3* 4.8  CL 100   < > 99   < > 97* 98 101 103 105  CO2 28   < > 25   < > 28 24 25 26 27   GLUCOSE 148*   < > 146*   < > 114* 116*  114* 131* 116*  BUN 28*   < > 30*   < > 44* 55* 53* 49* 40*  CREATININE 1.11   < > 1.26*   < > 1.44* 1.53* 1.36* 1.34* 1.06  CALCIUM 7.6*   < > 7.7*   < > 7.5* 7.8* 8.0* 8.1* 8.1*  MG 1.6*  --  1.9  --   --   --  2.2 2.1  --   PHOS 2.1*  --  2.1*  --   --  3.4 3.2  --   --    < > = values in this interval not displayed.    Signed, Barb Merino, MD [facility] 04/04/2020  Total time spent: 30 minutes

## 2020-04-04 NOTE — Progress Notes (Signed)
OT Cancellation Note  Patient Details Name: Jordan Perkins MRN: [id number] DOB: [birth year]   Cancelled Treatment:    Reason Eval/Treat Not Completed: Patient declined, no reason specified. Upon arrival pt found reclined in chair, reports wants to sit longer and is too fatigued for exercises. Will follow up at later time/date as able.   Dessie Coma, M.S. OTR/L  04/04/20, 1:34 PM

## 2020-04-04 NOTE — Progress Notes (Signed)
Mobility Specialist - Progress Note   04/04/20 1205  Mobility  Activity Transferred:  Bed to chair  Level of Assistance Moderate assist, patient does 50-74%  Assistive Device Front wheel walker  Distance Ambulated (ft) 10 ft  Mobility Response Tolerated well  Mobility performed by Mobility specialist  $Mobility charge 1 Mobility    Pre-mobility: 66 HR, 132/56 BP, 100% SpO2 Post-mobility: 66 HR, 113/77 BP, 99% SpO2  Pt was in bed upon arrival. Agreed to ambulate bed-to-recliner. Pt denied any pain in LE. Pt had a BM prior to ambulating, and was cleaned before transition. Mod Assist from sit-to-stand. Leg edema limited pt's performance, as gait pattern was slow and shuffled today. Pt did require cueing to help with ambulation, as he showed struggle in applying pressure onto right foot. Overall tolerated well. Chair alarm on and call bell in reach. Nurse was notified of session.   Kathee Delton Mobility Specialist 04/04/20, 12:22 PM

## 2020-04-04 NOTE — Care Management Important Message (Signed)
Important Message  Patient Details  Name: Edouard Gikas MRN: [id number] Date of Birth: July 17, 1941   Medicare Important Message Given:  Yes     [name] 04/04/2020, 11:34 AM

## 2020-04-04 NOTE — TOC Progression Note (Signed)
Transition of Care [facility]) - Progression Note    Patient Details  Name: Jordan Perkins MRN: [id number] Date of Birth: 10/04/1940  [facility] CM/SW Contact  Eileen Stanford, LCSW Phone Number: 04/04/2020, 9:45 AM  Clinical Narrative:   Peer to peer still being reviewed.         Expected Discharge Plan and Services           Expected Discharge Date: 03/27/20                                     Social Determinants of Health (SDOH) Interventions    Readmission Risk Interventions No flowsheet data found.

## 2020-04-05 DIAGNOSIS — I5033 Acute on chronic diastolic (congestive) heart failure: Secondary | ICD-10-CM | POA: Diagnosis not present

## 2020-04-05 MED ORDER — SPIRONOLACTONE 25 MG PO TABS
25.0000 mg | ORAL_TABLET | Freq: Every day | ORAL | Status: DC
  Administered 2020-04-05 – 2020-04-06 (×2): 25 mg via ORAL
  Filled 2020-04-05 (×2): qty 1

## 2020-04-05 NOTE — TOC Progression Note (Signed)
Transition of Care [facility]) - Progression Note    Patient Details  Name: Jordan Perkins MRN: [id number] Date of Birth: 1940-12-04  Transition of Care [facility]) CM/SW Contact  Eileen Stanford, LCSW Phone Number: 04/05/2020, 10:59 AM  Clinical Narrative:    As of this morning [employer] appeal still pending.        Expected Discharge Plan and Services           Expected Discharge Date: 03/27/20                                     Social Determinants of Health (SDOH) Interventions    Readmission Risk Interventions No flowsheet data found.

## 2020-04-05 NOTE — Progress Notes (Signed)
Nutrition Brief Note  Patient identified for LOS  79 year old gentleman with history of hypertension, hyperlipidemia, A. fib on Xarelto, pulmonary artery hypertension, coronary artery disease, CABG, diastolic congestive heart failure, anemia, CKD stage III and possible dementia.Patient presented to the ED on 7/14 with complaint of multiple falls, generalized weakness and leg edema.  Wt Readings from Last 15 Encounters:  02/26/20 87.1 kg  06/12/19 87.1 kg  02/18/19 82.3 kg  09/23/17 84.1 kg  01/14/17 87.1 kg  11/05/16 86.6 kg  11/01/16 81.2 kg  07/12/16 89.7 kg  04/06/15 91.9 kg  07/21/14 92.4 kg  11/17/13 90.5 kg  06/23/13 84.6 kg    Body mass index is 23.29 kg/m. Patient meets criteria for normal weight based on current BMI. Per chart, pt down 14lbs(8%) over the past year; this is not significant.   Current diet order is HH, patient is consuming approximately 100% of meals at this time. Labs and medications reviewed.   No nutrition interventions warranted at this time. If nutrition issues arise, please consult RD.   Jordan Distance MS, RD, LDN Please refer to [facility] for RD and/or RD on-call/weekend/after hours pager

## 2020-04-05 NOTE — Progress Notes (Signed)
PROGRESS NOTE  Jordan Perkins  DOB: April 27, 1941  PCP: [name], MD MWN:[id number]  DOA: 03/23/2020  LOS: 13 days   Chief Complaint  Patient presents with  . Weakness   Brief narrative: Patient is a 79 year old gentleman with history of hypertension, hyperlipidemia, A. fib on Xarelto, pulmonary artery hypertension, coronary artery disease, CABG, diastolic congestive heart failure, anemia, CKD stage III and possible dementia. Patient presented to the ED on 7/14 with complaint of multiple falls, generalized weakness and leg edema.  In the ED, patient was hemodynamically stable, breathing on room air. Labs with BNP elevated to 574, troponin negative x2, positive FOBT.   COVID-19 negative.   CT scan abdomen pelvis essentially normal, small right pleural effusion, anasarca and ascites. Patient was admitted to hospital service for further evaluation management.  7/19 E. coli UTI less than 100,000 colonies not treated.  Patient asymptomatic. 7/21, an episode of fever with no defined source.    Subjective: Patient was seen and examined this morning. Pleasant elderly Caucasian male. No overnight events His right leg hurts on mobility. Nursing reported mobility with moderate assist with 1 person. "I do not think I can go home like this, my brother cannot help me all the time"  Assessment/Plan: Acute on chronic diastolic congestive heart failure Pulmonary hypertension -Presented with generalized weakness, leg edema.  -Noted to have elevated BNP.  -Echo with known ejection fraction 60 to 65%.   -Pulmonary hypertension.   -Adequately diuresed with IV Lasix.  Given diuretic holiday due to elevated creatinine, now resolved.  Now on Lasix 40 mg twice a day.  Euvolemic.  AKI on CKD 2 -At baseline, creatinine less than 1.2 -With IV Lasix, creatinine trended up to peak at 1.53 on 7/23.  -Lasix, lisinopril and Aldactone were held. Creatinine improving, 1.34-1.06.  -Resume  Lasix.  If blood pressure allows, will start on Aldactone and lisinopril by tomorrow.  Paroxysmal A. fib  -patient was on metoprolol and Xarelto at home.   -Metoprolol was discontinued because of concern about bradycardia.  -Heart rate is currently stable.  -Xarelto was stopped because of positive FOBT, subsequent drop in hemoglobin as well as history of frequent falls.  Acute on chronic anemia -Hemoglobin at baseline between 10 to 11. Since admission, hemoglobin is dropping down, 7.6 this morning. -Noted that FOBT was positive and Xarelto was stopped. Currently on Protonix. No active bleeding. Continue to monitor hemoglobin.  Hyperlipidemia:  -On statin.  Hypokalemia/hyperkalemia -Normalized.  Advanced physical deconditioning:  -Due to multiple medical issues.   SNF recommended.  -Pending approval.  -Peer to peer done with insurance company, expediated appeal done on 7/23 and waiting for last 4 days for approval.  Mobility: Encourage ambulation. PT eval obtained.. Code Status:   Code Status: Full Code  Nutritional status: Body mass index is 23.29 kg/m.     Diet Order            Diet - low sodium heart healthy           Diet Heart Room service appropriate? Yes; Fluid consistency: Thin; Fluid restriction: 1500 mL Fluid  Diet effective now                 DVT prophylaxis: Place and maintain sequential compression device Start: 03/24/20 0828 SCDs Start: 03/23/20 1738   Antimicrobials:  None Fluid: None  Consultants: None Family Communication:  None  Status is: Inpatient  Remains inpatient appropriate because:Ongoing diagnostic testing needed not appropriate for outpatient work up and  IV treatments appropriate due to intensity of illness or inability to take PO   Dispo:  Patient From: Home  Planned Disposition: [place]  Expected discharge date: Pending insurance authorization  medically stable for discharge: Yes   Infusions:  . sodium  chloride      Scheduled Meds: . atorvastatin  80 mg Oral Daily  . Chlorhexidine Gluconate Cloth  6 each Topical Daily  . furosemide  40 mg Oral BID  . pantoprazole  40 mg Oral BID  . sodium chloride flush  3 mL Intravenous Q12H    Antimicrobials: Anti-infectives (From admission, onward)   None      PRN meds: sodium chloride, acetaminophen, calamine, hydrALAZINE, ondansetron (ZOFRAN) IV, sodium chloride flush, traMADol   Objective: Vitals:   04/05/20 0819 04/05/20 1122  BP: (!) 129/54 (!) 129/56  Pulse: 55 64  Resp: 18 18  Temp: 97.9 F (36.6 C) 98.1 F (36.7 C)  SpO2: 98% 99%    Intake/Output Summary (Last 24 hours) at 04/05/2020 1510 Last data filed at 04/05/2020 1346 Gross per 24 hour  Intake 900 ml  Output 1700 ml  Net -800 ml   Filed Weights   03/30/20 0515  Weight: 75.8 kg   Weight change:  Body mass index is 23.29 kg/m.   Physical Exam: Physical Exam Constitutional:      Comments: Comfortable on room air.  Chronically sick looking elderly gentleman.  Not in any distress.  On room air.  Cardiovascular:     Rate and Rhythm: Normal rate.  Pulmonary:     Breath sounds: Normal breath sounds.  Musculoskeletal:        General: Normal range of motion.  Neurological:     General: No focal deficit present.     Mental Status: He is oriented to person, place, and time.     Data Review: I have personally reviewed the laboratory data and studies available.  Recent Labs  Lab 03/30/20 0446 04/02/20 0501 04/03/20 0414 04/04/20 0418  WBC 9.1 8.3 7.2 7.6  NEUTROABS  --  6.4 5.5 5.6  HGB 8.4* 7.7* 7.8* 7.6*  HCT 23.7* 22.8* 23.3* 22.9*  MCV 98.8 102.7* 102.6* 103.2*  PLT 118* 121* 141* 163   Recent Labs  Lab 03/30/20 0446 03/30/20 0446 03/31/20 0519 04/01/20 0434 04/02/20 0501 04/03/20 0414 04/04/20 0418  NA 131*   < > 131* 133* 132* 133* 135  K 4.0   < > 3.2* 3.9 5.0 5.3* 4.8  CL 99   < > 97* 98 101 103 105  CO2 25   < > 28 24 25 26 27     GLUCOSE 146*   < > 114* 116* 114* 131* 116*  BUN 30*   < > 44* 55* 53* 49* 40*  CREATININE 1.26*   < > 1.44* 1.53* 1.36* 1.34* 1.06  CALCIUM 7.7*   < > 7.5* 7.8* 8.0* 8.1* 8.1*  MG 1.9  --   --   --  2.2 2.1  --   PHOS 2.1*  --   --  3.4 3.2  --   --    < > = values in this interval not displayed.    Signed, Barb Merino, MD [facility] 04/05/2020  Total time spent: 30 minutes

## 2020-04-05 NOTE — TOC Progression Note (Addendum)
Transition of Care [facility]) - Progression Note    Patient Details  Name: Vanderbilt Ranieri MRN: [id number] Date of Birth: 08/12/1941  Transition of Care [facility]) CM/SW Contact  Eileen Stanford, LCSW Phone Number: 04/05/2020, 4:02 PM  Clinical Narrative:  [facility] has denied again. CSW spoke with pt. He is agreeable to [facility]. CSW has reached out to [facility] and [facility]--awaiting responses.  [facility] will accept pt with the start of care Wednesday of next week. MD aware.          Expected Discharge Plan and Services           Expected Discharge Date: 03/27/20                                     Social Determinants of Health (SDOH) Interventions    Readmission Risk Interventions No flowsheet data found.

## 2020-04-05 NOTE — Progress Notes (Signed)
Physical Therapy Treatment Patient Details Name: Jordan Perkins MRN: [id number] DOB: [birth year] Today's Date: 04/05/2020    History of Present Illness Jordan Perkins is a 79 y.o. male with medical history significant of hypertension, hyperlipidemia, atrial fibrillation on Xarelto, PAH, CAD, CABG, dCHF, anemia, PAD, gallstone pancreatitis, CKD-3, possible dementia, who presents with multiple falls, generalized weakness and leg edema; admitted for management of acute/chronic CHF    PT Comments    Pt was long sitting in bed upon arriving. He was alert throughout but only oriented x 2. Increased time to perform task and increased time to process. He required min assist to exit L side of bed. Stood with min assist 4 x EOB. Unable to lift/progress LEs to taking steps with max effort given. Reports pain in RLE. Returned to sitting and perform seated and supine there ex. Pt will benefit for SNF at DC to continue skilled PT to address deficits with strength, balance, endurance, and safe functional mobility. He was in supine with bed alarm in place, call bell in reach, and resting comfortably.    Follow Up Recommendations  SNF     Equipment Recommendations  Rolling walker with 5" wheels;3in1 (PT)    Recommendations for Other Services       Precautions / Restrictions Precautions Precautions: Fall Precaution Comments: Frequent BMs/ bring diaper Restrictions Weight Bearing Restrictions: No    Mobility  Bed Mobility Overal bed mobility: Needs Assistance Bed Mobility: Supine to Sit     Supine to sit: Min assist Sit to supine: Supervision   General bed mobility comments: Pt required increased time + min assist to exit L side of bed. Only supervision required to returnt o supine after EOB/OOB activity  Transfers Overall transfer level: Needs assistance Equipment used: Rolling walker (2 wheeled) Transfers: Sit to/from Stand Sit to Stand: Min assist         General transfer  comment: Pt performed STS 4 x EOB.stood ~ 30 sec-1 minute each trial. pt unable to lift L/R LE to clear floor today. "freezing?"  Ambulation/Gait         Gait velocity: decreased   General Gait Details: unable to progress to ambulation this date   Stairs             Wheelchair Mobility    Modified Rankin (Stroke Patients Only)       Balance Overall balance assessment: Needs assistance Sitting-balance support: No upper extremity supported;Feet supported Sitting balance-Leahy Scale: Good Sitting balance - Comments: no LOB seated EOB   Standing balance support: Bilateral upper extremity supported;During functional activity Standing balance-Leahy Scale: Fair Standing balance comment: Fair static standing, poor dynamic                            Cognition Arousal/Alertness: Awake/alert Behavior During Therapy: Flat affect Overall Cognitive Status: No family/caregiver present to determine baseline cognitive functioning                                 General Comments: Pt was alert but only oriented x 2. Unable to correctly state time/situation      Exercises General Exercises - Lower Extremity Ankle Circles/Pumps: AROM;20 reps;Both Long Arc Quad: Both;AAROM;10 reps;Seated Heel Slides: AROM;Both;Supine;10 reps Hip ABduction/ADduction: Both;AROM;10 reps Straight Leg Raises: AROM;Both;10 reps    General Comments        Pertinent Vitals/Pain Pain Assessment: 0-10 Pain Score:  2  Faces Pain Scale: Hurts a little bit Pain Location: RLE Pain Intervention(s): Limited activity within patient's tolerance;Monitored during session;Premedicated before session;Repositioned    Home Living                      Prior Function            PT Goals (current goals can now be found in the care plan section) Acute Rehab PT Goals Patient Stated Goal: To return home Progress towards PT goals: Progressing toward goals    Frequency     Min 2X/week      PT Plan Current plan remains appropriate    Co-evaluation              AM-PAC PT "6 Clicks" Mobility   Outcome Measure  Help needed turning from your back to your side while in a flat bed without using bedrails?: A Little Help needed moving from lying on your back to sitting on the side of a flat bed without using bedrails?: A Little Help needed moving to and from a bed to a chair (including a wheelchair)?: A Lot Help needed standing up from a chair using your arms (e.g., wheelchair or bedside chair)?: A Lot Help needed to walk in hospital room?: A Little Help needed climbing 3-5 steps with a railing? : A Lot 6 Click Score: 15    End of Session Equipment Utilized During Treatment: Gait belt Activity Tolerance: Patient tolerated treatment well;Patient limited by fatigue Patient left: in bed;with call bell/phone within reach;with bed alarm set Nurse Communication: Mobility status PT Visit Diagnosis: Muscle weakness (generalized) (M62.81);Difficulty in walking, not elsewhere classified (R26.2)     Time: 6701-4103 PT Time Calculation (min) (ACUTE ONLY): 25 min  Charges:  $Therapeutic Exercise: 8-22 mins $Therapeutic Activity: 8-22 mins                    Julaine Fusi PTA 04/05/20, 12:21 PM

## 2020-04-05 NOTE — Progress Notes (Signed)
Mobility Specialist - Progress Note   04/05/20 1332  Mobility  Activity Transferred:  Bed to chair;Sat and stood x 3  Level of Assistance Minimal assist, patient does 75% or more  Assistive Device Front wheel walker  Distance Ambulated (ft) 5 ft  Mobility Response Tolerated well  Mobility performed by Mobility specialist  $Mobility charge 1 Mobility    Pre-mobility: 64 HR, 99% SpO2 Post-mobility: 71 HR, 98% SpO2   Pt was in bed upon arrival. Agreed to session. Pt needed min. Assist getting to EOB. Pt completed seated marching 5x/leg. He needed min assist sit-to-stand and ambulating to chair. Pt completed sit-to-stands 2x at recliner. After session, pt c/o pain 6/10 in right foot. Nurse was notified. Overall, pt tolerated well. Pt left in recliner with the alarm set, and call bell and phone in reach.    Eshaan Titzer Mobility Specialist  04/05/20, 1:38 PM

## 2020-04-05 NOTE — Progress Notes (Signed)
Patient able to walk with PT from bed to chair and perform marching walking while sitting on side of bed. Did complain of pain 6/10 in lower legs and feet.

## 2020-04-06 ENCOUNTER — Telehealth: Payer: Self-pay | Admitting: Family Medicine

## 2020-04-06 DIAGNOSIS — I5033 Acute on chronic diastolic (congestive) heart failure: Secondary | ICD-10-CM

## 2020-04-06 MED ORDER — FERROUS SULFATE 325 (65 FE) MG PO TABS: 325.0000 mg | ORAL_TABLET | Freq: Every day | ORAL | 3 refills | Status: DC

## 2020-04-06 MED ORDER — SPIRONOLACTONE 25 MG PO TABS: 25.0000 mg | ORAL_TABLET | Freq: Every day | ORAL | 0 refills | Status: DC

## 2020-04-06 NOTE — Telephone Encounter (Signed)
Pt scheduled 04/15/20 @ 11:30 with Dr. Caryl Bis for hospital follow up

## 2020-04-06 NOTE — Progress Notes (Signed)
Mobility Specialist - Progress Note   04/06/20 1315  Mobility  Activity Transferred:  Bed to chair  Range of Motion/Exercises Right leg;Left leg (Standing march, seated leg kicks)  Level of Assistance Moderate assist, patient does 50-74%  Assistive Device Front wheel walker (CGA for safety precautions)  Distance Ambulated (ft) 5 ft  Mobility Response Tolerated well  Mobility performed by Mobility specialist  $Mobility charge 1 Mobility    Pre-mobility: 60 HR, 145/55 BP, 100% SpO2 Post-mobility: 58% HR, 158/63 BP, 100% SpO2   Pt was in bed on arrival and agreed to session. Pt c/o a little leg pain but stated today was the "best he felt in days". Pt soiled bed while attempting to get to EOB. Tech was notified and pt was cleaned. Pt agreed to continue session and performed a few exercises in chair (Sit-stand 1x, standing march, and seated leg kicks). Pt was mod assist in sit-to-stand. Overall, pt tolerated session well. Pt remains in recliner post-session with chair alarm set, and call bell in reach. Nurse was notified of performance.  Kathee Delton Mobility Specialist 04/06/20, 1:25 PM

## 2020-04-06 NOTE — Telephone Encounter (Signed)
Noted. Will follow as appropriate.  

## 2020-04-06 NOTE — Discharge Summary (Signed)
[facility] [place] NAME: Jordan Perkins    MR#:  [id number]  DATE OF BIRTH:  1941-03-10  DATE OF ADMISSION:  03/23/2020 ADMITTING PHYSICIAN: Ivor Costa, MD  DATE OF DISCHARGE: 04/06/2020  PRIMARY CARE PHYSICIAN: [name], MD    ADMISSION DIAGNOSIS:  Anasarca [R60.1] Bradycardia [R00.1] Acute on chronic diastolic CHF (congestive heart failure) (HCC) [I50.33]  DISCHARGE DIAGNOSIS:  acute on chronic diastolic congestive heart failure Bradycardia-- asymptomatic a fib permanent-- now off Xarelto secondary to FOBT and falls recurrent falls with generalized weakness  SECONDARY DIAGNOSIS:   Past Medical History:  Diagnosis Date  . (HFpEF) heart failure with preserved ejection fraction ([place])   . Coronary artery disease   . Hyperlipidemia   . Hypertension   . PAH (pulmonary artery hypertension) ([place])   . Permanent atrial fibrillation ([place])   . Pleural effusion     HOSPITAL COURSE:  Patient is a 79 year old gentleman with history of hypertension, hyperlipidemia, A. fib on Xarelto, pulmonary artery hypertension, coronary artery disease, CABG, diastolic congestive heart failure, anemia, CKD stage III and possible dementia. Patient presented to the ED on 7/14 with complaint of multiple falls, generalized weakness and leg edema.  Acute on chronic diastolic congestive heart failure Chronic Pulmonary hypertension -Presented with generalized weakness, leg edema.  -Noted to have elevated BNP.  -Echo with known ejection fraction 60 to 65%.  -Pulmonary hypertension.  -Adequately diuresed with IV Lasix.  Given diuretic holiday due to elevated creatinine, now resolved.  Now on Lasix 40 mg qd - Euvolemic. UOP about 11 liters -sats 100% on Ra  AKI on CKD 2 -At baseline, creatinine less than 1.2 -With IV Lasix, creatinine trended up to peak at 1.53 on 7/23.  -Lasix, lisinopril and Aldactone were held--now resumed since creat  stable. -Resume Lasix.   -since blood pressure is stable will start on Aldactone and lisinopril resumed  Paroxysmal A. fib  -patient was on metoprolol and Xarelto at home.  -Metoprolol was discontinued because of concern about bradycardia.  -Heart rate is currently stable--51-57 -Xarelto was stopped because of positive FOBT, subsequent drop in hemoglobin as well as history of frequent falls.  Acute on chronic anemia -Hemoglobin at baseline between 10 to 11. Since admission, hemoglobin is dropping down, 7.6 this morning. -Noted that FOBT was positive and Xarelto was stopped. Currently on Protonix. No active bleeding. Continue to monitor hemoglobin. -started on po iron pills  Hyperlipidemia -On statin.  Hypokalemia/hyperkalemia -Normalized.  Advanced physical deconditioning:  -Due to multiple medical issues.  SNF recommended.  -Insurance peer to peer done--denied.  -at present patient's only option is going home with home health. Bowel care will start seeing patient from next Wednesday. Patient is aware.  Code Status:  Code Status: Full Code   Spoke with patient's friend Anise Salvo. According to the patient his brothers phone is not working. Patient will go home with home health.  Patient is at a very high risk for readmission given multiple medical problems and comorbidities CONSULTS OBTAINED:    DRUG ALLERGIES:   Allergies  Allergen Reactions  . Sulfa Antibiotics Rash    Rash/hives     DISCHARGE MEDICATIONS:   Allergies as of 04/06/2020      Reactions   Sulfa Antibiotics Rash   Rash/hives      Medication List    STOP taking these medications   hydrochlorothiazide 25 MG tablet Commonly known as: HYDRODIURIL   metoprolol tartrate 100 MG tablet Commonly known  as: LOPRESSOR   rivaroxaban 20 MG Tabs tablet Commonly known as: Xarelto     TAKE these medications   atorvastatin 80 MG tablet Commonly known as: LIPITOR TAKE 1 TABLET BY MOUTH EVERY  DAY   ferrous sulfate 325 (65 FE) MG tablet Take 1 tablet (325 mg total) by mouth daily.   furosemide 40 MG tablet Commonly known as: LASIX Take 40 mg by mouth daily.   lisinopril 40 MG tablet Commonly known as: ZESTRIL Take 1 tablet (40 mg total) by mouth daily.   spironolactone 25 MG tablet Commonly known as: ALDACTONE Take 1 tablet (25 mg total) by mouth daily. Start taking on: April 07, 2020       If you experience worsening of your admission symptoms, develop shortness of breath, life threatening emergency, suicidal or homicidal thoughts you must seek medical attention immediately by calling 911 or calling your MD immediately  if symptoms less severe.  You Must read complete instructions/literature along with all the possible adverse reactions/side effects for all the Medicines you take and that have been prescribed to you. Take any new Medicines after you have completely understood and accept all the possible adverse reactions/side effects.   Please note  You were cared for by a hospitalist during your hospital stay. If you have any questions about your discharge medications or the care you received while you were in the hospital after you are discharged, you can call the unit and asked to speak with the hospitalist on call if the hospitalist that took care of you is not available. Once you are discharged, your primary care physician will handle any further medical issues. Please note that NO REFILLS for any discharge medications will be authorized once you are discharged, as it is imperative that you return to your primary care physician (or establish a relationship with a primary care physician if you do not have one) for your aftercare needs so that they can reassess your need for medications and monitor your lab values. Today   SUBJECTIVE   I'm feeling okay but week.  VITAL SIGNS:  Blood pressure (!) 139/55, pulse 57, temperature 98.1 F (36.7 C), resp. rate 17, height 5'  11" (1.803 m), weight 75.8 kg, SpO2 100 %.  I/O:    Intake/Output Summary (Last 24 hours) at 04/06/2020 1340 Last data filed at 04/06/2020 1023 Gross per 24 hour  Intake 600 ml  Output 1800 ml  Net -1200 ml    PHYSICAL EXAMINATION:  GENERAL:  79 y.o.-year-old patient lying in the bed with no acute distress. Frail and weak EYES: Pupils equal, round, reactive to light and accommodation. No scleral icterus.  HEENT: Head atraumatic, normocephalic. Oropharynx and nasopharynx clear.  NECK:  Supple, no jugular venous distention. No thyroid enlargement, no tenderness.  LUNGS: decreased breath sounds bilaterally, no wheezing, rales,rhonchi or crepitation. No use of accessory muscles of respiration.  CARDIOVASCULAR: S1, S2 normal. No murmurs, rubs, or gallops.  ABDOMEN: Soft, non-tender, non-distended. Bowel sounds present. No organomegaly or mass.  EXTREMITIES: No pedal edema, cyanosis, or clubbing.  NEUROLOGIC: Cranial nerves II through XII are intact. Muscle strength 5/5 in all extremities. Sensation intact. Gait not checked. Deconditioned PSYCHIATRIC: The patient is alert and oriented x 3.  SKIN: No obvious rash, lesion, or ulcer.   DATA REVIEW:   CBC  Recent Labs  Lab 04/04/20 0418  WBC 7.6  HGB 7.6*  HCT 22.9*  PLT 163    Chemistries  Recent Labs  Lab 04/03/20 0414 04/03/20  1224 04/04/20 0418  NA 133*   < > 135  K 5.3*   < > 4.8  CL 103   < > 105  CO2 26   < > 27  GLUCOSE 131*   < > 116*  BUN 49*   < > 40*  CREATININE 1.34*   < > 1.06  CALCIUM 8.1*   < > 8.1*  MG 2.1  --   --    < > = values in this interval not displayed.    Microbiology Results   Recent Results (from the past 240 hour(s))  Urine Culture     Status: Abnormal   Collection Time: 03/28/20 11:34 PM   Specimen: Urine, Random  Result Value Ref Range Status   Specimen Description   Final    URINE, RANDOM Performed at [facility], [address] [place], [place] [address]    Special  Requests   Final    NONE Performed at [facility], [place], [place] [address]    Culture 60,000 COLONIES/mL ESCHERICHIA COLI (A)  Final   Report Status 03/30/2020 FINAL  Final   Organism ID, Bacteria ESCHERICHIA COLI (A)  Final      Susceptibility   Escherichia coli - MIC*    AMPICILLIN <=2 SENSITIVE Sensitive     CEFAZOLIN <=4 SENSITIVE Sensitive     CEFTRIAXONE <=0.25 SENSITIVE Sensitive     CIPROFLOXACIN <=0.25 SENSITIVE Sensitive     GENTAMICIN <=1 SENSITIVE Sensitive     IMIPENEM <=0.25 SENSITIVE Sensitive     NITROFURANTOIN <=16 SENSITIVE Sensitive     TRIMETH/SULFA <=20 SENSITIVE Sensitive     AMPICILLIN/SULBACTAM <=2 SENSITIVE Sensitive     PIP/TAZO <=4 SENSITIVE Sensitive     * 60,000 COLONIES/mL ESCHERICHIA COLI    RADIOLOGY:  No results found.   CODE STATUS:     Code Status Orders  (From admission, onward)         Start     Ordered   03/23/20 1738  Full code  Continuous        03/23/20 1737        Code Status History    Date Active Date Inactive Code Status Order ID Comments User Context   02/16/2019 1950 02/18/2019 1828 Full Code [id number]  Gorden Harms, MD Inpatient   10/29/2016 1702 11/01/2016 1934 Full Code [id number]  Epifanio Lesches, MD ED   Advance Care Planning Activity       TOTAL TIME TAKING CARE OF THIS PATIENT: *35* minutes.    Fritzi Mandes M.D  [facility]    CC: Primary care physician; [name], MD

## 2020-04-08 ENCOUNTER — Other Ambulatory Visit: Payer: Self-pay | Admitting: *Deleted

## 2020-04-08 ENCOUNTER — Encounter: Payer: Self-pay | Admitting: *Deleted

## 2020-04-08 ENCOUNTER — Telehealth: Payer: Self-pay

## 2020-04-08 NOTE — Patient Outreach (Signed)
[facility]) Care Management  04/08/2020  Jaysion Ramseyer October 17, 1940 [id number]   Error (wrong department entry)  Joelene Millin L. Lavina Hamman, RN, BSN, [place] Coordinator Office number [phone] Mobile number [phone]  Main [facility] number [phone] Fax number [phone]

## 2020-04-08 NOTE — Telephone Encounter (Signed)
Transition Care Management Follow-up Telephone Call  Date of discharge and from where: 04/06/20 from [facility]  How have you been since you were released from the hospital? Patient states, "Slowly walking but no falls. It is hard for me to walk and stand and cook but my brother helps me."  Awaiting HH to begin; notes they have reached out. Input/output appropriate.   Any questions or concerns? No   Items Reviewed:  Did the pt receive and understand the discharge instructions provided? Yes , increase activity as tolerated. Stay hydrated.   Medications obtained and verified? Yes   Any new allergies since your discharge? No   Dietary orders reviewed? Yes, low sodium diet, heart healthy  Do you have support at home? Yes , brother  Functional Questionnaire: (I = Independent and D = Dependent) ADLs: i  Bathing/Dressing- i  Meal Prep- brother assists with meal prep. Meals on wheels.   Eating- i  Maintaining continence-   Transferring/Ambulation- walker in use  Managing Meds- i  Follow up appointments reviewed:   PCP Hospital f/u appt confirmed? Yes  Scheduled to see Dr. Caryl Bis on 04/15/20 @ 11:30.  [facility] f/u appt confirmed? Yes  Scheduled to see Cardiology on 04/14/20.  Are transportation arrangements needed? No   If their condition worsens, is the pt aware to call PCP or go to the Emergency Dept.? Yes  Was the patient provided with contact information for the PCP's office or ED? Yes  Was to pt encouraged to call back with questions or concerns? Yes

## 2020-04-08 NOTE — Telephone Encounter (Signed)
Reviewed

## 2020-04-08 NOTE — Patient Outreach (Signed)
[facility]) Care Management  04/08/2020  Jordan Perkins [birth year] [id number]   [facility] Telephone Assessment/Screen for MD referral  Referral Date: 04/05/20 Referral Source: Jordan Clay, RN PCP: Jordan Perkins Jordan Perkins Referral Reason:  Heart failure would benefit from outpatient case management through [place]: [place] access   Last admission from [facility] 03/23/20 to 04/06/20 anasarca, bradycardia, acute on chronic diastolic congestive Heart Failure (CHF)     Outreach attempt #1 successful to [phone] Patient is able to verify HIPAA, DOB and address Reviewed and addressed referral to [facility] with patient  Transition of care services noted to be completed by primary care MD office staff- Jordan Perkins Health care at [facility] of Care will be completed by primary care provider office who will refer to [facility] care management if needed.  THN new patient screening  Jordan Perkins reports he is doing good but remains a little weak and continues to use his walker for mobility He denies worsening symptoms  He has food and denies other social needs at this time Reviewed pcp f/u appointment on next week  He was unsure if he had a ride but will speak with his neighbor or call THN RN CM back  Left him [facility] RN Cm office number He wrote down his appointment and CM number  He confirms he is suppose to start Home health [facility]) next week  his phone was off for 3-4 days now back on   [facility] RN CM interventions  THN RN CM called [facility] [phone] with patient  A message left to request [facility] RN [phone] with Jordan Perkins to check on possible spironolactone Prescription (Rx)  Informed pharmacy that his to stop taking xarelto, metoprolol and spironolactone Spoke with Jordan Perkins  Who confirms Jordan Perkins had 5 Rx ready and two of those were xarelto and hydrochlorothiazide He confirms Jordan Perkins can come to pick up ferrous  sulfate, atorvastatin, spironolactone  Social: Jordan Perkins is a 79 year old male who lives with his brother, Jordan Perkins  He nor his brother does not drive. His brother uses a scooter/moped He has a neighbor Jordan Perkins who assists with transportation to medical appointments, pharmacy and for groceries He reports being able to complete  Activities of daily living (ADLs) slowly.    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) Atrial fibrillation (permanent) now off Xarelto secondary to FOBT and falls recurrent falls with generalized weakness,  Coronary artery disease, Hyperlipidemia,  Hypertension, PAH (pulmonary artery hypertension), Pleural effusion Hard of hearing (HOH) Reports he got both covid shots at [facility] in June 2021    DME: glasses, walker   Medications: Reviewed his medications with [facility] RN CM Discussed he is to stop taking Metoprolol, hydrochlorothiazide, xarelto  Had him put an x on the pill bottles or to remove them to remind him not to take them  Had him repeat which medicines he is not to take He stating them correctly He is aware to only take atorvastatin, furosemide and to pick up others from his pharmacy Running low on furosemide Has 11 pills left Needs a refill  He and [facility] RN CM discussed he needs to request a refill on 04/14/20 from cardiology Perkins He voiced understanding [facility] He does not have ferrous sulfate nor spironolactone per discharge sheet Reports on 04/08/20 he has not purchased yet  Had him to write down ferrous sulfate to be picked up over the counter (OTC) and spironolactone that is needed [facility] Conference call to [facility] with patient   Appointments: 04/14/20 0930 Cardiology Jordan Perkins (Jordan Candis Musa MD) 04/15/20 1030 cardiology [facility] Heart failure clinic near [place] office  04/15/20 1130 primary care provider (PCP) Jordan Perkins 05/30/20 1130 annual wellness  visit Jordan Perkins  Advance Directives:  He denies need for assist with advance directives   Consent: [facility] RN CM reviewed [facility] services with patient. Patient gave verbal consent for services [facility] telephonic RN CM.   Plan: [facility] RN CM will follow with Jordan Perkins within the next 7-14 business days to follow up his MD appointments/meds, further assess for care coordination needs and review disease management [facility] RN CM sent a [place] with [facility] brochure, [employer], [facility] consent form with return envelope and know before you go sheet enclosed for review MD involvement barriers letter sent  Routed note to MD  Goals Addressed              This Visit's Progress     Patient Stated   .  Patient will be able to verbalize management of CHF at home as evidence by no readmission and decrease in worsening symptoms (pt-stated)        CARE PLAN ENTRY (see longtitudinal plan of care for additional care plan information)   Current Barriers:  Marland Kitchen Knowledge deficit related to basic heart failure pathophysiology and self care management . Cognitive Deficits . Hard of hearing   Case Manager Clinical Goal(s):  Marland Kitchen Over the next 90 days, patient will verbalize understanding of Heart Failure Action Plan and when to call doctor . Over the next 30 days, patient will take all Heart Failure mediations as prescribed  Interventions:  . Basic overview and discussion of pathophysiology of Heart Failure reviewed  . Reviewed role of diuretics in prevention of fluid overload and management of heart failure  Patient Self Care Activities:  . Takes Heart Failure Medications as prescribed  Initial goal documentation        Jordan Olivier L. Lavina Hamman, RN, BSN, [place] Coordinator Office number [phone] Mobile number [phone]  Main THN number [phone] Fax number [phone]

## 2020-04-12 DIAGNOSIS — I5032 Chronic diastolic (congestive) heart failure: Secondary | ICD-10-CM | POA: Diagnosis not present

## 2020-04-12 DIAGNOSIS — E1122 Type 2 diabetes mellitus with diabetic chronic kidney disease: Secondary | ICD-10-CM | POA: Diagnosis not present

## 2020-04-12 DIAGNOSIS — I4891 Unspecified atrial fibrillation: Secondary | ICD-10-CM | POA: Diagnosis not present

## 2020-04-12 DIAGNOSIS — I4821 Permanent atrial fibrillation: Secondary | ICD-10-CM | POA: Diagnosis not present

## 2020-04-12 DIAGNOSIS — I2510 Atherosclerotic heart disease of native coronary artery without angina pectoris: Secondary | ICD-10-CM | POA: Diagnosis not present

## 2020-04-12 DIAGNOSIS — D631 Anemia in chronic kidney disease: Secondary | ICD-10-CM | POA: Diagnosis not present

## 2020-04-12 DIAGNOSIS — E1151 Type 2 diabetes mellitus with diabetic peripheral angiopathy without gangrene: Secondary | ICD-10-CM | POA: Diagnosis not present

## 2020-04-12 DIAGNOSIS — E785 Hyperlipidemia, unspecified: Secondary | ICD-10-CM | POA: Diagnosis not present

## 2020-04-12 DIAGNOSIS — N1831 Chronic kidney disease, stage 3a: Secondary | ICD-10-CM | POA: Diagnosis not present

## 2020-04-12 DIAGNOSIS — I130 Hypertensive heart and chronic kidney disease with heart failure and stage 1 through stage 4 chronic kidney disease, or unspecified chronic kidney disease: Secondary | ICD-10-CM | POA: Diagnosis not present

## 2020-04-12 DIAGNOSIS — I700 Atherosclerosis of aorta: Secondary | ICD-10-CM | POA: Diagnosis not present

## 2020-04-12 DIAGNOSIS — E871 Hypo-osmolality and hyponatremia: Secondary | ICD-10-CM | POA: Diagnosis not present

## 2020-04-13 ENCOUNTER — Other Ambulatory Visit: Payer: Self-pay | Admitting: *Deleted

## 2020-04-13 ENCOUNTER — Telehealth: Payer: Self-pay | Admitting: Family Medicine

## 2020-04-13 NOTE — Telephone Encounter (Signed)
Verbal order can be given.  

## 2020-04-13 NOTE — Telephone Encounter (Signed)
Called and LVM for verbal orders for occupational therapy for patient.  Emerlyn Mehlhoff,cma

## 2020-04-13 NOTE — Patient Outreach (Signed)
[facility]) Care Management  04/13/2020  Jordan Perkins [birth year] [id number]   EMMI-general discharge RED ON EMMI ALERT Day # 4 Date: 04/11/20 1011 Red Alert Reason: Know who to call about changes in condition? No Wounds healing well? No Insurance: [place] access [facility] x 1 ED visits x 1 in the last 6 months Last admission from [facility] 03/23/20 to 04/06/20 anasarca, bradycardia, acute on chronic diastolic congestive Heart Failure (CHF)   Jordan Perkins was already active with [facility] since a 04/05/20 Referral from Doran Clay, RN PCP: Caryl Bis, ERIC G Referral Reason:  Heart failure would benefit from outpatient case management through [place] attempt successful to the home number [phone] Patient is able to verify HIPAA, DOB and address [facility] RN reviewed and addressed red alert with patient  Transition of care services noted to be completed by primary care MD office staff- Dr Lynann Bologna Health care at [place] will be completed by primary care provider office who will refer to [facility] care management if needed.  EMMI:  Know who to call about changes in condition- Jordan Perkins reports this EMMI answer is correct  He also reports some memory concerns as evidence by he could not recall to outreach to [facility] RN CM if he did not get transportation to upcoming appointment on 04/14/20   Wounds healing well- He reports this EMMI answer is related to him continuing to have swelling of his ankles  Jordan Perkins confirms the [facility] Rn is visiting and has discussed with him that he    [facility] RN CM intervention: THN RN CM discussed Chronic Kidney disease (CKD),  THN RN CM completed a conference call with Jordan Perkins to [employer] transportation authority [phone] and spoke with Neil Crouch reports a shortage in transporters and encouraged a call to [employer]  transportation at [phone] An attempt to reach [employer] transportation but without success  He was encouraged to call his neighbor and gave [facility] RN CM permission to call his neighbor as needed and to send a [facility] SW referral  Social: Jordan Perkins is a 79 year old male who lives with his brother, [name]  He nor his brother does not drive. His brother uses a scooter/moped and does not read well Jordan Perkins reads well but is Hard of hearing [facility]) and has minimal support services.  He has a neighbor [name] who assists with transportation to medical appointments, pharmacy and for groceries when available He reports he is in walking distance of some of his MDs but is not ambulating well at this time He reports being able to complete  Activities of daily living (ADLs) slowly.    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) Atrial fibrillation (permanent) now off Xarelto secondary to FOBT and falls recurrent falls with generalized weakness,  Coronary artery disease, Hyperlipidemia,  Hypertension, PAH (pulmonary artery hypertension), Pleural effusion Hard of hearing [facility]) Reports he got both covid shots at [facility] in June 2021    DME: glasses, walker   Appointments: 04/14/20 0930 Cardiology Laurann [place] NP (timothy Candis Musa MD) 04/15/20 1030 cardiology [facility] Heart failure clinic near [place] office  04/15/20 1130 primary care provider (PCP) E Sonnenberg 05/30/20 1130 annual wellness visit Denisa O'brien  Advance Directives:  He denies need for assist with advance directives  Consent: THN RN CM reviewed [facility] services with patient. Patient gave verbal consent for services [facility] telephonic RN CM. Advised patient that there will be further automated EMMI- post discharge calls to assess how the patient is doing following the recent hospitalization Advised the patient that another call may be  received from a nurse if any of their responses were abnormal. Patient voiced understanding and was appreciative of f/u call.   Plan: [facility] RN CM will follow with Jordan Gains within the next 7-14 business days to follow up his MD appointments/meds, further assess for care coordination needs and review disease management Pt encouraged to return a call to [facility] RN CM prn Routed note to MD  Joelene Millin L. Lavina Hamman, RN, BSN, [place] Coordinator Office number [phone] Mobile number [phone]  Main THN number [phone] Fax number [phone]

## 2020-04-13 NOTE — Telephone Encounter (Signed)
Occupational health therapy called need order to continue [place] home care

## 2020-04-13 NOTE — Telephone Encounter (Signed)
Occupational health therapy called need order to continue [place] home care.  Dharma Pare,cma

## 2020-04-13 NOTE — Patient Outreach (Signed)
[facility]) Care Management  04/13/2020  Jordan Perkins 04/22/1941 [id number]   [place] coordination Transportation for this week medical appointments   [facility] RN CM called back to [facility] transportation program Received a voice message that indicated to leave a message and reported that transportation needs to be called in 3 days in advance for locally appointments   [facility] RN CM left a voice message to request a call to [place] or Jordan Jordan Perkins to get assist with application/set up for future medicaid transportation services  Jordan Jordan Perkins gave [facility] RN CM permission this am to outreach to Jordan Jordan Perkins  Jordan Jordan Perkins was reached at [phone] and graciously agreed to assist Jordan Jordan Perkins to his appointments this week. He reports speaking with Jordan Jordan Perkins this morning but Jordan Jordan Perkins had not mentioned assistance to medical appointments Jordan Jordan Perkins reports he is Jordan Jordan Perkins's friend, neighbor, landlord and "everything right now. He is going through a lot at this time."   [facility] RN CM updated the assigned THN SW via Epic in basket of THN RN CM ([place]) interventions for the appointments on this week. THN RN CM will continue to collaborate with [facility] SW   Patty from [facility] [phone]) returned a call to [place] she tried to call Jordan Jordan Perkins today unsuccessfully and he has a voice message that has not been set up at this time. Patty reports she would be willing to assist him. She reports needing to get more information from Jordan Jordan Perkins on his needs and will call him for phone interview/ She reports [facility] RN CM would not be able to assist with answering questions for Jordan Jordan Perkins [facility] RN CM offered to complete a conference call with Jordan Jordan Perkins and Jordan Perkins but not a convenient time for her  Outreach attempt to Jordan Perkins to update him on Jordan Jordan Perkins to assist with his appointments and encourage him to answer the phone for Jordan Perkins transportation staff  No  answer and [facility] RN CM was not able to leave a voice message as there is no mail box    Plan: [facility] RN CM will follow with Jordan Perkins within the next 7-14 business days to follow up his MD appointments/meds, further assess for care coordination needs and review disease management Routed note to MD   Goals Addressed              This Visit's Progress     Patient Stated   .  Patient will be able to verbalize management of CHF at home as evidence by no readmission and decrease in worsening symptoms (pt-stated)   Not on track     [place] (see longtitudinal plan of care for additional care plan information)   Current Barriers:  Marland Kitchen Knowledge deficit related to basic heart failure pathophysiology and self care management . Cognitive Deficits . Hard of hearing  . Transportation to medical appointments  Case Manager Clinical Goal(s):  Marland Kitchen Over the next 90 days, patient will verbalize understanding of Heart Failure Action Plan and when to call doctor . Over the next 30 days, patient will take all Heart Failure mediations as prescribed  Interventions:  . Basic overview and discussion of pathophysiology of Heart Failure reviewed  . Provided verbal education on low sodium diet . Discussed importance of daily weight and advised patient to weigh and record daily . Reviewed role of diuretics in prevention of fluid overload  and management of heart failure . [facility] SW referral for transportation needs, [facility] SW evaluation of [place] needs for [place] patient, possible Personal care services (PCS) aide services  .   Patient Self Care Activities:  . Takes Heart Failure Medications as prescribed . Verbalizes understanding of and follows CHF Action Plan . Adheres to low sodium diet  Initial goal documentation        Leonard Jordan L. Lavina Hamman, RN, BSN, [place] Coordinator Office number [phone] Mobile number [phone]  Main THN number [phone] Fax  number [phone]

## 2020-04-14 ENCOUNTER — Ambulatory Visit (INDEPENDENT_AMBULATORY_CARE_PROVIDER_SITE_OTHER): Payer: Medicare HMO

## 2020-04-14 ENCOUNTER — Telehealth: Payer: Self-pay | Admitting: Family

## 2020-04-14 ENCOUNTER — Other Ambulatory Visit: Payer: Self-pay

## 2020-04-14 ENCOUNTER — Encounter: Payer: Self-pay | Admitting: Family

## 2020-04-14 ENCOUNTER — Ambulatory Visit (INDEPENDENT_AMBULATORY_CARE_PROVIDER_SITE_OTHER): Payer: Medicare HMO | Admitting: Family

## 2020-04-14 VITALS — BP 134/50 | HR 55 | Ht 71.0 in | Wt 169.8 lb

## 2020-04-14 DIAGNOSIS — D649 Anemia, unspecified: Secondary | ICD-10-CM | POA: Diagnosis not present

## 2020-04-14 DIAGNOSIS — I10 Essential (primary) hypertension: Secondary | ICD-10-CM

## 2020-04-14 DIAGNOSIS — I5032 Chronic diastolic (congestive) heart failure: Secondary | ICD-10-CM

## 2020-04-14 DIAGNOSIS — I4891 Unspecified atrial fibrillation: Secondary | ICD-10-CM

## 2020-04-14 DIAGNOSIS — I25118 Atherosclerotic heart disease of native coronary artery with other forms of angina pectoris: Secondary | ICD-10-CM

## 2020-04-14 MED ORDER — SPIRONOLACTONE 25 MG PO TABS: 25.0000 mg | ORAL_TABLET | Freq: Every day | ORAL | 3 refills | Status: DC

## 2020-04-14 MED ORDER — FUROSEMIDE 40 MG PO TABS: 40.0000 mg | ORAL_TABLET | Freq: Every day | ORAL | 3 refills | Status: DC

## 2020-04-14 NOTE — Telephone Encounter (Signed)
Unable to reach patient to check on him since he left hospital. He requested wants to cancel through the automated system for tomorrows new patient Appt. Plan to leave appointment up until patient either reschedules or verifys on phone that he wants to cancel.   Jordan Perkins, NT

## 2020-04-14 NOTE — Progress Notes (Signed)
Office Visit    Patient Name: Jordan Perkins Date of Encounter: 04/15/2020  Primary Care Provider:  [name], MD Primary Cardiologist:  No primary care provider on file. Electrophysiologist:  None   Chief Complaint    Jordan Perkins is a 79 y.o. male with a hx of HTN, HLD, permanent atrial fibrillation, pulmonary hypertension, CAD s/p CABG, diastolic congestive heart failure, anemia, CKD 3 presents today for hospital follow up.   Past Medical History    Past Medical History:  Diagnosis Date  . (HFpEF) heart failure with preserved ejection fraction ([place])   . Coronary artery disease   . Hyperlipidemia   . Hypertension   . PAH (pulmonary artery hypertension) ([place])   . Permanent atrial fibrillation ([place])   . Pleural effusion    Past Surgical History:  Procedure Laterality Date  . CARDIAC CATHETERIZATION    . CHOLECYSTECTOMY    . CORONARY ANGIOPLASTY  06/2004   s/p stent placement @ [facility]  . CORONARY ARTERY BYPASS GRAFT  04-24-2004   CABG x 3 [facility]    Allergies  Allergies  Allergen Reactions  . Sulfa Antibiotics Rash    Rash/hives     History of Present Illness    [name] is a 79 y.o. male with a hx of HTN, HLD, permanent atrial fibrillation, pulmonary hypertension, CAD s/p CABG, diastolic congestive heart failure, anemia, CKD 3 last seen via telemedicine 12/2018 by Dr. Rockey Situ.  He was admitted 7/14 through 04/06/2020 [facility] due to anasarca, acute on chronic diastolic heart failure, bradycardia.  His bradycardia was asymptomatic.  His metoprolol was discontinued.  His hydrochlorothiazide was discontinued.  His Xarelto was stopped due to FOBT and falls.  He had recurrent falls with generalized weakness.  He was discharged on Lasix 40 mg daily, lisinopril 40 mg daily, spironolactone 25 mg daily.  His weight on date of discharge was 75.8 kg.  Labs 04/04/2020 with hemoglobin 7.6, creatinine 1.06, GFR greater than 60.  Reports feeling overall well  since hospital discharge. He lives with his brother. He does not drive and their landlord/friend has been helping him get to and from multiple appointments.  Reports no melena, hematuria. Reports no shortness of breath at rest. Reports his dyspnea on exertion is stable compared to discharge. Reports his fatigue is somewhat improving. His weight is up 2 pounds compared to hospital discharge. He does not have a scale at home but plans to purchase one.  He gets Meals on Wheels and tells me he believes his meals are low in salt. He drinks approximately 3 bottles of water per day.  Reports no lightheadedness, dizziness, near-syncope, syncope. He reports no recurrent falls. Tells me he falls prior to his most recent admission were due to "tripping over his feet ".  EKGs/Labs/Other Studies Reviewed:   The following studies were reviewed today:  Echo 03/23/2020 1. Left ventricular ejection fraction, by estimation, is 60 to 65%. The  left ventricle has normal function. The left ventricle demonstrates global  hypokinesis. Left ventricular diastolic parameters are indeterminate.   2. Right ventricular systolic function is mildly reduced. The right  ventricular size is mildly enlarged. There is severely elevated pulmonary  artery systolic pressure. The estimated right ventricular systolic  pressure is 119.4 mmHg.   3. Left atrial size was mildly dilated.   4. Right atrial size was mildly dilated.   5. The mitral valve is grossly normal. Mild mitral valve regurgitation.   6. The aortic valve is  tricuspid. Aortic valve regurgitation is trivial.  Mild aortic valve sclerosis is present, with no evidence of aortic valve  stenosis.   7. The inferior vena cava is dilated in size with <50% respiratory  variability, suggesting right atrial pressure of 15 mmHg.   EKG:  EKG is ordered today.  The ekg ordered today demonstrates atrial fibrillation rate 55 bpm with rightward axis and nonspecific T wave  changes  Recent Labs: 04/03/2020: Magnesium 2.1 04/14/2020: ALT 15; BNP WILL FOLLOW; BUN 25; Creatinine, Ser 1.04; Hemoglobin 8.4; Platelets 231; Potassium 4.2; Sodium 136  Recent Lipid Panel    Component Value Date/Time   CHOL 75 03/24/2020 0434   CHOL 89 (L) 04/06/2015 1642   CHOL 150 06/06/2013 0947   TRIG 42 03/24/2020 0434   TRIG 85 06/06/2013 0947   HDL 21 (L) 03/24/2020 0434   HDL 40 04/06/2015 1642   HDL 43 06/06/2013 0947   CHOLHDL 3.6 03/24/2020 0434   VLDL 8 03/24/2020 0434   VLDL 17 06/06/2013 0947   LDLCALC 46 03/24/2020 0434   LDLCALC 36 04/06/2015 1642   LDLCALC 90 06/06/2013 0947    Home Medications   Current Meds  Medication Sig  . atorvastatin (LIPITOR) 80 MG tablet TAKE 1 TABLET BY MOUTH EVERY DAY  . ferrous sulfate 325 (65 FE) MG tablet Take 1 tablet (325 mg total) by mouth daily.  . furosemide (LASIX) 40 MG tablet Take 1 tablet (40 mg total) by mouth daily.  Marland Kitchen lisinopril (ZESTRIL) 40 MG tablet Take 1 tablet (40 mg total) by mouth daily.  Marland Kitchen spironolactone (ALDACTONE) 25 MG tablet Take 1 tablet (25 mg total) by mouth daily.  . [DISCONTINUED] furosemide (LASIX) 40 MG tablet Take 40 mg by mouth daily.  . [DISCONTINUED] spironolactone (ALDACTONE) 25 MG tablet Take 1 tablet (25 mg total) by mouth daily.      Review of Systems      Review of Systems  Constitutional: Negative for chills, fever and malaise/fatigue.  Cardiovascular: Positive for dyspnea on exertion and leg swelling. Negative for chest pain, irregular heartbeat, near-syncope, orthopnea, palpitations and syncope.  Respiratory: Negative for cough, shortness of breath and wheezing.   Gastrointestinal: Negative for melena, nausea and vomiting.  Genitourinary: Negative for hematuria.  Neurological: Negative for dizziness, light-headedness and weakness.   All other systems reviewed and are otherwise negative except as noted above.  Physical Exam    VS:  BP (!) 134/50 (BP Location: Left Arm,  Patient Position: Sitting, Cuff Size: Normal)   Pulse (!) 55   Ht 5\' 11"  (1.803 m)   Wt 169 lb 12.8 oz (77 kg)   SpO2 98%   BMI 23.68 kg/m  , BMI Body mass index is 23.68 kg/m. GEN: Thin, well developed, in no acute distress. HEENT: normal. Neck: Supple, no JVD, carotid bruits, or masses. Cardiac: irregularly irregular, no murmurs, rubs, or gallops. No clubbing, cyanosis. Nonpitting bilateral LE edema..  Radials/DP/PT 2+ and equal bilaterally.  Respiratory:  Respirations regular and unlabored, clear to auscultation bilaterally. GI: Soft, nontender, nondistended MS: No deformity or atrophy. Skin: Warm and dry, no rash. Neuro:  Strength and sensation are intact. Psych: Normal affect.  Assessment & Plan    1. Permanent atrial fibrillation, bradycardia -heart rate 55 bpm today in permanent atrial fibrillation despite discontinuation of metoprolol. That he denies lightheadedness, dizziness, near syncope he has had recurrent falls and as such14-day ZIO monitor placed in clinic today to exclude posttermination pauses or high degree heart block.  2.  Chronic anticoagulation/FOBT/anemia - Hb on discharge 7.6 on repeat after clinic visit 8.4. He had positive FOBT in the hospital and his Xarelto was discontinued. He reports no hematuria nor melena. Has follow-up with PCP tomorrow and may require referral to gastroenterology vs hematology. Appears to have been a gradual decline in his hemoglobin over the last year. I will discuss resumption of his Xarelto with his primary cardiologist. CHADS2VASc score of 5.   3. HFpEF -appears euvolemic and well compensated on exam. Nonpitting lower extremity edema. Does endorse keeping his lower extremities elevated. His Meals on Wheels are low-sodium. He drinks less than 2 L of fluid per day. Continue present diuretic dosing including Lasix 40 mg daily and Aldactone 25 mg daily. Refills provided. CMP and BNP for monitoring today. NYHA II.  4. CAD s/p CABG -stable no  anginal symptoms. No negation for ischemic evaluation. No beta-blocker due to bradycardia. Continue present statin.  5. HTN - BP well controlled. Continue current antihypertensive regimen.   Disposition: Follow up in 2 week(s) with Dr. Rockey Situ or APP and in 1 month with [facility].    Loel Dubonnet, NP 04/15/2020, 7:58 AM

## 2020-04-14 NOTE — Patient Instructions (Addendum)
Medication Instructions:  Your physician recommends that you continue on your current medications as directed. Please refer to the Current Medication list given to you today.  *If you need a refill on your cardiac medications before your next appointment, please call your pharmacy*   Lab Work: Your physician recommends lab work today: BNP, CMP, CBC  If you have labs (blood work) drawn today and your tests are completely normal, you will receive your results only by: Marland Kitchen MyChart Message (if you have MyChart) OR . A paper copy in the mail If you have any lab test that is abnormal or we need to change your treatment, we will call you to review the results.   Testing/Procedures: Recommend you wear your heart monitor for 2 weeks. You should mail it back on August 19th. A zio monitor was placed today. It will remain on for 14 days. You will then return monitor and event diary in provided box. It takes 1-2 weeks for report to be downloaded and returned to [place]. We will call you with the results. If monitor falls of or has orange flashing light, please call Zio for further instructions.      Follow-Up: At [facility], you and your health needs are our priority.  As part of our continuing mission to provide you with exceptional heart care, we have created designated Provider Care Teams.  These Care Teams include your primary Cardiologist (physician) and Advanced Practice Providers (APPs -  Physician Assistants and Nurse Practitioners) who all work together to provide you with the care you need, when you need it.  We recommend signing up for the patient portal called "MyChart".  Sign up information is provided on this After Visit Summary.  MyChart is used to connect with patients for Virtual Visits (Telemedicine).  Patients are able to view lab/test results, encounter notes, upcoming appointments, etc.  Non-urgent messages can be sent to your provider as well.   To learn more about what you can do with  MyChart, go to [email or web address].    Your next appointment:  Our office MD or app in 2 weeks  Heart failure clinic in 4 weeks

## 2020-04-15 ENCOUNTER — Other Ambulatory Visit: Payer: Self-pay | Admitting: *Deleted

## 2020-04-15 ENCOUNTER — Ambulatory Visit: Payer: Medicare HMO | Admitting: Family

## 2020-04-15 ENCOUNTER — Telehealth (INDEPENDENT_AMBULATORY_CARE_PROVIDER_SITE_OTHER): Payer: Medicare HMO | Admitting: Family Medicine

## 2020-04-15 ENCOUNTER — Encounter: Payer: Self-pay | Admitting: Family Medicine

## 2020-04-15 DIAGNOSIS — D539 Nutritional anemia, unspecified: Secondary | ICD-10-CM

## 2020-04-15 DIAGNOSIS — I480 Paroxysmal atrial fibrillation: Secondary | ICD-10-CM | POA: Diagnosis not present

## 2020-04-15 DIAGNOSIS — R195 Other fecal abnormalities: Secondary | ICD-10-CM

## 2020-04-15 DIAGNOSIS — I5032 Chronic diastolic (congestive) heart failure: Secondary | ICD-10-CM

## 2020-04-15 LAB — COMPREHENSIVE METABOLIC PANEL
ALT: 15 IU/L (ref 0–44)
AST: 24 IU/L (ref 0–40)
Albumin/Globulin Ratio: 0.7 — ABNORMAL LOW (ref 1.2–2.2)
Albumin: 3.4 g/dL — ABNORMAL LOW (ref 3.7–4.7)
Alkaline Phosphatase: 96 IU/L (ref 48–121)
BUN/Creatinine Ratio: 24 (ref 10–24)
BUN: 25 mg/dL (ref 8–27)
Bilirubin Total: 1.5 mg/dL — ABNORMAL HIGH (ref 0.0–1.2)
CO2: 24 mmol/L (ref 20–29)
Calcium: 9.1 mg/dL (ref 8.6–10.2)
Chloride: 100 mmol/L (ref 96–106)
Creatinine, Ser: 1.04 mg/dL (ref 0.76–1.27)
GFR calc Af Amer: 80 mL/min/{1.73_m2} (ref >59)
GFR calc non Af Amer: 69 mL/min/{1.73_m2} (ref >59)
Globulin, Total: 4.8 g/dL — ABNORMAL HIGH (ref 1.5–4.5)
Glucose: 101 mg/dL — ABNORMAL HIGH (ref 65–99)
Potassium: 4.2 mmol/L (ref 3.5–5.2)
Sodium: 136 mmol/L (ref 134–144)
Total Protein: 8.2 g/dL (ref 6.0–8.5)

## 2020-04-15 LAB — CBC
Hematocrit: 24 % — ABNORMAL LOW (ref 37.5–51.0)
Hemoglobin: 8.4 g/dL — ABNORMAL LOW (ref 13.0–17.7)
MCH: 34.3 pg — ABNORMAL HIGH (ref 26.6–33.0)
MCHC: 35 g/dL (ref 31.5–35.7)
MCV: 98 fL — ABNORMAL HIGH (ref 79–97)
Platelets: 231 10*3/uL (ref 150–450)
RBC: 2.45 x10E6/uL — CL (ref 4.14–5.80)
RDW: 13.6 % (ref 11.6–15.4)
WBC: 6.2 10*3/uL (ref 3.4–10.8)

## 2020-04-15 LAB — BRAIN NATRIURETIC PEPTIDE: BNP: 283.3 pg/mL — ABNORMAL HIGH (ref 0.0–100.0)

## 2020-04-15 NOTE — Patient Outreach (Signed)
[facility]) Care Management  04/15/2020  Corrigan Kretschmer July 20, 1941 [id number]   CSW received referral on 04/13/2020 to assist pt with transportation needs. CSW made contact with pt today and confirmed pt identity. CSW introduced self, role and reason for call. Per pt, he and his brother live together and neither are driving.  CSW discussed community resources that may be of assistance for transportation needs; including Medicaid transportation and [employer].  Pt states he is not familiar with Medicaid services.  CSW will mail pt the resource material and plan a follow up call to discuss further.  Per pt, he has a virtual visit today with his provider.   CSW provided pt with contact # to call this CSW directly if he gets an appointment and has no ride so we can assist  him with this.     Eduard Clos, MSW, [place] Worker  [place] [phone]

## 2020-04-15 NOTE — Patient Outreach (Signed)
[facility] [facility]) Care Management  04/15/2020  Dock Baccam 1940/10/24 [id number]   [facility] outreach to EMMI referred patient/complex care services  Mr TIAN MCMURTREY was referred to [facility] on 04/05/20 for MD referral for  Referral Reason:Heart failure would benefit from outpatient case management through [facility] and then on 04/11/20 for EMMI general red alert for not knowing who to call about changes in condition and issues with wound healing- Resolved EMMI on 04/13/20  Insurance:[employer] medicare and medicaid  access [facility] admissions x1ED visits x 1in the last 6 months Last admission from [facility] 03/23/20 to 04/06/20 anasarca, bradycardia, acute on chronic diastoliccongestive Heart Failure (CHF)  Transition of care services noted to be completed by primary care MD office staff- Dr Lynann Bologna Health care at [place] will be completed by primary care provider office who will refer to [facility] care management if needed.  Patient is able to verify HIPAA (Foster and Accountability Act) identifiers, date of birth (DOB) and address Reviewed and addressed the purpose of the follow up call with the patient Today his brother was heard in the background briefly  Consent: [facility] RN CM reviewed [facility] services with patient. Patient gave verbal consent for services.  [facility] Follow up congestive Heart Failure (CHF) Assessed for CHF worsening symptoms  Reviewed diet He shared he generally eats corn flakes for every breakfast and takes in 4 glasses of fluid to include gatorade and water He and [facility] RN CM discussed foods he likes to include peanut butter, spinach, broccoli, turnips, collards  [facility] RN CM discussed and cautioned him about elevated intake of green leafy vegetables daily related to vitamin K that thickens the blood as he was previously on anticoagulant, Xarelto. He was encouraged to take these in on a  moderate basis.  [facility] RN CM again answered questions about what he can and can not eat (cheese, sausages  Transportation  [facility] RN CM reminded him to answer all his phone calls in case [place] of medicaid transportation or [facility] calls to assist him  DME [facility] RN CM discussed Airline pilot over the counter (OTC) program He recalls getting a catalog  Social:Mr KALIN KYLER is a 79 year old male who lives with his brother, [name] in a mobil home.trailer He nor his brother does not drive. His brother does not read or write well . 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  He has a neighbor [name] who assists with some reading of mail, transportation to medical appointments, pharmacy and for groceries when available He reports he is in walking distance of some of his MDs but is not ambulating well at this time He reports being able to completeActivities of daily living (ADLs) slowly.   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) Atrial fibrillation (permanent) now off Xarelato secondary to FOBT and falls, history of recurrent falls with generalized weakness, Coronary Artery Disease (CAD), Hyperlipidemia (HLD), Hypertension (HTN), pulmonary artery hypertension (PAH), pleural effusion, Hard of hearing [facility])  Reports he got both covid shots at [facility] in June 2021   DXA:JOINOMV, walkercane  Falls- reports falls x 3 with no injury only bruising in 2021  Plans  [facility] RN CM will follow up with  Mr [place] within the next 7 business days  Pt encouraged to return a call to [facility] RN CM prn Routed note to MD  Joelene Millin L. Lavina Hamman, RN, BSN, [place] Coordinator Office number [phone] Mobile number [phone]  Main [facility] number [phone] Fax number [phone]

## 2020-04-15 NOTE — Assessment & Plan Note (Signed)
Refer to GI.  Continue iron supplement.

## 2020-04-15 NOTE — Assessment & Plan Note (Signed)
Improved from his recent hospitalization.  He will continue his current medication regimen.  Discussed monitoring for any breathing issues or increased swelling and to seek medical attention or in the very least let us or cardiology know immediately if those things occur.

## 2020-04-15 NOTE — Progress Notes (Signed)
Virtual Visit via telephone Note  This visit type was conducted due to national recommendations for restrictions regarding the COVID-19 pandemic (e.g. social distancing).  This format is felt to be most appropriate for this patient at this time.  All issues noted in this document were discussed and addressed.  No physical exam was performed (except for noted visual exam findings with Video Visits).   I connected with Jordan Perkins today at 11:30 AM EDT by telephone and verified that I am speaking with the correct person using two identifiers. Location patient: home Location provider: work  Persons participating in the virtual visit: patient, provider  I discussed the limitations, risks, security and privacy concerns of performing an evaluation and management service by telephone and the availability of in person appointments. I also discussed with the patient that there may be a patient responsible charge related to this service. The patient expressed understanding and agreed to proceed.  Interactive audio and video telecommunications were attempted between this provider and patient, however failed, due to patient having technical difficulties OR patient did not have access to video capability.  We continued and completed visit with audio only.   Reason for visit: f/u  HPI: CHF: Recent exacerbation.  He was treated in the hospital with IV Lasix though this was held after AKI and then resumed oral Lasix once his kidney function improved.  He denies orthopnea, PND, shortness of breath, and chest pain.  He notes a little bit of ankle swelling that is much improved from when he went to the hospital.  He has followed up with cardiology.  He continues on Lasix 40 mg daily.  Home health has been working with him as well and he starts physical therapy with them next week.  Bradycardia/atrial fibrillation: Patient is off of a beta-blocker.  He saw cardiology recently and they placed him on a ZIO monitor  to evaluate for any cause of his bradycardia.  Anemia: Noted to be lower than his baseline in the hospital.  He was FOBT positive.  He notes no blood in his stool.  He is now off of Xarelto relating to this.   ROS: See pertinent positives and negatives per HPI.  Past Medical History:  Diagnosis Date  . (HFpEF) heart failure with preserved ejection fraction ([place])   . Coronary artery disease   . Hyperlipidemia   . Hypertension   . PAH (pulmonary artery hypertension) ([place])   . Permanent atrial fibrillation ([place])   . Pleural effusion     Past Surgical History:  Procedure Laterality Date  . CARDIAC CATHETERIZATION    . CHOLECYSTECTOMY    . CORONARY ANGIOPLASTY  06/2004   s/p stent placement @ [facility]  . CORONARY ARTERY BYPASS GRAFT  04-24-2004   CABG x 3 [facility]    Family History  Problem Relation Age of Onset  . Heart disease Father   . Heart disease Paternal Uncle   . Heart attack Brother     SOCIAL HX: Non-smoker   Current Outpatient Medications:  .  atorvastatin (LIPITOR) 80 MG tablet, TAKE 1 TABLET BY MOUTH EVERY DAY, Disp: 90 tablet, Rfl: 0 .  ferrous sulfate 325 (65 FE) MG tablet, Take 1 tablet (325 mg total) by mouth daily., Disp: 30 tablet, Rfl: 3 .  furosemide (LASIX) 40 MG tablet, Take 1 tablet (40 mg total) by mouth daily., Disp: 90 tablet, Rfl: 3 .  spironolactone (ALDACTONE) 25 MG tablet, Take 1 tablet (25 mg total) by mouth daily., Disp: 90  tablet, Rfl: 3 .  lisinopril (ZESTRIL) 40 MG tablet, Take 1 tablet (40 mg total) by mouth daily., Disp: 90 tablet, Rfl: 3  EXAM: This is a telehealth telephone visit and thus no physical exam was completed.  ASSESSMENT AND PLAN:  Discussed the following assessment and plan:  No problem-specific Assessment & Plan notes found for this encounter.   No orders of the defined types were placed in this encounter.   No orders of the defined types were placed in this encounter.    I discussed the assessment and treatment plan  with the patient. The patient was provided an opportunity to ask questions and all were answered. The patient agreed with the plan and demonstrated an understanding of the instructions.   The patient was advised to call back or seek an in-person evaluation if the symptoms worsen or if the condition fails to improve as anticipated.  I provided 8 minutes of non-face-to-face time during this encounter.   [name], MD

## 2020-04-15 NOTE — Assessment & Plan Note (Signed)
More anemic than typical.  He did have positive FOBT.  Will refer to GI for that.  Will obtain other lab work to work-up deficiency causes of his anemia.

## 2020-04-15 NOTE — Assessment & Plan Note (Addendum)
Now with bradycardia.  He will complete his zio monitor through cardiology.

## 2020-04-17 NOTE — Progress Notes (Signed)
High risk off the xarelto, Also in setting of pulmonary HTN Would consider restarting as workup proceeds, close monitoring of labs

## 2020-04-18 DIAGNOSIS — D631 Anemia in chronic kidney disease: Secondary | ICD-10-CM | POA: Diagnosis not present

## 2020-04-18 DIAGNOSIS — I4891 Unspecified atrial fibrillation: Secondary | ICD-10-CM | POA: Diagnosis not present

## 2020-04-18 DIAGNOSIS — E785 Hyperlipidemia, unspecified: Secondary | ICD-10-CM | POA: Diagnosis not present

## 2020-04-18 DIAGNOSIS — N1831 Chronic kidney disease, stage 3a: Secondary | ICD-10-CM | POA: Diagnosis not present

## 2020-04-18 DIAGNOSIS — E1151 Type 2 diabetes mellitus with diabetic peripheral angiopathy without gangrene: Secondary | ICD-10-CM | POA: Diagnosis not present

## 2020-04-18 DIAGNOSIS — E1122 Type 2 diabetes mellitus with diabetic chronic kidney disease: Secondary | ICD-10-CM | POA: Diagnosis not present

## 2020-04-18 DIAGNOSIS — I2510 Atherosclerotic heart disease of native coronary artery without angina pectoris: Secondary | ICD-10-CM | POA: Diagnosis not present

## 2020-04-18 DIAGNOSIS — I5032 Chronic diastolic (congestive) heart failure: Secondary | ICD-10-CM | POA: Diagnosis not present

## 2020-04-18 DIAGNOSIS — I130 Hypertensive heart and chronic kidney disease with heart failure and stage 1 through stage 4 chronic kidney disease, or unspecified chronic kidney disease: Secondary | ICD-10-CM | POA: Diagnosis not present

## 2020-04-18 DIAGNOSIS — I700 Atherosclerosis of aorta: Secondary | ICD-10-CM | POA: Diagnosis not present

## 2020-04-19 DIAGNOSIS — I10 Essential (primary) hypertension: Secondary | ICD-10-CM | POA: Diagnosis not present

## 2020-04-19 DIAGNOSIS — I4891 Unspecified atrial fibrillation: Secondary | ICD-10-CM | POA: Diagnosis not present

## 2020-04-19 DIAGNOSIS — Z8249 Family history of ischemic heart disease and other diseases of the circulatory system: Secondary | ICD-10-CM | POA: Diagnosis not present

## 2020-04-19 DIAGNOSIS — E785 Hyperlipidemia, unspecified: Secondary | ICD-10-CM | POA: Diagnosis not present

## 2020-04-19 DIAGNOSIS — I130 Hypertensive heart and chronic kidney disease with heart failure and stage 1 through stage 4 chronic kidney disease, or unspecified chronic kidney disease: Secondary | ICD-10-CM | POA: Diagnosis not present

## 2020-04-19 DIAGNOSIS — Z7409 Other reduced mobility: Secondary | ICD-10-CM | POA: Diagnosis not present

## 2020-04-19 DIAGNOSIS — D649 Anemia, unspecified: Secondary | ICD-10-CM | POA: Diagnosis not present

## 2020-04-19 DIAGNOSIS — D631 Anemia in chronic kidney disease: Secondary | ICD-10-CM | POA: Diagnosis not present

## 2020-04-19 DIAGNOSIS — I700 Atherosclerosis of aorta: Secondary | ICD-10-CM | POA: Diagnosis not present

## 2020-04-19 DIAGNOSIS — I5032 Chronic diastolic (congestive) heart failure: Secondary | ICD-10-CM | POA: Diagnosis not present

## 2020-04-19 DIAGNOSIS — N1831 Chronic kidney disease, stage 3a: Secondary | ICD-10-CM | POA: Diagnosis not present

## 2020-04-19 DIAGNOSIS — Z833 Family history of diabetes mellitus: Secondary | ICD-10-CM | POA: Diagnosis not present

## 2020-04-19 DIAGNOSIS — E1122 Type 2 diabetes mellitus with diabetic chronic kidney disease: Secondary | ICD-10-CM | POA: Diagnosis not present

## 2020-04-19 DIAGNOSIS — R600 Localized edema: Secondary | ICD-10-CM | POA: Diagnosis not present

## 2020-04-19 DIAGNOSIS — Z882 Allergy status to sulfonamides status: Secondary | ICD-10-CM | POA: Diagnosis not present

## 2020-04-19 DIAGNOSIS — I2510 Atherosclerotic heart disease of native coronary artery without angina pectoris: Secondary | ICD-10-CM | POA: Diagnosis not present

## 2020-04-19 DIAGNOSIS — E1151 Type 2 diabetes mellitus with diabetic peripheral angiopathy without gangrene: Secondary | ICD-10-CM | POA: Diagnosis not present

## 2020-04-20 ENCOUNTER — Other Ambulatory Visit: Payer: Self-pay | Admitting: *Deleted

## 2020-04-20 ENCOUNTER — Other Ambulatory Visit: Payer: Self-pay

## 2020-04-20 NOTE — Patient Outreach (Signed)
[facility]) Care Management  04/20/2020  Brayen Bunn 1941/06/22 [id number]   [facility] outreach to EMMI referred patient/complex care services  Mr MOSES ODOHERTY was referred to [facility] on 04/05/20 for MD referral for  Referral Reason:Heart failure would benefit from outpatient case management through [facility] and then on 04/11/20 for EMMI general red alert for not knowing who to call about changes in condition and issues with wound healing- Resolved EMMI on 04/13/20  Insurance: [place] access [facility] admissions x 1 ED visits x 1 in the last 6 months Last admission from [facility] 03/23/20 to 04/06/20 anasarca, bradycardia, 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  Patient is able to verify HIPAA ([place] and Accountability Act) identifiers, date of birth (DOB) and address Reviewed and addressed the purpose of the follow up call with the patient Today his brother was heard in the background briefly  Consent: [facility] ([place]) RN CM reviewed [facility] services with patient. Patient gave verbal consent for services.  [facility] Follow up congestive Heart Failure (CHF) Today Mr Bartell reports he is hurting in both his ankle/feet related to swelling  congestive Heart Failure (CHF) assessment indicates no shortness of breath (sob), no chest discomfort, no dizziness Positive for swelling of feet, ankles and wt gain  He reports he is taking his prescribed diuretic lasix 40 mg daily as ordered but with assessment is noted to continue to have an increased intake of sodium in packaged or tv dinner foods CHF action plan reviewed again and he states he is elevating his legs at  the time of the outreach  He continues to confirm he eats corn flakes for breakfast but takes in tv dinners and packaged meals on wheel meal the rest of the day. [facility] RN CM attempted to teach him about sodium intake using the last tv dinner package but as he read the package it was not indicating sodium intake as it was actually at meals on wheel package.  His brother was heard informing him that all his tv dinners had salt in them He discussed an intake of 4 glasses of fluids after the meal to include milk, water and juice. He discusses his memory issues and can not recall a fluid restriction value  He still has not obtained scales as previous encouraged but report a male home health staff weighed him in the last 2-3 days to discuss a weight of 188 lbs and he recall weigh 167-169 lbs when leaving the hospital   [facility] RN intervention CHF assessment and CHF action plan reviewed again [facility] RN CM attempted to teach him about sodium intake using the last tv dinner package  [facility] RN CM sent a message to [place] to request scales to be sent to Mr Zentner for home management of CHF [facility] RN CM encouraged a goal of having Mr Bhagat to daily have his routine cereal for breakfast  Falls  He denies falls since the last [facility] RN CM outreach on 04/15/20   [facility] services -transportation Reviewed with him the outreach from [facility] related to services to include transportation to medical appointments At this time he reports not receiving forms sent to him in the mail by  [place] SW  He was encouraged by [facility] RN CM to check his mail routinely and to outreach when these forms arrive   upcoming appointment [facility] RN CM discussed his upcoming appoint with cardiology on 04/29/20  He informs [facility] RN CM he had not remembered this Discussed the importance of him working with [facility] to get to appt on 04/29/20 or getting his neighbor "eddie" to help with transport  Plan [facility] RN CM will follow up with Mr Deloria within the next 5-7 business days  to assist or remind him of he upcoming appointment, follow up on scales and further care coordination/disease management Pt encouraged to return a call to [facility] RN CM prn Routed note to MD  Goals Addressed              This Visit's Progress     Patient Stated   .  Patient will be able to verbalize management of CHF at home as evidence by no readmission and decrease in worsening symptoms (pt-stated)   Not on track     [place] (see longtitudinal plan of care for additional care plan information)   Current Barriers:  Marland Kitchen Knowledge deficit related to basic heart failure pathophysiology and self care management . Cognitive Deficits . Hard of hearing  . Transportation to medical appointments  Case Manager Clinical Goal(s):  Marland Kitchen Over the next 90 days, patient will verbalize understanding of Heart Failure Action Plan and when to call doctor . Over the next 30 days, patient will take all Heart Failure mediations as prescribed . 04/20/20 still not understanding importance of low sodium diet to prevent swelling/pain related to CHF  Interventions:  . Basic overview and discussion of pathophysiology of Heart Failure reviewed  . Provided verbal education on low sodium diet . Discussed importance of daily weight and advised patient to weigh and record daily . Reviewed role of diuretics in prevention of fluid overload and management of heart failure . [facility] SW referral for transportation needs, [facility] SW evaluation of SDOH needs for [place] patient, possible Personal care services (PCS) aide services  . Coordinated/collaborated with [facility] CMA to have patient mailed scales . Review upcoming appointments . Encouraged checking mail for forms sent by [facility] SW and follow up with [facility] SW   Patient Self Care Activities:  . Takes Heart Failure Medications as prescribed . Verbalizes understanding of and follows CHF Action Plan . Adheres to low sodium diet . Attend all MD appointments .  04/20/20  agree to cereal in am, cook his own low sodium lunch or dinner, one meals on wheels/prepackaged meal daily     Initial goal documentation         Annalaya Wile L. Lavina Hamman, RN, BSN, [place] Coordinator Office number [phone] Mobile number [phone]  Main [facility] number [phone] Fax number [phone]

## 2020-04-21 DIAGNOSIS — I4891 Unspecified atrial fibrillation: Secondary | ICD-10-CM | POA: Diagnosis not present

## 2020-04-21 DIAGNOSIS — N1831 Chronic kidney disease, stage 3a: Secondary | ICD-10-CM | POA: Diagnosis not present

## 2020-04-21 DIAGNOSIS — I130 Hypertensive heart and chronic kidney disease with heart failure and stage 1 through stage 4 chronic kidney disease, or unspecified chronic kidney disease: Secondary | ICD-10-CM | POA: Diagnosis not present

## 2020-04-21 DIAGNOSIS — E785 Hyperlipidemia, unspecified: Secondary | ICD-10-CM | POA: Diagnosis not present

## 2020-04-21 DIAGNOSIS — D631 Anemia in chronic kidney disease: Secondary | ICD-10-CM | POA: Diagnosis not present

## 2020-04-21 DIAGNOSIS — I5032 Chronic diastolic (congestive) heart failure: Secondary | ICD-10-CM | POA: Diagnosis not present

## 2020-04-21 DIAGNOSIS — E1151 Type 2 diabetes mellitus with diabetic peripheral angiopathy without gangrene: Secondary | ICD-10-CM | POA: Diagnosis not present

## 2020-04-21 DIAGNOSIS — I700 Atherosclerosis of aorta: Secondary | ICD-10-CM | POA: Diagnosis not present

## 2020-04-21 DIAGNOSIS — I2510 Atherosclerotic heart disease of native coronary artery without angina pectoris: Secondary | ICD-10-CM | POA: Diagnosis not present

## 2020-04-21 DIAGNOSIS — E1122 Type 2 diabetes mellitus with diabetic chronic kidney disease: Secondary | ICD-10-CM | POA: Diagnosis not present

## 2020-04-21 NOTE — Patient Outreach (Signed)
[name]) Care Management  04/21/2020  Jordan Perkins 03-Jun-1941 [id number]   [place] coordination- attempt to collaborate with [facility] staff  Outreach attempt to wellcare [phone] No answer. [facility] RN CM left HIPAA [facility] Portability and Accountability Act) compliant voicemail message along with CM's contact info.   Plan:  [facility] RN CM will collaborate with well care staff upon a possible return call related clarification of reported increase in wt by [name] on 04/20/20  Joelene Millin L. Lavina Hamman, RN, BSN, [place] Coordinator Office number [phone] Mobile number [phone]  Main [facility] number [phone] Fax number [phone]

## 2020-04-25 DIAGNOSIS — N1831 Chronic kidney disease, stage 3a: Secondary | ICD-10-CM | POA: Diagnosis not present

## 2020-04-25 DIAGNOSIS — I4891 Unspecified atrial fibrillation: Secondary | ICD-10-CM | POA: Diagnosis not present

## 2020-04-25 DIAGNOSIS — I130 Hypertensive heart and chronic kidney disease with heart failure and stage 1 through stage 4 chronic kidney disease, or unspecified chronic kidney disease: Secondary | ICD-10-CM | POA: Diagnosis not present

## 2020-04-25 DIAGNOSIS — I700 Atherosclerosis of aorta: Secondary | ICD-10-CM | POA: Diagnosis not present

## 2020-04-25 DIAGNOSIS — D631 Anemia in chronic kidney disease: Secondary | ICD-10-CM | POA: Diagnosis not present

## 2020-04-25 DIAGNOSIS — I5032 Chronic diastolic (congestive) heart failure: Secondary | ICD-10-CM | POA: Diagnosis not present

## 2020-04-25 DIAGNOSIS — E785 Hyperlipidemia, unspecified: Secondary | ICD-10-CM | POA: Diagnosis not present

## 2020-04-25 DIAGNOSIS — E1151 Type 2 diabetes mellitus with diabetic peripheral angiopathy without gangrene: Secondary | ICD-10-CM | POA: Diagnosis not present

## 2020-04-25 DIAGNOSIS — E1122 Type 2 diabetes mellitus with diabetic chronic kidney disease: Secondary | ICD-10-CM | POA: Diagnosis not present

## 2020-04-25 DIAGNOSIS — I2510 Atherosclerotic heart disease of native coronary artery without angina pectoris: Secondary | ICD-10-CM | POA: Diagnosis not present

## 2020-04-26 ENCOUNTER — Other Ambulatory Visit: Payer: Self-pay | Admitting: *Deleted

## 2020-04-26 ENCOUNTER — Other Ambulatory Visit: Payer: Self-pay

## 2020-04-26 NOTE — Patient Outreach (Signed)
[facility]) Care Management  04/26/2020  Jachob Mcclean May 08, 1941 [id number]   [facility] outreach to EMMI referred patient/complex care services  Mr ESTILL LLERENA was referred to [facility] on 04/05/20 for MD referral for  Referral Reason:Heart failure would benefit from outpatient case management through [facility] and then on 04/11/20 for EMMI general red alert for not knowing who to call about changes in condition and issues with wound healing- Resolved EMMI on 04/13/20  Insurance:[employer] medicare and medicaid [place] access [facility] admissions x1ED visits x 1in the last 6 months Last admission from [facility] 03/23/20 to 04/06/20 anasarca, bradycardia, 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  Patient is able to verify HIPAA ([place] and Accountability Act) identifiers, date of birth (DOB) and address Reviewed and addressed the purpose of the follow up call with the patient Today his brother was heard in the background briefly  Consent: [facility] RN CM reviewed [facility] services with patient. Patient gave verbal consent for services.  [facility] Follow up Transportation to upcoming medical cardiology appointment on 04/29/20 Mr Radu reports he has spoken with Mr Duanne Limerick about taking him to his appointment today Mr Sachs is able to confirm his appointment is at 0930 04/29/20 and that he should try to get there at [place] He gave [facility] RN CM permission to verify this with Mr Duanne Limerick  [facility] RN CM encouraged him to check his mail to get the forms sent to him from [facility] for medicaid transportation He states he has not received them yet but would check the mail today [facility] RN CM again discussed  with him the importance of the forms being received and completed to assist with setting up his  Medicaid transportation services He voiced understanding  [facility] RN CM called and spoke with Mr Duanne Limerick who confirms he will be assisting Mr Petrosian to get to his appointment. Mr Duanne Limerick reports he is at Mr Shanks's home, had come to visit him [facility] RN CM reviewed the date and time of the appointment 04/29/20 0930 Cardiology  [facility] RN CM updated Mr Duanne Limerick that Mr Doris needs to get the medicaid transportation forms sent via mail and complete them as [facility] SW scheduled to outreach He voiced understanding  [facility] RN CM sent an in basket message to [facility] to  Updated her that Mr Pilar has transportation to 04/29/20 appointment  congestive Heart Failure (CHF) Mr Hamberger continues to report some pain in his feet He states the swelling is gone. He reports the home health staff checked him on "yesterday. I told her about them"  [facility] RN CM encourage him to continue the CHF action plan to include medicines, diet, fluids, elevation and reporting to MD especially during the 04/29/20 appointment He voiced understanding  No falls   Plan [facility] RN CM will follow up with Mr Lesinski within the next 7-10 business days to  follow up on scales and further care coordination/disease management Pt encouraged to return a call to [facility] RN CM prn Routed note to MD Goals Addressed              This Visit's Progress     Patient Stated   .  Patient will be able to verbalize management  of CHF at home as evidence by no readmission and decrease in worsening symptoms (pt-stated)   On track     [place] (see longtitudinal plan of care for additional care plan information)   Current Barriers:  Marland Kitchen Knowledge deficit related to basic heart failure pathophysiology and self care management . Cognitive Deficits . Hard of hearing  . Transportation to medical appointments  Case Manager Clinical Goal(s):  Marland Kitchen Over the next 90 days, patient will  verbalize understanding of Heart Failure Action Plan and when to call doctor . Over the next 30 days, patient will take all Heart Failure mediations as prescribed . 04/20/20 still not understanding importance of low sodium diet to prevent swelling/pain related to CHF  Interventions:  . Basic overview and discussion of pathophysiology of Heart Failure reviewed  . Provided verbal education on low sodium diet . Discussed importance of daily weight and advised patient to weigh and record daily . Reviewed role of diuretics in prevention of fluid overload and management of heart failure . [facility] SW referral for transportation needs, [facility] SW evaluation of SDOH needs for [place] patient, possible Personal care services (PCS) aide services  . Coordinated/collaborated with [facility] CMA to have patient mailed scales . Review upcoming appointments . Encouraged checking mail for forms sent by [facility] SW and follow up with [place] with [place] to landlord/friend to confirm assist with transportation to medical appointment  Patient Self Care Activities:  . Takes Heart Failure Medications as prescribed . Verbalizes understanding of and follows CHF Action Plan . Adheres to low sodium diet . Attend all MD appointments .  04/20/20 agree to cereal in am, cook his own low sodium lunch or dinner, one meals on wheels/prepackaged meal daily  . Inform home health staff of worsening symptoms     Initial goal documentation        Alberta C[place]ns L. Lavina Hamman, RN, BSN, [place] Coordinator Office number [phone] Mobile number [phone]  Main [facility] number [phone] Fax number [phone]

## 2020-04-27 ENCOUNTER — Other Ambulatory Visit: Payer: Self-pay | Admitting: *Deleted

## 2020-04-27 DIAGNOSIS — I2510 Atherosclerotic heart disease of native coronary artery without angina pectoris: Secondary | ICD-10-CM | POA: Diagnosis not present

## 2020-04-27 DIAGNOSIS — E1122 Type 2 diabetes mellitus with diabetic chronic kidney disease: Secondary | ICD-10-CM | POA: Diagnosis not present

## 2020-04-27 DIAGNOSIS — I130 Hypertensive heart and chronic kidney disease with heart failure and stage 1 through stage 4 chronic kidney disease, or unspecified chronic kidney disease: Secondary | ICD-10-CM | POA: Diagnosis not present

## 2020-04-27 DIAGNOSIS — I700 Atherosclerosis of aorta: Secondary | ICD-10-CM | POA: Diagnosis not present

## 2020-04-27 DIAGNOSIS — I5032 Chronic diastolic (congestive) heart failure: Secondary | ICD-10-CM | POA: Diagnosis not present

## 2020-04-27 DIAGNOSIS — E1151 Type 2 diabetes mellitus with diabetic peripheral angiopathy without gangrene: Secondary | ICD-10-CM | POA: Diagnosis not present

## 2020-04-27 DIAGNOSIS — D631 Anemia in chronic kidney disease: Secondary | ICD-10-CM | POA: Diagnosis not present

## 2020-04-27 DIAGNOSIS — N1831 Chronic kidney disease, stage 3a: Secondary | ICD-10-CM | POA: Diagnosis not present

## 2020-04-27 DIAGNOSIS — E785 Hyperlipidemia, unspecified: Secondary | ICD-10-CM | POA: Diagnosis not present

## 2020-04-27 DIAGNOSIS — I4891 Unspecified atrial fibrillation: Secondary | ICD-10-CM | POA: Diagnosis not present

## 2020-04-27 NOTE — Patient Outreach (Signed)
[facility]) Care Management  04/27/2020  Jordan Perkins 1940-09-26 [id number] CSW spoke with pt today by phone who reports he is going to see his heart MD on Friday.  "My neighbor is taking me".  Pt denies any other upcoming appointment/transportation needs and is reminded to reach out if that changes.  Pt complaining about a sore leg.  Pt reports he is independent with  ADL's , gets meals on wheels and states, "I cook a little bit".  Pt denies any depression or additional concerns or needs at this time.  He states his brother who lives with him has just left the house (brother drives a moped).  CSW will plan to outreach again in 1-2 weeks.   Eduard Clos, MSW, [place] Worker  [place] [phone]

## 2020-04-29 ENCOUNTER — Ambulatory Visit (INDEPENDENT_AMBULATORY_CARE_PROVIDER_SITE_OTHER): Payer: Medicare HMO | Admitting: Family

## 2020-04-29 ENCOUNTER — Other Ambulatory Visit: Payer: Self-pay

## 2020-04-29 ENCOUNTER — Encounter: Payer: Self-pay | Admitting: Family

## 2020-04-29 VITALS — BP 130/56 | HR 61 | Ht 71.0 in | Wt 178.0 lb

## 2020-04-29 DIAGNOSIS — I4891 Unspecified atrial fibrillation: Secondary | ICD-10-CM | POA: Diagnosis not present

## 2020-04-29 DIAGNOSIS — I2510 Atherosclerotic heart disease of native coronary artery without angina pectoris: Secondary | ICD-10-CM | POA: Diagnosis not present

## 2020-04-29 DIAGNOSIS — Z7901 Long term (current) use of anticoagulants: Secondary | ICD-10-CM

## 2020-04-29 DIAGNOSIS — N1831 Chronic kidney disease, stage 3a: Secondary | ICD-10-CM | POA: Diagnosis not present

## 2020-04-29 DIAGNOSIS — I25118 Atherosclerotic heart disease of native coronary artery with other forms of angina pectoris: Secondary | ICD-10-CM | POA: Diagnosis not present

## 2020-04-29 DIAGNOSIS — I130 Hypertensive heart and chronic kidney disease with heart failure and stage 1 through stage 4 chronic kidney disease, or unspecified chronic kidney disease: Secondary | ICD-10-CM | POA: Diagnosis not present

## 2020-04-29 DIAGNOSIS — D631 Anemia in chronic kidney disease: Secondary | ICD-10-CM | POA: Diagnosis not present

## 2020-04-29 DIAGNOSIS — E785 Hyperlipidemia, unspecified: Secondary | ICD-10-CM | POA: Diagnosis not present

## 2020-04-29 DIAGNOSIS — E1151 Type 2 diabetes mellitus with diabetic peripheral angiopathy without gangrene: Secondary | ICD-10-CM | POA: Diagnosis not present

## 2020-04-29 DIAGNOSIS — D649 Anemia, unspecified: Secondary | ICD-10-CM

## 2020-04-29 DIAGNOSIS — I700 Atherosclerosis of aorta: Secondary | ICD-10-CM | POA: Diagnosis not present

## 2020-04-29 DIAGNOSIS — I4821 Permanent atrial fibrillation: Secondary | ICD-10-CM

## 2020-04-29 DIAGNOSIS — I5032 Chronic diastolic (congestive) heart failure: Secondary | ICD-10-CM | POA: Diagnosis not present

## 2020-04-29 DIAGNOSIS — E1122 Type 2 diabetes mellitus with diabetic chronic kidney disease: Secondary | ICD-10-CM | POA: Diagnosis not present

## 2020-04-29 NOTE — Patient Instructions (Addendum)
Medication Instructions:  Your physician has recommended you make the following change in your medication:   CHANGE Furosemide (Lasix) to 80mg  (2 tablets) in the morning for 4 days SO WHEN YOU GET HOME TODAY Take 2 tablets of your Furosemide (Lasix)   On Tuesday go back to taking Lasix 1 tablet in the morning.   *If you need a refill on your cardiac medications before your next appointment, please call your pharmacy*  Lab Work: Your physician has recommended you make the following change in your medication: BMP, CBC  If you have labs (blood work) drawn today and your tests are completely normal, you will receive your results only by: Marland Kitchen MyChart Message (if you have MyChart) OR . A paper copy in the mail If you have any lab test that is abnormal or we need to change your treatment, we will call you to review the results.   Testing/Procedures: Your EKG today shows atrial fibrillation which is stable  Follow-Up: At [facility], you and your health needs are our priority.  As part of our continuing mission to provide you with exceptional heart care, we have created designated Provider Care Teams.  These Care Teams include your primary Cardiologist (physician) and Advanced Practice Providers (APPs -  Physician Assistants and Nurse Practitioners) who all work together to provide you with the care you need, when you need it.  We recommend signing up for the patient portal called "MyChart".  Sign up information is provided on this After Visit Summary.  MyChart is used to connect with patients for Virtual Visits (Telemedicine).  Patients are able to view lab/test results, encounter notes, upcoming appointments, etc.  Non-urgent messages can be sent to your provider as well.   To learn more about what you can do with MyChart, go to [email or web address].    Your next appointment:    05/19/20 with [facility], NP) as previously scheduled  AND  In 6 weeks with Dr.  Rockey Situ or APP  Other Instructions Recommend not drinking Gatorade as it has salt which can make more swelling.  Please weigh yourself every morning. If you gain 2 pounds overnight or 5 pounds in one week, please call our office. This is a sign you have too much fluid on board.  Continue low salt diet.  Drink less than 2 liters of fluid per day.  Laurann [place], NP will call you Monday to check on your weight and swelling.

## 2020-04-29 NOTE — Progress Notes (Signed)
Office Visit    Patient Name: Jordan Perkins Date of Encounter: 04/29/2020  Primary Care Provider:  [name], MD Primary Cardiologist:  Ida Rogue, MD Electrophysiologist:  None   Chief Complaint    Jordan Perkins is a 79 y.o. male with a hx of HTN, HLD, permanent atrial fibrillation, pulmonary hypertension, CAD s/p CABG, diastolic congestive heart failure, anemia, CKD 3 presents today for HF follow up.   Past Medical History    Past Medical History:  Diagnosis Date  . (HFpEF) heart failure with preserved ejection fraction ([place])   . Coronary artery disease   . Hyperlipidemia   . Hypertension   . PAH (pulmonary artery hypertension) ([place])   . Permanent atrial fibrillation ([place])   . Pleural effusion    Past Surgical History:  Procedure Laterality Date  . CARDIAC CATHETERIZATION    . CHOLECYSTECTOMY    . CORONARY ANGIOPLASTY  06/2004   s/p stent placement @ [facility]  . CORONARY ARTERY BYPASS GRAFT  04-24-2004   CABG x 3 [facility]    Allergies  Allergies  Allergen Reactions  . Sulfa Antibiotics Rash    Rash/hives   . Other Rash    History of Present Illness    Jordan Perkins is a 79 y.o. male with a hx of HTN, HLD, permanent atrial fibrillation, pulmonary hypertension, CAD s/p CABG, diastolic congestive heart failure, anemia, CKD 3 last seen 04/14/20.  He was admitted 7/14 through 04/06/2020 [facility] due to anasarca, acute on chronic diastolic heart failure, bradycardia.  His bradycardia was asymptomatic.  His metoprolol was discontinued.  His hydrochlorothiazide was discontinued.  His Xarelto was stopped due to FOBT and falls.  He had recurrent falls with generalized weakness.  He was discharged on Lasix 40 mg daily, lisinopril 40 mg daily, spironolactone 25 mg daily.  His weight on date of discharge was 75.8 kg.  Labs 04/04/2020 with hemoglobin 7.6, creatinine 1.06, GFR greater than 60.  10 pound weight gain since follow-up 2 weeks ago.  Endorses dyspnea  on exertion and increasing lower extremity edema.  His meals are Meals on Wheels or a low-sodium.  Endorses drinking less than 2 L of fluid per day.  He is carefully followed by [facility] care management.  He had just received the scale they sent him in the mail. He lives with his brother. He does not drive and their landlord/friend has been helping him get to and from multiple appointments.  At last clinic visit his hemoglobin was up to 8.4 from 7.6 at hospital discharge.  Reports no melena no hematuria.   Reports headache this morning, took Tylenol with some relief.  Endorses some orthopnea but no PND. Reds Vest reading of 33% in clinic today. EKGs/Labs/Other Studies Reviewed:   The following studies were reviewed today:  Echo 03/23/2020 1. Left ventricular ejection fraction, by estimation, is 60 to 65%. The  left ventricle has normal function. The left ventricle demonstrates global  hypokinesis. Left ventricular diastolic parameters are indeterminate.   2. Right ventricular systolic function is mildly reduced. The right  ventricular size is mildly enlarged. There is severely elevated pulmonary  artery systolic pressure. The estimated right ventricular systolic  pressure is 409.8 mmHg.   3. Left atrial size was mildly dilated.   4. Right atrial size was mildly dilated.   5. The mitral valve is grossly normal. Mild mitral valve regurgitation.   6. The aortic valve is tricuspid. Aortic valve regurgitation is trivial.  Mild aortic  valve sclerosis is present, with no evidence of aortic valve  stenosis.   7. The inferior vena cava is dilated in size with <50% respiratory  variability, suggesting right atrial pressure of 15 mmHg.   EKG:  EKG is ordered today.  The ekg ordered today demonstrates atrial fibrillation rate 61 bpm with ocassional PVC and no acute ST/T wave changes.  Recent Labs: 04/03/2020: Magnesium 2.1 04/14/2020: ALT 15; BNP 283.3; BUN 25; Creatinine, Ser 1.04; Hemoglobin 8.4;  Platelets 231; Potassium 4.2; Sodium 136  Recent Lipid Panel    Component Value Date/Time   CHOL 75 03/24/2020 0434   CHOL 89 (L) 04/06/2015 1642   CHOL 150 06/06/2013 0947   TRIG 42 03/24/2020 0434   TRIG 85 06/06/2013 0947   HDL 21 (L) 03/24/2020 0434   HDL 40 04/06/2015 1642   HDL 43 06/06/2013 0947   CHOLHDL 3.6 03/24/2020 0434   VLDL 8 03/24/2020 0434   VLDL 17 06/06/2013 0947   LDLCALC 46 03/24/2020 0434   LDLCALC 36 04/06/2015 1642   LDLCALC 90 06/06/2013 0947    Home Medications   Current Meds  Medication Sig  . atorvastatin (LIPITOR) 80 MG tablet TAKE 1 TABLET BY MOUTH EVERY DAY  . ferrous sulfate 325 (65 FE) MG tablet Take 1 tablet (325 mg total) by mouth daily.  . furosemide (LASIX) 40 MG tablet Take 1 tablet (40 mg total) by mouth daily.  Marland Kitchen lisinopril (ZESTRIL) 40 MG tablet Take 1 tablet (40 mg total) by mouth daily.  Marland Kitchen spironolactone (ALDACTONE) 25 MG tablet Take 1 tablet (25 mg total) by mouth daily.    Review of Systems   Review of Systems  Constitutional: Negative for chills, fever and malaise/fatigue.       (+) headache  Cardiovascular: Positive for chest pain, dyspnea on exertion and leg swelling. Negative for irregular heartbeat, near-syncope, orthopnea, palpitations and syncope.  Respiratory: Negative for cough, shortness of breath and wheezing.   Gastrointestinal: Negative for melena, nausea and vomiting.  Genitourinary: Negative for hematuria.  Neurological: Negative for dizziness, light-headedness and weakness.   All other systems reviewed and are otherwise negative except as noted above.  Physical Exam    VS:  BP (!) 130/56 (BP Location: Left Arm, Patient Position: Sitting, Cuff Size: Normal)   Pulse 61   Ht 5\' 11"  (1.803 m)   Wt 178 lb (80.7 kg)   SpO2 97%   BMI 24.83 kg/m  , BMI Body mass index is 24.83 kg/m. GEN: Thin, well developed, in no acute distress. HEENT: normal. Neck: Supple, no JVD, carotid bruits, or masses. Cardiac:  irregularly irregular, no murmurs, rubs, or gallops. No clubbing, cyanosis. 2+ bilateral LE edema.  Radials/DP/PT 2+ and equal bilaterally.  Respiratory:  Respirations regular and unlabored, clear to auscultation bilaterally. GI: Soft, nontender, nondistended MS: No deformity or atrophy. Skin: Warm and dry, no rash. Neuro:  Strength and sensation are intact. Psych: Normal affect.  Assessment & Plan    1. Permanent atrial fibrillation, bradycardia - Just completed wearing 14 day ZIO to assess bradycardia in setting of permanent atrial fib off rate controlling agents. En route back to ZIO. EKG today shows rate controlled atrial fibrillation.  Not presently on any rate limiting agents.  2. Chronic anticoagulation/FOBT/anemia - Hb on discharge 7.6 and a follow-up of is 8.4.  Repeat CBC today. Marland Kitchen He had positive FOBT in the hospital and his Xarelto was discontinued. He reports no hematuria nor melena.  He has high risk off of  anticoagulation with CHADS2VASc score of 5.  Will defer reinitiation of Xarelto until he is seen by GI.  He does have a referral.  3. HFpEF - Does endorse keeping his lower extremities elevated. His Meals on Wheels are low-sodium. He drinks less than 2 L of fluid per day.  He is up 9 pounds from clinic visit 2 weeks ago.  Increase Lasix to 80 mg for 4 days then return to 40 mg daily.  Continue Aldactone 25 mg daily. Marland Kitchen BMP today. NYHA II-III.   4. CAD s/p CABG - No negation for ischemic evaluation. No beta-blocker due to bradycardia. Continue present statin.  5. HTN - BP well controlled. Continue current antihypertensive regimen.   Disposition: Follow up in 3 week(s) with [facility] HF clinic as previously scheduled and in 6 weeks with Dr. Rockey Situ or APP.  Loel Dubonnet, NP 04/29/2020, 10:06 AM

## 2020-04-30 ENCOUNTER — Inpatient Hospital Stay
Admission: EM | Admit: 2020-04-30 | Discharge: 2020-05-05 | DRG: 640 | Disposition: A | Discharge status: Home / Self Care | Payer: Medicare HMO | Attending: Internal Medicine | Admitting: Internal Medicine

## 2020-04-30 ENCOUNTER — Telehealth: Payer: Self-pay | Admitting: Family

## 2020-04-30 ENCOUNTER — Emergency Department: Payer: Medicare HMO

## 2020-04-30 ENCOUNTER — Other Ambulatory Visit: Payer: Self-pay

## 2020-04-30 ENCOUNTER — Telehealth: Payer: Self-pay | Admitting: Cardiology

## 2020-04-30 DIAGNOSIS — I739 Peripheral vascular disease, unspecified: Secondary | ICD-10-CM | POA: Diagnosis present

## 2020-04-30 DIAGNOSIS — I2510 Atherosclerotic heart disease of native coronary artery without angina pectoris: Secondary | ICD-10-CM | POA: Diagnosis not present

## 2020-04-30 DIAGNOSIS — E871 Hypo-osmolality and hyponatremia: Secondary | ICD-10-CM | POA: Diagnosis not present

## 2020-04-30 DIAGNOSIS — Z955 Presence of coronary angioplasty implant and graft: Secondary | ICD-10-CM

## 2020-04-30 DIAGNOSIS — E43 Unspecified severe protein-calorie malnutrition: Secondary | ICD-10-CM | POA: Diagnosis not present

## 2020-04-30 DIAGNOSIS — I130 Hypertensive heart and chronic kidney disease with heart failure and stage 1 through stage 4 chronic kidney disease, or unspecified chronic kidney disease: Secondary | ICD-10-CM | POA: Diagnosis present

## 2020-04-30 DIAGNOSIS — Z882 Allergy status to sulfonamides status: Secondary | ICD-10-CM | POA: Diagnosis not present

## 2020-04-30 DIAGNOSIS — R600 Localized edema: Secondary | ICD-10-CM | POA: Diagnosis not present

## 2020-04-30 DIAGNOSIS — I4821 Permanent atrial fibrillation: Secondary | ICD-10-CM | POA: Diagnosis present

## 2020-04-30 DIAGNOSIS — Z951 Presence of aortocoronary bypass graft: Secondary | ICD-10-CM | POA: Diagnosis not present

## 2020-04-30 DIAGNOSIS — R531 Weakness: Secondary | ICD-10-CM

## 2020-04-30 DIAGNOSIS — I2721 Secondary pulmonary arterial hypertension: Secondary | ICD-10-CM | POA: Diagnosis present

## 2020-04-30 DIAGNOSIS — I10 Essential (primary) hypertension: Secondary | ICD-10-CM | POA: Diagnosis not present

## 2020-04-30 DIAGNOSIS — D631 Anemia in chronic kidney disease: Secondary | ICD-10-CM | POA: Diagnosis present

## 2020-04-30 DIAGNOSIS — E785 Hyperlipidemia, unspecified: Secondary | ICD-10-CM | POA: Diagnosis not present

## 2020-04-30 DIAGNOSIS — Z6821 Body mass index (BMI) 21.0-21.9, adult: Secondary | ICD-10-CM | POA: Diagnosis not present

## 2020-04-30 DIAGNOSIS — Z515 Encounter for palliative care: Secondary | ICD-10-CM

## 2020-04-30 DIAGNOSIS — N1831 Chronic kidney disease, stage 3a: Secondary | ICD-10-CM | POA: Diagnosis not present

## 2020-04-30 DIAGNOSIS — Z66 Do not resuscitate: Secondary | ICD-10-CM | POA: Diagnosis not present

## 2020-04-30 DIAGNOSIS — R609 Edema, unspecified: Secondary | ICD-10-CM | POA: Diagnosis not present

## 2020-04-30 DIAGNOSIS — Z7189 Other specified counseling: Secondary | ICD-10-CM

## 2020-04-30 DIAGNOSIS — K5900 Constipation, unspecified: Secondary | ICD-10-CM | POA: Diagnosis not present

## 2020-04-30 DIAGNOSIS — Z20822 Contact with and (suspected) exposure to covid-19: Secondary | ICD-10-CM | POA: Diagnosis present

## 2020-04-30 DIAGNOSIS — Z79899 Other long term (current) drug therapy: Secondary | ICD-10-CM

## 2020-04-30 DIAGNOSIS — R140 Abdominal distension (gaseous): Secondary | ICD-10-CM | POA: Diagnosis not present

## 2020-04-30 DIAGNOSIS — R5381 Other malaise: Secondary | ICD-10-CM | POA: Diagnosis present

## 2020-04-30 DIAGNOSIS — I4891 Unspecified atrial fibrillation: Secondary | ICD-10-CM | POA: Diagnosis present

## 2020-04-30 DIAGNOSIS — J90 Pleural effusion, not elsewhere classified: Secondary | ICD-10-CM | POA: Diagnosis not present

## 2020-04-30 DIAGNOSIS — Z9181 History of falling: Secondary | ICD-10-CM | POA: Diagnosis not present

## 2020-04-30 DIAGNOSIS — R069 Unspecified abnormalities of breathing: Secondary | ICD-10-CM | POA: Diagnosis not present

## 2020-04-30 DIAGNOSIS — G319 Degenerative disease of nervous system, unspecified: Secondary | ICD-10-CM | POA: Diagnosis not present

## 2020-04-30 DIAGNOSIS — Z8249 Family history of ischemic heart disease and other diseases of the circulatory system: Secondary | ICD-10-CM | POA: Diagnosis not present

## 2020-04-30 DIAGNOSIS — R0602 Shortness of breath: Secondary | ICD-10-CM | POA: Diagnosis not present

## 2020-04-30 DIAGNOSIS — I480 Paroxysmal atrial fibrillation: Secondary | ICD-10-CM | POA: Diagnosis not present

## 2020-04-30 DIAGNOSIS — I5032 Chronic diastolic (congestive) heart failure: Secondary | ICD-10-CM | POA: Diagnosis present

## 2020-04-30 DIAGNOSIS — J9811 Atelectasis: Secondary | ICD-10-CM | POA: Diagnosis not present

## 2020-04-30 DIAGNOSIS — E870 Hyperosmolality and hypernatremia: Secondary | ICD-10-CM | POA: Diagnosis not present

## 2020-04-30 DIAGNOSIS — R296 Repeated falls: Secondary | ICD-10-CM | POA: Diagnosis not present

## 2020-04-30 LAB — BASIC METABOLIC PANEL
Anion gap: 10 (ref 5–15)
BUN/Creatinine Ratio: 16 (ref 10–24)
BUN: 17 mg/dL (ref 8–27)
BUN: 27 mg/dL — ABNORMAL HIGH (ref 8–23)
CO2: 23 mmol/L (ref 20–29)
CO2: 22 mmol/L (ref 22–32)
Calcium: 9 mg/dL (ref 8.6–10.2)
Calcium: 8.5 mg/dL — ABNORMAL LOW (ref 8.9–10.3)
Chloride: 85 mmol/L — ABNORMAL LOW (ref 96–106)
Chloride: 81 mmol/L — ABNORMAL LOW (ref 98–111)
Creatinine, Ser: 1.04 mg/dL (ref 0.76–1.27)
Creatinine, Ser: 1.3 mg/dL — ABNORMAL HIGH (ref 0.61–1.24)
GFR calc Af Amer: 80 mL/min/{1.73_m2} (ref >59)
GFR calc Af Amer: >60 mL/min (ref >60)
GFR calc non Af Amer: 69 mL/min/{1.73_m2} (ref >59)
GFR calc non Af Amer: 53 mL/min — ABNORMAL LOW (ref >60)
Glucose, Bld: 111 mg/dL — ABNORMAL HIGH (ref 70–99)
Glucose: 82 mg/dL (ref 65–99)
Potassium: 5.1 mmol/L (ref 3.5–5.2)
Potassium: 4.9 mmol/L (ref 3.5–5.1)
Sodium: 119 mmol/L — CL (ref 134–144)
Sodium: 113 mmol/L — CL (ref 135–145)

## 2020-04-30 LAB — CBC
HCT: 25.7 % — ABNORMAL LOW (ref 39.0–52.0)
Hematocrit: 25.9 % — ABNORMAL LOW (ref 37.5–51.0)
Hemoglobin: 9.1 g/dL — ABNORMAL LOW (ref 13.0–17.7)
Hemoglobin: 9 g/dL — ABNORMAL LOW (ref 13.0–17.0)
MCH: 34.1 pg — ABNORMAL HIGH (ref 26.6–33.0)
MCH: 34.1 pg — ABNORMAL HIGH (ref 26.0–34.0)
MCHC: 35.1 g/dL (ref 31.5–35.7)
MCHC: 35 g/dL (ref 30.0–36.0)
MCV: 97 fL (ref 79–97)
MCV: 97.3 fL (ref 80.0–100.0)
Platelets: 210 10*3/uL (ref 150–450)
Platelets: 187 10*3/uL (ref 150–400)
RBC: 2.67 x10E6/uL — CL (ref 4.14–5.80)
RBC: 2.64 MIL/uL — ABNORMAL LOW (ref 4.22–5.81)
RDW: 13.7 % (ref 11.6–15.4)
RDW: 13.7 % (ref 11.5–15.5)
WBC: 6.8 10*3/uL (ref 3.4–10.8)
WBC: 7.8 10*3/uL (ref 4.0–10.5)
nRBC: 0 % (ref 0.0–0.2)

## 2020-04-30 LAB — TROPONIN I (HIGH SENSITIVITY)
Troponin I (High Sensitivity): 8 ng/L (ref <18)
Troponin I (High Sensitivity): 11 ng/L (ref <18)

## 2020-04-30 LAB — BRAIN NATRIURETIC PEPTIDE: B Natriuretic Peptide: 283.6 pg/mL — ABNORMAL HIGH (ref 0.0–100.0)

## 2020-04-30 MED ORDER — SODIUM CHLORIDE 3 % IV SOLN
INTRAVENOUS | Status: AC
  Filled 2020-04-30: qty 500

## 2020-04-30 MED ORDER — ENOXAPARIN SODIUM 40 MG/0.4ML ~~LOC~~ SOLN
40.0000 mg | SUBCUTANEOUS | Status: DC
  Administered 2020-05-01 – 2020-05-05 (×5): 40 mg via SUBCUTANEOUS
  Filled 2020-04-30 (×5): qty 0.4

## 2020-04-30 MED ORDER — SODIUM CHLORIDE 3 % IV SOLN
INTRAVENOUS | Status: DC
  Filled 2020-04-30: qty 500

## 2020-04-30 NOTE — ED Notes (Signed)
Pt had lab work drawn this past Thursday and PCP called pt and referred him to the ED for low sodium.

## 2020-04-30 NOTE — ED Triage Notes (Cosign Needed)
Emergency Medicine Provider Triage Evaluation Note  Jordan Perkins , a 79 y.o. male  was evaluated in triage.  Pt complains of weakness, headache, and bilateral lower extremity edema.  Symptoms have been present for the past "several weeks.".  Review of Systems  Positive: Headache, lower extremity swelling, generalized weakness Negative: Shortness of breath, nausea, vomiting, fever  Physical Exam  BP (!) 152/58   Pulse 66   Temp 98.5 F (36.9 C) (Oral)   Resp 20   Ht 5\' 11"  (1.803 m)   Wt 80.7 kg   SpO2 98%   BMI 24.83 kg/m  Gen:   Awake, no distress   HEENT:  Atraumatic  Resp:  Normal effort  Cardiac:  Normal rate  Abd:   Nondistended, nontender  MSK:   Moves extremities without difficulty  Neuro:  Speech clear   Medical Decision Making  Medically screening exam initiated at 7:11 PM.  Appropriate orders placed.  Jordan Perkins was informed that the remainder of the evaluation will be completed by another provider, this initial triage assessment does not replace that evaluation, and the importance of remaining in the ED until their evaluation is complete.  Clinical Impression  CHF exacerbation   Victorino Dike, [facility] 04/30/20 2042

## 2020-04-30 NOTE — Consult Note (Signed)
[facility] for Electrolyte Monitoring and Replacement   Recent Labs: Potassium (mmol/L)  Date Value  04/30/2020 4.9  07/07/2013 4.1   Magnesium (mg/dL)  Date Value  04/03/2020 2.1  06/07/2013 1.7 (L)   Calcium (mg/dL)  Date Value  04/30/2020 8.5 (L)   Calcium, Total (mg/dL)  Date Value  07/07/2013 9.6   Albumin (g/dL)  Date Value  04/14/2020 3.4 (L)  06/09/2013 2.6 (L)   Phosphorus (mg/dL)  Date Value  04/02/2020 3.2   Sodium (mmol/L)  Date Value  04/30/2020 113 (LL)  04/29/2020 119 (LL)  07/07/2013 136   Assessment: Pharmacy consulted monitoring of sodium in a patient who presented with hyponatremia and was started on NaCl 3% hypertonic solution.  Goal: Increase Na by 4-6 mEq in 4-6 hours. Avoid increase in Na 10-12 mEq in 24 hours.  Plan:  Contact pharmacy if Na increases > 4 mEq/ in 2 hours or > 6 mEq/L in 4 hours. If Na rises by > 6 mEq in 6 hours, stop 3% NaCl. D/W Dr Damita Dunnings and per conversation w/ nephrology plan to infuse NaCl x 3 hours only then check Na.  Will f/u after labs reviewed.    Ena Dawley ,PharmD Clinical Pharmacist 04/30/2020 10:57 PM

## 2020-04-30 NOTE — Telephone Encounter (Signed)
Called to discuss Na 119 and need to present to ED for repeat lab draw. He was also called by on-call MD at 0600 and was given instruction to present to ED. As it does not appear he has yet presented to ED, attempted to call again with no answer and no VM set up.   Loel Dubonnet, NP

## 2020-04-30 NOTE — Consult Note (Signed)
[facility] for Electrolyte Monitoring and Replacement   Recent Labs: Potassium (mmol/L)  Date Value  04/30/2020 4.9  07/07/2013 4.1   Magnesium (mg/dL)  Date Value  04/03/2020 2.1  06/07/2013 1.7 (L)   Calcium (mg/dL)  Date Value  04/30/2020 8.5 (L)   Calcium, Total (mg/dL)  Date Value  07/07/2013 9.6   Albumin (g/dL)  Date Value  04/14/2020 3.4 (L)  06/09/2013 2.6 (L)   Phosphorus (mg/dL)  Date Value  04/02/2020 3.2   Sodium (mmol/L)  Date Value  04/30/2020 113 (LL)  04/29/2020 119 (LL)  07/07/2013 136     Assessment: Pharmacy consulted monitoring of sodium in a patient who presented with hyponatremia and was started on NaCl 3% hypertonic solution.  Goal: Increase Na by 4-6 mEq in 4-6 hours. Avoid increase in Na 10-12 mEq in 24 hours.  Plan:  Contact pharmacy if Na increases > 4 mEq/ in 2 hours or > 6 mEq/L in 4 hours. If Na rises by > 6 mEq in 6 hours, stop 3% NaCl.   Will monitor Na Q2H x2 occurrences, then Q4H thereafter.   Rowland Lathe ,PharmD Clinical Pharmacist 04/30/2020 9:16 PM

## 2020-04-30 NOTE — ED Triage Notes (Signed)
Pt states has had leg weakness for several weeks. Pt also complains of headache, "off and on" for days. Pt with noted bilateral lower extremity edema. Pt denies shob, chest pain. Pt is a very poor historian. Denies nausea, vomiting, known fever.

## 2020-04-30 NOTE — ED Provider Notes (Signed)
[facility] Emergency Department Provider Note   ____________________________________________   First MD Initiated Contact with Patient 04/30/20 2020     (approximate)  I have reviewed the triage vital signs and the nursing notes.   HISTORY  Chief Complaint Weakness and Leg Swelling    HPI Jordan Perkins is a 79 y.o. male with past medical history of CAD status post CABG, hypertension, hyperlipidemia, atrial fibrillation, CKD, CHF, and pulmonary hypertension who presents to the ED complaining of weakness and leg swelling.  Patient reports that he has been feeling increasingly weak over the past 3 to 4 days along with increased swelling in his lower extremities.  He went to see his cardiologist for this yesterday, who increased his dose of Lasix and check basic labs.  He was told today that his sodium was dangerously low and that he needed to go to the ER for evaluation.  He currently reports a mild headache but denies any nausea, vomiting, numbness, or weakness.  He states he has been making a normal amount of urine and denies any cough, chest pain, or shortness of breath.        Past Medical History:  Diagnosis Date  . (HFpEF) heart failure with preserved ejection fraction ([place])   . Coronary artery disease   . Hyperlipidemia   . Hypertension   . PAH (pulmonary artery hypertension) ([place])   . Permanent atrial fibrillation ([place])   . Pleural effusion     Patient Active Problem List   Diagnosis Date Noted  . Fecal occult blood test positive 04/15/2020  . Coronary artery disease   . Fall   . Hypokalemia   . Bradycardia   . Stage 3a chronic kidney disease 07/12/2019  . Prediabetes 06/12/2019  . Macrocytic anemia 03/10/2019  . Pulmonary hypertension, unspecified ([place])   . Pleural effusion   . CHF (congestive heart failure) ([place]) 02/16/2019  . PAD (peripheral artery disease) ([place]) 01/02/2019  . Bilateral carotid artery disease ([place]) 01/02/2019    . Memory difficulty 11/05/2016  . Seborrheic keratoses 11/05/2016  . Hyponatremia 10/29/2016  . Encounter for anticoagulation discussion and counseling 07/21/2014  . CAD (coronary artery disease) 06/23/2013  . 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  . Atrial fibrillation ([place]) 06/23/2013  . Hyperlipidemia 06/23/2013  . Essential hypertension 06/23/2013  . Gallstone pancreatitis 06/23/2013    Past Surgical History:  Procedure Laterality Date  . CARDIAC CATHETERIZATION    . CHOLECYSTECTOMY    . CORONARY ANGIOPLASTY  06/2004   s/p stent placement @ [facility]  . CORONARY ARTERY BYPASS GRAFT  04-24-2004   CABG x 3 [facility]    Prior to Admission medications   Medication Sig Start Date End Date Taking? Authorizing Provider  atorvastatin (LIPITOR) 80 MG tablet TAKE 1 TABLET BY MOUTH EVERY DAY 03/28/20   [name], MD  ferrous sulfate 325 (65 FE) MG tablet Take 1 tablet (325 mg total) by mouth daily. 04/06/20 04/06/21  Fritzi Mandes, MD  furosemide (LASIX) 40 MG tablet Take 1 tablet (40 mg total) by mouth daily. 04/14/20   Loel Dubonnet, NP  lisinopril (ZESTRIL) 40 MG tablet Take 1 tablet (40 mg total) by mouth daily. 01/02/19 04/29/20  [name], MD  spironolactone (ALDACTONE) 25 MG tablet Take 1 tablet (25 mg total) by mouth daily. 04/14/20   Loel Dubonnet, NP    Allergies Sulfa antibiotics and Other  Family History  Problem Relation Age of Onset  . Heart disease  Father   . Heart disease Paternal Uncle   . Heart attack Brother     Social History Social History   Tobacco Use  . Smoking status: Never Smoker  . Smokeless tobacco: Never Used  Substance Use Topics  . Alcohol use: No  . Drug use: No    Review of Systems  Constitutional: No fever/chills.  Positive for generalized weakness. Eyes: No visual changes. ENT: No sore throat. Cardiovascular: Denies chest pain. Respiratory: Denies shortness of breath. Gastrointestinal: No abdominal pain.  No nausea,  no vomiting.  No diarrhea.  No constipation. Genitourinary: Negative for dysuria. Musculoskeletal: Negative for back pain.  Positive for leg swelling. Skin: Negative for rash. Neurological: Positive for for headaches, negative for focal weakness or numbness.  ____________________________________________   PHYSICAL EXAM:  VITAL SIGNS: ED Triage Vitals  Enc Vitals Group     BP 04/30/20 1859 (!) 152/58     Pulse Rate 04/30/20 1859 66     Resp 04/30/20 1859 20     Temp 04/30/20 1859 98.5 F (36.9 C)     Temp Source 04/30/20 1859 Oral     SpO2 04/30/20 1859 98 %     Weight 04/30/20 1900 178 lb (80.7 kg)     Height 04/30/20 1900 5\' 11"  (1.803 m)     Head Circumference --      Peak Flow --      Pain Score 04/30/20 1859 4     Pain Loc --      Pain Edu? --      Excl. in [place]? --     Constitutional: Alert and oriented. Eyes: Conjunctivae are normal. Head: Atraumatic. Nose: No congestion/rhinnorhea. Mouth/Throat: Mucous membranes are moist. Neck: Normal ROM Cardiovascular: Normal rate, regular rhythm. Grossly normal heart sounds. Respiratory: Normal respiratory effort.  No retractions. Lungs CTAB. Gastrointestinal: Soft and nontender. No distention. Genitourinary: deferred Musculoskeletal: No lower extremity tenderness, 2+ pitting edema to knees bilaterally. Neurologic:  Normal speech and language. No gross focal neurologic deficits are appreciated. Skin:  Skin is warm, dry and intact. No rash noted. Psychiatric: Mood and affect are normal. Speech and behavior are normal.  ____________________________________________   LABS (all labs ordered are listed, but only abnormal results are displayed)  Labs Reviewed  BASIC METABOLIC PANEL - Abnormal; Notable for the following components:      Result Value   Sodium 113 (*)    Chloride 81 (*)    Glucose, Bld 111 (*)    BUN 27 (*)    Creatinine, Ser 1.30 (*)    Calcium 8.5 (*)    GFR calc non Af Amer 53 (*)    All other  components within normal limits  CBC - Abnormal; Notable for the following components:   RBC 2.64 (*)    Hemoglobin 9.0 (*)    HCT 25.7 (*)    MCH 34.1 (*)    All other components within normal limits  BRAIN NATRIURETIC PEPTIDE - Abnormal; Notable for the following components:   B Natriuretic Peptide 283.6 (*)    All other components within normal limits  SARS CORONAVIRUS 2 BY RT PCR (HOSPITAL ORDER, [facility])  OSMOLALITY  OSMOLALITY, URINE  SODIUM, URINE, RANDOM  SODIUM  SODIUM  SODIUM  TROPONIN I (HIGH SENSITIVITY)  TROPONIN I (HIGH SENSITIVITY)   ____________________________________________  EKG  ED ECG REPORT I, Blake Divine, the attending physician, personally viewed and interpreted this ECG.   Date: 04/30/2020  EKG Time: 18:59  Rate: 67  Rhythm: atrial fibrillation, rate 67  Axis: RAD  Intervals:none  ST&T Change: Inferior T wave inversions   PROCEDURES  Procedure(s) performed (including Critical Care):  .Critical Care Performed by: Blake Divine, MD Authorized by: Blake Divine, MD   Critical care provider statement:    Critical care time (minutes):  45   Critical care time was exclusive of:  Separately billable procedures and treating other patients and teaching time   Critical care was necessary to treat or prevent imminent or life-threatening deterioration of the following conditions:  Metabolic crisis   Critical care was time spent personally by me on the following activities:  Discussions with consultants, evaluation of patient's response to treatment, examination of patient, ordering and performing treatments and interventions, ordering and review of laboratory studies, ordering and review of radiographic studies, pulse oximetry, re-evaluation of patient's condition, obtaining history from patient or surrogate and review of old charts   I assumed direction of critical care for this patient from another provider in my  specialty: no       ____________________________________________   INITIAL IMPRESSION / ASSESSMENT AND PLAN / ED COURSE       79 year old male with past medical history of CAD status post CABG, hypertension, hyperlipidemia, atrial fibrillation, CKD, CHF, and pulmonary hypertension who presents to the ED complaining of generalized weakness for the past 3 to 4 days along with increasing swelling in his lower extremities.  He was found to have a markedly low sodium during regular outpatient visit yesterday and advised to come to the ED for evaluation.  Repeat sodium today is 113, although patient is awake and alert at this time and has relatively mild symptoms with a mild headache.  He has no focal deficits on exam and remainder of labs are unremarkable.  Case discussed with Dr. Candiss Norse of nephrology, who recommends short duration of hypertonic saline infusion with 30 cc an hour for 3 hours of 3% saline.  Case discussed with hospitalist for admission.      ____________________________________________   FINAL CLINICAL IMPRESSION(S) / ED DIAGNOSES  Final diagnoses:  Hyponatremia  Generalized weakness     ED Discharge Orders    None       Note:  This document was prepared using Dragon voice recognition software and may include unintentional dictation errors.   Blake Divine, MD 04/30/20 2154

## 2020-04-30 NOTE — ED Notes (Signed)
Date and time results received: 04/30/20  (use smartphrase ".now" to insert current time)  Test: Na Critical Value: 113  Name of Provider Notified: Dr. Charna Archer  Orders Received? Or Actions Taken?:

## 2020-04-30 NOTE — Telephone Encounter (Signed)
Spoke with emergency services. They were able to go to Jordan Perkins home and speak with him. He answered the door and was very pleasant. He was cognitively intact. He verbalized understanding that he was to come to ED and was working on getting a ride.   Suspect his hyponatremia is dilutional as he had significant lower extremity edema on exam in clinic 04/29/20.   Touched base with hospital [facility] team at [facility]. Made them aware of the situation.  Jordan Dubonnet, NP

## 2020-04-30 NOTE — H&P (Signed)
History and Physical    Jordan Perkins SWN:[id number] DOB: 11/23/1940 DOA: 04/30/2020  PCP: [name], MD   Patient coming from: Home  I have personally briefly reviewed patient's old medical records in [place]  Chief Complaint: Low sodium  HPI: Jordan Perkins is a 79 y.o. male with medical history significant for hypertension, hyperlipidemia, A. fib off Xarelto since 7/14 due to frequent falls, anemia, pulmonary artery hypertension, coronary artery disease, CABG, diastolic congestive heart failure, anemia, CKD stage III and possible dementia, who presents to the ER on referral from his cardiologist for low sodium of 119.  Patient went to see his cardiologist yesterday with a complaint of increasing weakness and lower extremity edema for the past 3 to 4 days.  Patient denies shortness of breath beyond his baseline.  Has a mild headache but denies nausea, vomiting, visual disturbance or problems with thinking unsteady gait.  Patient was recently hospitalized from 7/14-7/28 with anasarca and was aggressively diuresed. ED Course: On arrival, patient was alert and oriented x3 with normal vitals.  Repeat blood work showed sodium of 113.  Hemoglobin 9.0 which is around his baseline.  BNP 283.  Troponin normal at 8.  Serum and urine osmolality and urine sodium pending.  EKG showed rate controlled A. fib with no acute ST-T wave changes.  Chest x-ray with small right pleural effusion and very mild bibasilar atelectasis.  The emergency room provider spoke with on-call nephrologist who recommended 3% saline at 30 mils per hour x3 hours with reassessment of sodium at that time and continued management thereafter.  Hospitalist, consulted for admission.  Review of Systems: As per HPI otherwise all other systems on review of systems negative.    Past Medical History:  Diagnosis Date  . (HFpEF) heart failure with preserved ejection fraction ([place])   . Coronary artery disease   .  Hyperlipidemia   . Hypertension   . PAH (pulmonary artery hypertension) ([place])   . Permanent atrial fibrillation ([place])   . Pleural effusion     Past Surgical History:  Procedure Laterality Date  . CARDIAC CATHETERIZATION    . CHOLECYSTECTOMY    . CORONARY ANGIOPLASTY  06/2004   s/p stent placement @ [facility]  . CORONARY ARTERY BYPASS GRAFT  04-24-2004   CABG x 3 [facility]     reports that he has never smoked. He has never used smokeless tobacco. He reports that he does not drink alcohol and does not use drugs.  Allergies  Allergen Reactions  . Sulfa Antibiotics Rash    Rash/hives   . Other Rash    Family History  Problem Relation Age of Onset  . Heart disease Father   . Heart disease Paternal Uncle   . Heart attack Brother       Prior to Admission medications   Medication Sig Start Date End Date Taking? Authorizing Provider  atorvastatin (LIPITOR) 80 MG tablet TAKE 1 TABLET BY MOUTH EVERY DAY 03/28/20   [name], MD  ferrous sulfate 325 (65 FE) MG tablet Take 1 tablet (325 mg total) by mouth daily. 04/06/20 04/06/21  Fritzi Mandes, MD  furosemide (LASIX) 40 MG tablet Take 1 tablet (40 mg total) by mouth daily. 04/14/20   Loel Dubonnet, NP  lisinopril (ZESTRIL) 40 MG tablet Take 1 tablet (40 mg total) by mouth daily. 01/02/19 04/29/20  [name], MD  spironolactone (ALDACTONE) 25 MG tablet Take 1 tablet (25 mg total) by mouth daily. 04/14/20  Loel Dubonnet, NP    Physical Exam: Vitals:   04/30/20 1859 04/30/20 1900 04/30/20 2216  BP: (!) 152/58  (!) 152/65  Pulse: 66  67  Resp: 20  18  Temp: 98.5 F (36.9 C)    TempSrc: Oral    SpO2: 98%  97%  Weight:  80.7 kg   Height:  5\' 11"  (1.803 m)      Vitals:   04/30/20 1859 04/30/20 1900 04/30/20 2216  BP: (!) 152/58  (!) 152/65  Pulse: 66  67  Resp: 20  18  Temp: 98.5 F (36.9 C)    TempSrc: Oral    SpO2: 98%  97%  Weight:  80.7 kg   Height:  5\' 11"  (1.803 m)       Constitutional:  Chronically  ill-appearing. Not in any apparent distress HEENT:      Head: Normocephalic and atraumatic.         Eyes: PERLA, EOMI, Conjunctivae are normal. Sclera is non-icteric.       Mouth/Throat: Mucous membranes are moist.       Neck: Supple with no signs of meningismus. Cardiovascular: Regular rate and rhythm. No murmurs, gallops, or rubs. 2+ symmetrical distal pulses are present . No JVD. No 2 +LE edema Respiratory: Respiratory effort normal .Lungs sounds clear bilaterally. No wheezes, crackles, or rhonchi.  Gastrointestinal: Soft, non tender, and non distended with positive bowel sounds. No rebound or guarding. Genitourinary: No CVA tenderness. Musculoskeletal: Nontender with normal range of motion in all extremities. No cyanosis, or erythema of extremities. Neurologic: Normal speech and language. Face is symmetric. Moving all extremities. No gross focal neurologic deficits . Skin:  Changes of chronic edema lower extremity Psychiatric: Mood and affect are normal Speech and behavior are normal   Labs on Admission: I have personally reviewed following labs and imaging studies  CBC: Recent Labs  Lab 04/29/20 1009 04/30/20 1911  WBC 6.8 7.8  HGB 9.1* 9.0*  HCT 25.9* 25.7*  MCV 97 97.3  PLT 210 081   Basic Metabolic Panel: Recent Labs  Lab 04/29/20 1009 04/30/20 1911  NA 119* 113*  K 5.1 4.9  CL 85* 81*  CO2 23 22  GLUCOSE 82 111*  BUN 17 27*  CREATININE 1.04 1.30*  CALCIUM 9.0 8.5*   GFR: Estimated Creatinine Clearance: 51.5 mL/min (A) (by C-G formula based on SCr of 1.3 mg/dL (H)). Liver Function Tests: No results for input(s): AST, ALT, ALKPHOS, BILITOT, PROT, ALBUMIN in the last 168 hours. No results for input(s): LIPASE, AMYLASE in the last 168 hours. No results for input(s): AMMONIA in the last 168 hours. Coagulation Profile: No results for input(s): INR, PROTIME in the last 168 hours. Cardiac Enzymes: No results for input(s): CKTOTAL, CKMB, CKMBINDEX, TROPONINI in  the last 168 hours. BNP (last 3 results) No results for input(s): PROBNP in the last 8760 hours. HbA1C: No results for input(s): HGBA1C in the last 72 hours. CBG: No results for input(s): GLUCAP in the last 168 hours. Lipid Profile: No results for input(s): CHOL, HDL, LDLCALC, TRIG, CHOLHDL, LDLDIRECT in the last 72 hours. Thyroid Function Tests: No results for input(s): TSH, T4TOTAL, FREET4, T3FREE, THYROIDAB in the last 72 hours. Anemia Panel: No results for input(s): VITAMINB12, FOLATE, FERRITIN, TIBC, IRON, RETICCTPCT in the last 72 hours. Urine analysis:    Component Value Date/Time   COLORURINE YELLOW (A) 03/23/2020 0913   APPEARANCEUR CLEAR (A) 03/23/2020 0913   APPEARANCEUR Hazy 06/06/2013 0947   LABSPEC 1.011 03/23/2020 0913  LABSPEC 1.021 06/06/2013 0947   PHURINE 7.0 03/23/2020 0913   GLUCOSEU NEGATIVE 03/23/2020 0913   GLUCOSEU Negative 06/06/2013 0947   HGBUR NEGATIVE 03/23/2020 0913   BILIRUBINUR NEGATIVE 03/23/2020 0913   BILIRUBINUR Negative 06/06/2013 0947   KETONESUR NEGATIVE 03/23/2020 0913   PROTEINUR NEGATIVE 03/23/2020 0913   NITRITE NEGATIVE 03/23/2020 0913   LEUKOCYTESUR NEGATIVE 03/23/2020 0913   LEUKOCYTESUR Negative 06/06/2013 0947    Radiological Exams on Admission: DG Chest 2 View  Result Date: 04/30/2020 CLINICAL DATA:  Weakness. EXAM: CHEST - 2 VIEW COMPARISON:  March 23, 2020 FINDINGS: Multiple sternal wires and vascular clips are seen. Very mild atelectasis is noted within the lateral aspects of the bilateral lung bases. There is a small right pleural effusion. No pneumothorax is identified. The heart size and mediastinal contours are within normal limits. The visualized skeletal structures are unremarkable. IMPRESSION: 1. Evidence of prior median sternotomy/CABG. 2. Very mild bibasilar atelectasis. 3. Small right pleural effusion. Electronically Signed   By: Virgina Norfolk M.D.   On: 04/30/2020 19:34    EKG: Independently reviewed.  Interpretation : A. fib at rate of 67.  No acute ST-T wave changes  Assessment/Plan 79 year old male with history of hypertension, hyperlipidemia, A. fib off Xarelto since 7/14 due to frequent falls, anemia, pulmonary artery hypertension, coronary artery disease, CABG, diastolic congestive heart failure, anemia, CKD stage III and possible dementia, recently hospitalized  from 7/14-7/28 with anasarca presented with sodium of 113, symptomatic for mild headache and weakness    Hyponatremia -Suspecting hypervolemic given recent anasarca and continued fluid overload but patient did recently receive aggressive diuresis -Continue 3% sodium at 50 mils per hour for 3 hours as recommended by nephrology with reevaluation and sodium checks per protocol -: Nephrology consult for continued management -Follow urine and sodium osmolalities and urine sodium    CAD (coronary artery disease) -Continue home meds pending med rec    Atrial fibrillation ([place]) -Continue rate control agents -Not on systemic anticoagulation due to history of falls, chronic anemia with history of positive FOBT per documentation from hospitalization in July 2021    Essential hypertension -Continue home meds but hold antidiuretics for now    Stage 3a chronic kidney disease -Mild increase in creatinine but for the most part at baseline    Coronary artery disease -No complaints of chest pain, troponin negative and EKG nonacute    Chronic diastolic CHF (congestive heart failure) ([place]) -Patient with baseline shortness of breath, mild elevation in BNP and lower extremity edema but chest x-ray with mild pleural effusion -Mild fluid overload at best -Continue to monitor closely and add back diuretics if necessary    DVT prophylaxis: Lovenox  Code Status: full code  Family Communication:  none  Disposition Plan: Back to previous home environment Consults called: Nephrology Status:At the time of admission, it appears that the  appropriate admission status for this patient is INPATIENT. This is judged to be reasonable and necessary in order to provide the required intensity of service to ensure the patient's safety given the presenting symptoms, physical exam findings, and initial radiographic and laboratory data in the context of their  Comorbid conditions.   Patient requires inpatient status due to high intensity of service, high risk for further deterioration and high frequency of surveillance required.   I certify that at the point of admission it is my clinical judgment that the patient will require inpatient hospital care spanning beyond [place] MD [facility]     04/30/2020, 10:21 PM

## 2020-04-30 NOTE — ED Notes (Signed)
Urine sample sent to lab

## 2020-04-30 NOTE — Telephone Encounter (Signed)
Patient had not presented to [facility] per review of chart. Attempted to call again with no answer and no VM set up. Due to safety concern with low Na (119) and no answer - emergency services called and requested wellness check.   Loel Dubonnet, NP

## 2020-04-30 NOTE — Telephone Encounter (Signed)
Notified by [facility] (8/21 am) regarding critical lab value, Na 119  The lab says they rechecked twice and this is true value- Na 119 (not diluted sample)  Called the patient multiple times right away, no answer. The patient finally picked up the phone at 6 am. Asked if he was symptomatic- he currently denies any chest pain, sob, dizziness, n, v, headache. Once in a while he gets dizzy and sob- none at the moment. Told him about the critical value and immediately come to the ER.  He will arrange for a transport (have his friend drive him) and come to [facility] or the nearest ER to recheck BMP and volume status - unclear etiology of acute hyponatremia- differentials include hypervolemic or euvolemic(SIADH). Regardless, he needs to come in.  Renae Fickle, MD Cardiology

## 2020-05-01 ENCOUNTER — Inpatient Hospital Stay: Payer: Medicare HMO

## 2020-05-01 LAB — SODIUM
Sodium: 117 mmol/L — CL (ref 135–145)
Sodium: 118 mmol/L — CL (ref 135–145)

## 2020-05-01 LAB — OSMOLALITY, URINE: Osmolality, Ur: 162 mOsm/kg — ABNORMAL LOW (ref 300–900)

## 2020-05-01 LAB — ALBUMIN: Albumin: 2.7 g/dL — ABNORMAL LOW (ref 3.5–5.0)

## 2020-05-01 LAB — SARS CORONAVIRUS 2 BY RT PCR (HOSPITAL ORDER, PERFORMED IN ~~LOC~~ HOSPITAL LAB): SARS Coronavirus 2: NEGATIVE

## 2020-05-01 LAB — SODIUM, URINE, RANDOM: Sodium, Ur: 31 mmol/L

## 2020-05-01 LAB — OSMOLALITY: Osmolality: 263 mOsm/kg — ABNORMAL LOW (ref 275–295)

## 2020-05-01 MED ORDER — FERROUS SULFATE 325 (65 FE) MG PO TABS
325.0000 mg | ORAL_TABLET | Freq: Every day | ORAL | Status: DC
  Filled 2020-05-01: qty 1

## 2020-05-01 MED ORDER — HYDROCERIN EX CREA
TOPICAL_CREAM | Freq: Two times a day (BID) | CUTANEOUS | Status: DC
  Filled 2020-05-01 (×2): qty 113

## 2020-05-01 MED ORDER — ATORVASTATIN CALCIUM 80 MG PO TABS
80.0000 mg | ORAL_TABLET | Freq: Every day | ORAL | Status: DC
  Administered 2020-05-01 – 2020-05-05 (×5): 80 mg via ORAL
  Filled 2020-05-01 (×5): qty 4

## 2020-05-01 MED ORDER — SENNOSIDES-DOCUSATE SODIUM 8.6-50 MG PO TABS
2.0000 | ORAL_TABLET | Freq: Once | ORAL | Status: AC
  Administered 2020-05-01: 2 via ORAL
  Filled 2020-05-01: qty 2

## 2020-05-01 MED ORDER — FERROUS SULFATE 325 (65 FE) MG PO TABS
325.0000 mg | ORAL_TABLET | Freq: Every day | ORAL | Status: DC
  Administered 2020-05-01 – 2020-05-05 (×5): 325 mg via ORAL
  Filled 2020-05-01 (×6): qty 1

## 2020-05-01 MED ORDER — ENSURE ENLIVE PO LIQD
237.0000 mL | Freq: Two times a day (BID) | ORAL | Status: DC
  Administered 2020-05-02 – 2020-05-04 (×6): 237 mL via ORAL

## 2020-05-01 MED ORDER — SPIRONOLACTONE 25 MG PO TABS
25.0000 mg | ORAL_TABLET | Freq: Every day | ORAL | Status: DC
  Administered 2020-05-02 – 2020-05-03 (×2): 25 mg via ORAL
  Filled 2020-05-01 (×4): qty 1

## 2020-05-01 MED ORDER — DOCUSATE SODIUM 100 MG PO CAPS
100.0000 mg | ORAL_CAPSULE | Freq: Every day | ORAL | Status: DC
  Administered 2020-05-01: 100 mg via ORAL
  Filled 2020-05-01: qty 1

## 2020-05-01 MED ORDER — DICLOFENAC SODIUM 1 % EX GEL
2.0000 g | Freq: Two times a day (BID) | CUTANEOUS | Status: DC
  Administered 2020-05-01 – 2020-05-04 (×7): 2 g via TOPICAL
  Filled 2020-05-01 (×2): qty 100

## 2020-05-01 MED ORDER — MAGNESIUM CITRATE PO SOLN
1.0000 | Freq: Once | ORAL | Status: DC
  Filled 2020-05-01: qty 296

## 2020-05-01 MED ORDER — ALBUMIN HUMAN 25 % IV SOLN
12.5000 g | Freq: Two times a day (BID) | INTRAVENOUS | Status: DC
  Administered 2020-05-01 – 2020-05-05 (×9): 12.5 g via INTRAVENOUS
  Filled 2020-05-01 (×11): qty 50

## 2020-05-01 NOTE — ED Notes (Signed)
Unit secretary paged Dr. Damita Dunnings so RN can tell her critical sodium of 117. RN talked with pharmacy and Nicki Reaper recommends restarting hypertonic saline. Dr. Damita Dunnings messaged in secure chat with this information and with critical value of sodium.

## 2020-05-01 NOTE — ED Notes (Signed)
Dr. Damita Dunnings messaged in secure chat and responded. At this time, do not restart the hypertonic saline and that neuro checks can be changed to every 2 hours.

## 2020-05-01 NOTE — Progress Notes (Signed)
PROGRESS NOTE    Patient: Jordan Perkins                            PCP: [name], MD                    DOB: 06-17-1941            DOA: 04/30/2020 [id number]             DOS: 05/01/2020, 7:37 AM   LOS: 1 day   Date of Service: The patient was seen and examined on 05/01/2020  Subjective:   The patient was seen and examined this Am. Stable .Marland Kitchen No acute distress denies any chest pain or shortness of breath Denies any dizziness just generalized weaknesses, and constipation    Brief Narrative:   Minh Roanhorse is a 79 y.o. male with medical history significant for hypertension, hyperlipidemia,   A. fib off Xarelto since 7/14 due to frequent falls, anemia, pulmonary artery hypertension, coronary artery disease, CABG, diastolic congestive heart failure, anemia, CKD stage III and possible dementia, who presents to the ER on referral from his cardiologist for low sodium of 119.    Patient was recently hospitalized from 7/14-7/28 with anasarca and was aggressively diuresed.   ED Course: On arrival, patient was alert and oriented x3 with normal vitals.   Repeat blood work showed sodium of 113.  Hemoglobin 9.0 which is around his baseline.  BNP 283. Troponin normal at 8. EKG showed rate controlled A. fib with no acute ST-T wave changes.  Chest x-ray with small right pleural effusion and very mild bibasilar atelectasis.  The emergency room provider spoke with on-call nephrologist who recommended 3% saline at 30 mils per hour x3 hours with reassessment of sodium at that time and continued management thereafter.  Hospitalist, consulted for admission.    Assessment & Plan:   Principal Problem:   Hyponatremia Active Problems:   CAD (coronary artery disease)   Atrial fibrillation (H[facility])   Essential hypertension   Stage 3a chronic kidney disease   Coronary artery disease   Chronic diastolic CHF (congestive heart failure) ([facility])    Hyponatremia -Sodium level on  admission 113, 117, 118 now -Nephrology Dr. Candiss Norse following appreciate input -Was initiated on hypertonic 3% NS -on hold for now -Continue sodium checks every 3 hours -avoiding rapid, overcorrection  -Suspecting hypervolemic given recent anasarca and continued fluid overload but patient did recently receive aggressive diuresis  -Follow urine and sodium osmolalities and urine sodium -Continue home meds pending med rec    Atrial fibrillation ([place]) -Rate well controlled at this time, heart rate 68 this a.m. -Home meds reviewed not on any rate control medication -including beta-blockers  -Not on systemic anticoagulation due to history of falls, chronic anemia with history of positive FOBT per documentation from hospitalization in July 2021    Essential hypertension -Currently stable, with holding home medication of Lasix, lisinopril, Aldactone Due to hyponatremia,   Stage 3a chronic kidney disease -Monitoring BUN/creatinine closely... Creatinine on admission 2.67 -Mild increase in creatinine but for the most part at baseline    Coronary artery disease -Denies any shortness of breath, for some reason patient is not on any beta-blockers, Patient is ACE inhibitor, on hold, continue statin -No complaints of chest pain, troponin negative and EKG nonacute    Chronic diastolic CHF (congestive heart failure) (HCC) -Remains euvolemic, Lasix, lisinopril and Aldactone on hold -Patient with  baseline shortness of breath, mild elevation in BNP and lower extremity edema but chest x-ray with mild pleural effusion -Continue to monitor closely and add back diuretics if necessary     Consultants: Nephrology    --------------------------------------------------------------------------------------------------------------------------------------------  DVT prophylaxis:  SCD/Compression stockings and Lovenox SQ Code Status:   Code Status: Full Code Family Communication: No family member  present at bedside- attempt will be made to update daily The above findings and plan of care has been discussed with patient   in detail,  they expressed understanding and agreement of above. -Advance care planning has been discussed.   Admission status:    Status is: Inpatient  Remains inpatient appropriate because:Inpatient level of care appropriate due to severity of illness   Dispo: The patient is from: Home              Anticipated d/c is to: Home              Anticipated d/c date is: 3 days              Patient currently is not medically stable to d/c.        Procedures:   No admission procedures for hospital encounter.    Antimicrobials:  Anti-infectives (From admission, onward)   None       Medication:  . docusate sodium  100 mg Oral Daily  . enoxaparin (LOVENOX) injection  40 mg Subcutaneous Q24H       Objective:   Vitals:   05/01/20 0200 05/01/20 0330 05/01/20 0430 05/01/20 0600  BP: (!) 150/70 (!) 136/56 (!) 138/59 139/60  Pulse: 78 64 62 68  Resp: (!) 21 17 18 17   Temp:      TempSrc:      SpO2: 99% 99% 98% 99%  Weight:      Height:        Intake/Output Summary (Last 24 hours) at 05/01/2020 0737 Last data filed at 05/01/2020 0217 Gross per 24 hour  Intake --  Output 1050 ml  Net -1050 ml   Filed Weights   04/30/20 1900  Weight: 80.7 kg     Examination:   Physical Exam  Constitution:  Alert, cooperative, no distress,  Appears calm and comfortable  Psychiatric: Normal and stable mood and affect, cognition intact,   HEENT: Normocephalic, PERRL, otherwise with in Normal limits  Chest:Chest symmetric Cardio vascular:  S1/S2, RRR, No murmure, No Rubs or Gallops  pulmonary: Clear to auscultation bilaterally, respirations unlabored, negative wheezes / crackles Abdomen: Soft, non-tender, non-distended, bowel sounds,no masses, no organomegaly Muscular skeletal: Limited exam - in bed, able to move all 4 extremities, Normal strength,   Neuro: CNII-XII intact. , normal motor and sensation, reflexes intact  Extremities: No pitting edema lower extremities, +2 pulses  Skin: Dry, warm to touch, negative for any Rashes, No open wounds Wounds: per nursing documentation    ------------------------------------------------------------------------------------------------------------------------------------------    LABs:  CBC Latest Ref Rng & Units 04/30/2020 04/29/2020 04/14/2020  WBC 4.0 - 10.5 K/uL 7.8 6.8 6.2  Hemoglobin 13.0 - 17.0 g/dL 9.0(L) 9.1(L) 8.4(L)  Hematocrit 39 - 52 % 25.7(L) 25.9(L) 24.0(L)  Platelets 150 - 400 K/uL 187 210 231   CMP Latest Ref Rng & Units 05/01/2020 05/01/2020 04/30/2020  Glucose 70 - 99 mg/dL - - 111(H)  BUN 8 - 23 mg/dL - - 27(H)  Creatinine 0.61 - 1.24 mg/dL - - 1.30(H)  Sodium 135 - 145 mmol/L 118(LL) 117(LL) 113(LL)  Potassium 3.5 - 5.1 mmol/L - -  4.9  Chloride 98 - 111 mmol/L - - 81(L)  CO2 22 - 32 mmol/L - - 22  Calcium 8.9 - 10.3 mg/dL - - 8.5(L)  Total Protein 6.0 - 8.5 g/dL - - -  Total Bilirubin 0.0 - 1.2 mg/dL - - -  Alkaline Phos 48 - 121 IU/L - - -  AST 0 - 40 IU/L - - -  ALT 0 - 44 IU/L - - -       Micro Results Recent Results (from the past 240 hour(s))  SARS Coronavirus 2 by RT PCR (hospital order, performed in [facility] hospital lab) Nasopharyngeal Nasopharyngeal Swab     Status: None   Collection Time: 05/01/20 12:15 AM   Specimen: Nasopharyngeal Swab  Result Value Ref Range Status   SARS Coronavirus 2 NEGATIVE NEGATIVE Final    Comment: (NOTE) SARS-CoV-2 target nucleic acids are NOT DETECTED.  The SARS-CoV-2 RNA is generally detectable in upper and lower respiratory specimens during the acute phase of infection. The lowest concentration of SARS-CoV-2 viral copies this assay can detect is 250 copies / mL. A negative result does not preclude SARS-CoV-2 infection and should not be used as the sole basis for treatment or other patient management decisions.  A  negative result may occur with improper specimen collection / handling, submission of specimen other than nasopharyngeal swab, presence of viral mutation(s) within the areas targeted by this assay, and inadequate number of viral copies (<250 copies / mL). A negative result must be combined with clinical observations, patient history, and epidemiological information.  Fact Sheet for Patients:   StrictlyIdeas.no  Fact Sheet for Healthcare Providers: BankingDealers.co.za  This test is not yet approved or  cleared by the [place] FDA and has been authorized for detection and/or diagnosis of SARS-CoV-2 by FDA under an Emergency Use Authorization (EUA).  This EUA will remain in effect (meaning this test can be used) for the duration of the COVID-19 declaration under Section 564(b)(1) of the Act, 21 U.S.C. section 360bbb-3(b)(1), unless the authorization is terminated or revoked sooner.  Performed at [facility], [address], [place], [place] [address]     Radiology Reports DG Chest 2 View  Result Date: 04/30/2020 CLINICAL DATA:  Weakness. EXAM: CHEST - 2 VIEW COMPARISON:  March 23, 2020 FINDINGS: Multiple sternal wires and vascular clips are seen. Very mild atelectasis is noted within the lateral aspects of the bilateral lung bases. There is a small right pleural effusion. No pneumothorax is identified. The heart size and mediastinal contours are within normal limits. The visualized skeletal structures are unremarkable. IMPRESSION: 1. Evidence of prior median sternotomy/CABG. 2. Very mild bibasilar atelectasis. 3. Small right pleural effusion. Electronically Signed   By: Virgina Norfolk M.D.   On: 04/30/2020 19:34   CT HEAD WO CONTRAST  Result Date: 05/01/2020 CLINICAL DATA:  Headache and multiple falls EXAM: CT HEAD WITHOUT CONTRAST TECHNIQUE: Contiguous axial images were obtained from the base of the skull through the  vertex without intravenous contrast. COMPARISON:  None. FINDINGS: Brain: There is no mass, hemorrhage or extra-axial collection. The appearance of the white matter is normal for the patient's age. There is generalized atrophy. Vascular: No abnormal hyperdensity of the major intracranial arteries or dural venous sinuses. No intracranial atherosclerosis. Skull: The visualized skull base, calvarium and extracranial soft tissues are normal. Sinuses/Orbits: No fluid levels or advanced mucosal thickening of the visualized paranasal sinuses. No mastoid or middle ear effusion. The orbits are normal. IMPRESSION: Generalized atrophy without  acute intracranial abnormality. Electronically Signed   By: Ulyses Jarred M.D.   On: 05/01/2020 06:15    SIGNED: Deatra James, MD, FACP, FHM. [facility],  Pager (please use [email or web address] to page/text)  If 7PM-7AM, please contact night-coverage [email or web address] Ota [facility] 05/01/2020, 7:37 AM

## 2020-05-01 NOTE — ED Notes (Signed)
Dr. Damita Dunnings was just at bedside.

## 2020-05-01 NOTE — ED Notes (Signed)
Dr. Leslie Dales and called RN to inform "Just saw the sodium. Looks good. . Won't hang any sodium right now. We can wait for further orders from Dr Candiss Norse or morning docs."

## 2020-05-01 NOTE — Consult Note (Signed)
[facility] for Electrolyte Monitoring and Replacement   Recent Labs: Potassium (mmol/L)  Date Value  04/30/2020 4.9  07/07/2013 4.1   Magnesium (mg/dL)  Date Value  04/03/2020 2.1  06/07/2013 1.7 (L)   Calcium (mg/dL)  Date Value  04/30/2020 8.5 (L)   Calcium, Total (mg/dL)  Date Value  07/07/2013 9.6   Albumin (g/dL)  Date Value  04/14/2020 3.4 (L)  06/09/2013 2.6 (L)   Phosphorus (mg/dL)  Date Value  04/02/2020 3.2   Sodium (mmol/L)  Date Value  05/01/2020 117 (LL)  04/29/2020 119 (LL)  07/07/2013 136   Assessment: Pharmacy consulted monitoring of sodium in a patient who presented with hyponatremia and was started on NaCl 3% hypertonic solution.  8/22 0215 Na 117 (up from 113 @ 1911)  Goal: Increase Na by 4-6 mEq in 4-6 hours. Avoid increase in Na 10-12 mEq in 24 hours.  Plan:  D/W Dr Damita Dunnings and per conversation w/ nephrology plan to infuse NaCl x 3 hours only then stop 3% NaCl.     Ena Dawley ,PharmD Clinical Pharmacist 05/01/2020 5:14 AM

## 2020-05-01 NOTE — ED Notes (Signed)
Pt taken to CT with CT tech.

## 2020-05-01 NOTE — ED Notes (Signed)
Niece Jordan Perkins is primary point of contact.  Brother is unable to understand information shared and is unable to help make any decisions.

## 2020-05-01 NOTE — ED Notes (Signed)
Lunch tray given. 

## 2020-05-01 NOTE — Consult Note (Addendum)
[address] [place], [place] [address] Phone [phone]. Fax [phone]  Date: 05/01/2020                  Patient Name:  Jordan Perkins  MRN: [id number]  DOB: [birth year]  Age / Sex: 79 y.o., male         PCP: [name], MD                 Service Requesting Consult: IM/ Deatra James, MD                 Reason for Consult:  Hyponatremia            History of Present Illness: Patient is a 79 y.o. male with medical problems of HTN, A Fib, CAD/CABG, [place], who was admitted to [facility] on 04/30/2020 for evaluation of Hyponatremia [E87.1]  Patient was referred from outpatient by his cardiologist for low sodium of 119.  He was seen for increasing weakness and lower extremity edema for the past 3 to 4 days.  Upon arrival in the ER his sodium was down to 113.  He was treated with IV 3% hypertonic saline which resulted in improvement of sodium to 117-118 this morning. SARS-CoV-2 test is negative    Medications: Outpatient medications: (Not in a hospital admission)   Current medications: Current Facility-Administered Medications  Medication Dose Route Frequency Provider Last Rate Last Admin  . atorvastatin (LIPITOR) tablet 80 mg  80 mg Oral Daily Shahmehdi, Seyed A, MD      . docusate sodium (COLACE) capsule 100 mg  100 mg Oral Daily Ouma, Bing Neighbors, NP      . enoxaparin (LOVENOX) injection 40 mg  40 mg Subcutaneous Q24H Judd Gaudier V, MD      . ferrous sulfate tablet 325 mg  325 mg Oral Daily Shahmehdi, Seyed A, MD      . magnesium citrate solution 1 Bottle  1 Bottle Oral Once Shahmehdi, Seyed A, MD      . spironolactone (ALDACTONE) tablet 25 mg  25 mg Oral Daily Shahmehdi, Seyed A, MD       Current Outpatient Medications  Medication Sig Dispense Refill  . atorvastatin (LIPITOR) 80 MG tablet TAKE 1 TABLET BY MOUTH EVERY DAY 90 tablet 0  . ferrous sulfate 325 (65 FE) MG tablet Take 1 tablet (325 mg total) by mouth daily. 30 tablet 3  .  furosemide (LASIX) 40 MG tablet Take 80 mg by mouth daily.    Marland Kitchen lisinopril (ZESTRIL) 40 MG tablet Take 1 tablet (40 mg total) by mouth daily. 90 tablet 3  . spironolactone (ALDACTONE) 25 MG tablet Take 1 tablet (25 mg total) by mouth daily. 90 tablet 3  . furosemide (LASIX) 40 MG tablet Take 1 tablet (40 mg total) by mouth daily. 90 tablet 3      Allergies: Allergies  Allergen Reactions  . Sulfa Antibiotics Rash    Rash/hives   . Other Rash      Past Medical History: Past Medical History:  Diagnosis Date  . (HFpEF) heart failure with preserved ejection fraction ([place])   . Coronary artery disease   . Hyperlipidemia   . Hypertension   . PAH (pulmonary artery hypertension) ([place])   . Permanent atrial fibrillation ([place])   . Pleural effusion      Past Surgical History: Past Surgical History:  Procedure Laterality Date  . CARDIAC CATHETERIZATION    . CHOLECYSTECTOMY    . CORONARY ANGIOPLASTY  06/2004   s/p stent placement @ [facility]  . CORONARY ARTERY BYPASS GRAFT  04-24-2004   CABG x 3 [facility]     Family History: Family History  Problem Relation Age of Onset  . Heart disease Father   . Heart disease Paternal Uncle   . Heart attack Brother      Social History: Social History   Socioeconomic History  . Marital status: Single    Spouse name: Not on file  . Number of children: Not on file  . Years of education: Not on file  . Highest education level: Not on file  Occupational History  . Not on file  Tobacco Use  . Smoking status: Never Smoker  . Smokeless tobacco: Never Used  Substance and Sexual Activity  . Alcohol use: No  . Drug use: No  . Sexual activity: Not on file  Other Topics Concern  . Not on file  Social History Narrative  . Not on file   Social Determinants of Health   Financial Resource Strain: Low Risk   . Difficulty of Paying Living Expenses: Not hard at all  Food Insecurity: No Food Insecurity  . Worried About Charity fundraiser in the Last  Year: Never true  . Ran Out of Food in the Last Year: Never true  Transportation Needs: Unmet Transportation Needs  . Lack of Transportation (Medical): Yes  . Lack of Transportation (Non-Medical): No  Physical Activity: Unknown  . Days of Exercise per Week: 0 days  . Minutes of Exercise per Session: Not on file  Stress: No Stress Concern Present  . Feeling of Stress : Not at all  Social Connections:   . Frequency of Communication with Friends and Family: Not on file  . Frequency of Social Gatherings with Friends and Family: Not on file  . Attends Religious Services: Not on file  . Active Member of Clubs or Organizations: Not on file  . Attends Archivist Meetings: Not on file  . Marital Status: Not on file  Intimate Partner Violence:   . Fear of Current or Ex-Partner: Not on file  . Emotionally Abused: Not on file  . Physically Abused: Not on file  . Sexually Abused: Not on file     Review of Systems: Gen: no fever chills HEENT: no vision or hearing c/o CV: Sometimes has shortness of breath with exertion.  He has swelling in both his legs.  He was asked to increase the dose of Lasix to twice a day for 4 days then back to once a day Resp: No cough or sputum production. Occasionally has sputum GI: Appetite is good, he has trouble with constipation sometimes;  no blood in the stool; also has runny loose stools sometimes GU : No problem with voiding.  Occasionally has hesitancy.  No blood in the stool.  No history of kidney stones MS: Able to walk at home with a cane and a walker Derm:    Has rash from dry skin on the arms Psych: No complaints Heme: No complaints Neuro: No complaints Endocrine.  No complaints  Vital Signs: Blood pressure (!) 122/59, pulse 68, temperature 98.5 F (36.9 C), temperature source Oral, resp. rate 16, height 5\' 11"  (1.803 m), weight 80.7 kg, SpO2 99 %.   Intake/Output Summary (Last 24 hours) at 05/01/2020 1036 Last data filed at 05/01/2020  0217 Gross per 24 hour  Intake --  Output 1050 ml  Net -1050 ml    Weight trends: [employer]  04/30/20 1900  Weight: 80.7 kg   Physical Exam: General:  No acute distress, laying in the bed, thin built  HEENT  anicteric, moist oral mucous membrane, temporal wasting  Pulm/lungs  normal breathing effort, lungs are clear to auscultation  CVS/Heart  irregular rhythm, bradycardic, no rub or gallop  Abdomen:   Soft, nontender  Extremities:  2+ peripheral edema  Neurologic:  Alert, oriented, able to follow commands  Skin:  No acute rashes, dry scaly skin on arms   Lab results: Basic Metabolic Panel: Recent Labs  Lab 04/29/20 1009 04/30/20 1911 05/01/20 0215 05/01/20 0514  NA 119* 113* 117* 118*  K 5.1 4.9  --   --   CL 85* 81*  --   --   CO2 23 22  --   --   GLUCOSE 82 111*  --   --   BUN 17 27*  --   --   CREATININE 1.04 1.30*  --   --   CALCIUM 9.0 8.5*  --   --     Liver Function Tests: No results for input(s): AST, ALT, ALKPHOS, BILITOT, PROT, ALBUMIN in the last 168 hours. No results for input(s): LIPASE, AMYLASE in the last 168 hours. No results for input(s): AMMONIA in the last 168 hours.  CBC: Recent Labs  Lab 04/29/20 1009 04/30/20 1911  WBC 6.8 7.8  HGB 9.1* 9.0*  HCT 25.9* 25.7*  MCV 97 97.3  PLT 210 187    Cardiac Enzymes: No results for input(s): CKTOTAL, TROPONINI in the last 168 hours.  BNP: Invalid input(s): POCBNP  CBG: No results for input(s): GLUCAP in the last 168 hours.  Microbiology: Recent Results (from the past 720 hour(s))  SARS Coronavirus 2 by RT PCR (hospital order, performed in [facility] hospital lab) Nasopharyngeal Nasopharyngeal Swab     Status: None   Collection Time: 05/01/20 12:15 AM   Specimen: Nasopharyngeal Swab  Result Value Ref Range Status   SARS Coronavirus 2 NEGATIVE NEGATIVE Final    Comment: (NOTE) SARS-CoV-2 target nucleic acids are NOT DETECTED.  The SARS-CoV-2 RNA is generally detectable in upper  and lower respiratory specimens during the acute phase of infection. The lowest concentration of SARS-CoV-2 viral copies this assay can detect is 250 copies / mL. A negative result does not preclude SARS-CoV-2 infection and should not be used as the sole basis for treatment or other patient management decisions.  A negative result may occur with improper specimen collection / handling, submission of specimen other than nasopharyngeal swab, presence of viral mutation(s) within the areas targeted by this assay, and inadequate number of viral copies (<250 copies / mL). A negative result must be combined with clinical observations, patient history, and epidemiological information.  Fact Sheet for Patients:   StrictlyIdeas.no  Fact Sheet for Healthcare Providers: BankingDealers.co.za  This test is not yet approved or  cleared by the [place] FDA and has been authorized for detection and/or diagnosis of SARS-CoV-2 by FDA under an Emergency Use Authorization (EUA).  This EUA will remain in effect (meaning this test can be used) for the duration of the COVID-19 declaration under Section 564(b)(1) of the Act, 21 U.S.C. section 360bbb-3(b)(1), unless the authorization is terminated or revoked sooner.  Performed at [facility], [address] [place], [place] [address]      Coagulation Studies: No results for input(s): LABPROT, INR in the last 72 hours.  Urinalysis: No results for input(s): COLORURINE, LABSPEC, [place], [place], [place], [place], [place], [place],  UROBILINOGEN, NITRITE, LEUKOCYTESUR in the last 72 hours.  Invalid input(s): APPERANCEUR      Imaging: DG Chest 2 View  Result Date: 04/30/2020 CLINICAL DATA:  Weakness. EXAM: CHEST - 2 VIEW COMPARISON:  March 23, 2020 FINDINGS: Multiple sternal wires and vascular clips are seen. Very mild atelectasis is noted within the lateral aspects of the bilateral  lung bases. There is a small right pleural effusion. No pneumothorax is identified. The heart size and mediastinal contours are within normal limits. The visualized skeletal structures are unremarkable. IMPRESSION: 1. Evidence of prior median sternotomy/CABG. 2. Very mild bibasilar atelectasis. 3. Small right pleural effusion. Electronically Signed   By: Virgina Norfolk M.D.   On: 04/30/2020 19:34   CT HEAD WO CONTRAST  Result Date: 05/01/2020 CLINICAL DATA:  Headache and multiple falls EXAM: CT HEAD WITHOUT CONTRAST TECHNIQUE: Contiguous axial images were obtained from the base of the skull through the vertex without intravenous contrast. COMPARISON:  None. FINDINGS: Brain: There is no mass, hemorrhage or extra-axial collection. The appearance of the white matter is normal for the patient's age. There is generalized atrophy. Vascular: No abnormal hyperdensity of the major intracranial arteries or dural venous sinuses. No intracranial atherosclerosis. Skull: The visualized skull base, calvarium and extracranial soft tissues are normal. Sinuses/Orbits: No fluid levels or advanced mucosal thickening of the visualized paranasal sinuses. No mastoid or middle ear effusion. The orbits are normal. IMPRESSION: Generalized atrophy without acute intracranial abnormality. Electronically Signed   By: Ulyses Jarred M.D.   On: 05/01/2020 06:15      Assessment & Plan: Pt is a 79 y.o. Caucasian  male with Hypertension, hyperlipidemia, atrial fibrillation, pulmonary hypertension, coronary disease status post CABG, diastolic CHF, anemia, CKD, , was admitted on 04/30/2020 with Hyponatremia [E87.1]   #Hyponatremia # LE edema Hyponatremia is likely from aggressive diuresis in the setting of total body volume overload and 3rd spacing of fluid Patient has poor nutrition status.  He has some protein calorie malnutrition -He lives in a trailer home with his brother.  Does not appear to have good family support.  -We will  start IV albumin for managing third spacing of fluid -Protein supplements, Ensure -Patient will benefit from dietitian eval Mobilization of fluid with IV albumin, hopefully, will correct hyponatremia - bladder scan   LOS: 1 Perris Tripathi 8/22/202110:36 AM    Note: This note was prepared with Dragon dictation. Any transcription errors are unintentional

## 2020-05-01 NOTE — ED Notes (Signed)
Date and time results received: 05/01/20 0602 (use smartphrase ".now" to insert current time)  Test: Sodium  Critical Value: 118  Name of Provider Notified: Rufina Falco  Orders Received? Or Actions Taken?: provider notified.

## 2020-05-01 NOTE — ED Notes (Signed)
Pt states coming in after his legs went out.

## 2020-05-01 NOTE — ED Notes (Signed)
Provider messaged: "I have a new concern on this pt. When doing a neuro assessment I asked pt to smile and his left side is not coming up like it did previously and when he sticks out his tongue it deviates some to the right. His speach is clear"  ER provider also notified.

## 2020-05-01 NOTE — ED Notes (Signed)
CT called and they stated they are coming for the pt now

## 2020-05-01 NOTE — ED Notes (Signed)
Awaiting ferrous sulfate from pharmacy - pharmacy messaged

## 2020-05-01 NOTE — ED Notes (Signed)
Dr Damita Dunnings messaged: "Pt states he feels like he needs to have a BM. I put pt on a bed pan and he was unable to go. Pt states this happens often and he just strains. Can we get a PRN stool softner?"

## 2020-05-01 NOTE — ED Notes (Signed)
Pt is back from CT. Pt on cardiac monitor, bp and pulse ox monitors, side rails up and call bell within reach

## 2020-05-02 ENCOUNTER — Encounter: Payer: Self-pay | Admitting: Internal Medicine

## 2020-05-02 LAB — BASIC METABOLIC PANEL
Anion gap: 8 (ref 5–15)
Anion gap: 11 (ref 5–15)
BUN: 25 mg/dL — ABNORMAL HIGH (ref 8–23)
BUN: 22 mg/dL (ref 8–23)
CO2: 23 mmol/L (ref 22–32)
CO2: 24 mmol/L (ref 22–32)
Calcium: 8.9 mg/dL (ref 8.9–10.3)
Calcium: 9.6 mg/dL (ref 8.9–10.3)
Chloride: 91 mmol/L — ABNORMAL LOW (ref 98–111)
Chloride: 91 mmol/L — ABNORMAL LOW (ref 98–111)
Creatinine, Ser: 1.04 mg/dL (ref 0.61–1.24)
Creatinine, Ser: 1.01 mg/dL (ref 0.61–1.24)
GFR calc Af Amer: >60 mL/min (ref >60)
GFR calc Af Amer: >60 mL/min (ref >60)
GFR calc non Af Amer: >60 mL/min (ref >60)
GFR calc non Af Amer: >60 mL/min (ref >60)
Glucose, Bld: 118 mg/dL — ABNORMAL HIGH (ref 70–99)
Glucose, Bld: 112 mg/dL — ABNORMAL HIGH (ref 70–99)
Potassium: 4.6 mmol/L (ref 3.5–5.1)
Potassium: 4.7 mmol/L (ref 3.5–5.1)
Sodium: 122 mmol/L — ABNORMAL LOW (ref 135–145)
Sodium: 126 mmol/L — ABNORMAL LOW (ref 135–145)

## 2020-05-02 LAB — MRSA PCR SCREENING: MRSA by PCR: NEGATIVE

## 2020-05-02 LAB — GLUCOSE, CAPILLARY: Glucose-Capillary: 113 mg/dL — ABNORMAL HIGH (ref 70–99)

## 2020-05-02 MED ORDER — RISAQUAD PO CAPS
2.0000 | ORAL_CAPSULE | Freq: Three times a day (TID) | ORAL | Status: DC
  Administered 2020-05-02 – 2020-05-05 (×10): 2 via ORAL
  Filled 2020-05-02 (×12): qty 2

## 2020-05-02 MED ORDER — PNEUMOCOCCAL VAC POLYVALENT 25 MCG/0.5ML IJ INJ: 0.5000 mL | INJECTION | INTRAMUSCULAR | Status: DC | PRN

## 2020-05-02 MED ORDER — CHLORHEXIDINE GLUCONATE CLOTH 2 % EX PADS
6.0000 | MEDICATED_PAD | Freq: Every day | CUTANEOUS | Status: DC
  Administered 2020-05-02 – 2020-05-04 (×3): 6 via TOPICAL

## 2020-05-02 MED ORDER — DOCUSATE SODIUM 100 MG PO CAPS: 100.0000 mg | ORAL_CAPSULE | Freq: Two times a day (BID) | ORAL | Status: DC | PRN

## 2020-05-02 NOTE — Progress Notes (Signed)
Patient's niece, [name], requested social work call her at the number in chart, before patient is discharged. Patient lives with his brother and will need some help at home after discharge.

## 2020-05-02 NOTE — Progress Notes (Signed)
[facility]  ROUNDING NOTE   Subjective:  Patient seen and evaluated at bedside. Currently awake and alert. Serum sodium up to 126.   Objective:  Vital signs in last 24 hours:  Temp:  [98.7 F (37.1 C)] 98.7 F (37.1 C) (08/23 0156) Pulse Rate:  [54-81] 64 (08/23 0600) Resp:  [15-23] 19 (08/23 0200) BP: (122-164)/(47-74) 147/54 (08/23 0600) SpO2:  [95 %-100 %] 100 % (08/23 0600) Weight:  [76.6 kg] 76.6 kg (08/23 0156)  Weight change: -4.14 kg Filed Weights   04/30/20 1900 05/02/20 0156  Weight: 80.7 kg 76.6 kg    Intake/Output: I/O last 3 completed shifts: In: 86.1 [IV Piggyback:86.1] Out: 1150 [Urine:1150]   Intake/Output this shift:  No intake/output data recorded.  Physical Exam: General:  No acute distress  Head:  Normocephalic, atraumatic. Moist oral mucosal membranes  Eyes:  Anicteric  Neck:  Supple  Lungs:   Clear to auscultation, normal effort  Heart:  S1S2 no rubs  Abdomen:   Soft, nontender, bowel sounds present  Extremities:  1+ peripheral edema.  Neurologic:  Awake, alert, following commands  Skin:  No lesions       Basic Metabolic Panel: Recent Labs  Lab 04/29/20 1009 04/29/20 1009 04/30/20 1911 05/01/20 0215 05/01/20 0514 05/02/20 0122 05/02/20 0553  NA 119*  --  113* 117* 118* 122* 126*  K 5.1  --  4.9  --   --  4.6 4.7  CL 85*  --  81*  --   --  91* 91*  CO2 23  --  22  --   --  23 24  GLUCOSE 82  --  111*  --   --  118* 112*  BUN 17  --  27*  --   --  25* 22  CREATININE 1.04  --  1.30*  --   --  1.04 1.01  CALCIUM 9.0   < > 8.5*  --   --  8.9 9.6   < > = values in this interval not displayed.    Liver Function Tests: Recent Labs  Lab 05/01/20 0514  ALBUMIN 2.7*   No results for input(s): LIPASE, AMYLASE in the last 168 hours. No results for input(s): AMMONIA in the last 168 hours.  CBC: Recent Labs  Lab 04/29/20 1009 04/30/20 1911  WBC 6.8 7.8  HGB 9.1* 9.0*  HCT 25.9* 25.7*  MCV 97 97.3  PLT 210 187     Cardiac Enzymes: No results for input(s): CKTOTAL, CKMB, CKMBINDEX, TROPONINI in the last 168 hours.  BNP: Invalid input(s): POCBNP  CBG: Recent Labs  Lab 05/02/20 0154  GLUCAP 113*    Microbiology: Results for orders placed or performed during the hospital encounter of 04/30/20  SARS Coronavirus 2 by RT PCR (hospital order, performed in [facility] hospital lab) Nasopharyngeal Nasopharyngeal Swab     Status: None   Collection Time: 05/01/20 12:15 AM   Specimen: Nasopharyngeal Swab  Result Value Ref Range Status   SARS Coronavirus 2 NEGATIVE NEGATIVE Final    Comment: (NOTE) SARS-CoV-2 target nucleic acids are NOT DETECTED.  The SARS-CoV-2 RNA is generally detectable in upper and lower respiratory specimens during the acute phase of infection. The lowest concentration of SARS-CoV-2 viral copies this assay can detect is 250 copies / mL. A negative result does not preclude SARS-CoV-2 infection and should not be used as the sole basis for treatment or other patient management decisions.  A negative result may occur with improper specimen collection /  handling, submission of specimen other than nasopharyngeal swab, presence of viral mutation(s) within the areas targeted by this assay, and inadequate number of viral copies (<250 copies / mL). A negative result must be combined with clinical observations, patient history, and epidemiological information.  Fact Sheet for Patients:   StrictlyIdeas.no  Fact Sheet for Healthcare Providers: BankingDealers.co.za  This test is not yet approved or  cleared by the [place] FDA and has been authorized for detection and/or diagnosis of SARS-CoV-2 by FDA under an Emergency Use Authorization (EUA).  This EUA will remain in effect (meaning this test can be used) for the duration of the COVID-19 declaration under Section 564(b)(1) of the Act, 21 U.S.C. section 360bbb-3(b)(1), unless  the authorization is terminated or revoked sooner.  Performed at [facility], [address] [place], [place] [address]   MRSA PCR Screening     Status: None   Collection Time: 05/02/20  2:02 AM   Specimen: Nasal Mucosa; Nasopharyngeal  Result Value Ref Range Status   MRSA by PCR NEGATIVE NEGATIVE Final    Comment:        The GeneXpert MRSA Assay (FDA approved for NASAL specimens only), is one component of a comprehensive MRSA colonization surveillance program. It is not intended to diagnose MRSA infection nor to guide or monitor treatment for MRSA infections. Performed at [facility], [address] [place], [place] [address]     Coagulation Studies: No results for input(s): LABPROT, INR in the last 72 hours.  Urinalysis: No results for input(s): COLORURINE, LABSPEC, PHURINE, GLUCOSEU, HGBUR, BILIRUBINUR, KETONESUR, PROTEINUR, UROBILINOGEN, NITRITE, LEUKOCYTESUR in the last 72 hours.  Invalid input(s): APPERANCEUR    Imaging: DG Chest 2 View  Result Date: 04/30/2020 CLINICAL DATA:  Weakness. EXAM: CHEST - 2 VIEW COMPARISON:  March 23, 2020 FINDINGS: Multiple sternal wires and vascular clips are seen. Very mild atelectasis is noted within the lateral aspects of the bilateral lung bases. There is a small right pleural effusion. No pneumothorax is identified. The heart size and mediastinal contours are within normal limits. The visualized skeletal structures are unremarkable. IMPRESSION: 1. Evidence of prior median sternotomy/CABG. 2. Very mild bibasilar atelectasis. 3. Small right pleural effusion. Electronically Signed   By: Virgina Norfolk M.D.   On: 04/30/2020 19:34   CT HEAD WO CONTRAST  Result Date: 05/01/2020 CLINICAL DATA:  Headache and multiple falls EXAM: CT HEAD WITHOUT CONTRAST TECHNIQUE: Contiguous axial images were obtained from the base of the skull through the vertex without intravenous contrast. COMPARISON:  None. FINDINGS: Brain: There  is no mass, hemorrhage or extra-axial collection. The appearance of the white matter is normal for the patient's age. There is generalized atrophy. Vascular: No abnormal hyperdensity of the major intracranial arteries or dural venous sinuses. No intracranial atherosclerosis. Skull: The visualized skull base, calvarium and extracranial soft tissues are normal. Sinuses/Orbits: No fluid levels or advanced mucosal thickening of the visualized paranasal sinuses. No mastoid or middle ear effusion. The orbits are normal. IMPRESSION: Generalized atrophy without acute intracranial abnormality. Electronically Signed   By: Ulyses Jarred M.D.   On: 05/01/2020 06:15     Medications:   . albumin human Stopped (05/02/20 0019)   . atorvastatin  80 mg Oral Daily  . Chlorhexidine Gluconate Cloth  6 each Topical Daily  . diclofenac Sodium  2 g Topical BID  . enoxaparin (LOVENOX) injection  40 mg Subcutaneous Q24H  . feeding supplement (ENSURE ENLIVE)  237 mL Oral BID BM  . ferrous  sulfate  325 mg Oral Daily  . hydrocerin   Topical BID  . lactobacillus acidophilus  2 tablet Oral TID  . magnesium citrate  1 Bottle Oral Once  . spironolactone  25 mg Oral Daily   pneumococcal 23 valent vaccine  Assessment/ Plan:  79 y.o. male with Hypertension, hyperlipidemia, atrial fibrillation, pulmonary hypertension, coronary disease status post CABG, diastolic CHF, anemia, CKD, , was admitted on 04/30/2020 with Hyponatremia [E87.1]   #Hyponatremia # LE edema  Plan: Patient now off of diuretics and 3% saline.  Serum sodium has come up to 126.  Hold off on additional 3% saline for now.  Maintain the patient on albumin for now.  Monitor serum sodium closely.   LOS: 2 Holdan Stucke 8/23/20218:23 AM

## 2020-05-02 NOTE — Evaluation (Signed)
Physical Therapy Evaluation Patient Details Name: Jordan Perkins MRN: [id number] DOB: [birth year] Today's Date: 05/02/2020   History of Present Illness  Pt is a 79 y.o. male with PMH of: HTN, hyperlipidemia, afib off Xarelto since 7/14 due to frequent falls, anemia, CKD III, possible dementia, CABG 2005, pulmonary HTN, and CAD. Per MD impression, pt currently presents with: hyponatremia, CAD, afib, HTN, CKD IIIa, CAD, and chronic diastolic CHF.  Clinical Impression  Pt was pleasant and motivated to participate during the session. Pt A&Ox4 this session; able to provide a good hx with occasional moments of confusion. Pt able to follow instructional cues well for supine and seated therex and required no physical assist. Pt globally demonstrated mod-I for bed mobility this session; additionally able to sit self up on flat bed surface and scoot self up to [facility]. Pt required CGA for sit-to-stand transfer to RW and demonstrated good eccentric and concentric control. Pt appeared mildly unsteady in standing w/ BUE support on RW, but able to shift weight through BLE w/ no LOB. Pt ambulated 134ft this session w/ RW and CGA; pt had a flexed trunk posture, mildly short bilateral step lengths, and decreased speed. No LOB noted throughout ambulation, but pt reported fatigue ~110ft and stated that was about as far as he typically walks at home. HR and SpO2 monitored and remained WFL throughout session. Pt will benefit from HHPT services upon discharge to safely address deficits listed in patient problem list for decreased caregiver assistance and eventual return to PLOF.     Follow Up Recommendations Home health PT;Supervision for mobility/OOB (pt reports currently recieving HHPT at home 2x week)    Equipment Recommendations  None recommended by PT    Recommendations for Other Services       Precautions / Restrictions Precautions Precautions: Fall Restrictions Weight Bearing Restrictions: No       Mobility  Bed Mobility Overal bed mobility: Needs Assistance Bed Mobility: Supine to Sit;Sit to Supine     Supine to sit: Modified independent (Device/Increase time) Sit to supine: Modified independent (Device/Increase time)   General bed mobility comments: inc time & effort but no need for physical assist: additionally able to sit from flat bed surface and scoot self up to [facility].  Transfers Overall transfer level: Needs assistance Equipment used: Rolling walker (2 wheeled) Transfers: Sit to/from Stand Sit to Stand: Min guard         General transfer comment: good eccentric and concentric control  Ambulation/Gait Ambulation/Gait assistance: Min guard Gait Distance (Feet): 150 Feet Assistive device: Rolling walker (2 wheeled) Gait Pattern/deviations: Step-through pattern;Decreased step length - right;Decreased step length - left;Trunk flexed Gait velocity: decreased   General Gait Details: Pt able to ambulate w/ a trunk flexed posture and mildly short bilateral step lengths  Stairs            Wheelchair Mobility    Modified Rankin (Stroke Patients Only)       Balance Overall balance assessment: Needs assistance Sitting-balance support: Feet unsupported;No upper extremity supported Sitting balance-Leahy Scale: Good     Standing balance support: Bilateral upper extremity supported;During functional activity Standing balance-Leahy Scale: Fair Standing balance comment: mild deficits observed: moderate reliance on RW in standing but able to shift weight through BLE                             Pertinent Vitals/Pain Pain Assessment: No/denies pain    Home Living Family/patient expects to  be discharged to:: Private residence Living Arrangements: Other relatives (brother) Available Help at Discharge: Family;Available 24 hours/day Type of Home: Mobile home Home Access: Stairs to enter Entrance Stairs-Rails: Right Entrance Stairs-Number of Steps:  4 Home Layout: One level Home Equipment: Walker - 2 wheels;Cane - single point;Bedside commode Additional Comments: Pt has tub shower but does not use because states cannot step in; sponge bathes.    Prior Function Level of Independence: Needs assistance         Comments: Pt states independent w/ bathing, dressing, and household mobility. Vocalizes he uses a SPC or 2WW at baseline for household ambulation w/o difficulty. Pt states does not ambulate community distances and uses facility buggy or WC     Hand Dominance        Extremity/Trunk Assessment   Upper Extremity Assessment Upper Extremity Assessment: Generalized weakness    Lower Extremity Assessment Lower Extremity Assessment: Generalized weakness       Communication   Communication: No difficulties  Cognition Arousal/Alertness: Awake/alert Behavior During Therapy: WFL for tasks assessed/performed Overall Cognitive Status: No family/caregiver present to determine baseline cognitive functioning                                 General Comments: Pt A&Ox4; generally able to provide a good hx this session w/ occasional moments of confusion.      General Comments      Exercises Total Joint Exercises Ankle Circles/Pumps: AROM;Strengthening;Both;10 reps Quad Sets: Strengthening;Both;10 reps Hip ABduction/ADduction: AROM;Strengthening;Both;10 reps Straight Leg Raises: AROM;Strengthening;Both;10 reps Long Arc Quad: AROM;Strengthening;Both;10 reps Knee Flexion: AROM;Strengthening;Both;10 reps Marching in Standing: AROM;Strengthening;Both;10 reps Other Exercises Other Exercises: Pt given education on importance of using 2WW (vs. SPC) at home for safety   Assessment/Plan    PT Assessment Patient needs continued PT services  PT Problem List Decreased strength;Decreased activity tolerance;Decreased balance;Decreased knowledge of use of DME;Decreased mobility       PT Treatment Interventions DME  instruction;Gait training;Stair training;Functional mobility training;Therapeutic activities;Patient/family education;Balance training;Therapeutic exercise    PT Goals (Current goals can be found in the Care Plan section)  Acute Rehab PT Goals Patient Stated Goal: get stronger and walk better PT Goal Formulation: With patient Time For Goal Achievement: 05/15/20 Potential to Achieve Goals: Good    Frequency Min 2X/week   Barriers to discharge        Co-evaluation               AM-PAC PT "6 Clicks" Mobility  Outcome Measure Help needed turning from your back to your side while in a flat bed without using bedrails?: None Help needed moving from lying on your back to sitting on the side of a flat bed without using bedrails?: None Help needed moving to and from a bed to a chair (including a wheelchair)?: None Help needed standing up from a chair using your arms (e.g., wheelchair or bedside chair)?: None Help needed to walk in hospital room?: A Little Help needed climbing 3-5 steps with a railing? : A Little 6 Click Score: 22    End of Session Equipment Utilized During Treatment: Gait belt Activity Tolerance: Patient tolerated treatment well Patient left: in bed;with call bell/phone within reach;Other (comment) (bilateral rails up as found in ICU) Nurse Communication: Mobility status PT Visit Diagnosis: Unsteadiness on feet (R26.81);Repeated falls (R29.6);Muscle weakness (generalized) (M62.81);History of falling (Z91.81);Difficulty in walking, not elsewhere classified (R26.2)    Time: 1638-4665 PT Time Calculation (  min) (ACUTE ONLY): 28 min   Charges:              Cleta Heatley SPT 05/02/20, 11:54 AM

## 2020-05-02 NOTE — Progress Notes (Signed)
PROGRESS NOTE    Patient: Jordan Perkins                            PCP: [name], MD                    DOB: 11-Feb-1941            DOA: 04/30/2020 [id number]             DOS: 05/02/2020, 11:37 AM   LOS: 2 days   Date of Service: The patient was seen and examined on 05/02/2020  Subjective:   The patient was seen and examined this morning, stable awake alert oriented. Remained in ICU, has been on hypertonic 3% saline Sodium level improved to 126  No issues overnight Complaining of right shoulder pain    Brief Narrative:   Coy Rochford is a 79 y.o. male with medical history significant for hypertension, hyperlipidemia,   A. fib off Xarelto since 7/14 due to frequent falls, anemia, pulmonary artery hypertension, coronary artery disease, CABG, diastolic congestive heart failure, anemia, CKD stage III and possible dementia, who presents to the ER on referral from his cardiologist for low sodium of 119.    Patient was recently hospitalized from 7/14-7/28 with anasarca and was aggressively diuresed.   ED Course: On arrival, patient was alert and oriented x3 with normal vitals.   sodium of 113.  Hemoglobin 9.0 which is around his baseline.  BNP 283. Troponin normal at 8. EKG showed rate controlled A. fib with no acute ST-T wave changes.  Chest x-ray with small right pleural effusion and very mild bibasilar atelectasis.  The emergency room provider spoke with on-call nephrologist who recommended 3% saline at 30 mils per hour x3 hours with reassessment of sodium at that time and continued management thereafter.  Hospitalist, consulted for admission.    Assessment & Plan:   Principal Problem:   Hyponatremia Active Problems:   CAD (coronary artery disease)   Atrial fibrillation (HCC)   Essential hypertension   Stage 3a chronic kidney disease   Coronary artery disease   Chronic diastolic CHF (congestive heart failure) (HCC)    Hyponatremia -Sodium  level on admission 113, 117, 118 >>> 126 now -Nephrology Dr. Candiss Norse following appreciate input -Was initiated on hypertonic 3% NS ->>> DC'd -We will continue to monitor sodium level closely  -Encouraging oral intake  -Suspecting hypervolemic given recent anasarca and continued fluid overload but patient did recently receive aggressive diuresis  -Follow urine and sodium osmolalities and urine sodium >>> consistent with Pres anemia, SIADH -Continue home meds pending med rec    Atrial fibrillation ([place]) -Rate 1 controlled, -Home meds reviewed not on any rate control medication -including beta-blockers  -Not on systemic anticoagulation due to history of falls, chronic anemia with history of positive FOBT per documentation from hospitalization in July 2021    Essential hypertension -Blood pressure remained stable --- still holding home medication of Lasix, lisinopril, Aldactone Due to hyponatremia,   Stage 3a chronic kidney disease -Monitoring BUN/creatinine closely... Creatinine on admission 2.67 >> 1.01 -Mild increase in creatinine but for the most part at baseline    Coronary artery disease -Denies any chest pain, for some reason patient is not on any beta-blockers, Patient is ACE inhibitor, on hold, continue statin -No complaints of chest pain, troponin negative and EKG nonacute    Chronic diastolic CHF (congestive heart failure) (HCC) -Remains euvolemic, Lasix,  lisinopril and Aldactone on hold -Remained stable today, monitor lower extremity edema -Patient with baseline shortness of breath, mild elevation in BNP and lower extremity edema but chest x-ray with mild pleural effusion -Continue to monitor closely and add back diuretics if necessary     Consultants: Nephrology    --------------------------------------------------------------------------------------------------------------------------------------------  DVT prophylaxis:  SCD/Compression stockings and Lovenox  SQ Code Status:   Code Status: Full Code Family Communication: No family member present at bedside, yesterday spoke to family who is determining POA--believes she was his niece The above findings and plan of care has been discussed with patient and his family in detail,  they expressed understanding and agreement of above. -Advance care planning has been discussed.   Admission status:    Status is: Inpatient  Remains inpatient appropriate because:Inpatient level of care appropriate due to severity of illness   Dispo: The patient is from: Home              Anticipated d/c is to: Home              Anticipated d/c date is: 3 days              Patient currently is not medically stable to d/c.        Procedures:   No admission procedures for hospital encounter.    Antimicrobials:  Anti-infectives (From admission, onward)   None       Medication:  . acidophilus  2 capsule Oral TID  . atorvastatin  80 mg Oral Daily  . Chlorhexidine Gluconate Cloth  6 each Topical Daily  . diclofenac Sodium  2 g Topical BID  . enoxaparin (LOVENOX) injection  40 mg Subcutaneous Q24H  . feeding supplement (ENSURE ENLIVE)  237 mL Oral BID BM  . ferrous sulfate  325 mg Oral Daily  . hydrocerin   Topical BID  . magnesium citrate  1 Bottle Oral Once  . spironolactone  25 mg Oral Daily       Objective:   Vitals:   05/02/20 0600 05/02/20 0700 05/02/20 0800 05/02/20 0900  BP: (!) 147/54 (!) 156/80 (!) 135/55 137/60  Pulse: 64 87 (!) 55 63  Resp:    15  Temp:    97.6 F (36.4 C)  TempSrc:    Oral  SpO2: 100% 97% 100% 100%  Weight:      Height:        Intake/Output Summary (Last 24 hours) at 05/02/2020 1137 Last data filed at 05/02/2020 0900 Gross per 24 hour  Intake 86.08 ml  Output 700 ml  Net -613.92 ml   Filed Weights   04/30/20 1900 05/02/20 0156  Weight: 80.7 kg 76.6 kg     Examination:      Physical Exam:   General:  Alert, oriented, cooperative, no distress;    HEENT:  Normocephalic, PERRL, otherwise with in Normal limits   Neuro:  CNII-XII intact. , normal motor and sensation, reflexes intact   Lungs:   Clear to auscultation BL, Respirations unlabored, no wheezes / crackles  Cardio:    S1/S2, RRR, No murmure, No Rubs or Gallops   Abdomen:   Soft, non-tender, bowel sounds active all four quadrants,  no guarding or peritoneal signs.  Muscular skeletal:   Right shoulder pain, generalized weaknesses Limited exam - in bed, able to move all 4 extremities, Normal strength,  2+ pulses,  symmetric, +1   pitting edema  Skin:  Dry, warm to touch, negative for any Rashes, No open  wounds  Wounds: Please see nursing documentation            ------------------------------------------------------------------------------------------------------------------------------------------    LABs:  CBC Latest Ref Rng & Units 04/30/2020 04/29/2020 04/14/2020  WBC 4.0 - 10.5 K/uL 7.8 6.8 6.2  Hemoglobin 13.0 - 17.0 g/dL 9.0(L) 9.1(L) 8.4(L)  Hematocrit 39 - 52 % 25.7(L) 25.9(L) 24.0(L)  Platelets 150 - 400 K/uL 187 210 231   CMP Latest Ref Rng & Units 05/02/2020 05/02/2020 05/01/2020  Glucose 70 - 99 mg/dL 112(H) 118(H) -  BUN 8 - 23 mg/dL 22 25(H) -  Creatinine 0.61 - 1.24 mg/dL 1.01 1.04 -  Sodium 135 - 145 mmol/L 126(L) 122(L) 118(LL)  Potassium 3.5 - 5.1 mmol/L 4.7 4.6 -  Chloride 98 - 111 mmol/L 91(L) 91(L) -  CO2 22 - 32 mmol/L 24 23 -  Calcium 8.9 - 10.3 mg/dL 9.6 8.9 -  Total Protein 6.0 - 8.5 g/dL - - -  Total Bilirubin 0.0 - 1.2 mg/dL - - -  Alkaline Phos 48 - 121 IU/L - - -  AST 0 - 40 IU/L - - -  ALT 0 - 44 IU/L - - -       Micro Results Recent Results (from the past 240 hour(s))  SARS Coronavirus 2 by RT PCR (hospital order, performed in [facility] hospital lab) Nasopharyngeal Nasopharyngeal Swab     Status: None   Collection Time: 05/01/20 12:15 AM   Specimen: Nasopharyngeal Swab  Result Value Ref Range Status   SARS Coronavirus 2  NEGATIVE NEGATIVE Final    Comment: (NOTE) SARS-CoV-2 target nucleic acids are NOT DETECTED.  The SARS-CoV-2 RNA is generally detectable in upper and lower respiratory specimens during the acute phase of infection. The lowest concentration of SARS-CoV-2 viral copies this assay can detect is 250 copies / mL. A negative result does not preclude SARS-CoV-2 infection and should not be used as the sole basis for treatment or other patient management decisions.  A negative result may occur with improper specimen collection / handling, submission of specimen other than nasopharyngeal swab, presence of viral mutation(s) within the areas targeted by this assay, and inadequate number of viral copies (<250 copies / mL). A negative result must be combined with clinical observations, patient history, and epidemiological information.  Fact Sheet for Patients:   StrictlyIdeas.no  Fact Sheet for Healthcare Providers: BankingDealers.co.za  This test is not yet approved or  cleared by the [place] FDA and has been authorized for detection and/or diagnosis of SARS-CoV-2 by FDA under an Emergency Use Authorization (EUA).  This EUA will remain in effect (meaning this test can be used) for the duration of the COVID-19 declaration under Section 564(b)(1) of the Act, 21 U.S.C. section 360bbb-3(b)(1), unless the authorization is terminated or revoked sooner.  Performed at [facility], [address] [place], [place] [address]   MRSA PCR Screening     Status: None   Collection Time: 05/02/20  2:02 AM   Specimen: Nasal Mucosa; Nasopharyngeal  Result Value Ref Range Status   MRSA by PCR NEGATIVE NEGATIVE Final    Comment:        The GeneXpert MRSA Assay (FDA approved for NASAL specimens only), is one component of a comprehensive MRSA colonization surveillance program. It is not intended to diagnose MRSA infection nor to guide or monitor  treatment for MRSA infections. Performed at [facility], [address], [place], [place] [address]     Radiology Reports DG Chest 2 View  Result Date: 04/30/2020 CLINICAL DATA:  Weakness. EXAM: CHEST - 2 VIEW COMPARISON:  March 23, 2020 FINDINGS: Multiple sternal wires and vascular clips are seen. Very mild atelectasis is noted within the lateral aspects of the bilateral lung bases. There is a small right pleural effusion. No pneumothorax is identified. The heart size and mediastinal contours are within normal limits. The visualized skeletal structures are unremarkable. IMPRESSION: 1. Evidence of prior median sternotomy/CABG. 2. Very mild bibasilar atelectasis. 3. Small right pleural effusion. Electronically Signed   By: Virgina Norfolk M.D.   On: 04/30/2020 19:34   CT HEAD WO CONTRAST  Result Date: 05/01/2020 CLINICAL DATA:  Headache and multiple falls EXAM: CT HEAD WITHOUT CONTRAST TECHNIQUE: Contiguous axial images were obtained from the base of the skull through the vertex without intravenous contrast. COMPARISON:  None. FINDINGS: Brain: There is no mass, hemorrhage or extra-axial collection. The appearance of the white matter is normal for the patient's age. There is generalized atrophy. Vascular: No abnormal hyperdensity of the major intracranial arteries or dural venous sinuses. No intracranial atherosclerosis. Skull: The visualized skull base, calvarium and extracranial soft tissues are normal. Sinuses/Orbits: No fluid levels or advanced mucosal thickening of the visualized paranasal sinuses. No mastoid or middle ear effusion. The orbits are normal. IMPRESSION: Generalized atrophy without acute intracranial abnormality. Electronically Signed   By: Ulyses Jarred M.D.   On: 05/01/2020 06:15    SIGNED: Deatra James, MD, FACP, FHM. [facility],  Pager (please use [email or web address] to [email or web address])  If 7PM-7AM, please contact night-coverage [email or web address]  [facility] 05/02/2020, 11:37 AM

## 2020-05-02 NOTE — ED Notes (Signed)
Attempted to call report; nurse was unavailable at this time

## 2020-05-02 NOTE — Progress Notes (Signed)
Initial Nutrition Assessment  DOCUMENTATION CODES:   Severe malnutrition in context of chronic illness  INTERVENTION:  Will downgrade diet to dysphagia 3 in setting of poor dentition.  Continue Ensure Enlive po BID, each supplement provides 350 kcal and 20 grams of protein.  Provide Magic cup TID with meals, each supplement provides 290 kcal and 9 grams of protein.  Encouraged adequate intake of calories and protein at meals. Discussed which foods contain protein.  NUTRITION DIAGNOSIS:   Severe Malnutrition related to chronic illness (CHF, CKD) as evidenced by severe fat depletion, severe muscle depletion.  GOAL:   Patient will meet greater than or equal to 90% of their needs  MONITOR:   PO intake, Supplement acceptance, Labs, Weight trends, I & O's  REASON FOR ASSESSMENT:   Consult Assessment of nutrition requirement/status  ASSESSMENT:   79 year old male with PMHx of CAD, HLD, HTN, CHF, A-fib now off Xarelto due to frequent falls, anemia, pulmonary artery hypertension, CKD stage III, possible dementia admitted with hyponatremia.   Met with patient at bedside. He reports his appetite has been decreased but he is still able to eat fairly well. He reports he attempts to eat 3 meals per day. He reports he cannot eat much meat due to poor dentition and difficulty chewing. He typically has beans and vegetables. RD will downgrade diet to dysphagia 3 in setting of poor dentition. Patient has been ordered for Ensure [facility] and reports he enjoys these. RD encouraged patient to eat adequate protein at meals and also to continue drinking an oral nutrition supplement similar to Ensure after discharge.  Patient reports he weights himself daily at home and he is usually around 178 lbs. According to chart he is now 76.6 kg (168.87 lbs). Patient is unsure when weight loss may have occurred. Weights in chart fluctuate significantly so difficult to trend.  Medications reviewed and include:  ferrous sulfate 325 mg daily, spironolactone 25 mg daily, albumin 12.5 grams BID IV.  Labs reviewed: Sodium 126, Chloride 91.  Discussed with RN.  NUTRITION - FOCUSED PHYSICAL EXAM:    Most Recent Value  Orbital Region Severe depletion  Upper Arm Region Severe depletion  Thoracic and Lumbar Region Moderate depletion  Buccal Region Severe depletion  Temple Region Severe depletion  Clavicle Bone Region Severe depletion  Clavicle and Acromion Bone Region Severe depletion  Scapular Bone Region Severe depletion  Dorsal Hand Severe depletion  Patellar Region Moderate depletion  Anterior Thigh Region Moderate depletion  Posterior Calf Region Severe depletion  Edema (RD Assessment) Mild  Hair Reviewed  Eyes Reviewed  Mouth Reviewed  [poor dentition]  Skin Reviewed  Nails Reviewed     Diet Order:   Diet Order            Diet Heart Room service appropriate? Yes; Fluid consistency: Thin  Diet effective now                EDUCATION NEEDS:   No education needs have been identified at this time  Skin:  Skin Assessment: Reviewed RN Assessment  Last BM:  05/02/2020 - small type 6  Height:   Ht Readings from Last 1 Encounters:  05/02/20 $RemoveB'5\' 11"'bGYOvJmi$  (1.803 m)   Weight:   Wt Readings from Last 1 Encounters:  05/02/20 76.6 kg   BMI:  Body mass index is 23.55 kg/m.  Estimated Nutritional Needs:   Kcal:  1800-2000  Protein:  90-100 grams  Fluid:  1.8 L/day  Jacklynn Barnacle, MS, RD, LDN  Pager number available on Amion

## 2020-05-03 LAB — BASIC METABOLIC PANEL
Anion gap: 8 (ref 5–15)
BUN: 27 mg/dL — ABNORMAL HIGH (ref 8–23)
CO2: 24 mmol/L (ref 22–32)
Calcium: 8.9 mg/dL (ref 8.9–10.3)
Chloride: 93 mmol/L — ABNORMAL LOW (ref 98–111)
Creatinine, Ser: 0.88 mg/dL (ref 0.61–1.24)
GFR calc Af Amer: >60 mL/min (ref >60)
GFR calc non Af Amer: >60 mL/min (ref >60)
Glucose, Bld: 122 mg/dL — ABNORMAL HIGH (ref 70–99)
Potassium: 4.5 mmol/L (ref 3.5–5.1)
Sodium: 125 mmol/L — ABNORMAL LOW (ref 135–145)

## 2020-05-03 LAB — SODIUM: Sodium: 130 mmol/L — ABNORMAL LOW (ref 135–145)

## 2020-05-03 MED ORDER — DOCUSATE SODIUM 100 MG PO CAPS
100.0000 mg | ORAL_CAPSULE | Freq: Two times a day (BID) | ORAL | Status: DC
  Administered 2020-05-03 – 2020-05-04 (×3): 100 mg via ORAL
  Filled 2020-05-03 (×3): qty 1

## 2020-05-03 MED ORDER — TOLVAPTAN 15 MG PO TABS
15.0000 mg | ORAL_TABLET | ORAL | Status: AC
  Administered 2020-05-03 – 2020-05-04 (×2): 15 mg via ORAL
  Filled 2020-05-03 (×2): qty 1

## 2020-05-03 MED ORDER — SPIRONOLACTONE 25 MG PO TABS
25.0000 mg | ORAL_TABLET | Freq: Every day | ORAL | Status: DC
  Administered 2020-05-04: 25 mg via ORAL
  Filled 2020-05-03: qty 1

## 2020-05-03 NOTE — Progress Notes (Addendum)
PROGRESS NOTE    Patient: Jordan Perkins                            PCP: [name], MD                    DOB: [birth year]            DOA: 04/30/2020 DJT:[id number]             DOS: 05/03/2020, 12:17 PM   LOS: 3 days   Date of Service: The patient was seen and examined on 05/03/2020  Subjective:   The patient was seen and examined, remained stable in no acute distress. Off of hypertonic 3% saline Sodium level remains at 126, 125,  Complaining of constipation, per nursing staff minimal bowel movements   Otherwise been no issues overnight, remained stable No other complaints aside from generalized weakness.    Brief Narrative:   Jordan Perkins is a 79 y.o. male with medical history significant for hypertension, hyperlipidemia,   A. fib off Xarelto since 7/14 due to frequent falls, anemia, pulmonary artery hypertension, coronary artery disease, CABG, diastolic congestive heart failure, anemia, CKD stage III and possible dementia, who presents to the ER on referral from his cardiologist for low sodium of 119.    Patient was recently hospitalized from 7/14-7/28 with anasarca and was aggressively diuresed.   ED Course: On arrival, patient was alert and oriented x3 with normal vitals.   sodium of 113.  Hemoglobin 9.0 which is around his baseline.  BNP 283. Troponin normal at 8. EKG showed rate controlled A. fib with no acute ST-T wave changes.  Chest x-ray with small right pleural effusion and very mild bibasilar atelectasis.  The emergency room provider spoke with on-call nephrologist who recommended 3% saline at 30 mils per hour x3 hours with reassessment of sodium at that time and continued management thereafter.  Hospitalist, consulted for admission.    Assessment & Plan:   Principal Problem:   Hyponatremia Active Problems:   CAD (coronary artery disease)   Atrial fibrillation (HCC)   Essential hypertension   Stage 3a chronic kidney disease   Coronary  artery disease   Chronic diastolic CHF (congestive heart failure) (HCC)    Hyponatremia -Sodium level on admission 113, 117, 118 >>> 126 , >> 125 now -Nephrology Dr. Candiss Norse following appreciate input -Status post initiating and treating with hypertonic 3% NS ->>> currently on hold -Today nephrology considering initiating Tolvaptan   -We will continue to monitor sodium level closely  -Encouraging oral intake  -Suspecting hypervolemic given recent anasarca and continued fluid overload but patient did recently receive aggressive diuresis -urine and sodium osmolalities and urine sodium >>> consistent with Pres anemia, SIADH    Atrial fibrillation (HCC) -Rate well controlled, -Mildly bradycardic heart rate 49-53 -Home meds reviewed not on any rate control medication -including beta-blockers  -Not on systemic anticoagulation due to history of falls, chronic anemia with history of positive FOBT per documentation from hospitalization in July 2021    Essential hypertension -Blood pressure remained stable --- still holding home medication of Lasix, lisinopril, Aldactone Due to hyponatremia,   Stage 3a chronic kidney disease Stable -Monitoring BUN/creatinine closely... Creatinine on admission 2.67 >> 1.01 -Mild increase in creatinine but for the most part at baseline    Coronary artery disease -Denies having any chest pain, not on a beta-blocker suspected due to  bradycardia , Patient is  ACE inhibitor, on hold, continue statin -No complaints of chest pain, troponin negative and EKG nonacute   Chronic diastolic CHF (congestive heart failure) (HCC) -Remained stable, euvolemic, Lasix, lisinopril and Aldactone on hold - monitor lower extremity edema -Patient with baseline shortness of breath, mild elevation in BNP and lower extremity edema but chest x-ray with mild pleural effusion -Continue to monitor closely and add back diuretics if necessary    Constipation -Adding stool  softener, as needed laxative  -Severe debility -Consulting PT/OT Anticipating needing home health PT OT .Marland Kitchen    Consultants: Nephrology    --------------------------------------------------------------------------------------------------------------------------------------------  DVT prophylaxis:  SCD/Compression stockings and Lovenox SQ Code Status:   Code Status: Full Code Family Communication: No family member present at bedside, yesterday spoke to family who is determining POA--believes she was his niece The above findings and plan of care has been discussed with patient and his family in detail,  they expressed understanding and agreement of above. -Advance care planning has been discussed.   Admission status:    Status is: Inpatient  Remains inpatient appropriate because:Inpatient level of care appropriate due to severity of illness   Dispo: The patient is from: Home              Anticipated d/c is to: Home with Home Health               Anticipated d/c date is: 1-2 days               Patient currently is not medically stable to d/c.     Procedures:   No admission procedures for hospital encounter.    Antimicrobials:  Anti-infectives (From admission, onward)   None       Medication:  . acidophilus  2 capsule Oral TID  . atorvastatin  80 mg Oral Daily  . Chlorhexidine Gluconate Cloth  6 each Topical Daily  . diclofenac Sodium  2 g Topical BID  . docusate sodium  100 mg Oral BID  . enoxaparin (LOVENOX) injection  40 mg Subcutaneous Q24H  . feeding supplement (ENSURE ENLIVE)  237 mL Oral BID BM  . ferrous sulfate  325 mg Oral Daily  . hydrocerin   Topical BID  . magnesium citrate  1 Bottle Oral Once  . spironolactone  25 mg Oral Daily  . tolvaptan  15 mg Oral Q24H       Objective:   Vitals:   05/03/20 0432 05/03/20 0525 05/03/20 0831 05/03/20 1116  BP: (!) 125/45 (!) 120/55 (!) 123/53 (!) 147/53  Pulse: 60 70 (!) 49 (!) 53  Resp:   19 19    Temp: 97.8 F (36.6 C) 98.5 F (36.9 C) 97.9 F (36.6 C) 97.6 F (36.4 C)  TempSrc: Oral Oral Oral   SpO2: 97% 96% 99% 100%  Weight:  69.9 kg    Height:        Intake/Output Summary (Last 24 hours) at 05/03/2020 1217 Last data filed at 05/03/2020 1116 Gross per 24 hour  Intake --  Output 1500 ml  Net -1500 ml   Filed Weights   05/02/20 0156 05/02/20 2217 05/03/20 0525  Weight: 76.6 kg 70 kg 69.9 kg     Examination:      Physical Exam:   General:  Alert, oriented, cooperative, no distress;   HEENT:  Normocephalic, PERRL, otherwise with in Normal limits   Neuro:  CNII-XII intact. , normal motor and sensation, reflexes intact   Lungs:   Clear to auscultation BL, Respirations  unlabored, no wheezes / crackles  Cardio:    S1/S2, RRR, No murmure, No Rubs or Gallops   Abdomen:   Soft, non-tender, bowel sounds active all four quadrants,  no guarding or peritoneal signs.  Muscular skeletal:   Severe generalized weakness noted, Limited exam - in bed, able to move all 4 extremities, Normal strength,  2+ pulses,  symmetric, No pitting edema  Skin:  Dry, warm to touch, negative for any Rashes, No open wounds  Wounds: Please see nursing documentation               ------------------------------------------------------------------------------------------------------------------------------------------    LABs:  CBC Latest Ref Rng & Units 04/30/2020 04/29/2020 04/14/2020  WBC 4.0 - 10.5 K/uL 7.8 6.8 6.2  Hemoglobin 13.0 - 17.0 g/dL 9.0(L) 9.1(L) 8.4(L)  Hematocrit 39 - 52 % 25.7(L) 25.9(L) 24.0(L)  Platelets 150 - 400 K/uL 187 210 231   CMP Latest Ref Rng & Units 05/03/2020 05/02/2020 05/02/2020  Glucose 70 - 99 mg/dL 122(H) 112(H) 118(H)  BUN 8 - 23 mg/dL 27(H) 22 25(H)  Creatinine 0.61 - 1.24 mg/dL 0.88 1.01 1.04  Sodium 135 - 145 mmol/L 125(L) 126(L) 122(L)  Potassium 3.5 - 5.1 mmol/L 4.5 4.7 4.6  Chloride 98 - 111 mmol/L 93(L) 91(L) 91(L)  CO2 22 - 32 mmol/L 24 24 23    Calcium 8.9 - 10.3 mg/dL 8.9 9.6 8.9  Total Protein 6.0 - 8.5 g/dL - - -  Total Bilirubin 0.0 - 1.2 mg/dL - - -  Alkaline Phos 48 - 121 IU/L - - -  AST 0 - 40 IU/L - - -  ALT 0 - 44 IU/L - - -       Micro Results Recent Results (from the past 240 hour(s))  SARS Coronavirus 2 by RT PCR (hospital order, performed in [facility] hospital lab) Nasopharyngeal Nasopharyngeal Swab     Status: None   Collection Time: 05/01/20 12:15 AM   Specimen: Nasopharyngeal Swab  Result Value Ref Range Status   SARS Coronavirus 2 NEGATIVE NEGATIVE Final    Comment: (NOTE) SARS-CoV-2 target nucleic acids are NOT DETECTED.  The SARS-CoV-2 RNA is generally detectable in upper and lower respiratory specimens during the acute phase of infection. The lowest concentration of SARS-CoV-2 viral copies this assay can detect is 250 copies / mL. A negative result does not preclude SARS-CoV-2 infection and should not be used as the sole basis for treatment or other patient management decisions.  A negative result may occur with improper specimen collection / handling, submission of specimen other than nasopharyngeal swab, presence of viral mutation(s) within the areas targeted by this assay, and inadequate number of viral copies (<250 copies / mL). A negative result must be combined with clinical observations, patient history, and epidemiological information.  Fact Sheet for Patients:   StrictlyIdeas.no  Fact Sheet for Healthcare Providers: BankingDealers.co.za  This test is not yet approved or  cleared by the [place] FDA and has been authorized for detection and/or diagnosis of SARS-CoV-2 by FDA under an Emergency Use Authorization (EUA).  This EUA will remain in effect (meaning this test can be used) for the duration of the COVID-19 declaration under Section 564(b)(1) of the Act, 21 U.S.C. section 360bbb-3(b)(1), unless the authorization is terminated  or revoked sooner.  Performed at [facility], [address] [place], [place] [address]   MRSA PCR Screening     Status: None   Collection Time: 05/02/20  2:02 AM   Specimen: Nasal Mucosa; Nasopharyngeal  Result Value Ref Range Status   MRSA by PCR NEGATIVE NEGATIVE Final    Comment:        The GeneXpert MRSA Assay (FDA approved for NASAL specimens only), is one component of a comprehensive MRSA colonization surveillance program. It is not intended to diagnose MRSA infection nor to guide or monitor treatment for MRSA infections. Performed at [facility], [address] [place], [place] [address]     Radiology Reports DG Chest 2 View  Result Date: 04/30/2020 CLINICAL DATA:  Weakness. EXAM: CHEST - 2 VIEW COMPARISON:  March 23, 2020 FINDINGS: Multiple sternal wires and vascular clips are seen. Very mild atelectasis is noted within the lateral aspects of the bilateral lung bases. There is a small right pleural effusion. No pneumothorax is identified. The heart size and mediastinal contours are within normal limits. The visualized skeletal structures are unremarkable. IMPRESSION: 1. Evidence of prior median sternotomy/CABG. 2. Very mild bibasilar atelectasis. 3. Small right pleural effusion. Electronically Signed   By: Virgina Norfolk M.D.   On: 04/30/2020 19:34   CT HEAD WO CONTRAST  Result Date: 05/01/2020 CLINICAL DATA:  Headache and multiple falls EXAM: CT HEAD WITHOUT CONTRAST TECHNIQUE: Contiguous axial images were obtained from the base of the skull through the vertex without intravenous contrast. COMPARISON:  None. FINDINGS: Brain: There is no mass, hemorrhage or extra-axial collection. The appearance of the white matter is normal for the patient's age. There is generalized atrophy. Vascular: No abnormal hyperdensity of the major intracranial arteries or dural venous sinuses. No intracranial atherosclerosis. Skull: The visualized skull base, calvarium and  extracranial soft tissues are normal. Sinuses/Orbits: No fluid levels or advanced mucosal thickening of the visualized paranasal sinuses. No mastoid or middle ear effusion. The orbits are normal. IMPRESSION: Generalized atrophy without acute intracranial abnormality. Electronically Signed   By: Ulyses Jarred M.D.   On: 05/01/2020 06:15    SIGNED: Deatra James, MD, FACP, FHM. [facility],  Pager (please use [email or web address] to page/text)  If 7PM-7AM, please contact night-coverage [email or web address] 05/03/2020, 12:17 PM

## 2020-05-03 NOTE — TOC Initial Note (Signed)
Transition of Care [facility]) - Initial/Assessment Note    Patient Details  Name: Jordan Perkins MRN: [id number] Date of Birth: 10/26/1940  [facility]) CM/SW Contact:    Victorino Dike, RN Phone Number: 05/03/2020, 2:32 PM  Clinical Narrative:                   Patient lives with his brother at home.  Has never been married or have children.  He has a niece [name] that helps care for him.  Patient and Niece interested in Medical POA.  Consult placed to Chaplain spoke with him about case.     Patient will be receiving [place] provded by [facility]:  PT, OT, RN and aide.    Expected Discharge Plan: [place] Barriers to Discharge: Continued Medical Work up   Patient Goals and CMS Choice     Choice offered to / list presented to : Patient  Expected Discharge Plan and Services Expected Discharge Plan: [place] In-house Referral: Chaplain Discharge Planning Services: CM Consult Post Acute Care Choice: NA Living arrangements for the past 2 months: Single Family Home                                      Prior Living Arrangements/Services Living arrangements for the past 2 months: Single Family Home Lives with:: Siblings Patient language and need for interpreter reviewed:: Yes Do you feel safe going back to the place where you live?: Yes      Need for Family Participation in Patient Care: Yes (Comment) Care giver support system in place?: Yes (comment) Current home services: DME (walker) Criminal Activity/Legal Involvement Pertinent to Current Situation/Hospitalization: No - Comment as needed  Activities of Daily Living Home Assistive Devices/Equipment: Cane (specify quad or straight), Walker (specify type) ADL Screening (condition at time of admission) Patient's cognitive ability adequate to safely complete daily activities?: Yes Is the patient deaf or have difficulty hearing?: No Does the patient have  difficulty seeing, even when wearing glasses/contacts?: No Does the patient have difficulty concentrating, remembering, or making decisions?: Yes Patient able to express need for assistance with ADLs?: Yes Does the patient have difficulty dressing or bathing?: No Independently performs ADLs?: Yes (appropriate for developmental age) Does the patient have difficulty walking or climbing stairs?: No Weakness of Legs: None Weakness of Arms/Hands: None  Permission Sought/Granted                  Emotional Assessment         Alcohol / Substance Use: Not Applicable Psych Involvement: No (comment)  Admission diagnosis:  Hyponatremia [E87.1] Generalized weakness [R53.1] Patient Active Problem List   Diagnosis Date Noted  . Fecal occult blood test positive 04/15/2020  . Chronic diastolic CHF (congestive heart failure) ([place]) 03/23/2020  . Coronary artery disease   . Fall   . Hypokalemia   . Bradycardia   . Stage 3a chronic kidney disease 07/12/2019  . Prediabetes 06/12/2019  . Macrocytic anemia 03/10/2019  . Pulmonary hypertension, unspecified ([place])   . Pleural effusion   . CHF (congestive heart failure) ([place]) 02/16/2019  . PAD (peripheral artery disease) ([place]) 01/02/2019  . Bilateral carotid artery disease ([place]) 01/02/2019  . Memory difficulty 11/05/2016  . Seborrheic keratoses 11/05/2016  . Hyponatremia 10/29/2016  . Encounter for anticoagulation discussion and counseling 07/21/2014  . CAD (coronary artery  disease) 06/23/2013  . 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  . Atrial fibrillation ([place]) 06/23/2013  . Hyperlipidemia 06/23/2013  . Essential hypertension 06/23/2013  . Gallstone pancreatitis 06/23/2013   PCP:  [name], MD Pharmacy:   [id number] [place], [address] [place] [address] Phone: [phone] Fax: [phone]     Social Determinants of Health (SDOH) Interventions    Readmission Risk  Interventions No flowsheet data found.

## 2020-05-03 NOTE — Progress Notes (Signed)
[facility]  ROUNDING NOTE   Subjective:  Patient resting in bed on arrival to the room, in no acute distress. Sodium levels still staying [place] today.    Objective:  Vital signs in last 24 hours:  Temp:  [97.6 F (36.4 C)-98.7 F (37.1 C)] 97.6 F (36.4 C) (08/24 1116) Pulse Rate:  [49-74] 53 (08/24 1116) Resp:  [16-20] 19 (08/24 1116) BP: (120-149)/(45-57) 147/53 (08/24 1116) SpO2:  [96 %-100 %] 100 % (08/24 1116) Weight:  [69.9 kg-70 kg] 69.9 kg (08/24 0525)  Weight change: -6.61 kg Filed Weights   05/02/20 0156 05/02/20 2217 05/03/20 0525  Weight: 76.6 kg 70 kg 69.9 kg    Intake/Output: I/O last 3 completed shifts: In: 86.1 [IV Piggyback:86.1] Out: 1900 [Urine:1900]   Intake/Output this shift:  Total I/O In: -  Out: 300 [Urine:300]  Physical Exam: General:  Resting in bed, no acute distress  Head:  Normocephalic, atraumatic. Moist oral mucosal membranes  Eyes:  Sclerae and conjunctivae clear  Neck:  Supple  Lungs:   Lungs clear, normal effort  Heart:  Regular rate and rhythm  Abdomen:   Soft, nontender  Extremities:  No peripheral edema.  Neurologic:  Awake, alert,answers questions appropriately  Skin:  No acute lesions       Basic Metabolic Panel: Recent Labs  Lab 04/29/20 1009 04/29/20 1009 04/30/20 1911 04/30/20 1911 05/01/20 0215 05/01/20 0514 05/02/20 0122 05/02/20 0553 05/03/20 0530  NA 119*  --  113*   < > 117* 118* 122* 126* 125*  K 5.1  --  4.9  --   --   --  4.6 4.7 4.5  CL 85*  --  81*  --   --   --  91* 91* 93*  CO2 23  --  22  --   --   --  23 24 24   GLUCOSE 82  --  111*  --   --   --  118* 112* 122*  BUN 17  --  27*  --   --   --  25* 22 27*  CREATININE 1.04  --  1.30*  --   --   --  1.04 1.01 0.88  CALCIUM 9.0   < > 8.5*   < >  --   --  8.9 9.6 8.9   < > = values in this interval not displayed.    Liver Function Tests: Recent Labs  Lab 05/01/20 0514  ALBUMIN 2.7*   No results for input(s): LIPASE, AMYLASE  in the last 168 hours. No results for input(s): AMMONIA in the last 168 hours.  CBC: Recent Labs  Lab 04/29/20 1009 04/30/20 1911  WBC 6.8 7.8  HGB 9.1* 9.0*  HCT 25.9* 25.7*  MCV 97 97.3  PLT 210 187    Cardiac Enzymes: No results for input(s): CKTOTAL, CKMB, CKMBINDEX, TROPONINI in the last 168 hours.  BNP: Invalid input(s): POCBNP  CBG: Recent Labs  Lab 05/02/20 0154  GLUCAP 113*    Microbiology: Results for orders placed or performed during the hospital encounter of 04/30/20  SARS Coronavirus 2 by RT PCR (hospital order, performed in [facility] hospital lab) Nasopharyngeal Nasopharyngeal Swab     Status: None   Collection Time: 05/01/20 12:15 AM   Specimen: Nasopharyngeal Swab  Result Value Ref Range Status   SARS Coronavirus 2 NEGATIVE NEGATIVE Final    Comment: (NOTE) SARS-CoV-2 target nucleic acids are NOT DETECTED.  The SARS-CoV-2 RNA is generally detectable in upper and  lower respiratory specimens during the acute phase of infection. The lowest concentration of SARS-CoV-2 viral copies this assay can detect is 250 copies / mL. A negative result does not preclude SARS-CoV-2 infection and should not be used as the sole basis for treatment or other patient management decisions.  A negative result may occur with improper specimen collection / handling, submission of specimen other than nasopharyngeal swab, presence of viral mutation(s) within the areas targeted by this assay, and inadequate number of viral copies (<250 copies / mL). A negative result must be combined with clinical observations, patient history, and epidemiological information.  Fact Sheet for Patients:   StrictlyIdeas.no  Fact Sheet for Healthcare Providers: BankingDealers.co.za  This test is not yet approved or  cleared by the [place] FDA and has been authorized for detection and/or diagnosis of SARS-CoV-2 by FDA under an Emergency Use  Authorization (EUA).  This EUA will remain in effect (meaning this test can be used) for the duration of the COVID-19 declaration under Section 564(b)(1) of the Act, 21 U.S.C. section 360bbb-3(b)(1), unless the authorization is terminated or revoked sooner.  Performed at [facility], [address] [place], [place] [address]   MRSA PCR Screening     Status: None   Collection Time: 05/02/20  2:02 AM   Specimen: Nasal Mucosa; Nasopharyngeal  Result Value Ref Range Status   MRSA by PCR NEGATIVE NEGATIVE Final    Comment:        The GeneXpert MRSA Assay (FDA approved for NASAL specimens only), is one component of a comprehensive MRSA colonization surveillance program. It is not intended to diagnose MRSA infection nor to guide or monitor treatment for MRSA infections. Performed at [facility], [address] [place], [place] [address]     Coagulation Studies: No results for input(s): LABPROT, INR in the last 72 hours.  Urinalysis: No results for input(s): COLORURINE, LABSPEC, PHURINE, GLUCOSEU, HGBUR, BILIRUBINUR, KETONESUR, PROTEINUR, UROBILINOGEN, NITRITE, LEUKOCYTESUR in the last 72 hours.  Invalid input(s): APPERANCEUR    Imaging: No results found.   Medications:   . albumin human 12.5 g (05/03/20 0949)   . acidophilus  2 capsule Oral TID  . atorvastatin  80 mg Oral Daily  . Chlorhexidine Gluconate Cloth  6 each Topical Daily  . diclofenac Sodium  2 g Topical BID  . docusate sodium  100 mg Oral BID  . enoxaparin (LOVENOX) injection  40 mg Subcutaneous Q24H  . feeding supplement (ENSURE ENLIVE)  237 mL Oral BID BM  . ferrous sulfate  325 mg Oral Daily  . hydrocerin   Topical BID  . magnesium citrate  1 Bottle Oral Once  . [START ON 05/04/2020] spironolactone  25 mg Oral Daily  . tolvaptan  15 mg Oral Q24H   pneumococcal 23 valent vaccine  Assessment/ Plan:  79 y.o. male with Hypertension, hyperlipidemia, atrial fibrillation, pulmonary  hypertension, coronary disease status post CABG, diastolic CHF, anemia, CKD, was admitted on 04/30/2020 with Hyponatremia [E87.1]   #Hyponatremia #Peripheral edema Patient is off of 3% Saline.  Serum sodium up to 126.  Hold off on additional 3% saline for now.  Maintain the patient on albumin for now. Added Tolvaptan today  Monitor serum sodium closely.     LOS: 3 Jordan Perkins 8/24/20211:49 PM

## 2020-05-03 NOTE — Care Management Important Message (Signed)
Important Message  Patient Details  Name: Jordan Perkins MRN: [id number] Date of Birth: [birth year]   Medicare Important Message Given:  Yes  Initial Medicare IM given by Patient Access Associate on 05/02/2020 at 10:29am.    [name] 05/03/2020, 9:09 AM

## 2020-05-03 NOTE — Progress Notes (Signed)
Physical Therapy Treatment Patient Details Name: Jordan Perkins MRN: [id number] DOB: [birth year] Today's Date: 05/03/2020    History of Present Illness Pt is a 79 y.o. male with PMH of: HTN, hyperlipidemia, afib off Xarelto since 7/14 due to frequent falls, anemia, CKD III, possible dementia, CABG 2005, pulmonary HTN, and CAD. Per MD impression, pt currently presents with: hyponatremia, CAD, afib, HTN, CKD IIIa, CAD, and chronic diastolic CHF.    PT Comments    Patient received in bed sleeping, easily roused and agrees to PT session. Patient reports no particular difficulties this day. He performed bed mobility with mod independence, transfers with supervision and is able to ambulate 300 feet with RW and min guard/supervision. He will continue to benefit from skilled PT while here to improve functional mobility and activity tolerance for return home.        Follow Up Recommendations  Home health PT;Supervision - Intermittent     Equipment Recommendations  None recommended by PT    Recommendations for Other Services       Precautions / Restrictions Precautions Precautions: Fall Restrictions Weight Bearing Restrictions: No    Mobility  Bed Mobility Overal bed mobility: Modified Independent Bed Mobility: Supine to Sit;Sit to Supine     Supine to sit: Modified independent (Device/Increase time) Sit to supine: Modified independent (Device/Increase time)      Transfers Overall transfer level: Needs assistance Equipment used: Rolling walker (2 wheeled) Transfers: Sit to/from Stand Sit to Stand: Min guard            Ambulation/Gait Ambulation/Gait assistance: Counsellor (Feet): 300 Feet Assistive device: Rolling walker (2 wheeled) Gait Pattern/deviations: Step-through pattern;Decreased step length - right;Decreased step length - left;Trunk flexed     General Gait Details: Patient without significant fatigue or difficulty with mobility   Stairs              Wheelchair Mobility    Modified Rankin (Stroke Patients Only)       Balance Overall balance assessment: Needs assistance Sitting-balance support: No upper extremity supported;Feet supported Sitting balance-Leahy Scale: Good     Standing balance support: Bilateral upper extremity supported;During functional activity Standing balance-Leahy Scale: Good Standing balance comment: able to take a few steps without ad.                            Cognition Arousal/Alertness: Awake/alert Behavior During Therapy: WFL for tasks assessed/performed Overall Cognitive Status: Within Functional Limits for tasks assessed                                        Exercises      General Comments        Pertinent Vitals/Pain Pain Assessment: No/denies pain    Home Living                      Prior Function            PT Goals (current goals can now be found in the care plan section) Acute Rehab PT Goals Patient Stated Goal: get stronger and walk better PT Goal Formulation: With patient Time For Goal Achievement: 05/15/20 Potential to Achieve Goals: Good Progress towards PT goals: Progressing toward goals    Frequency    Min 2X/week      PT Plan Current plan remains appropriate  Co-evaluation              AM-PAC PT "6 Clicks" Mobility   Outcome Measure  Help needed turning from your back to your side while in a flat bed without using bedrails?: None Help needed moving from lying on your back to sitting on the side of a flat bed without using bedrails?: None Help needed moving to and from a bed to a chair (including a wheelchair)?: None Help needed standing up from a chair using your arms (e.g., wheelchair or bedside chair)?: None Help needed to walk in hospital room?: None Help needed climbing 3-5 steps with a railing? : A Little 6 Click Score: 23    End of Session Equipment Utilized During Treatment: Gait  belt Activity Tolerance: Patient tolerated treatment well Patient left: in bed;with call bell/phone within reach Nurse Communication: Mobility status PT Visit Diagnosis: Repeated falls (R29.6);History of falling (Z91.81);Difficulty in walking, not elsewhere classified (R26.2)     Time: 1100-1111 PT Time Calculation (min) (ACUTE ONLY): 11 min  Charges:  $Gait Training: 8-22 mins                     [employer], PT, GCS 05/03/20,12:44 PM

## 2020-05-03 NOTE — Progress Notes (Signed)
Patient c/o straining while trying to have bowel movement. Discouraged him ; by doing so could cause vagal response. Will pass on to day shift to offer stool softener during morning med pass.

## 2020-05-03 NOTE — Progress Notes (Signed)
Jordan Perkins visited pt. per OR for AD education/completion from case management team; pt. lying in bed when Jordan Perkins arrived.  Pt. expressed no immediate spiritual/emotional needs; Pt. lives w/his brother and this seems to have been the case for a long time.  Niece comes to help them w/various household needs.  [place] asked if pt. was interested in completing/discussing MPOA paperwork --> pt. said his niece will be his decisionmaker, and at [facility] recommendation, Pt. completed AD to indicate this officially.  Niece Jordan Perkins is MPOA; pt. does not want life-prolonging treatment in situations of advanced dementia/thinking loss and illness likely leading to imminent death.  Pt. defers to MPOA if she differes in plan from Living Will.  AD notarized and placed in paper chart; copy scanned to [facility]  No further needs at this time.

## 2020-05-04 ENCOUNTER — Other Ambulatory Visit: Payer: Self-pay | Admitting: *Deleted

## 2020-05-04 DIAGNOSIS — Z7189 Other specified counseling: Secondary | ICD-10-CM

## 2020-05-04 DIAGNOSIS — I480 Paroxysmal atrial fibrillation: Secondary | ICD-10-CM

## 2020-05-04 DIAGNOSIS — Z515 Encounter for palliative care: Secondary | ICD-10-CM

## 2020-05-04 DIAGNOSIS — Z66 Do not resuscitate: Secondary | ICD-10-CM

## 2020-05-04 DIAGNOSIS — I10 Essential (primary) hypertension: Secondary | ICD-10-CM

## 2020-05-04 DIAGNOSIS — E43 Unspecified severe protein-calorie malnutrition: Secondary | ICD-10-CM | POA: Insufficient documentation

## 2020-05-04 LAB — BASIC METABOLIC PANEL
Anion gap: 7 (ref 5–15)
BUN: 34 mg/dL — ABNORMAL HIGH (ref 8–23)
CO2: 27 mmol/L (ref 22–32)
Calcium: 9.1 mg/dL (ref 8.9–10.3)
Chloride: 96 mmol/L — ABNORMAL LOW (ref 98–111)
Creatinine, Ser: 0.86 mg/dL (ref 0.61–1.24)
GFR calc Af Amer: >60 mL/min (ref >60)
GFR calc non Af Amer: >60 mL/min (ref >60)
Glucose, Bld: 131 mg/dL — ABNORMAL HIGH (ref 70–99)
Potassium: 5.3 mmol/L — ABNORMAL HIGH (ref 3.5–5.1)
Sodium: 130 mmol/L — ABNORMAL LOW (ref 135–145)

## 2020-05-04 LAB — POTASSIUM: Potassium: 4.3 mmol/L (ref 3.5–5.1)

## 2020-05-04 MED ORDER — BISACODYL 5 MG PO TBEC: 10.0000 mg | DELAYED_RELEASE_TABLET | Freq: Every day | ORAL | Status: DC | PRN

## 2020-05-04 MED ORDER — DOCUSATE SODIUM 100 MG PO CAPS
200.0000 mg | ORAL_CAPSULE | Freq: Two times a day (BID) | ORAL | Status: DC
  Administered 2020-05-04 – 2020-05-05 (×2): 200 mg via ORAL
  Filled 2020-05-04 (×2): qty 2

## 2020-05-04 NOTE — Consult Note (Signed)
Consultation Note Date: 05/04/2020   Patient Name: Jordan Perkins  DOB: [birth year]  MRN: [id number]  Age / Sex: 79 y.o., male  PCP: [name], MD Referring Physician: Wyvonnia Dusky, MD  Reason for Consultation: Establishing goals of care  HPI/Patient Profile: 79 y.o. male  with past medical history of HTN, HLD, a fib, falls, PAH, CAD, CHF, and CKD admitted on 04/30/2020 with hyponatremia. He was recently admitted 7/14-7/28 with anasarca and was aggressively diuresed. This admission, patient found to have sodium of 113. Sodium now up to 130. PMT consulted to discuss [place].    Clinical Assessment and Goals of Care: I have reviewed medical records including EPIC notes, labs and imaging, received report from RN, assessed the patient and then met with him to discuss diagnosis prognosis, GOC, EOL wishes, disposition and options.  I introduced Palliative Medicine as specialized medical care for people living with serious illness. It focuses on providing relief from the symptoms and stress of a serious illness. The goal is to improve quality of life for both the patient and the family.  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 He is not married; does not have kids.   He tells me he lives with his brother. He tells me he uses a walker or cane. He tells me he is able to independently care for himself. He reports a good appetite at home.    We discussed patient's current illness and what it means in the larger context of patient's on-going co-morbidities. I attempted to elicit values and goals of care important to the patient.  Patient shares desire for continued full scope medical care. Encouraged patient to consider DNR/DNI status understanding evidenced based poor outcomes in similar hospitalized patients, as the cause of the arrest is likely associated with chronic/terminal disease rather than a reversible acute cardio-pulmonary event. He  agrees that DNR is appropriate and in line with his wishes.   Discussed with patient the importance of continued conversation with family and the medical providers regarding overall plan of care and treatment options, ensuring decisions are within the context of the patient's values and GOCs.    I also called patient's documented HCPOA - [name] - she verifies above and agrees to DNR status.   Palliative Care services outpatient were explained and offered. Patient is agreeable to outpatient palliative care.   Questions and concerns were addressed. The family was encouraged to call with questions or concerns.   Primary Decision Maker PATIENT    SUMMARY OF RECOMMENDATIONS   - code status changed to DNR per patient's wishes - palliative to follow up at home - liaison notified  Code Status/Advance Care Planning:  DNR  Prognosis:   Unable to determine  Discharge Planning: Home with Home Health and palliative     Primary Diagnoses: Present on Admission: . Hyponatremia . CAD (coronary artery disease) . Atrial fibrillation ([place]) . Essential hypertension . Stage 3a chronic kidney disease . Coronary artery disease   I have reviewed the medical record, interviewed the patient and family, and examined the patient. The following aspects are pertinent.  Past Medical History:  Diagnosis Date  . (HFpEF) heart failure with preserved ejection fraction ([place])   . Coronary artery disease   . Hyperlipidemia   . Hypertension   . PAH (pulmonary artery hypertension) ([place])   . Permanent atrial fibrillation ([place])   . Pleural effusion    Social History   Socioeconomic History  . Marital status:  Single    Spouse name: Not on file  . Number of children: Not on file  . Years of education: Not on file  . Highest education level: Not on file  Occupational History  . Not on file  Tobacco Use  . Smoking status: Never Smoker  . Smokeless tobacco: Never Used  Substance and Sexual  Activity  . Alcohol use: No  . Drug use: No  . Sexual activity: Not on file  Other Topics Concern  . Not on file  Social History Narrative  . Not on file   Social Determinants of Health   Financial Resource Strain: Low Risk   . Difficulty of Paying Living Expenses: Not hard at all  Food Insecurity: No Food Insecurity  . Worried About Charity fundraiser in the Last Year: Never true  . Ran Out of Food in the Last Year: Never true  Transportation Needs: Unmet Transportation Needs  . Lack of Transportation (Medical): Yes  . Lack of Transportation (Non-Medical): No  Physical Activity: Unknown  . Days of Exercise per Week: 0 days  . Minutes of Exercise per Session: Not on file  Stress: No Stress Concern Present  . Feeling of Stress : Not at all  Social Connections:   . Frequency of Communication with Friends and Family: Not on file  . Frequency of Social Gatherings with Friends and Family: Not on file  . Attends Religious Services: Not on file  . Active Member of Clubs or Organizations: Not on file  . Attends Archivist Meetings: Not on file  . Marital Status: Not on file   Family History  Problem Relation Age of Onset  . Heart disease Father   . Heart disease Paternal Uncle   . Heart attack Brother    Scheduled Meds: . acidophilus  2 capsule Oral TID  . atorvastatin  80 mg Oral Daily  . Chlorhexidine Gluconate Cloth  6 each Topical Daily  . diclofenac Sodium  2 g Topical BID  . docusate sodium  200 mg Oral BID  . enoxaparin (LOVENOX) injection  40 mg Subcutaneous Q24H  . feeding supplement (ENSURE ENLIVE)  237 mL Oral BID BM  . ferrous sulfate  325 mg Oral Daily  . hydrocerin   Topical BID  . magnesium citrate  1 Bottle Oral Once   Continuous Infusions: . albumin human 12.5 g (05/04/20 0905)   PRN Meds:.bisacodyl, pneumococcal 23 valent vaccine Allergies  Allergen Reactions  . Sulfa Antibiotics Rash    Rash/hives   . Other Rash   Review of Systems    Constitutional: Positive for activity change and appetite change.  Respiratory: Negative for shortness of breath.   Cardiovascular: Positive for leg swelling. Negative for chest pain.  Neurological: Positive for weakness.    Physical Exam Constitutional:      General: He is not in acute distress. Pulmonary:     Effort: Pulmonary effort is normal. No respiratory distress.  Skin:    General: Skin is warm and dry.  Neurological:     Mental Status: He is alert and oriented to person, place, and time.     Vital Signs: BP (!) 131/52 (BP Location: Left Arm)   Pulse 61   Temp 98.6 F (37 C) (Oral)   Resp 18   Ht $R'5\' 11"'hL$  (1.803 m)   Wt 69.9 kg   SpO2 96%   BMI 21.48 kg/m  Pain Scale: 0-10   Pain Score: 0-No pain   SpO2: SpO2:  96 % O2 Device:SpO2: 96 % O2 Flow Rate: .   IO: Intake/output summary:   Intake/Output Summary (Last 24 hours) at 05/04/2020 1253 Last data filed at 05/04/2020 2158 Gross per 24 hour  Intake --  Output 1000 ml  Net -1000 ml    LBM: Last BM Date: 05/02/20 Baseline Weight: Weight: 80.7 kg Most recent weight: Weight: 69.9 kg     Palliative Assessment/Data: PPS 50%    Time Total: 50 minutes Greater than 50%  of this time was spent counseling and coordinating care related to the above assessment and plan.  Juel Burrow, DNP, AGNP-C Palliative Medicine Team [phone] Pager: [phone]

## 2020-05-04 NOTE — Progress Notes (Signed)
[facility]  ROUNDING NOTE   Subjective:  Patient awake and alert, in no acute distress today.He denies fatigue, SOB, nausea, or vomiting.  Objective:  Vital signs in last 24 hours:  Temp:  [97.7 F (36.5 C)-98.6 F (37 C)] 98.6 F (37 C) (08/25 0621) Pulse Rate:  [61-73] 61 (08/25 0621) Resp:  [18] 18 (08/25 0621) BP: (131-153)/(52-59) 131/52 (08/25 0621) SpO2:  [96 %-100 %] 96 % (08/25 0621)  Weight change:  Filed Weights   05/02/20 0156 05/02/20 2217 05/03/20 0525  Weight: 76.6 kg 70 kg 69.9 kg    Intake/Output: I/O last 3 completed shifts: In: -  Out: 2500 [Urine:2500]   Intake/Output this shift:  No intake/output data recorded.  Physical Exam: General:  No acute distress  Head:  Moist oral mucosal membranes  Eyes:  Anicteric  Neck: Trachea at the midline  Lungs:   Normal and  symmetrical respirations,Lungs clear  Heart:  S1S2,Regular, no rub or gallop  Abdomen:   Soft, nontender  Extremities:  Trace peripheral edema.  Neurologic:  Awake, alert,oriented  Skin:  No acute rashes or lesions       Basic Metabolic Panel: Recent Labs  Lab 04/30/20 1911 05/01/20 0215 05/02/20 0122 05/02/20 0122 05/02/20 0553 05/03/20 0530 05/03/20 1704 05/04/20 0022  NA 113*   < > 122*  --  126* 125* 130* 130*  K 4.9  --  4.6  --  4.7 4.5  --  5.3*  CL 81*  --  91*  --  91* 93*  --  96*  CO2 22  --  23  --  24 24  --  27  GLUCOSE 111*  --  118*  --  112* 122*  --  131*  BUN 27*  --  25*  --  22 27*  --  34*  CREATININE 1.30*  --  1.04  --  1.01 0.88  --  0.86  CALCIUM 8.5*  --  8.9   < > 9.6 8.9  --  9.1   < > = values in this interval not displayed.    Liver Function Tests: Recent Labs  Lab 05/01/20 0514  ALBUMIN 2.7*   No results for input(s): LIPASE, AMYLASE in the last 168 hours. No results for input(s): AMMONIA in the last 168 hours.  CBC: Recent Labs  Lab 04/29/20 1009 04/30/20 1911  WBC 6.8 7.8  HGB 9.1* 9.0*  HCT 25.9* 25.7*  MCV  97 97.3  PLT 210 187    Cardiac Enzymes: No results for input(s): CKTOTAL, CKMB, CKMBINDEX, TROPONINI in the last 168 hours.  BNP: Invalid input(s): POCBNP  CBG: Recent Labs  Lab 05/02/20 0154  GLUCAP 113*    Microbiology: Results for orders placed or performed during the hospital encounter of 04/30/20  SARS Coronavirus 2 by RT PCR (hospital order, performed in [facility] hospital lab) Nasopharyngeal Nasopharyngeal Swab     Status: None   Collection Time: 05/01/20 12:15 AM   Specimen: Nasopharyngeal Swab  Result Value Ref Range Status   SARS Coronavirus 2 NEGATIVE NEGATIVE Final    Comment: (NOTE) SARS-CoV-2 target nucleic acids are NOT DETECTED.  The SARS-CoV-2 RNA is generally detectable in upper and lower respiratory specimens during the acute phase of infection. The lowest concentration of SARS-CoV-2 viral copies this assay can detect is 250 copies / mL. A negative result does not preclude SARS-CoV-2 infection and should not be used as the sole basis for treatment or other patient management decisions.  A negative result may occur with improper specimen collection / handling, submission of specimen other than nasopharyngeal swab, presence of viral mutation(s) within the areas targeted by this assay, and inadequate number of viral copies (<250 copies / mL). A negative result must be combined with clinical observations, patient history, and epidemiological information.  Fact Sheet for Patients:   StrictlyIdeas.no  Fact Sheet for Healthcare Providers: BankingDealers.co.za  This test is not yet approved or  cleared by the [place] FDA and has been authorized for detection and/or diagnosis of SARS-CoV-2 by FDA under an Emergency Use Authorization (EUA).  This EUA will remain in effect (meaning this test can be used) for the duration of the COVID-19 declaration under Section 564(b)(1) of the Act, 21 U.S.C. section  360bbb-3(b)(1), unless the authorization is terminated or revoked sooner.  Performed at [facility], [address] [place], [place] [address]   MRSA PCR Screening     Status: None   Collection Time: 05/02/20  2:02 AM   Specimen: Nasal Mucosa; Nasopharyngeal  Result Value Ref Range Status   MRSA by PCR NEGATIVE NEGATIVE Final    Comment:        The GeneXpert MRSA Assay (FDA approved for NASAL specimens only), is one component of a comprehensive MRSA colonization surveillance program. It is not intended to diagnose MRSA infection nor to guide or monitor treatment for MRSA infections. Performed at [facility], [address] [place], [place] [address]     Coagulation Studies: No results for input(s): LABPROT, INR in the last 72 hours.  Urinalysis: No results for input(s): COLORURINE, LABSPEC, PHURINE, GLUCOSEU, HGBUR, BILIRUBINUR, KETONESUR, PROTEINUR, UROBILINOGEN, NITRITE, LEUKOCYTESUR in the last 72 hours.  Invalid input(s): APPERANCEUR    Imaging: No results found.   Medications:   . albumin human 12.5 g (05/04/20 0905)   . acidophilus  2 capsule Oral TID  . atorvastatin  80 mg Oral Daily  . Chlorhexidine Gluconate Cloth  6 each Topical Daily  . diclofenac Sodium  2 g Topical BID  . docusate sodium  200 mg Oral BID  . enoxaparin (LOVENOX) injection  40 mg Subcutaneous Q24H  . feeding supplement (ENSURE ENLIVE)  237 mL Oral BID BM  . ferrous sulfate  325 mg Oral Daily  . hydrocerin   Topical BID  . magnesium citrate  1 Bottle Oral Once   bisacodyl, pneumococcal 23 valent vaccine  Assessment/ Plan:  79 y.o. male with Hypertension, hyperlipidemia, atrial fibrillation, pulmonary hypertension, coronary disease status post CABG, diastolic CHF, anemia, CKD, was admitted on 04/30/2020 with Hyponatremia [E87.1]   #Hyponatremia #Peripheral edema, improved Hyponatremia treated with 3% Saline initially. Na+ 125 today.Patient asymptomatic,  progressing well. -Patient has tolerated treatment with tolvaptan quite well.  Serum sodium now increased to 130.  Case discussed with pharmacist and we will administer 1 additional dose of tolvaptan today.    LOS: 4 Anthany Thornhill 8/25/202111:41 AM

## 2020-05-04 NOTE — ACP (Advance Care Planning) (Signed)
Niece [name] is MPOA; pt. does not want life-prolonging treatment in situations of advanced dementia/thinking loss and illness likely leading to imminent death.  Pt. defers to MPOA if she differes in plan from Living Will.  AD notarized and placed in paper chart; copy scanned to [facility].

## 2020-05-04 NOTE — Progress Notes (Signed)
[facility] liaison n ote: New referral for [employer] community Palliative program to follow at home received from Palliative NP Kathie Rhodes.  TOC Ginnie Russoli aware. Patient information sent to referral. Thank you.  Flo Shanks BSN, RN, [facility] [employer] [phone]

## 2020-05-04 NOTE — Patient Outreach (Signed)
[facility]) Care Management  05/04/2020  Jordan Perkins [birth year] [id number]   [facility]N unsuccessful outreach to [facility] referred patient/complex care services  Jordan Perkins was referred to [facility] on 04/05/20 for MD referral for Referral Reason:Heart failure would benefit from outpatient case management through [facility] and then on 04/11/20 for EMMI general red alert for not knowing who to call about changes in condition and issues with wound healing- Resolved EMMI on 04/13/20 Insurance:[employer] medicare and medicaid [place] access [facility] admissions x1ED visits x 1in the last 6 months Last admission from [facility]  04/30/20 03/23/20 to 04/06/20 anasarca, bradycardia, acute on chronic diastoliccongestive Heart Failure (CHF)  Transition of care services noted to be completed by primary care MD office staff- [facility] care at [place] will be completed by primary care provider office who will refer to [facility] care management if needed.  Unsuccessful 05/04/20 outreach to Jordan Perkins No answer when his home number dialed [facility] RN CM noting in Epic patient admission on 04/30/20  Last [facility] RN CM outreach was on 04/26/20 with preparation to get Jordan Perkins to his upcoming 04/29/20 MD visit Epic notes indicates he was seen by Jordan Raring, NP and encouraged to increase his lasix dose to 80 mg, less than 2 Liters of fluid/day,low salt diet call MD for 2-5 lb wt gain and not drinking gatorade The next [facility] congestive Heart Failure (CHF) clinic appointment is 05/19/20 and 6 weeks f/u with Jordan Perkins On 04/30/20 his lab work indicated at critical NA value of 119 and he was called by his cardiology office to request he go to ED.He further had a NA value of 113 + asymptomatic Admitted for hyponatremia after Cardiology had EMS to follow up with patient at his home. Given NACL 3% hypertonic solution, Nephrology consult in ED   Jordan Perkins remains hospitalized at [facility]) on today 05/04/20   Social:Jordan Perkins is a 79 year old male who lives with his brother, Jordan Perkins  He nor his brother does not drive. His brother uses a scooter/moped and does not read well Jordan Perkins reads well but is Hard of hearing [facility]) and has minimal support services.  He has a neighbor Jordan Perkins who assists with transportation to medical appointments, pharmacy and for groceries when available He reports he is in walking distance of some of his MDs but is not ambulating well at this time He reports being able to completeActivities of daily living (ADLs) slowly.  Conditions:anasarca, bradycardia, acute on chronic diastolic congestive heartfailure with preserved ejection fraction(CHF) Atrial fibrillation(permanent)now off Xarelto secondary to FOBTand falls recurrent falls with generalized weakness,Coronary artery disease,Hyperlipidemia,Hypertension,PAH (pulmonary artery hypertension),Pleural effusion Hard of hearing (HOH) Reports he got both covid shots at [facility] in June 2021   VVO:HYWVPXT, walker  Plan: [facility] RN CM willfollow with Jordan Yono after discharge  Routed note to MD  Jordan Perkins. Jordan Hamman, RN, BSN, [place] Coordinator Office number [phone] Mobile number [phone]  Main THN number [phone] Fax number [phone]

## 2020-05-04 NOTE — Progress Notes (Signed)
PROGRESS NOTE    Jordan Perkins  GNF:[id number] DOB: 1941/02/21 DOA: 04/30/2020 PCP: [name], MD   Assessment & Plan:   Principal Problem:   Hyponatremia Active Problems:   CAD (coronary artery disease)   Atrial fibrillation (HCC)   Essential hypertension   Stage 3a chronic kidney disease   Coronary artery disease   Chronic diastolic CHF (congestive heart failure) (HCC)   Protein-calorie malnutrition, severe   Hyponatremia: Na is stable from day prior. S/p tolvaptan. Nephro following and recs apprec   Atrial fib: likely PAF. Not on any rate controlling meds at home. No anticoagulation secondary to high fall risk.   HTN: continue to hold lasix, lisinopril secondary to hyponatremia  CKDIIIa: stable, Cr is better than baseline. Will continue to monitor   CAD: continue on statin. Continue to hold lisinopril. No chest pain.   Chronic diastolic CHF: euvolemic. Continue to hold lasix, lisinopril & aldactone. Continue to monitor I/Os  Constipation: continue on colace.   Severe debility: PT recs home health    DVT prophylaxis: lovenox Code Status: full  Family Communication: Disposition Plan: likely d/c home w/ home health   Status is: Inpatient  Remains inpatient appropriate because:Persistent severe electrolyte disturbances   Dispo: The patient is from: Home              Anticipated d/c is to: Home w/ home health               Anticipated d/c date is: 2 days              Patient currently is not medically stable to d/c.    Consultants:   nephro    Procedures:   Antimicrobials:   Subjective: Pt c/o fatigue   Objective: Vitals:   05/03/20 1116 05/03/20 1645 05/03/20 2114 05/04/20 0621  BP: (!) 147/53 (!) 149/59 (!) 153/59 (!) 131/52  Pulse: (!) 53 65 73 61  Resp: 19 18 18 18   Temp: 97.6 F (36.4 C) 97.7 F (36.5 C) 98.4 F (36.9 C) 98.6 F (37 C)  TempSrc:   Oral Oral  SpO2: 100% 100% 100% 96%  Weight:      Height:         Intake/Output Summary (Last 24 hours) at 05/04/2020 1348 Last data filed at 05/04/2020 8469 Gross per 24 hour  Intake --  Output 1000 ml  Net -1000 ml   Filed Weights   05/02/20 0156 05/02/20 2217 05/03/20 0525  Weight: 76.6 kg 70 kg 69.9 kg    Examination:  General exam: Appears calm and comfortable  Respiratory system: Clear to auscultation. Respiratory effort normal. Cardiovascular system: S1 & S2+. No  rubs, gallops or clicks.  Gastrointestinal system: Abdomen is nondistended, soft and nontender. Normal bowel sounds heard. Central nervous system: Alert and oriented. Moves all 4 extremities Psychiatry: Judgement and insight appear normal. Flat mood and affect     Data Reviewed: I have personally reviewed following labs and imaging studies  CBC: Recent Labs  Lab 04/29/20 1009 04/30/20 1911  WBC 6.8 7.8  HGB 9.1* 9.0*  HCT 25.9* 25.7*  MCV 97 97.3  PLT 210 629   Basic Metabolic Panel: Recent Labs  Lab 04/30/20 1911 05/01/20 0215 05/02/20 0122 05/02/20 0553 05/03/20 0530 05/03/20 1704 05/04/20 0022 05/04/20 1203  NA 113*   < > 122* 126* 125* 130* 130*  --   K 4.9  --  4.6 4.7 4.5  --  5.3* 4.3  CL 81*  --  91* 91* 93*  --  96*  --   CO2 22  --  23 24 24   --  27  --   GLUCOSE 111*  --  118* 112* 122*  --  131*  --   BUN 27*  --  25* 22 27*  --  34*  --   CREATININE 1.30*  --  1.04 1.01 0.88  --  0.86  --   CALCIUM 8.5*  --  8.9 9.6 8.9  --  9.1  --    < > = values in this interval not displayed.   GFR: Estimated Creatinine Clearance: 72.2 mL/min (by C-G formula based on SCr of 0.86 mg/dL). Liver Function Tests: Recent Labs  Lab 05/01/20 0514  ALBUMIN 2.7*   No results for input(s): LIPASE, AMYLASE in the last 168 hours. No results for input(s): AMMONIA in the last 168 hours. Coagulation Profile: No results for input(s): INR, PROTIME in the last 168 hours. Cardiac Enzymes: No results for input(s): CKTOTAL, CKMB, CKMBINDEX, TROPONINI in the last  168 hours. BNP (last 3 results) No results for input(s): PROBNP in the last 8760 hours. HbA1C: No results for input(s): HGBA1C in the last 72 hours. CBG: Recent Labs  Lab 05/02/20 0154  GLUCAP 113*   Lipid Profile: No results for input(s): CHOL, HDL, LDLCALC, TRIG, CHOLHDL, LDLDIRECT in the last 72 hours. Thyroid Function Tests: No results for input(s): TSH, T4TOTAL, FREET4, T3FREE, THYROIDAB in the last 72 hours. Anemia Panel: No results for input(s): VITAMINB12, FOLATE, FERRITIN, TIBC, IRON, RETICCTPCT in the last 72 hours. Sepsis Labs: No results for input(s): PROCALCITON, LATICACIDVEN in the last 168 hours.  Recent Results (from the past 240 hour(s))  SARS Coronavirus 2 by RT PCR (hospital order, performed in [facility] hospital lab) Nasopharyngeal Nasopharyngeal Swab     Status: None   Collection Time: 05/01/20 12:15 AM   Specimen: Nasopharyngeal Swab  Result Value Ref Range Status   SARS Coronavirus 2 NEGATIVE NEGATIVE Final    Comment: (NOTE) SARS-CoV-2 target nucleic acids are NOT DETECTED.  The SARS-CoV-2 RNA is generally detectable in upper and lower respiratory specimens during the acute phase of infection. The lowest concentration of SARS-CoV-2 viral copies this assay can detect is 250 copies / mL. A negative result does not preclude SARS-CoV-2 infection and should not be used as the sole basis for treatment or other patient management decisions.  A negative result may occur with improper specimen collection / handling, submission of specimen other than nasopharyngeal swab, presence of viral mutation(s) within the areas targeted by this assay, and inadequate number of viral copies (<250 copies / mL). A negative result must be combined with clinical observations, patient history, and epidemiological information.  Fact Sheet for Patients:   StrictlyIdeas.no  Fact Sheet for Healthcare  Providers: BankingDealers.co.za  This test is not yet approved or  cleared by the [place] FDA and has been authorized for detection and/or diagnosis of SARS-CoV-2 by FDA under an Emergency Use Authorization (EUA).  This EUA will remain in effect (meaning this test can be used) for the duration of the COVID-19 declaration under Section 564(b)(1) of the Act, 21 U.S.C. section 360bbb-3(b)(1), unless the authorization is terminated or revoked sooner.  Performed at [facility], [address] [place], [place] [address]   MRSA PCR Screening     Status: None   Collection Time: 05/02/20  2:02 AM   Specimen: Nasal Mucosa; Nasopharyngeal  Result Value Ref Range Status   MRSA  by PCR NEGATIVE NEGATIVE Final    Comment:        The GeneXpert MRSA Assay (FDA approved for NASAL specimens only), is one component of a comprehensive MRSA colonization surveillance program. It is not intended to diagnose MRSA infection nor to guide or monitor treatment for MRSA infections. Performed at [facility], [address], [place], [place] [address]          Radiology Studies: No results found.      Scheduled Meds: . acidophilus  2 capsule Oral TID  . atorvastatin  80 mg Oral Daily  . Chlorhexidine Gluconate Cloth  6 each Topical Daily  . diclofenac Sodium  2 g Topical BID  . docusate sodium  200 mg Oral BID  . enoxaparin (LOVENOX) injection  40 mg Subcutaneous Q24H  . feeding supplement (ENSURE ENLIVE)  237 mL Oral BID BM  . ferrous sulfate  325 mg Oral Daily  . hydrocerin   Topical BID  . magnesium citrate  1 Bottle Oral Once   Continuous Infusions: . albumin human 12.5 g (05/04/20 0905)     LOS: 4 days    Time spent: 32 mins    Wyvonnia Dusky, MD [facility] Pager [phone]  If 7PM-7AM, please contact night-coverage [email or web address] 05/04/2020, 1:48 PM

## 2020-05-05 ENCOUNTER — Telehealth: Payer: Self-pay | Admitting: Family Medicine

## 2020-05-05 DIAGNOSIS — N1831 Chronic kidney disease, stage 3a: Secondary | ICD-10-CM

## 2020-05-05 LAB — BASIC METABOLIC PANEL
Anion gap: 8 (ref 5–15)
BUN: 33 mg/dL — ABNORMAL HIGH (ref 8–23)
CO2: 25 mmol/L (ref 22–32)
Calcium: 9.1 mg/dL (ref 8.9–10.3)
Chloride: 100 mmol/L (ref 98–111)
Creatinine, Ser: 0.88 mg/dL (ref 0.61–1.24)
GFR calc Af Amer: >60 mL/min (ref >60)
GFR calc non Af Amer: >60 mL/min (ref >60)
Glucose, Bld: 102 mg/dL — ABNORMAL HIGH (ref 70–99)
Potassium: 4.3 mmol/L (ref 3.5–5.1)
Sodium: 133 mmol/L — ABNORMAL LOW (ref 135–145)

## 2020-05-05 LAB — CBC
HCT: 22.5 % — ABNORMAL LOW (ref 39.0–52.0)
Hemoglobin: 8 g/dL — ABNORMAL LOW (ref 13.0–17.0)
MCH: 34.6 pg — ABNORMAL HIGH (ref 26.0–34.0)
MCHC: 35.6 g/dL (ref 30.0–36.0)
MCV: 97.4 fL (ref 80.0–100.0)
Platelets: 148 10*3/uL — ABNORMAL LOW (ref 150–400)
RBC: 2.31 MIL/uL — ABNORMAL LOW (ref 4.22–5.81)
RDW: 14.5 % (ref 11.5–15.5)
WBC: 6.9 10*3/uL (ref 4.0–10.5)
nRBC: 0 % (ref 0.0–0.2)

## 2020-05-05 NOTE — Progress Notes (Signed)
Mobility Specialist - Progress Note   05/05/20 1234  Mobility  Activity Ambulated in room;Ambulated in hall  Level of Assistance Contact guard assist, steadying assist  [place] wheel walker  Distance Ambulated (ft) 180 ft  Mobility Response Tolerated well  Mobility performed by Mobility specialist  $Mobility charge 1 Mobility    Pre-mobility: 68 HR, 125/49 BP, 98% SpO2 Post-mobility: 71 HR, 137/56 BP, 98% SpO2   Pt was laying in bed upon arrival. Pt agreed to session. Pt supine to sitting EOB SBA. Pt S2S CGA. Pt ambulated 180' total in room and hallway using a RW w/ CGA. Pt had a tendency to ambulate w/ RW ahead of him. Pt cued to keep RW close. Pt had slight L drift when ambulating, but can be corrected by verbal cues. Overall, pt tolerated session well. Pt left laying in bed w/ call bell and phone placed in reach. Nurse was notified.      Brentt Fread Mobility Specialist  05/05/20, 12:41 PM

## 2020-05-05 NOTE — Telephone Encounter (Signed)
Noted. Will follow as appropriate.  

## 2020-05-05 NOTE — Care Management Important Message (Signed)
Important Message  Patient Details  Name: Jordan Perkins MRN: [id number] Date of Birth: 1941/02/24   Medicare Important Message Given:  Yes     [name] 05/05/2020, 1:40 PM

## 2020-05-05 NOTE — Discharge Summary (Signed)
Physician Discharge Summary  Jordan Perkins [id number] DOB: [birth year] DOA: 04/30/2020  PCP: [name], MD  Admit date: 04/30/2020 Discharge date: 05/05/2020  Admitted From: home  Disposition:  Home w/ home health   Recommendations for Outpatient Follow-up:  1. Follow up with PCP in 1-2 weeks 2. F/u nephro in 1-2 weeks, will need BMP to check Na levels   Home Health: yes Equipment/Devices:  Discharge Condition: stable  CODE STATUS: full  Diet recommendation: Heart Healthy   Brief/Interim Summary: HPI was taken from Dr. Damita Dunnings: Jordan Perkins is a 79 y.o. male with medical history significant for hypertension, hyperlipidemia, A. fib off Xarelto since 7/14 due to frequent falls, anemia, pulmonary artery hypertension, coronary artery disease, CABG, diastolic congestive heart failure, anemia, CKD stage III and possible dementia, who presents to the ER on referral from his cardiologist for low sodium of 119.  Patient went to see his cardiologist yesterday with a complaint of increasing weakness and lower extremity edema for the past 3 to 4 days.  Patient denies shortness of breath beyond his baseline.  Has a mild headache but denies nausea, vomiting, visual disturbance or problems with thinking unsteady gait.  Patient was recently hospitalized from 7/14-7/28 with anasarca and was aggressively diuresed. ED Course: On arrival, patient was alert and oriented x3 with normal vitals.  Repeat blood work showed sodium of 113.  Hemoglobin 9.0 which is around his baseline.  BNP 283.  Troponin normal at 8.  Serum and urine osmolality and urine sodium pending.  EKG showed rate controlled A. fib with no acute ST-T wave changes.  Chest x-ray with small right pleural effusion and very mild bibasilar atelectasis.  The emergency room provider spoke with on-call nephrologist who recommended 3% saline at 30 mils per hour x3 hours with reassessment of sodium at that time and continued management  thereafter.  Hospitalist, consulted for admission.  Hospital Course from Dr. Lenise Herald 05/04/20-05/05/20: Pt presented w/ hyponatremia of unknown etiology. Pt initially treated w/ 3% saline and then tolvaptan x 2. Pt responded well to tolvaptan. Pt will need to f/u outpatient for BMP to check his sodium levels. Pt verbalized his understanding. Of note, PT/OT saw the pt and recommended home health. Home health was set by CM prior to d/c. Also, palliative care will continue to see the pt outpatient as well. For more information please see previous progress notes.    Discharge Diagnoses:  Principal Problem:   Hyponatremia Active Problems:   CAD (coronary artery disease)   Atrial fibrillation (HCC)   Essential hypertension   Stage 3a chronic kidney disease   Coronary artery disease   Chronic diastolic CHF (congestive heart failure) (HCC)   Protein-calorie malnutrition, severe   Goals of care, counseling/discussion   Palliative care by specialist   DNR (do not resuscitate)  Hyponatremia: Na is trending up. S/p tolvaptan. Nephro following and recs apprec   Atrial fib: likely PAF. Not on any rate controlling meds at home. No anticoagulation secondary to high fall risk.   HTN: continue to hold lasix, lisinopril secondary to hyponatremia  CKDIIIa: stable, Cr is better than baseline. Will continue to monitor   CAD: continue on statin. Continue to hold lisinopril. No chest pain.   Chronic diastolic CHF: euvolemic. Continue to hold lasix, lisinopril & aldactone while inpatient. Continue to monitor I/Os  Constipation: continue on colace.   Severe debility: PT recs home health   Discharge Instructions  Discharge Instructions    Diet general  Complete by: As directed    Heart healthy diet   Discharge instructions   Complete by: As directed    F/u PCP in 1-2 weeks. F/u nephro, Dr. Holley Raring, in 2 weeks. Will need a BMP to check sodium levels as an outpatient   Increase activity slowly    Complete by: As directed      Allergies as of 05/05/2020      Reactions   Sulfa Antibiotics Rash   Rash/hives   Other Rash      Medication List    TAKE these medications   atorvastatin 80 MG tablet Commonly known as: LIPITOR TAKE 1 TABLET BY MOUTH EVERY DAY   ferrous sulfate 325 (65 FE) MG tablet Take 1 tablet (325 mg total) by mouth daily.   furosemide 40 MG tablet Commonly known as: LASIX Take 1 tablet (40 mg total) by mouth daily. What changed: Another medication with the same name was removed. Continue taking this medication, and follow the directions you see here.   lisinopril 40 MG tablet Commonly known as: ZESTRIL Take 1 tablet (40 mg total) by mouth daily.   spironolactone 25 MG tablet Commonly known as: ALDACTONE Take 1 tablet (25 mg total) by mouth daily.       Follow-up Information    [name], MD Follow up in 1 week(s).   Specialty: Family Medicine Contact information: [address] [place] [address] [phone]        [name], MD .   Specialty: Cardiology Contact information: [place] [address] [phone]        Anthonette Legato, MD Follow up in 2 week(s).   Specialty: Nephrology Contact information: [place] [address] [phone]              Allergies  Allergen Reactions  . Sulfa Antibiotics Rash    Rash/hives   . Other Rash    Consultations:  Nephro, Dr. Holley Raring    Procedures/Studies: DG Chest 2 View  Result Date: 04/30/2020 CLINICAL DATA:  Weakness. EXAM: CHEST - 2 VIEW COMPARISON:  March 23, 2020 FINDINGS: Multiple sternal wires and vascular clips are seen. Very mild atelectasis is noted within the lateral aspects of the bilateral lung bases. There is a small right pleural effusion. No pneumothorax is identified. The heart size and mediastinal contours are within normal limits. The visualized skeletal structures are  unremarkable. IMPRESSION: 1. Evidence of prior median sternotomy/CABG. 2. Very mild bibasilar atelectasis. 3. Small right pleural effusion. Electronically Signed   By: Virgina Norfolk M.D.   On: 04/30/2020 19:34   CT HEAD WO CONTRAST  Result Date: 05/01/2020 CLINICAL DATA:  Headache and multiple falls EXAM: CT HEAD WITHOUT CONTRAST TECHNIQUE: Contiguous axial images were obtained from the base of the skull through the vertex without intravenous contrast. COMPARISON:  None. FINDINGS: Brain: There is no mass, hemorrhage or extra-axial collection. The appearance of the white matter is normal for the patient's age. There is generalized atrophy. Vascular: No abnormal hyperdensity of the major intracranial arteries or dural venous sinuses. No intracranial atherosclerosis. Skull: The visualized skull base, calvarium and extracranial soft tissues are normal. Sinuses/Orbits: No fluid levels or advanced mucosal thickening of the visualized paranasal sinuses. No mastoid or middle ear effusion. The orbits are normal. IMPRESSION: Generalized atrophy without acute intracranial abnormality. Electronically Signed   By: Ulyses Jarred M.D.   On: 05/01/2020 06:15     Subjective: Pt  c/o malaise   Discharge Exam: Vitals:   05/05/20 0741 05/05/20 1122  BP: (!) 133/41 (!) 128/44  Pulse: (!) 59 (!) 57  Resp: 17 17  Temp: 98.2 F (36.8 C) 98.4 F (36.9 C)  SpO2: 100% 98%   Vitals:   05/04/20 1933 05/05/20 0538 05/05/20 0741 05/05/20 1122  BP: (!) 145/54 (!) 131/48 (!) 133/41 (!) 128/44  Pulse: 77 67 (!) 59 (!) 57  Resp: 20 20 17 17   Temp: 98.3 F (36.8 C) (!) 97.5 F (36.4 C) 98.2 F (36.8 C) 98.4 F (36.9 C)  TempSrc: Oral Oral    SpO2: 100% 98% 100% 98%  Weight:  70.4 kg    Height:        General: Pt is alert, awake, not in acute distress Cardiovascular: S1/S2 +, no rubs, no gallops Respiratory: CTA bilaterally, no wheezing, no rhonchi Abdominal: Soft, NT, ND, bowel sounds + Extremities: no  edema, no cyanosis    The results of significant diagnostics from this hospitalization (including imaging, microbiology, ancillary and laboratory) are listed below for reference.     Microbiology: Recent Results (from the past 240 hour(s))  SARS Coronavirus 2 by RT PCR (hospital order, performed in [facility] hospital lab) Nasopharyngeal Nasopharyngeal Swab     Status: None   Collection Time: 05/01/20 12:15 AM   Specimen: Nasopharyngeal Swab  Result Value Ref Range Status   SARS Coronavirus 2 NEGATIVE NEGATIVE Final    Comment: (NOTE) SARS-CoV-2 target nucleic acids are NOT DETECTED.  The SARS-CoV-2 RNA is generally detectable in upper and lower respiratory specimens during the acute phase of infection. The lowest concentration of SARS-CoV-2 viral copies this assay can detect is 250 copies / mL. A negative result does not preclude SARS-CoV-2 infection and should not be used as the sole basis for treatment or other patient management decisions.  A negative result may occur with improper specimen collection / handling, submission of specimen other than nasopharyngeal swab, presence of viral mutation(s) within the areas targeted by this assay, and inadequate number of viral copies (<250 copies / mL). A negative result must be combined with clinical observations, patient history, and epidemiological information.  Fact Sheet for Patients:   StrictlyIdeas.no  Fact Sheet for Healthcare Providers: BankingDealers.co.za  This test is not yet approved or  cleared by the [place] FDA and has been authorized for detection and/or diagnosis of SARS-CoV-2 by FDA under an Emergency Use Authorization (EUA).  This EUA will remain in effect (meaning this test can be used) for the duration of the COVID-19 declaration under Section 564(b)(1) of the Act, 21 U.S.C. section 360bbb-3(b)(1), unless the authorization is terminated or revoked  sooner.  Performed at [facility], [address] [place], [place] [address]   MRSA PCR Screening     Status: None   Collection Time: 05/02/20  2:02 AM   Specimen: Nasal Mucosa; Nasopharyngeal  Result Value Ref Range Status   MRSA by PCR NEGATIVE NEGATIVE Final    Comment:        The GeneXpert MRSA Assay (FDA approved for NASAL specimens only), is one component of a comprehensive MRSA colonization surveillance program. It is not intended to diagnose MRSA infection nor to guide or monitor treatment for MRSA infections. Performed at [facility], [address] [place], [place] [address]      Labs: BNP (last 3 results) Recent Labs    03/23/20 0728 04/14/20 1027 04/30/20 1912  BNP 574.7* 283.3* 283.6*   Basic  Metabolic Panel: Recent Labs  Lab 05/02/20 0122 05/02/20 0122 05/02/20 0553 05/03/20 0530 05/03/20 1704 05/04/20 0022 05/04/20 1203 05/05/20 0708  NA 122*   < > 126* 125* 130* 130*  --  133*  K 4.6   < > 4.7 4.5  --  5.3* 4.3 4.3  CL 91*  --  91* 93*  --  96*  --  100  CO2 23  --  24 24  --  27  --  25  GLUCOSE 118*  --  112* 122*  --  131*  --  102*  BUN 25*  --  22 27*  --  34*  --  33*  CREATININE 1.04  --  1.01 0.88  --  0.86  --  0.88  CALCIUM 8.9  --  9.6 8.9  --  9.1  --  9.1   < > = values in this interval not displayed.   Liver Function Tests: Recent Labs  Lab 05/01/20 0514  ALBUMIN 2.7*   No results for input(s): LIPASE, AMYLASE in the last 168 hours. No results for input(s): AMMONIA in the last 168 hours. CBC: Recent Labs  Lab 04/29/20 1009 04/30/20 1911 05/05/20 0708  WBC 6.8 7.8 6.9  HGB 9.1* 9.0* 8.0*  HCT 25.9* 25.7* 22.5*  MCV 97 97.3 97.4  PLT 210 187 148*   Cardiac Enzymes: No results for input(s): CKTOTAL, CKMB, CKMBINDEX, TROPONINI in the last 168 hours. BNP: Invalid input(s): POCBNP CBG: Recent Labs  Lab 05/02/20 0154  GLUCAP 113*   D-Dimer No results for input(s): DDIMER in the last 72  hours. Hgb A1c No results for input(s): HGBA1C in the last 72 hours. Lipid Profile No results for input(s): CHOL, HDL, LDLCALC, TRIG, CHOLHDL, LDLDIRECT in the last 72 hours. Thyroid function studies No results for input(s): TSH, T4TOTAL, T3FREE, THYROIDAB in the last 72 hours.  Invalid input(s): FREET3 Anemia work up No results for input(s): VITAMINB12, FOLATE, FERRITIN, TIBC, IRON, RETICCTPCT in the last 72 hours. Urinalysis    Component Value Date/Time   COLORURINE YELLOW (A) 03/23/2020 0913   APPEARANCEUR CLEAR (A) 03/23/2020 0913   APPEARANCEUR Hazy 06/06/2013 0947   LABSPEC 1.011 03/23/2020 0913   LABSPEC 1.021 06/06/2013 0947   PHURINE 7.0 03/23/2020 0913   GLUCOSEU NEGATIVE 03/23/2020 0913   GLUCOSEU Negative 06/06/2013 0947   HGBUR NEGATIVE 03/23/2020 0913   BILIRUBINUR NEGATIVE 03/23/2020 0913   BILIRUBINUR Negative 06/06/2013 0947   KETONESUR NEGATIVE 03/23/2020 0913   PROTEINUR NEGATIVE 03/23/2020 0913   NITRITE NEGATIVE 03/23/2020 0913   LEUKOCYTESUR NEGATIVE 03/23/2020 0913   LEUKOCYTESUR Negative 06/06/2013 0947   Sepsis Labs Invalid input(s): PROCALCITONIN,  WBC,  LACTICIDVEN Microbiology Recent Results (from the past 240 hour(s))  SARS Coronavirus 2 by RT PCR (hospital order, performed in [facility] hospital lab) Nasopharyngeal Nasopharyngeal Swab     Status: None   Collection Time: 05/01/20 12:15 AM   Specimen: Nasopharyngeal Swab  Result Value Ref Range Status   SARS Coronavirus 2 NEGATIVE NEGATIVE Final    Comment: (NOTE) SARS-CoV-2 target nucleic acids are NOT DETECTED.  The SARS-CoV-2 RNA is generally detectable in upper and lower respiratory specimens during the acute phase of infection. The lowest concentration of SARS-CoV-2 viral copies this assay can detect is 250 copies / mL. A negative result does not preclude SARS-CoV-2 infection and should not be used as the sole basis for treatment or other patient management decisions.  A negative  result may occur with improper specimen collection /  handling, submission of specimen other than nasopharyngeal swab, presence of viral mutation(s) within the areas targeted by this assay, and inadequate number of viral copies (<250 copies / mL). A negative result must be combined with clinical observations, patient history, and epidemiological information.  Fact Sheet for Patients:   StrictlyIdeas.no  Fact Sheet for Healthcare Providers: BankingDealers.co.za  This test is not yet approved or  cleared by the [place] FDA and has been authorized for detection and/or diagnosis of SARS-CoV-2 by FDA under an Emergency Use Authorization (EUA).  This EUA will remain in effect (meaning this test can be used) for the duration of the COVID-19 declaration under Section 564(b)(1) of the Act, 21 U.S.C. section 360bbb-3(b)(1), unless the authorization is terminated or revoked sooner.  Performed at [facility], [address] [place], [place] [address]   MRSA PCR Screening     Status: None   Collection Time: 05/02/20  2:02 AM   Specimen: Nasal Mucosa; Nasopharyngeal  Result Value Ref Range Status   MRSA by PCR NEGATIVE NEGATIVE Final    Comment:        The GeneXpert MRSA Assay (FDA approved for NASAL specimens only), is one component of a comprehensive MRSA colonization surveillance program. It is not intended to diagnose MRSA infection nor to guide or monitor treatment for MRSA infections. Performed at [facility], [address], [place], [place] [address]      Time coordinating discharge: Over 30 minutes  SIGNED:   Wyvonnia Dusky, MD  [facility] 05/05/2020, 12:58 PM Pager   If 7PM-7AM, please contact night-coverage [email or web address]

## 2020-05-05 NOTE — Discharge Instructions (Signed)
Hyponatremia Hyponatremia is when the amount of salt (sodium) in your blood is too low. When salt levels are low, your body may take in extra water. This can cause swelling throughout the body. The swelling often affects the brain. What are the causes? This condition may be caused by:  Certain medical problems or conditions.  Vomiting a lot.  Having watery poop (diarrhea) often.  Certain medicines or illegal drugs.  Not having enough water in the body (dehydration).  Drinking too much water.  Eating a diet that is low in salt.  Large burns on your body.  Too much sweating. What increases the risk? You are more likely to get this condition if you:  Have long-term (chronic) kidney disease.  Have heart failure.  Have a medical condition that causes you to have watery poop often.  Do very hard exercises.  Take medicines that affect the amount of salt is in your blood. What are the signs or symptoms? Symptoms of this condition include:  Headache.  Feeling like you may vomit (nausea).  Vomiting.  Being very tired (lethargic).  Muscle weakness and cramps.  Not wanting to eat as much as normal (loss of appetite).  Feeling weak or light-headed. Severe symptoms of this condition include:  Confusion.  Feeling restless (agitation).  Having a fast heart rate.  Passing out (fainting).  Seizures.  Coma. How is this treated? Treatment for this condition depends on the cause. Treatment may include:  Getting fluids through an IV tube that is put into one of your veins.  Taking medicines to fix the salt levels in your blood. If medicines are causing the problem, your medicines will need to be changed.  Limiting how much water or fluid you take in.  Monitoring in the hospital to watch your symptoms. Follow these instructions at home:   Take over-the-counter and prescription medicines only as told by your doctor. Many medicines can make this condition worse.  Talk with your doctor about any medicines that you are taking.  Eat and drink exactly as you are told by your doctor. ? Eat only the foods you are told to eat. ? Limit how much fluid you take.  Do not drink alcohol.  Keep all follow-up visits as told by your doctor. This is important. Contact a doctor if:  You feel more like you may vomit.  You feel more tired.  Your headache gets worse.  You feel more confused.  You feel weaker.  Your symptoms go away and then they come back.  You have trouble following the diet instructions. Get help right away if:  You have a seizure.  You pass out.  You keep having watery poop.  You keep vomiting. Summary  Hyponatremia is when the amount of salt in your blood is too low.  When salt levels are low, you can have swelling throughout the body. The swelling mostly affects the brain.  Treatment depends on the cause. Treatment may include getting IV fluids, medicines, or not drinking as much fluid. This information is not intended to replace advice given to you by your health care provider. Make sure you discuss any questions you have with your health care provider. Document Revised: 11/13/2018 Document Reviewed: 07/31/2018 Elsevier Patient Education  2020 Elsevier Inc.  

## 2020-05-05 NOTE — Progress Notes (Signed)
[facility]  ROUNDING NOTE   Subjective:  Sodium up to 133 now.  Feeling better. Has tolerated tolvaptan well.   Objective:  Vital signs in last 24 hours:  Temp:  [97.5 F (36.4 C)-98.9 F (37.2 C)] 98.4 F (36.9 C) (08/26 1122) Pulse Rate:  [57-77] 57 (08/26 1122) Resp:  [17-20] 17 (08/26 1122) BP: (128-145)/(41-54) 128/44 (08/26 1122) SpO2:  [98 %-100 %] 98 % (08/26 1122) Weight:  [70.4 kg] 70.4 kg (08/26 0538)  Weight change:  Filed Weights   05/02/20 2217 05/03/20 0525 05/05/20 0538  Weight: 70 kg 69.9 kg 70.4 kg    Intake/Output: I/O last 3 completed shifts: In: -  Out: 1900 [Urine:1900]   Intake/Output this shift:  Total I/O In: 240 [P.O.:240] Out: -   Physical Exam: General:  No acute distress  Head:  Moist oral mucosal membranes  Eyes:  Anicteric  Neck:  Trachea at the midline  Lungs:   Normal and  symmetrical respirations,Lungs clear  Heart:  S1S2,Regular, no rub or gallop  Abdomen:   Soft, nontender  Extremities:  No lower extremity edema  Neurologic:  Awake, alert,oriented  Skin:  No acute rashes or lesions       Basic Metabolic Panel: Recent Labs  Lab 05/02/20 0122 05/02/20 0122 05/02/20 0553 05/02/20 0553 05/03/20 0530 05/03/20 1704 05/04/20 0022 05/04/20 1203 05/05/20 0708  NA 122*   < > 126*  --  125* 130* 130*  --  133*  K 4.6   < > 4.7  --  4.5  --  5.3* 4.3 4.3  CL 91*  --  91*  --  93*  --  96*  --  100  CO2 23  --  24  --  24  --  27  --  25  GLUCOSE 118*  --  112*  --  122*  --  131*  --  102*  BUN 25*  --  22  --  27*  --  34*  --  33*  CREATININE 1.04  --  1.01  --  0.88  --  0.86  --  0.88  CALCIUM 8.9   < > 9.6   < > 8.9  --  9.1  --  9.1   < > = values in this interval not displayed.    Liver Function Tests: Recent Labs  Lab 05/01/20 0514  ALBUMIN 2.7*   No results for input(s): LIPASE, AMYLASE in the last 168 hours. No results for input(s): AMMONIA in the last 168 hours.  CBC: Recent Labs  Lab  04/29/20 1009 04/30/20 1911 05/05/20 0708  WBC 6.8 7.8 6.9  HGB 9.1* 9.0* 8.0*  HCT 25.9* 25.7* 22.5*  MCV 97 97.3 97.4  PLT 210 187 148*    Cardiac Enzymes: No results for input(s): CKTOTAL, CKMB, CKMBINDEX, TROPONINI in the last 168 hours.  BNP: Invalid input(s): POCBNP  CBG: Recent Labs  Lab 05/02/20 0154  GLUCAP 113*    Microbiology: Results for orders placed or performed during the hospital encounter of 04/30/20  SARS Coronavirus 2 by RT PCR (hospital order, performed in [facility] hospital lab) Nasopharyngeal Nasopharyngeal Swab     Status: None   Collection Time: 05/01/20 12:15 AM   Specimen: Nasopharyngeal Swab  Result Value Ref Range Status   SARS Coronavirus 2 NEGATIVE NEGATIVE Final    Comment: (NOTE) SARS-CoV-2 target nucleic acids are NOT DETECTED.  The SARS-CoV-2 RNA is generally detectable in upper and lower respiratory specimens during  the acute phase of infection. The lowest concentration of SARS-CoV-2 viral copies this assay can detect is 250 copies / mL. A negative result does not preclude SARS-CoV-2 infection and should not be used as the sole basis for treatment or other patient management decisions.  A negative result may occur with improper specimen collection / handling, submission of specimen other than nasopharyngeal swab, presence of viral mutation(s) within the areas targeted by this assay, and inadequate number of viral copies (<250 copies / mL). A negative result must be combined with clinical observations, patient history, and epidemiological information.  Fact Sheet for Patients:   StrictlyIdeas.no  Fact Sheet for Healthcare Providers: BankingDealers.co.za  This test is not yet approved or  cleared by the [place] FDA and has been authorized for detection and/or diagnosis of SARS-CoV-2 by FDA under an Emergency Use Authorization (EUA).  This EUA will remain in effect (meaning  this test can be used) for the duration of the COVID-19 declaration under Section 564(b)(1) of the Act, 21 U.S.C. section 360bbb-3(b)(1), unless the authorization is terminated or revoked sooner.  Performed at [facility], [address] [place], [place] [address]   MRSA PCR Screening     Status: None   Collection Time: 05/02/20  2:02 AM   Specimen: Nasal Mucosa; Nasopharyngeal  Result Value Ref Range Status   MRSA by PCR NEGATIVE NEGATIVE Final    Comment:        The GeneXpert MRSA Assay (FDA approved for NASAL specimens only), is one component of a comprehensive MRSA colonization surveillance program. It is not intended to diagnose MRSA infection nor to guide or monitor treatment for MRSA infections. Performed at [facility], [address] [place], [place] [address]     Coagulation Studies: No results for input(s): LABPROT, INR in the last 72 hours.  Urinalysis: No results for input(s): COLORURINE, LABSPEC, PHURINE, GLUCOSEU, HGBUR, BILIRUBINUR, KETONESUR, PROTEINUR, UROBILINOGEN, NITRITE, LEUKOCYTESUR in the last 72 hours.  Invalid input(s): APPERANCEUR    Imaging: No results found.   Medications:   . albumin human 12.5 g (05/05/20 1000)   . acidophilus  2 capsule Oral TID  . atorvastatin  80 mg Oral Daily  . Chlorhexidine Gluconate Cloth  6 each Topical Daily  . diclofenac Sodium  2 g Topical BID  . docusate sodium  200 mg Oral BID  . enoxaparin (LOVENOX) injection  40 mg Subcutaneous Q24H  . feeding supplement (ENSURE ENLIVE)  237 mL Oral BID BM  . ferrous sulfate  325 mg Oral Daily  . hydrocerin   Topical BID  . magnesium citrate  1 Bottle Oral Once   bisacodyl, pneumococcal 23 valent vaccine  Assessment/ Plan:  79 y.o. male with Hypertension, hyperlipidemia, atrial fibrillation, pulmonary hypertension, coronary disease status post CABG, diastolic CHF, anemia, CKD, was admitted on 04/30/2020 with Hyponatremia [E87.1]    #Hyponatremia #Peripheral edema, improved Hyponatremia treated with 3% Saline initially. Na+ 125 today.Patient asymptomatic, progressing well. -Patient has done well with tolvaptan, Na up to 133.  It has now been stopped.  Recommend monitoring of Na as outpt periodically.      LOS: 5 Jordan Perkins 8/26/202111:23 AM

## 2020-05-05 NOTE — Telephone Encounter (Signed)
[facility] called to request Hospital  follow up appointment for patient scheduled it on 05-18-20 at 11:30

## 2020-05-06 ENCOUNTER — Telehealth: Payer: Self-pay

## 2020-05-06 DIAGNOSIS — Z748 Other problems related to care provider dependency: Secondary | ICD-10-CM

## 2020-05-06 NOTE — Telephone Encounter (Signed)
Transition Care Management Follow-up Telephone Call  Date of discharge and from where: 05/05/20 from [facility]  How have you been since you were released from the hospital? Patient states, "I am doing pretty good. I am getting a little stronger." Input/output appropriate. Staying hydrated. Denies falls, dizziness, headache, extreme fatigue, nausea, vomiting, diarrhea. Ambulates with walker.   Any questions or concerns? No  Items Reviewed:  Did the pt receive and understand the discharge instructions provided? Yes , increase activity as tolerated.   Medications obtained and verified? Yes , taking all scheduled medications as directed.   Any new allergies since your discharge? Yes   Dietary orders reviewed? Yes, high protein, heart healthy.  Do you have support at home? Yes , brother  Functional Questionnaire: (I = Independent and D = Dependent) ADLs: i  Eating- i  Maintaining continence- i  Transferring/Ambulation- walker  Managing Meds- i  Follow up appointments reviewed:   PCP Hospital f/u appt confirmed? Yes  Scheduled to see Dr. Caryl Bis on 05/18/20 @ 11:30.  [facility] f/u appt confirmed? Yes  Scheduled to see Nephrology 05/12/20. Number provided for Cardiology scheduling [phone] (Dr. Rockey Situ).   Are transportation arrangements needed? Yes . Referral sent to connected care.   If their condition worsens, is the pt aware to call PCP or go to the Emergency Dept.? Yes  Was the patient provided with contact information for the PCP's office or ED? Yes  Was to pt encouraged to call back with questions or concerns? Yes

## 2020-05-06 NOTE — Telephone Encounter (Signed)
Reviewed

## 2020-05-07 DIAGNOSIS — I4891 Unspecified atrial fibrillation: Secondary | ICD-10-CM | POA: Diagnosis not present

## 2020-05-09 ENCOUNTER — Telehealth: Payer: Self-pay | Admitting: Family

## 2020-05-09 DIAGNOSIS — I700 Atherosclerosis of aorta: Secondary | ICD-10-CM | POA: Diagnosis not present

## 2020-05-09 DIAGNOSIS — E785 Hyperlipidemia, unspecified: Secondary | ICD-10-CM | POA: Diagnosis not present

## 2020-05-09 DIAGNOSIS — N1831 Chronic kidney disease, stage 3a: Secondary | ICD-10-CM | POA: Diagnosis not present

## 2020-05-09 DIAGNOSIS — I2510 Atherosclerotic heart disease of native coronary artery without angina pectoris: Secondary | ICD-10-CM | POA: Diagnosis not present

## 2020-05-09 DIAGNOSIS — I5032 Chronic diastolic (congestive) heart failure: Secondary | ICD-10-CM | POA: Diagnosis not present

## 2020-05-09 DIAGNOSIS — E1122 Type 2 diabetes mellitus with diabetic chronic kidney disease: Secondary | ICD-10-CM | POA: Diagnosis not present

## 2020-05-09 DIAGNOSIS — E1151 Type 2 diabetes mellitus with diabetic peripheral angiopathy without gangrene: Secondary | ICD-10-CM | POA: Diagnosis not present

## 2020-05-09 DIAGNOSIS — D631 Anemia in chronic kidney disease: Secondary | ICD-10-CM | POA: Diagnosis not present

## 2020-05-09 DIAGNOSIS — I130 Hypertensive heart and chronic kidney disease with heart failure and stage 1 through stage 4 chronic kidney disease, or unspecified chronic kidney disease: Secondary | ICD-10-CM | POA: Diagnosis not present

## 2020-05-09 DIAGNOSIS — I4891 Unspecified atrial fibrillation: Secondary | ICD-10-CM | POA: Diagnosis not present

## 2020-05-09 NOTE — Telephone Encounter (Signed)
Reviewed preliminary ZIO report, will ask nursing staff to upload to Epic for his primary cardiologist to review. Further recommendations to be made at that time.  14 day ZIO ordered for assessment of bradycardia in known atrial fib. Monitor with 100% atrial fibrillation burden average rate 56 bmp with range 32bpm - 104bpm. Not on AV nodal blocking agents. Monitor also noted 2 VT runs (7 beats 171 bpm). Occasional PVC. Single pause of 3 seconds at 20 bpm occurred at 7:04 AM. No triggered events. No changes to plan of care at this time.   He was recently admitted 04/30/20 - 05/05/20 for hyponatremia with no significant bradycardia nor pauses per discharge summary.  Despite low heart rates as he is asymptomatic and presently undergoing workup for positive FOBT and recent admission, no indication for urgent PPM placement. Will discuss with his primary cardiologist.   He has appointment 05/12/20 with nephrology, 05/18/20 with his PCP, 05/19/20 with [facility] clinic, and 06/09/20 with myself. Follow up as scheduled.   Loel Dubonnet, NP

## 2020-05-09 NOTE — Telephone Encounter (Signed)
Noted  

## 2020-05-09 NOTE — Telephone Encounter (Addendum)
Finalized zio report available on the [employer]. Report printed and given to the ordering provider Laurann [place], NP to review and advise.

## 2020-05-09 NOTE — Telephone Encounter (Signed)
  1. Is this related to a heart monitor you are wearing?  (If the patient says no, please ask     if they are caling about ICD/pacemaker.)  No   2. What is your issue??  Abnormal zio report please call   Reference # [id number]  Please route to covering RN/CMA/RMA for results. Route to monitor technicians or your monitor tech representative for your site for any technical concerns

## 2020-05-10 ENCOUNTER — Ambulatory Visit: Payer: Self-pay | Admitting: *Deleted

## 2020-05-10 ENCOUNTER — Other Ambulatory Visit: Payer: Self-pay | Admitting: *Deleted

## 2020-05-10 ENCOUNTER — Telehealth: Payer: Self-pay | Admitting: Primary Care

## 2020-05-10 NOTE — Patient Outreach (Signed)
[facility] [facility]) Care Management  05/10/2020  Jordan Perkins September 06, 1941 [id number]   CSW attempted to reach pt by phone today and was unable.  CSW left a HIPPA compliant voice message and will attempt outreach again in 3-4 business days if no return call is received.   Eduard Clos, MSW, [place] Worker  [place] [phone]

## 2020-05-10 NOTE — Telephone Encounter (Signed)
Called patient's niece, [name], to schedule the Palliative Consult and she requested that I contact her sister, Jamelle Rushing, to schedule the visit.  She stated that Hilda Blades lives close to patient and works from home and would be able to be there for the Consult.  Called and spoke with Hilda Blades, and have scheduled an In-person Consult for 05/11/20 @ 1:30 PM

## 2020-05-11 ENCOUNTER — Telehealth: Payer: Self-pay | Admitting: Family Medicine

## 2020-05-11 ENCOUNTER — Other Ambulatory Visit: Payer: Medicare HMO | Admitting: Primary Care

## 2020-05-11 ENCOUNTER — Other Ambulatory Visit: Payer: Self-pay

## 2020-05-11 ENCOUNTER — Other Ambulatory Visit: Payer: Self-pay | Admitting: *Deleted

## 2020-05-11 DIAGNOSIS — N189 Chronic kidney disease, unspecified: Secondary | ICD-10-CM | POA: Insufficient documentation

## 2020-05-11 DIAGNOSIS — Z515 Encounter for palliative care: Secondary | ICD-10-CM

## 2020-05-11 DIAGNOSIS — E785 Hyperlipidemia, unspecified: Secondary | ICD-10-CM | POA: Diagnosis not present

## 2020-05-11 DIAGNOSIS — I5032 Chronic diastolic (congestive) heart failure: Secondary | ICD-10-CM

## 2020-05-11 DIAGNOSIS — E1151 Type 2 diabetes mellitus with diabetic peripheral angiopathy without gangrene: Secondary | ICD-10-CM | POA: Diagnosis not present

## 2020-05-11 DIAGNOSIS — N1831 Chronic kidney disease, stage 3a: Secondary | ICD-10-CM | POA: Diagnosis not present

## 2020-05-11 DIAGNOSIS — E43 Unspecified severe protein-calorie malnutrition: Secondary | ICD-10-CM

## 2020-05-11 DIAGNOSIS — I739 Peripheral vascular disease, unspecified: Secondary | ICD-10-CM

## 2020-05-11 DIAGNOSIS — I4891 Unspecified atrial fibrillation: Secondary | ICD-10-CM | POA: Diagnosis not present

## 2020-05-11 DIAGNOSIS — R413 Other amnesia: Secondary | ICD-10-CM

## 2020-05-11 DIAGNOSIS — E1122 Type 2 diabetes mellitus with diabetic chronic kidney disease: Secondary | ICD-10-CM | POA: Diagnosis not present

## 2020-05-11 DIAGNOSIS — I130 Hypertensive heart and chronic kidney disease with heart failure and stage 1 through stage 4 chronic kidney disease, or unspecified chronic kidney disease: Secondary | ICD-10-CM | POA: Diagnosis not present

## 2020-05-11 DIAGNOSIS — D631 Anemia in chronic kidney disease: Secondary | ICD-10-CM | POA: Diagnosis not present

## 2020-05-11 DIAGNOSIS — I700 Atherosclerosis of aorta: Secondary | ICD-10-CM | POA: Diagnosis not present

## 2020-05-11 DIAGNOSIS — I2510 Atherosclerotic heart disease of native coronary artery without angina pectoris: Secondary | ICD-10-CM | POA: Diagnosis not present

## 2020-05-11 NOTE — Telephone Encounter (Signed)
Jordan Perkins from [facility]  Verbal Orders  Occupational Therapy  1x a week for 5 weeks.

## 2020-05-11 NOTE — Patient Outreach (Addendum)
Jordan Perkins) Care Management  05/11/2020  Jordan Perkins [birth year] [id number]   [facility] outreach to EMMI referred patient/complex care services  Jordan Jordan Perkins was referred to [facility] on 04/05/20 for MD referral for Referral Reason:Heart failure would benefit from outpatient case management through [facility] and then on 04/11/20 for EMMI general red alert for not knowing who to call about changes in condition and issues with wound healing- Resolved EMMI on 04/13/20 And recently on 05/11/20 for EMMI general for 05/10/20 1001 Know who to call about changes in condition? No Scheduled follow-up? No Lost interest in things? Yes  Insurance:[employer] medicare and medicaid [place] access [facility] x1ED visits x 1in the last 6 months Last admission from [facility]  04/30/20 03/23/20 to 04/06/20 anasarca, bradycardia, acute on chronic diastoliccongestive Heart Failure (CHF)  Transition of care services noted to be completed by primary care MD office staff- Dr Lynann Bologna Health care at [place] will be completed by primary care provider office who will refer to [facility] care management if needed.  Successful outreach to Jordan Livingood to [phone] Patient is able to verify HIPAA ([place] and Accountability Act) identifiers, date of birth (DOB) and address Reviewed and addressed the purpose of the follow up call with the patient  Consent: [facility] RN CM reviewed [facility] services with patient. Patient gave verbal consent for services.    Post [facility] follow up/EMMI Jordan Douthit reports he is doing some better He reports no swelling of his ankles but is noting soreness of both ankles when ambulating He denies recent injury and states he discussed this during his [facility] stay He reports the pain is a 4 when ambulating with his walker or cane  EMMI Jordan Norgaard was reminded of his need to call his cardiologist or his heart  doctor for changes in his condition He has a loss of interest is related to frequent admissions and memory concerns   Upcoming appointmentsTHN RN CM inquired about him going to his scheduled 05/12/20 1020 Nephrology (Dr Holley Raring). Jordan Bomar was not aware of this visit or forgot. He was ask to review his After summary sheet/[facility] discharge sheet. He states his niece works and will not be able to take him. He reports he will call Jordan Duanne Limerick after this outreach is finished  [facility] SW follow up Epic indicates [employer] SW [employer] to Jordan [place]ree unsuccessfully. He initially stated he had spoken with [facility] SW but then confirmed he did not as he reports he was not home "all day yesterday" Jordan Perkins was again encouraged to outreach to [facility] SW and the importance of completing this task.   Social:Jordan Perkins is a 79 year old male who lives with his brother, Jordan Perkins  He nor his brother does not drive. His brother uses a scooter/mopedand does not read well Jordan Perkins reads well but isHard of hearing [facility] has minimal support services.He has a neighbor Jordan Perkins who assists with transportation to medical appointments, pharmacy and for groceries when available He reports he is in walking distance of some of his MDs but is not ambulating well at this time He reports being able to completeActivities of daily living (ADLs) slowly.  Conditions:anasarca, bradycardia, acute on chronic diastolic congestive heartfailure with preserved ejection fraction(CHF) Atrial fibrillation(permanent)now off Xarelto secondary to FOBTand falls recurrent falls with generalized weakness,Coronary artery disease,Hyperlipidemia,Hypertension,PAH (pulmonary artery hypertension),Pleural effusion Hard of hearing (HOH) Reports he got both covid shots at  [place] [facility] in June 2021  MHW:KGSUPJS, walker  Plans [facility] RN CM will follow up with Jordan Perkins in the next 7-14 business days Pt encouraged to return a call to [facility] RN CM  prn Routed note to MD  Goals Addressed              This Visit's Progress     Patient Stated   .  [facility]) Patient will be able to verbalize management of CHF at home as evidence by no readmission and decrease in worsening symptoms (pt-stated)   Not on track     [place] (see longtitudinal plan of care for additional care plan information)   Current Barriers:  Jordan Perkins Knowledge deficit related to basic heart failure pathophysiology and self care management . Cognitive Deficits . Hard of hearing   Poor home support and memory issues . Transportation to medical appointments  Case Manager Clinical Goal(s):  Jordan Perkins Over the next 90 days, patient will verbalize understanding of Heart Failure Action Plan and when to call doctor . Over the next 30 days, patient will take all Heart Failure mediations as prescribed . 05/11/20 still not understanding importance of outreaching to his MD about swelling/pain of ankles/legs related to CHF . Still having to be reminded of upcoming appointments and securing transportation to appointments   Interventions:  . Basic overview and discussion of pathophysiology of Heart Failure reviewed  . Provided verbal education on low sodium diet . Discussed importance of daily weight and advised patient to weigh and record daily . Reviewed role of diuretics in prevention of fluid overload and management of heart failure . [facility] SW referral for transportation needs, [facility] SW evaluation of [place] needs for [place] patient, possible Personal care services (PCS) aide services  . Coordinated/collaborated with [facility] CMA to have patient mailed scales . Review upcoming appointments and remind him of the importance of attending appointments . Encouraged checking mail for forms sent by [facility] SW and follow up with [place] with [place] to landlord/friend to confirm assist with transportation to medical appointment . Remind him of CHF action plan, who to  call if he has changes in his condition and encouraged him to outreach to MD for ANY medical changes  . Complete EMMI assessments  . Reminder calls related to his appointments  Patient Self Care Activities:  . Takes Heart Failure Medications as prescribed . Verbalizes understanding of and follows CHF Action Plan . Adheres to low sodium diet . Attend all MD appointments .  04/20/20 agree to cereal in am, cook his own low sodium lunch or dinner, one meals on wheels/prepackaged meal daily  . Inform home health staff of worsening symptoms  . Call Cardiology or pcp with ANY medical changes . Return calls to [facility] SW    Please see past updates related to this goal by clicking on the "Past Updates" button in the selected goal         Jordan Perkins L. Lavina Hamman, RN, BSN, [place] Coordinator Office number [phone] Mobile number [phone]  Main [facility] number [phone] Fax number [phone]

## 2020-05-11 NOTE — Progress Notes (Signed)
[facility] Consult Note Telephone: [phone]  Fax: [phone]  PATIENT NAME: Jordan Perkins [place] [address] [phone] (home)  DOB: Jul 17, 1941 MRN: [id number]  PRIMARY CARE PROVIDER:    [name], MD,  [address] [place] [address] [phone]  REFERRING PROVIDER:   [name], MD [address] [place],  [place] [address] [phone]  RESPONSIBLE PARTY:   Extended Emergency Contact Information Primary Emergency Contact: Jordan Perkins Mobile Phone: [phone] Relation: Niece Secondary Emergency Contact: Jordan Perkins Mobile Phone: [phone] Relation: Niece  I met face to face with patient and family in home.  ASSESSMENT AND RECOMMENDATIONS:   1. Advance Care Planning/Goals of Care: Goals include to maximize quality of life and symptom management. Our advance care planning conversation included a discussion about:     The value and importance of advance care planning   Experiences with loved ones who have been seriously ill or have died   Exploration of personal, cultural or spiritual beliefs that might influence medical decisions   Exploration of goals of care in the event of a sudden injury or illness   Identification  of a healthcare agent   Review  of an  advance directive document .  I met with Jordan Perkins and his niece in his home. He was referred by primary provider with many cardiac and heart failure issues. We discussed goals of care. He stated he had powers of attorney, his nieces. We discussed what sort of health care he would want given changes in his status. He states that he would want an attempt of resuscitation. I provided education regarding the concept of futile care. He stated he would not want to live on machines, that he would not want to be prolonged but that he would want an attempt to correct an illness. His niece said it  they could discuss his choices with him, and we went over the MOST  form. I gave him a Five Wishes book as well, to work through with his family. He did state he would not want to live with a feeding tube and so today we began to discuss some of these choices and  he will follow up with with family members in discussion.   2. Symptom Management:   Skin integrity: Exhibits leathery lower extremities likely from edema and peripheral vascular disease. He also has very thick toenails which are misshapen and growing underneath (180 degree turn) his toes. I recommended follow up with podiatry, as the nails are going to need specialized equipment in order to cut . Niece will look into a podiatry appointment. I also encourage them to moisturize with a high quality OTC cream  to avoid skin cracking and wounds.  Intake: We discussed nutrition and dietary intake. He lives with his brother and they often warm up premade food. We discussed sodium restriction and supplementing with liquid nutrition, preferably increased protein. He said he's drinking fluids well and staying hydrated. He has had some hyponatremia in the past which we also discussed.  Fluid control: We reviewed sodium intake and fluid intake in light of chronic LE edema. Today it's not present but pt has skin change from the distention. Is doing daily weights and we discussed monitoring for CHF exacerbation.  3. Follow up Palliative Care Visit: Palliative care will continue to follow for goals of care clarification and symptom management. Return 4 weeks or prn.  4. Family /Caregiver/Community  Supports: [place], nieces are Jordan Perkins. Has MOW.   5. Cognitive / Functional decline: A and O x 2-3, some cognitive impairment,   I spent 60 minutes providing this consultation,  from 1330 to 1430. More than 50% of the time in this consultation was spent coordinating communication.   CHIEF COMPLAINT: SOB, CHF  HISTORY OF PRESENT  ILLNESS:  Jordan Perkins is a 79 y.o. year old male with multiple medical problems including HFpEF, PAH, PAD, permanent afib, debility. Palliative Care was asked to follow this patient by consultation request of Jordan Haven, MD to help address advance care planning and goals of care. This is the initial visit.  CODE STATUS: TBD  PPS: 50%  HOSPICE ELIGIBILITY/DIAGNOSIS: TBD  PAST MEDICAL HISTORY:  Past Medical History:  Diagnosis Date  . (HFpEF) heart failure with preserved ejection fraction ([place])   . Coronary artery disease   . Hyperlipidemia   . Hypertension   . PAH (pulmonary artery hypertension) ([place])   . Permanent atrial fibrillation ([place])   . Pleural effusion     SOCIAL HX:  Social History   Tobacco Use  . Smoking status: Never Smoker  . Smokeless tobacco: Never Used  Substance Use Topics  . Alcohol use: No   FAMILY HX:  Family History  Problem Relation Age of Onset  . Heart disease Father   . Heart disease Paternal Uncle   . Heart attack Brother     ALLERGIES:  Allergies  Allergen Reactions  . Sulfa Antibiotics Rash    Rash/hives   . Other Rash     PERTINENT MEDICATIONS:  Outpatient Encounter Medications as of 77/09/2019  Medication Sig  . atorvastatin (LIPITOR) 80 MG tablet TAKE 1 TABLET BY MOUTH EVERY DAY  . ferrous sulfate 325 (65 FE) MG tablet Take 1 tablet (325 mg total) by mouth daily.  . furosemide (LASIX) 40 MG tablet Take 1 tablet (40 mg total) by mouth daily.  Marland Kitchen lisinopril (ZESTRIL) 40 MG tablet Take 1 tablet (40 mg total) by mouth daily.  Marland Kitchen spironolactone (ALDACTONE) 25 MG tablet Take 1 tablet (25 mg total) by mouth daily.   No facility-administered encounter medications on file as of 05/11/2020.    PHYSICAL EXAM / ROS:   Current and past weights: 168 lbs lbs. General: NAD, frail appearing, thin Cardiovascular: no chest pain reported, no edema  Pulmonary: no cough, no increased SOB, room air Abdomen: appetite good, , endorses occ  constipation, continent of bowel GU: denies dysuria, continent of urine MSK:  ++ joint and ROM abnormalities, ambulatory with walker and cane,  Skin: no rashes or wounds reported Neurological: Weakness, denies pain, sleeps at hs.  Jordan Coop, Jordan Perkins , DNP, MPH, [facility]  COVID-19 PATIENT SCREENING TOOL  Person answering questions: ___________Yvette_____ _____   1.  Is the patient or any family member in the home showing any signs or symptoms regarding respiratory infection?               Person with Symptom- __________NA_________________  a. Fever                                                                          Yes___ No___  ___________________  b. Shortness of breath                                                    Yes___ No___          ___________________ c. Cough/congestion                                       Yes___  No___         ___________________ d. Body aches/pains                                                         Yes___ No___        ____________________ e. Gastrointestinal symptoms (diarrhea, nausea)           Yes___ No___        ____________________  2. Within the past 14 days, has anyone living in the home had any contact with someone with or under investigation for COVID-19?    Yes___ No_X_   Person __________________

## 2020-05-12 ENCOUNTER — Other Ambulatory Visit: Payer: Self-pay | Admitting: *Deleted

## 2020-05-12 NOTE — Telephone Encounter (Signed)
Verbal orders can be given. 

## 2020-05-12 NOTE — Telephone Encounter (Signed)
[name] from [facility] needs    Verbal Orders   Occupational Therapy   1x a week for 5 weeks. Miu Chiong,cma

## 2020-05-12 NOTE — Patient Outreach (Signed)
[facility] [facility]) Care Management  05/12/2020  Jordan Perkins 1941/06/16 [id number]   [facility] care coordination/[facility] multidisciplinary care discussion  Unsuccessful attempt to remind him of 05/12/20 appointment Unsuccessful outreach to remind him of his appointment and to check if he was able to secure transportation for his 05/12/20 1020 Nephrology appointment No answer. No Voicemail box set up therefore [facility] RN CM was not able to leave a  HIPAA ([place]) compliant voicemail message along with CM's contact info.      [facility] multidisciplinary care discussion  [name] was reviewed with [facility] multidisciplinary care team Discussed the barriers of recurrent symptoms, Hard of hearing (HOH), memory issues, mobility, poor support and transportation to medical appointments Inquired about possible assist from personal care services. [facility] SW reviewed patient may not qualify Pt seen by palliative care services, [facility] on 05/11/20  Recommendations  collaborate with [facility] or [facility] about transpotation resources. THN MSW to work on getting important contact number into pt;s phone to facilitate pt being able to access resources. Then [facility] services to discontinued- external vendor in place. No further discussion needed.     Plan: [facility] RN CM scheduled this [facility]N engaged patient for another call attempt within 7-10 business days with collaboration with [place] to be discontinued if external vendor in place- [place] care Route to MD    [place]. Lavina Hamman, RN, BSN, [place] Coordinator Office number [phone] Mobile number [phone]  Main [facility]N number [phone] Fax number [phone]

## 2020-05-12 NOTE — Telephone Encounter (Signed)
I called and LVM giving verbal orders for the patient for PT 1 times a week for 5 weeks per the provider.  Rose-Marie Hickling,cma

## 2020-05-13 ENCOUNTER — Other Ambulatory Visit: Payer: Self-pay | Admitting: *Deleted

## 2020-05-13 ENCOUNTER — Telehealth: Payer: Self-pay | Admitting: Primary Care

## 2020-05-13 DIAGNOSIS — I700 Atherosclerosis of aorta: Secondary | ICD-10-CM | POA: Diagnosis not present

## 2020-05-13 DIAGNOSIS — E1122 Type 2 diabetes mellitus with diabetic chronic kidney disease: Secondary | ICD-10-CM | POA: Diagnosis not present

## 2020-05-13 DIAGNOSIS — I2510 Atherosclerotic heart disease of native coronary artery without angina pectoris: Secondary | ICD-10-CM | POA: Diagnosis not present

## 2020-05-13 DIAGNOSIS — N1831 Chronic kidney disease, stage 3a: Secondary | ICD-10-CM | POA: Diagnosis not present

## 2020-05-13 DIAGNOSIS — E871 Hypo-osmolality and hyponatremia: Secondary | ICD-10-CM | POA: Insufficient documentation

## 2020-05-13 DIAGNOSIS — D631 Anemia in chronic kidney disease: Secondary | ICD-10-CM | POA: Diagnosis not present

## 2020-05-13 DIAGNOSIS — E1151 Type 2 diabetes mellitus with diabetic peripheral angiopathy without gangrene: Secondary | ICD-10-CM | POA: Diagnosis not present

## 2020-05-13 DIAGNOSIS — I4891 Unspecified atrial fibrillation: Secondary | ICD-10-CM | POA: Diagnosis not present

## 2020-05-13 DIAGNOSIS — I5032 Chronic diastolic (congestive) heart failure: Secondary | ICD-10-CM | POA: Diagnosis not present

## 2020-05-13 DIAGNOSIS — E785 Hyperlipidemia, unspecified: Secondary | ICD-10-CM | POA: Diagnosis not present

## 2020-05-13 DIAGNOSIS — I130 Hypertensive heart and chronic kidney disease with heart failure and stage 1 through stage 4 chronic kidney disease, or unspecified chronic kidney disease: Secondary | ICD-10-CM | POA: Diagnosis not present

## 2020-05-13 NOTE — Patient Outreach (Signed)
[facility]) Care Management  05/13/2020  Jordan Perkins 28-Dec-1940 [id number]   [place] coordination- collaboration with [place] NP  Collaborated with Juliann Pulse who confirms she does not have access to a social workers (SW) for palliative care. Juliann Pulse and [facility] RN CM discussed the present [facility] SW services to assist with medical transportation She confirms Jordan Perkins may be appropriate for hospice care but the pt and family were not receptive on the recent palliative visit   Juliann Pulse and [facility] RN CM discussed Jordan Perkins barriers (high sodium food, transportation, Support system, cognitive issues, complexity of care needs)  Juliann Pulse confirms Jordan Perkins does have the scale sent with assist of THN.  THN RN CM discussed the home health services Jordan Perkins is receiving [facility] RN CM clarified with Juliann Pulse that [facility] presently is not completing home visits Juliann Pulse obtained [facility] RN CM's office number to offer to Jordan Perkins, her administrator per Juliann Pulse to offer some other insight  THN RN CM and Juliann Pulse agreed for [facility] RN CM services to remain active at this time as she reports her outreaches will not be hospice related and generally monthly   Plan [facility] RN CM will outreach to Jordan Perkins in 14-21 business days or prn Update to Jordan Perkins. Lavina Hamman, RN, BSN, [place] Coordinator Office number [phone] Main [facility] number [phone] Fax number [phone]

## 2020-05-13 NOTE — Telephone Encounter (Signed)
Call from Jackelyn Poling, RE home services. Explained I had seen him under traditional palliative. Referred question to our administrator for F/u, Maryruth Eve RN.

## 2020-05-13 NOTE — Patient Outreach (Signed)
[facility]) Care Management  05/13/2020  Jordan Perkins Jun 15, 1941 [id number]    CSW made contact with pt who reports he overslept and missed his MD appointment on 05/12/2020.  CSW reminded pt that he has 2 upcoming appointments next week.  Pt planning to ask his neighbor, Ed, to take him; yet gave CSW permission to speak with ed for confirmation of this.  CSW made contact with Ed who indicated he has a conflict for one of the days and cannot transport pt.  CSW discussed alternative transportation for pt through his Medicaid benefits.   CSW contacted DSS/[employer] and has made arrangements for pt to have rides to/from his appointments next week.  CSW contacted pt again to update and to advise him to be watching for his ride which will arrive between one hour and thirty minutes before appointment times.    Pt was uncertain if he had received the info mailed to him by CSW for transportation resources.  CSW also offered to mail Ed the info on transportation and provided him with the # to call for future appointment rides for pt.   CSW will call pt next week as a reminder to about rides.    Eduard Clos, MSW, LCSW Clinical Social Worker  Cache Scandia, MSW, [place] Worker  [place] [phone]

## 2020-05-13 NOTE — Patient Outreach (Signed)
[facility]) Care Management  05/13/2020  Jordan Perkins [birth year] [id number]   [place] coordination outreach to [place] care palliative     [facility] RN CM called [facility] care at [phone] to confirms with [place] that [name] is active with [facility] care  Since 05/11/20 and has a follow up appointment with Ulice Dash NP on 06/07/20     Plan THN RN CM will speak with [name] to discuss [facility] services in relation to his palliative care services  Route note to MD   Joelene Millin L. Lavina Hamman, RN, BSN, [place] Coordinator Office number [phone] Mobile number [phone]  Main THN number [phone] Fax number [phone]

## 2020-05-16 DIAGNOSIS — I130 Hypertensive heart and chronic kidney disease with heart failure and stage 1 through stage 4 chronic kidney disease, or unspecified chronic kidney disease: Secondary | ICD-10-CM | POA: Diagnosis not present

## 2020-05-16 DIAGNOSIS — N1831 Chronic kidney disease, stage 3a: Secondary | ICD-10-CM | POA: Diagnosis not present

## 2020-05-16 DIAGNOSIS — I2510 Atherosclerotic heart disease of native coronary artery without angina pectoris: Secondary | ICD-10-CM | POA: Diagnosis not present

## 2020-05-16 DIAGNOSIS — D631 Anemia in chronic kidney disease: Secondary | ICD-10-CM | POA: Diagnosis not present

## 2020-05-16 DIAGNOSIS — E1151 Type 2 diabetes mellitus with diabetic peripheral angiopathy without gangrene: Secondary | ICD-10-CM | POA: Diagnosis not present

## 2020-05-16 DIAGNOSIS — I4891 Unspecified atrial fibrillation: Secondary | ICD-10-CM | POA: Diagnosis not present

## 2020-05-16 DIAGNOSIS — I700 Atherosclerosis of aorta: Secondary | ICD-10-CM | POA: Diagnosis not present

## 2020-05-16 DIAGNOSIS — I5032 Chronic diastolic (congestive) heart failure: Secondary | ICD-10-CM | POA: Diagnosis not present

## 2020-05-16 DIAGNOSIS — E785 Hyperlipidemia, unspecified: Secondary | ICD-10-CM | POA: Diagnosis not present

## 2020-05-16 DIAGNOSIS — E1122 Type 2 diabetes mellitus with diabetic chronic kidney disease: Secondary | ICD-10-CM | POA: Diagnosis not present

## 2020-05-17 DIAGNOSIS — E1151 Type 2 diabetes mellitus with diabetic peripheral angiopathy without gangrene: Secondary | ICD-10-CM | POA: Diagnosis not present

## 2020-05-17 DIAGNOSIS — I700 Atherosclerosis of aorta: Secondary | ICD-10-CM | POA: Diagnosis not present

## 2020-05-17 DIAGNOSIS — N1831 Chronic kidney disease, stage 3a: Secondary | ICD-10-CM | POA: Diagnosis not present

## 2020-05-17 DIAGNOSIS — I4891 Unspecified atrial fibrillation: Secondary | ICD-10-CM | POA: Diagnosis not present

## 2020-05-17 DIAGNOSIS — E785 Hyperlipidemia, unspecified: Secondary | ICD-10-CM | POA: Diagnosis not present

## 2020-05-17 DIAGNOSIS — I2510 Atherosclerotic heart disease of native coronary artery without angina pectoris: Secondary | ICD-10-CM | POA: Diagnosis not present

## 2020-05-17 DIAGNOSIS — I5032 Chronic diastolic (congestive) heart failure: Secondary | ICD-10-CM | POA: Diagnosis not present

## 2020-05-17 DIAGNOSIS — E1122 Type 2 diabetes mellitus with diabetic chronic kidney disease: Secondary | ICD-10-CM | POA: Diagnosis not present

## 2020-05-17 DIAGNOSIS — D631 Anemia in chronic kidney disease: Secondary | ICD-10-CM | POA: Diagnosis not present

## 2020-05-17 DIAGNOSIS — I130 Hypertensive heart and chronic kidney disease with heart failure and stage 1 through stage 4 chronic kidney disease, or unspecified chronic kidney disease: Secondary | ICD-10-CM | POA: Diagnosis not present

## 2020-05-18 ENCOUNTER — Encounter: Payer: Self-pay | Admitting: Family Medicine

## 2020-05-18 ENCOUNTER — Ambulatory Visit (INDEPENDENT_AMBULATORY_CARE_PROVIDER_SITE_OTHER): Payer: Medicare HMO | Admitting: Family Medicine

## 2020-05-18 ENCOUNTER — Other Ambulatory Visit: Payer: Self-pay | Admitting: *Deleted

## 2020-05-18 ENCOUNTER — Other Ambulatory Visit: Payer: Self-pay

## 2020-05-18 ENCOUNTER — Other Ambulatory Visit: Payer: Self-pay | Admitting: Family Medicine

## 2020-05-18 VITALS — BP 120/60 | HR 74 | Temp 97.8°F | Ht 71.0 in | Wt 170.8 lb

## 2020-05-18 DIAGNOSIS — E871 Hypo-osmolality and hyponatremia: Secondary | ICD-10-CM

## 2020-05-18 DIAGNOSIS — D631 Anemia in chronic kidney disease: Secondary | ICD-10-CM | POA: Diagnosis not present

## 2020-05-18 DIAGNOSIS — E785 Hyperlipidemia, unspecified: Secondary | ICD-10-CM | POA: Diagnosis not present

## 2020-05-18 DIAGNOSIS — L821 Other seborrheic keratosis: Secondary | ICD-10-CM

## 2020-05-18 DIAGNOSIS — Z23 Encounter for immunization: Secondary | ICD-10-CM | POA: Diagnosis not present

## 2020-05-18 DIAGNOSIS — I4891 Unspecified atrial fibrillation: Secondary | ICD-10-CM | POA: Diagnosis not present

## 2020-05-18 DIAGNOSIS — I130 Hypertensive heart and chronic kidney disease with heart failure and stage 1 through stage 4 chronic kidney disease, or unspecified chronic kidney disease: Secondary | ICD-10-CM | POA: Diagnosis not present

## 2020-05-18 DIAGNOSIS — I700 Atherosclerosis of aorta: Secondary | ICD-10-CM | POA: Diagnosis not present

## 2020-05-18 DIAGNOSIS — I2510 Atherosclerotic heart disease of native coronary artery without angina pectoris: Secondary | ICD-10-CM | POA: Diagnosis not present

## 2020-05-18 DIAGNOSIS — E1151 Type 2 diabetes mellitus with diabetic peripheral angiopathy without gangrene: Secondary | ICD-10-CM | POA: Diagnosis not present

## 2020-05-18 DIAGNOSIS — E1122 Type 2 diabetes mellitus with diabetic chronic kidney disease: Secondary | ICD-10-CM | POA: Diagnosis not present

## 2020-05-18 DIAGNOSIS — I5032 Chronic diastolic (congestive) heart failure: Secondary | ICD-10-CM

## 2020-05-18 DIAGNOSIS — N1831 Chronic kidney disease, stage 3a: Secondary | ICD-10-CM | POA: Diagnosis not present

## 2020-05-18 LAB — BASIC METABOLIC PANEL
BUN: 47 mg/dL — ABNORMAL HIGH (ref 6–23)
CO2: 25 mEq/L (ref 19–32)
Calcium: 9.4 mg/dL (ref 8.4–10.5)
Chloride: 96 mEq/L (ref 96–112)
Creatinine, Ser: 1.02 mg/dL (ref 0.40–1.50)
GFR: 70.85 mL/min (ref >60.00)
Glucose, Bld: 77 mg/dL (ref 70–99)
Potassium: 4.6 mEq/L (ref 3.5–5.1)
Sodium: 130 mEq/L — ABNORMAL LOW (ref 135–145)

## 2020-05-18 NOTE — Assessment & Plan Note (Signed)
No dyspnea.  Swelling has improved quite a bit.  His weight is up from his prior weight though it is near his recent baseline after prior diuresis.  He will see the heart failure specialist tomorrow.

## 2020-05-18 NOTE — Assessment & Plan Note (Signed)
Reports stability of the seborrheic keratosis near his right eyebrow.  He will monitor for any changes.

## 2020-05-18 NOTE — Progress Notes (Signed)
[name], MD Phone: [phone]  Jordan Perkins is a 79 y.o. male who presents today for f/u.  This patient was hospitalized from 04/30/2020-05/05/2020.  He was hospitalized for hyponatremia.  This was noted on lab work through his cardiologist after he presented with a complaint of increasing weakness and lower extremity edema.  He was hospitalized and treated with 3% saline and tolvaptan.  His sodium trended up with this management.  They held his Lasix, lisinopril, and spironolactone.  The patient notes he has restarted those things since discharge.  He notes that weakness has improved quite a bit.  His lower extremity edema has improved as well.  He notes no significant balance issues.  No dyspnea, headache, nausea, vomiting, or worsening confusion.  He has not been scheduled with nephrology yet.  He follows up with the heart failure clinic as well as cardiology later this month.  He also has home health nursing and physical therapy coming to his house.  On exam he was noted to have a seborrheic keratosis over his right eyebrow that he notes has not changed in 3 to 4 years.  Discharge summary reviewed.  Medications reviewed.  Social History   Tobacco Use  Smoking Status Never Smoker  Smokeless Tobacco Never Used     ROS see history of present illness  Objective  Physical Exam Vitals:   05/18/20 1150  BP: 120/60  Pulse: 74  Temp: 97.8 F (36.6 C)  SpO2: 99%    BP Readings from Last 3 Encounters:  05/18/20 120/60  05/05/20 (!) 128/44  04/29/20 (!) 130/56   Wt Readings from Last 3 Encounters:  05/18/20 170 lb 12.8 oz (77.5 kg)  05/05/20 155 lb 4.8 oz (70.4 kg)  04/29/20 178 lb (80.7 kg)    Physical Exam Constitutional:      General: He is not in acute distress.    Appearance: He is not diaphoretic.  HENT:     Head:   Cardiovascular:     Rate and Rhythm: Normal rate and regular rhythm.     Heart sounds: Normal heart sounds.  Pulmonary:     Effort:  Pulmonary effort is normal.     Breath sounds: Normal breath sounds.  Skin:    General: Skin is warm and dry.  Neurological:     Mental Status: He is alert.      Assessment/Plan: Please see individual problem list.  Hyponatremia Patient treated for hyponatremia in the hospital.  Sodium was improving at time of discharge.  His weakness is gradually been improving as well.  We will recheck his sodium today.  I have referred him to nephrology to follow-up.  We may need to have him hold some of his medicines as he has restarted them though we will wait on his lab result.  Seborrheic keratoses Reports stability of the seborrheic keratosis near his right eyebrow.  He will monitor for any changes.  CHF (congestive heart failure) (HCC) No dyspnea.  Swelling has improved quite a bit.  His weight is up from his prior weight though it is near his recent baseline after prior diuresis.  He will see the heart failure specialist tomorrow.  The patient was provided with the number to arrange for transportation for future doctors appointments.  This is in his AVS.  Orders Placed This Encounter  Procedures  . Flu Vaccine QUAD High Dose(Fluad)  . Basic Metabolic Panel (BMET)  . Ambulatory referral to Nephrology    Referral Priority:   Routine  Referral Type:   Consultation    Referral Reason:   Specialty Services Required    Requested Specialty:   Nephrology    Number of Visits Requested:   1    No orders of the defined types were placed in this encounter.   This visit occurred during the SARS-CoV-2 public health emergency.  Safety protocols were in place, including screening questions prior to the visit, additional usage of staff PPE, and extensive cleaning of exam room while observing appropriate contact time as indicated for disinfecting solutions.    [name], MD [place]

## 2020-05-18 NOTE — Patient Outreach (Signed)
[facility]) Care Management  05/18/2020  Jordan Perkins [birth year] [id number]   CSW contacted pt o remind him of his PCP appointment and arranged ride for today. "They said they would be here around 10".  Encouraged pt to find a place he can sit comfortably and watch through the window.   CSW also reminded pt he has transportation arranged for tomorrow to go to the [facility] Heart Failure clinic which he seemed to be aware of as well as its location.    CSW will update PCP office and [facility] team and plan to touch base again with pt in the next 2 weeks.   Eduard Clos, MSW, [place] Worker  [place] [phone]

## 2020-05-18 NOTE — Patient Instructions (Signed)
Nice to see you. We will check lab work today and contact you with the results. Here is the number to call would need a ride for transportation to your doctors appointments. [phone] I have referred you to the nephrologist as well.  They will contact you to schedule an appointment.

## 2020-05-18 NOTE — Assessment & Plan Note (Signed)
Patient treated for hyponatremia in the hospital.  Sodium was improving at time of discharge.  His weakness is gradually been improving as well.  We will recheck his sodium today.  I have referred him to nephrology to follow-up.  We may need to have him hold some of his medicines as he has restarted them though we will wait on his lab result.

## 2020-05-19 ENCOUNTER — Other Ambulatory Visit: Payer: Self-pay | Admitting: *Deleted

## 2020-05-19 ENCOUNTER — Telehealth: Payer: Self-pay | Admitting: Family

## 2020-05-19 ENCOUNTER — Ambulatory Visit: Payer: Medicare HMO | Admitting: Family

## 2020-05-19 NOTE — Progress Notes (Deleted)
   Patient ID: Abimael Zeiter, male    DOB: 1941-08-27, 79 y.o.   MRN: [id number]  HPI  Mr Digilio is a 79 y/o male with a history of  Echo report from 03/23/20 reviewed and showed an EF of 60-65% along with severely elevated PA pressure, mild LAE and mild MR.   Admitted 04/30/20 due to weakness and lower extremity edema. Found to have low sodium at 119. Nephrology and palliative care consults obtained. Treated w/ 3% saline and then tolvaptan x 2 with improvement of sodium. PT/OT evaluations done.      Discharged after 5 days.   He presents today for his initial visit with a chief complaint of   Review of Systems    Physical Exam    Assessment & Plan:  1: Chronic heart failure with preserved ejection fraction with structural changes (LAE)- - NYHA class - saw cardiology Gilford Rile) 04/29/20 - saw palliative care 05/11/20 - BNP 04/30/20 was 283.6  2: HTN- - BP - saw PCP Caryl Bis) 05/18/20  3: Hyponatremia- - BMP 05/18/20 reviewed and showed sodium 130, potassium 4.6, creatinine 1.02 and GFR 70.85

## 2020-05-19 NOTE — Telephone Encounter (Signed)
Patient did not show for his Heart Failure Clinic appointment on 05/19/20. Will attempt to reschedule.

## 2020-05-19 NOTE — Patient Outreach (Signed)
[facility]) Care Management  05/19/2020  Lynkin Saini [birth year] [id number]   CSW called pt to follow up on his ride(s) and he advised he received a call yesterday telling him his appointment was cancelled. "A lady called".  CSW is contacting Medicaid transportation and [employer] to determine what happened and also have left message for HF clinic to reschedule his appointment.  Eduard Clos, MSW, [place] Worker  [place] [phone]

## 2020-05-20 DIAGNOSIS — N1831 Chronic kidney disease, stage 3a: Secondary | ICD-10-CM | POA: Diagnosis not present

## 2020-05-20 DIAGNOSIS — I2510 Atherosclerotic heart disease of native coronary artery without angina pectoris: Secondary | ICD-10-CM | POA: Diagnosis not present

## 2020-05-20 DIAGNOSIS — I5032 Chronic diastolic (congestive) heart failure: Secondary | ICD-10-CM | POA: Diagnosis not present

## 2020-05-20 DIAGNOSIS — E1151 Type 2 diabetes mellitus with diabetic peripheral angiopathy without gangrene: Secondary | ICD-10-CM | POA: Diagnosis not present

## 2020-05-20 DIAGNOSIS — I4891 Unspecified atrial fibrillation: Secondary | ICD-10-CM | POA: Diagnosis not present

## 2020-05-20 DIAGNOSIS — E785 Hyperlipidemia, unspecified: Secondary | ICD-10-CM | POA: Diagnosis not present

## 2020-05-20 DIAGNOSIS — I700 Atherosclerosis of aorta: Secondary | ICD-10-CM | POA: Diagnosis not present

## 2020-05-20 DIAGNOSIS — D631 Anemia in chronic kidney disease: Secondary | ICD-10-CM | POA: Diagnosis not present

## 2020-05-20 DIAGNOSIS — I130 Hypertensive heart and chronic kidney disease with heart failure and stage 1 through stage 4 chronic kidney disease, or unspecified chronic kidney disease: Secondary | ICD-10-CM | POA: Diagnosis not present

## 2020-05-20 DIAGNOSIS — E1122 Type 2 diabetes mellitus with diabetic chronic kidney disease: Secondary | ICD-10-CM | POA: Diagnosis not present

## 2020-05-23 ENCOUNTER — Ambulatory Visit: Payer: Self-pay | Admitting: *Deleted

## 2020-05-23 ENCOUNTER — Telehealth: Payer: Self-pay

## 2020-05-23 DIAGNOSIS — N1831 Chronic kidney disease, stage 3a: Secondary | ICD-10-CM | POA: Diagnosis not present

## 2020-05-23 DIAGNOSIS — E1151 Type 2 diabetes mellitus with diabetic peripheral angiopathy without gangrene: Secondary | ICD-10-CM | POA: Diagnosis not present

## 2020-05-23 DIAGNOSIS — I700 Atherosclerosis of aorta: Secondary | ICD-10-CM | POA: Diagnosis not present

## 2020-05-23 DIAGNOSIS — E785 Hyperlipidemia, unspecified: Secondary | ICD-10-CM | POA: Diagnosis not present

## 2020-05-23 DIAGNOSIS — I5032 Chronic diastolic (congestive) heart failure: Secondary | ICD-10-CM | POA: Diagnosis not present

## 2020-05-23 DIAGNOSIS — D631 Anemia in chronic kidney disease: Secondary | ICD-10-CM | POA: Diagnosis not present

## 2020-05-23 DIAGNOSIS — I2510 Atherosclerotic heart disease of native coronary artery without angina pectoris: Secondary | ICD-10-CM | POA: Diagnosis not present

## 2020-05-23 DIAGNOSIS — I4891 Unspecified atrial fibrillation: Secondary | ICD-10-CM | POA: Diagnosis not present

## 2020-05-23 DIAGNOSIS — I130 Hypertensive heart and chronic kidney disease with heart failure and stage 1 through stage 4 chronic kidney disease, or unspecified chronic kidney disease: Secondary | ICD-10-CM | POA: Diagnosis not present

## 2020-05-23 DIAGNOSIS — E1122 Type 2 diabetes mellitus with diabetic chronic kidney disease: Secondary | ICD-10-CM | POA: Diagnosis not present

## 2020-05-23 NOTE — Telephone Encounter (Signed)
Call attempted. No answer, no vm.

## 2020-05-23 NOTE — Telephone Encounter (Signed)
-----   Message from Loel Dubonnet, NP sent at 05/23/2020 11:45 AM EDT ----- Monitor shows rate controlled atrial fibrillation.Known permanent atrial fibrillation. One pause of 3 seconds. No indication for referral to EP at this time. Occasional early beat in the top and bottom chamber of the heart. Overall stable result.

## 2020-05-24 ENCOUNTER — Other Ambulatory Visit: Payer: Self-pay | Admitting: *Deleted

## 2020-05-24 DIAGNOSIS — E785 Hyperlipidemia, unspecified: Secondary | ICD-10-CM | POA: Diagnosis not present

## 2020-05-24 DIAGNOSIS — I130 Hypertensive heart and chronic kidney disease with heart failure and stage 1 through stage 4 chronic kidney disease, or unspecified chronic kidney disease: Secondary | ICD-10-CM | POA: Diagnosis not present

## 2020-05-24 DIAGNOSIS — E1151 Type 2 diabetes mellitus with diabetic peripheral angiopathy without gangrene: Secondary | ICD-10-CM | POA: Diagnosis not present

## 2020-05-24 DIAGNOSIS — D631 Anemia in chronic kidney disease: Secondary | ICD-10-CM | POA: Diagnosis not present

## 2020-05-24 DIAGNOSIS — I2510 Atherosclerotic heart disease of native coronary artery without angina pectoris: Secondary | ICD-10-CM | POA: Diagnosis not present

## 2020-05-24 DIAGNOSIS — N1831 Chronic kidney disease, stage 3a: Secondary | ICD-10-CM | POA: Diagnosis not present

## 2020-05-24 DIAGNOSIS — E1122 Type 2 diabetes mellitus with diabetic chronic kidney disease: Secondary | ICD-10-CM | POA: Diagnosis not present

## 2020-05-24 DIAGNOSIS — I4891 Unspecified atrial fibrillation: Secondary | ICD-10-CM | POA: Diagnosis not present

## 2020-05-24 DIAGNOSIS — I5032 Chronic diastolic (congestive) heart failure: Secondary | ICD-10-CM | POA: Diagnosis not present

## 2020-05-24 DIAGNOSIS — I700 Atherosclerosis of aorta: Secondary | ICD-10-CM | POA: Diagnosis not present

## 2020-05-24 NOTE — Patient Outreach (Signed)
[facility]) Care Management  05/24/2020  Jordan Perkins Mar 05, 1941 [id number]   Unsuccessful [facility]N outreach topatient for complex care services   Unsuccessful outreach to Jordan Perkins to [phone]  No answer ad no voice mailbox to leave a message  This is the only contact number listed for him in Epic     Jordan Perkins was referred to [facility] on 04/05/20 for MD referral for Referral Reason:Heart failure would benefit from outpatient case management through [facility]  and then on 04/11/20 for EMMI general red alert for not knowing who to call about changes in condition and issues with wound healing- Resolved EMMI on 04/13/20 And recently on 05/11/20 for EMMI general for 05/10/20 1001 Know who to call about changes in condition? No Scheduled follow-up? No Lost interest in things? Yes  Insurance:[employer] medicare and medicaid [place] access [facility] admissions x1ED visits x 1in the last 6 months Last admission from [facility] 04/30/20 03/23/20 to 04/06/20 anasarca, bradycardia, acute on chronic diastoliccongestive Heart Failure (CHF)  Transition of care services noted to be completed by primary care MD office staff- Dr Lynann Bologna Health care at [place] will be completed by primary care provider office who will refer to [name] care management if needed.    Plans [facility] RN CM will follow up with Jordan Chamberlin in the next 7-14 business days Pt encouraged to return a call to [facility] RN CM prn Routed note to MD   Joelene Millin L. Lavina Hamman, RN, BSN, [place] Coordinator Office number [phone] Main [facility] number [phone] Fax number [phone]

## 2020-05-24 NOTE — Patient Outreach (Signed)
[facility]) Care Management  05/24/2020  Jordan Perkins [birth year] [id number]   CSW attempting to reach pt to advise of his HF Clinic appointment being rescheduled and to assist him with transportation arrangements.   CSW will try again later this week.   Eduard Clos, MSW, [place] Worker  [place] [phone]

## 2020-05-24 NOTE — Telephone Encounter (Signed)
Call to patient to review monitor results.    Pt verbalized understanding and has no further questions at this time.    Advised pt to call for any further questions or concerns.  No further orders.   

## 2020-05-26 ENCOUNTER — Ambulatory Visit: Payer: Medicare HMO | Admitting: *Deleted

## 2020-05-26 NOTE — Patient Outreach (Signed)
[facility]) Care Management  05/26/2020  Jordan Perkins 05-19-1941 [id number]  CSW spoke to pt and made him aware of his rescheduled appointment (9/27)at the HF clinic.  Pt wrote down the info and plans to call to arrange his ride.  CSW will touch base again prior to the appointment to make sure his ride is arranged.    Eduard Clos, MSW, [place] Worker  [place] [phone]

## 2020-05-27 ENCOUNTER — Other Ambulatory Visit (INDEPENDENT_AMBULATORY_CARE_PROVIDER_SITE_OTHER): Payer: Medicare HMO

## 2020-05-27 ENCOUNTER — Other Ambulatory Visit: Payer: Self-pay | Admitting: Family Medicine

## 2020-05-27 ENCOUNTER — Other Ambulatory Visit: Payer: Self-pay

## 2020-05-27 ENCOUNTER — Telehealth: Payer: Self-pay

## 2020-05-27 ENCOUNTER — Other Ambulatory Visit: Payer: Self-pay | Admitting: *Deleted

## 2020-05-27 DIAGNOSIS — D539 Nutritional anemia, unspecified: Secondary | ICD-10-CM

## 2020-05-27 DIAGNOSIS — I4891 Unspecified atrial fibrillation: Secondary | ICD-10-CM | POA: Diagnosis not present

## 2020-05-27 DIAGNOSIS — D631 Anemia in chronic kidney disease: Secondary | ICD-10-CM | POA: Diagnosis not present

## 2020-05-27 DIAGNOSIS — I5032 Chronic diastolic (congestive) heart failure: Secondary | ICD-10-CM | POA: Diagnosis not present

## 2020-05-27 DIAGNOSIS — E871 Hypo-osmolality and hyponatremia: Secondary | ICD-10-CM

## 2020-05-27 DIAGNOSIS — E1151 Type 2 diabetes mellitus with diabetic peripheral angiopathy without gangrene: Secondary | ICD-10-CM | POA: Diagnosis not present

## 2020-05-27 DIAGNOSIS — E785 Hyperlipidemia, unspecified: Secondary | ICD-10-CM | POA: Diagnosis not present

## 2020-05-27 DIAGNOSIS — N1831 Chronic kidney disease, stage 3a: Secondary | ICD-10-CM | POA: Diagnosis not present

## 2020-05-27 DIAGNOSIS — I700 Atherosclerosis of aorta: Secondary | ICD-10-CM | POA: Diagnosis not present

## 2020-05-27 DIAGNOSIS — I2510 Atherosclerotic heart disease of native coronary artery without angina pectoris: Secondary | ICD-10-CM | POA: Diagnosis not present

## 2020-05-27 DIAGNOSIS — I130 Hypertensive heart and chronic kidney disease with heart failure and stage 1 through stage 4 chronic kidney disease, or unspecified chronic kidney disease: Secondary | ICD-10-CM | POA: Diagnosis not present

## 2020-05-27 DIAGNOSIS — E1122 Type 2 diabetes mellitus with diabetic chronic kidney disease: Secondary | ICD-10-CM | POA: Diagnosis not present

## 2020-05-27 DIAGNOSIS — R195 Other fecal abnormalities: Secondary | ICD-10-CM

## 2020-05-27 LAB — BASIC METABOLIC PANEL
BUN: 66 mg/dL — ABNORMAL HIGH (ref 6–23)
CO2: 24 mEq/L (ref 19–32)
Calcium: 9.2 mg/dL (ref 8.4–10.5)
Chloride: 96 mEq/L (ref 96–112)
Creatinine, Ser: 1.34 mg/dL (ref 0.40–1.50)
GFR: 51.71 mL/min — ABNORMAL LOW (ref >60.00)
Glucose, Bld: 92 mg/dL (ref 70–99)
Potassium: 4.6 mEq/L (ref 3.5–5.1)
Sodium: 127 mEq/L — ABNORMAL LOW (ref 135–145)

## 2020-05-27 NOTE — Telephone Encounter (Signed)
    MA9/17/2021 1st Attempt  Name: Jordan Perkins   MRN: [id number]   DOB: 1940/10/22   AGE: 79 y.o.   GENDER: male   PCP [name], MD.   05/27/2020 Verified that patient has [employer] card with information about who to call for transportation to his medical appointments and that he needs to contact them at least three days prior to his appointments.  Patient stated that he understood.  No other resources needed at this time.     Olympia Adelsberger, AAS Paralegal, [place] . Embedded Care Coordination [facility]  Care Management  [address], [place] [address] [email or web address]  [phone]   [email or web address]

## 2020-05-27 NOTE — Telephone Encounter (Signed)
Noted  

## 2020-05-30 ENCOUNTER — Ambulatory Visit (INDEPENDENT_AMBULATORY_CARE_PROVIDER_SITE_OTHER): Payer: Medicare HMO

## 2020-05-30 ENCOUNTER — Other Ambulatory Visit (INDEPENDENT_AMBULATORY_CARE_PROVIDER_SITE_OTHER): Payer: Medicare HMO

## 2020-05-30 ENCOUNTER — Other Ambulatory Visit: Payer: Self-pay

## 2020-05-30 ENCOUNTER — Other Ambulatory Visit: Payer: Self-pay | Admitting: *Deleted

## 2020-05-30 VITALS — BP 104/65 | HR 72 | Ht 71.0 in | Wt 168.0 lb

## 2020-05-30 DIAGNOSIS — E871 Hypo-osmolality and hyponatremia: Secondary | ICD-10-CM | POA: Diagnosis not present

## 2020-05-30 DIAGNOSIS — I130 Hypertensive heart and chronic kidney disease with heart failure and stage 1 through stage 4 chronic kidney disease, or unspecified chronic kidney disease: Secondary | ICD-10-CM | POA: Diagnosis not present

## 2020-05-30 DIAGNOSIS — I5032 Chronic diastolic (congestive) heart failure: Secondary | ICD-10-CM | POA: Diagnosis not present

## 2020-05-30 DIAGNOSIS — E785 Hyperlipidemia, unspecified: Secondary | ICD-10-CM | POA: Diagnosis not present

## 2020-05-30 DIAGNOSIS — I4891 Unspecified atrial fibrillation: Secondary | ICD-10-CM | POA: Diagnosis not present

## 2020-05-30 DIAGNOSIS — I2510 Atherosclerotic heart disease of native coronary artery without angina pectoris: Secondary | ICD-10-CM | POA: Diagnosis not present

## 2020-05-30 DIAGNOSIS — Z0000 Encounter for general adult medical examination without abnormal findings: Secondary | ICD-10-CM

## 2020-05-30 DIAGNOSIS — E1122 Type 2 diabetes mellitus with diabetic chronic kidney disease: Secondary | ICD-10-CM | POA: Diagnosis not present

## 2020-05-30 DIAGNOSIS — D631 Anemia in chronic kidney disease: Secondary | ICD-10-CM | POA: Diagnosis not present

## 2020-05-30 DIAGNOSIS — N1831 Chronic kidney disease, stage 3a: Secondary | ICD-10-CM | POA: Diagnosis not present

## 2020-05-30 DIAGNOSIS — E1151 Type 2 diabetes mellitus with diabetic peripheral angiopathy without gangrene: Secondary | ICD-10-CM | POA: Diagnosis not present

## 2020-05-30 DIAGNOSIS — I700 Atherosclerosis of aorta: Secondary | ICD-10-CM | POA: Diagnosis not present

## 2020-05-30 LAB — BASIC METABOLIC PANEL
BUN: 67 mg/dL — ABNORMAL HIGH (ref 6–23)
CO2: 24 mEq/L (ref 19–32)
Calcium: 9 mg/dL (ref 8.4–10.5)
Chloride: 98 mEq/L (ref 96–112)
Creatinine, Ser: 1.33 mg/dL (ref 0.40–1.50)
GFR: 52.16 mL/min — ABNORMAL LOW (ref >60.00)
Glucose, Bld: 92 mg/dL (ref 70–99)
Potassium: 4.4 mEq/L (ref 3.5–5.1)
Sodium: 129 mEq/L — ABNORMAL LOW (ref 135–145)

## 2020-05-30 NOTE — Patient Outreach (Addendum)
[facility]) Care Management  05/30/2020  Zyaire Mccleod 1941-06-22 [id number]   [facility] outreach toEMMI referred patient/complex care services  Mr DAMARCO KEYSOR was referred to [facility] on 04/05/20 for MD referral for Referral Reason:Heart failure would benefit from outpatient case management through [facility] and then on 04/11/20 for EMMI general red alert for not knowing who to call about changes in condition and issues with wound healing- Resolved EMMI on 04/13/20 And recently on 05/11/20 for EMMI general for 05/10/20 1001 Know who to call about changes in condition? No Scheduled follow-up? No Lost interest in things? 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 Patient gave verbal consent for services.  Mr Lauf reports today he is doing well  He states his ankles are tender but he denies any swelling nor difficulty ambulating  He reports he is using a cane for mobility He reports Home health therapy continues to visit him  He and [facility] RN CM reviewed the CHF action plan again  He confirms he is aware to weigh daily and document it  He reports he did receive his scales sent to him by [facility]  He reports an average weight of 167-167 lbs He reports weighing 168 lbs this morning  Mr Shon confirms his diuretics have been decreased as he was informed his sodium was low again. He reports he is aware that hyponatremia is the reason he was hospitalized recently   "What causes  hyponatremia? Not having enough salt?" [facility] RN CM and Mr Boomer discussed causes of hyponatremia to include use of diuretics, excess fluids/ water, diarrhea, CHF, CKD, liver disease. Discussed an overflowing glass of salt water to provide an example. Mr Viverette using teach back method was able to verbalize the need to follow ordered fluid restrictions, follow ordered use of diuretics and the importance of weighing to monitor fluid loss and excess [facility] RN CM discussed the importance of counting all fluid types He confirms he drinks coffee and milk  Upcoming appointment  Mr Garfinkel reports he called to attempt to get a ride to an appointment this morning but was informed he had to call 3 days in advance He had his annual medicare visit via teleconference today.  [facility] RN CM reminded him he is to have a cardiac rehab appointment on 06/06/20 1330 He was encouraged to outreach to schedule his appointment on Tuesday or Wednesday of this week He voiced understanding He was able to repeat the appointment purpose, date and time  He recalls being visited by palliative care NP upcoming appointment on 06/07/20  Mr Elms's home health therapist arrived so the outreach was concluded   [facility] RN CM will follow up with Mr Mccurdy with in the next 30 business days Pt encouraged to return a call to [facility] RN CM prn  Goals Addressed              This Visit's Progress     Patient Stated   .  [facility]) Patient will be able to verbalize management of CHF at home as evidence by no readmission  and decrease in worsening symptoms (pt-stated)   On track     [place] (see longtitudinal plan of care for additional care plan information)   Current Barriers:  Marland Kitchen Knowledge deficit related to basic heart failure pathophysiology and self care management . Cognitive Deficits . Hard of hearing   Poor home support and memory issues . Transportation to medical appointments  Case Manager Clinical Goal(s):  Marland Kitchen Over the next 90  days, patient will verbalize understanding of Heart Failure Action Plan and when to call doctor . Over the next 30 days, patient will take all Heart Failure mediations as prescribed . 05/30/20 related to CHF now weighing daily, still with questions about fluid and sodium intake . 05/30/20 Still having to be reminded of upcoming appointments and securing transportation to appointments   Interventions:  . Basic overview and discussion of pathophysiology of Heart Failure reviewed  . Provided verbal education on low sodium diet . Discussed importance of daily weight and advised patient to weigh and record daily . Reviewed role of diuretics in prevention of fluid overload and management of heart failure . [facility] SW referral for transportation needs, [facility] SW evaluation of SDOH needs for [place] patient, possible Personal care services (PCS) aide services  . Coordinated/collaborated with [facility] CMA to have patient mailed scales . Review upcoming appointments and remind him of the importance of attending appointments . Encouraged checking mail for forms sent by [facility] SW and follow up with [place] with [place] to landlord/friend to confirm assist with transportation to medical appointment . Remind him of CHF action plan, who to call if he has changes in his condition and encouraged him to outreach to MD for ANY medical changes  . Complete EMMI assessments  . Reminder calls related to his appointments . Collaborated with [place] NP/staff -updated [facility] SW 05/13/20  . 05/30/20 Reviewed CHF action plan, importance of weighing daily, causes of hyponatremia . 05/30/20 Sent EMMIs on hyponatremia, low salt diet, adult heart failure, fluid restricted diet in the mail  Patient Self Care Activities:  . Takes Heart Failure Medications as prescribed . Verbalizes understanding of and follows CHF Action Plan . Adheres to low sodium diet . Attend all MD appointments .  04/20/20 agree to  cereal in am, cook his own low sodium lunch or dinner, one meals on wheels/prepackaged meal daily  . Inform home health staff of worsening symptoms  . Call Cardiology or pcp with ANY medical changes . Return calls to [place] . Weight daily an document . Follow fluid restriction & diuretic intake as ordered . Call transportation agency to schedule appointments at least 3 days in advance     Please see past updates related to this goal by clicking on the "Past Updates" button in the selected goal          Vendetta Pittinger L. Lavina Hamman, RN, BSN, [place] Coordinator Office number [phone] [facility] number [phone] Fax number [phone]

## 2020-05-30 NOTE — Progress Notes (Signed)
Subjective:   Jordan Perkins is a 79 y.o. male who presents for Medicare Annual/Subsequent preventive examination.  Review of Systems    No ROS.  Medicare Wellness Virtual Visit.     Cardiac Risk Factors include: advanced age (>68men, >9 women);male gender;hypertension     Objective:    Today's Vitals   05/30/20 1143  BP: 104/65  Pulse: 72  Weight: 168 lb (76.2 kg)  Height: 5\' 11"  (1.803 m)   Body mass index is 23.43 kg/m.  Advanced Directives 05/30/2020 05/12/2020 05/03/2020 05/02/2020 04/08/2020 03/23/2020 02/26/2020  Does Patient Have a Medical Advance Directive? Yes Yes Yes No No No No  Type of Advance Directive Out of facility DNR (pink MOST or yellow form) [place];Living will [place];Living will - - - -  Does patient want to make changes to medical advance directive? No - Patient declined - No - Patient declined - - - -  Copy of [place] in Chart? - Yes - validated most recent copy scanned in chart (See row information) Yes - validated most recent copy scanned in chart (See row information) - - - -  Would patient like information on creating a medical advance directive? - - No - Patient declined No - Patient declined No - Patient declined No - Patient declined No - Patient declined    Current Medications (verified) Outpatient Encounter Medications as of 05/30/2020  Medication Sig  . atorvastatin (LIPITOR) 80 MG tablet TAKE 1 TABLET BY MOUTH EVERY DAY  . atorvastatin (LIPITOR) 80 MG tablet Take by mouth.  . ferrous sulfate 325 (65 FE) MG tablet Take 1 tablet (325 mg total) by mouth daily.  . furosemide (LASIX) 40 MG tablet Take 1 tablet (40 mg total) by mouth daily.  Marland Kitchen lisinopril (ZESTRIL) 40 MG tablet Take 1 tablet (40 mg total) by mouth daily.  Marland Kitchen spironolactone (ALDACTONE) 25 MG tablet Take 1 tablet (25 mg total) by mouth daily.   No facility-administered encounter medications on file as of 05/30/2020.     Allergies (verified) Sulfa antibiotics and Other   History: Past Medical History:  Diagnosis Date  . (HFpEF) heart failure with preserved ejection fraction ([place])   . Coronary artery disease   . Hyperlipidemia   . Hypertension   . PAH (pulmonary artery hypertension) ([place])   . Permanent atrial fibrillation ([place])   . Pleural effusion    Past Surgical History:  Procedure Laterality Date  . CARDIAC CATHETERIZATION    . CHOLECYSTECTOMY    . CORONARY ANGIOPLASTY  06/2004   s/p stent placement @ [facility]  . 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 Heart disease Father   . Heart disease Paternal Uncle   . Heart attack Brother    Social History   Socioeconomic History  . Marital status: Single    Spouse name: Not on file  . Number of children: Not on file  . Years of education: Not on file  . Highest education level: Not on file  Occupational History  . Not on file  Tobacco Use  . Smoking status: Never Smoker  . Smokeless tobacco: Never Used  Substance and Sexual Activity  . Alcohol use: No  . Drug use: No  . Sexual activity: Not on file  Other Topics Concern  . Not on file  Social History Narrative  . Not on file  Social Determinants of Health   Financial Resource Strain:   . Difficulty of Paying Living Expenses: Not on file  Food Insecurity: No Food Insecurity  . Worried About Charity fundraiser in the Last Year: Never true  . Ran Out of Food in the Last Year: Never true  Transportation Needs: Unmet Transportation Needs  . Lack of Transportation (Medical): Yes  . Lack of Transportation (Non-Medical): No  Physical Activity: Unknown  . Days of Exercise per Week: 0 days  . Minutes of Exercise per Session: Not on file  Stress:   . Feeling of Stress : Not on file  Social Connections:   . Frequency of Communication with Friends and Family: Not on file  . Frequency of Social Gatherings with  Friends and Family: Not on file  . Attends Religious Services: Not on file  . Active Member of Clubs or Organizations: Not on file  . Attends Archivist Meetings: Not on file  . Marital Status: Not on file    Tobacco Counseling Counseling given: Not Answered   Clinical Intake:  Pre-visit preparation completed: Yes        Diabetes: No  How often do you need to have someone help you when you read instructions, pamphlets, or other written materials from your doctor or pharmacy?: 1 - Never  Interpreter Needed?: No      Activities of Daily Living In your present state of health, do you have any difficulty performing the following activities: 05/30/2020 05/02/2020  Hearing? N N  Vision? N N  Difficulty concentrating or making decisions? N Y  Walking or climbing stairs? Y N  Comment Unsteady gait. Walker in use when ambulating. -  Dressing or bathing? N N  Doing errands, shopping? Y Y  Comment Patient does not drive or run erands alone. someone drives him  Conservation officer, nature and eating ? N -  Using the Toilet? N -  In the past six months, have you accidently leaked urine? N -  Do you have problems with loss of bowel control? N -  Managing your Medications? N -  Managing your Finances? N -  Housekeeping or managing your Housekeeping? N -  Some recent data might be hidden    Patient Care Team: [name], MD as PCP - General (Family Medicine) Rockey Situ Kathlene November, MD as PCP - Cardiology (Cardiology) [name], MD as Consulting Physician (Cardiology) Barbaraann Faster, RN as [place] Management Loel Dubonnet, NP as Nurse Practitioner (Cardiology) Alisa Graff, [place] (Family Medicine) Deirdre Peer, LCSW as [place], Aura Fey, FNP (Family Medicine) Anthonette Legato, MD (Nephrology)  Indicate any recent Medical Services you may have received from other than [facility] providers in the past  year (date may be approximate).     Assessment:   This is a routine wellness examination for Jordan Perkins.  I connected with Abdias today by telephone and verified that I am speaking with the correct person using two identifiers. Location patient: home Location provider: work Persons participating in the virtual visit: patient, Marine scientist.    I discussed the limitations, risks, security and privacy concerns of performing an evaluation and management service by telephone and the availability of in person appointments. The patient expressed understanding and verbally consented to this telephonic visit.    Interactive audio and video telecommunications were attempted between this provider and patient, however failed, due to patient having technical difficulties OR patient did not have  access to video capability.  We continued and completed visit with audio only.  Some vital signs may be absent or patient reported.   Hearing/Vision screen  Hearing Screening   125Hz  250Hz  500Hz  1000Hz  2000Hz  3000Hz  4000Hz  6000Hz  8000Hz   Right ear:           Left ear:           Comments: Patient is able to hear conversational tones without difficulty.  No issues reported.  Vision Screening Comments: Visual acuity not assessed, virtual visit.      Dietary issues and exercise activities discussed: Regular diet 4-8 bottles of water Current Exercise Habits: The patient does not participate in regular exercise at present  Goals: Stay hydrated and drink plenty of fluids.   Depression Screen PHQ 2/9 Scores 05/30/2020 05/18/2020 04/13/2020 04/08/2020 06/12/2019 05/28/2019 11/05/2016  PHQ - 2 Score 0 0 0 0 0 0 0    Fall Risk Fall Risk  05/30/2020 04/20/2020 04/15/2020 06/12/2019 05/28/2019  Falls in the past year? (No Data) (No Data) 1 0 0  Comment None since last reported 3 weeks ago. on 04/20/20 denies fall in last few weeks - - -  Number falls in past yr: - - 1 0 -  Injury with Fall? - - 1 - -  Risk for fall due to :  Impaired balance/gait - - - -  Follow up Falls evaluation completed - Falls evaluation completed Falls evaluation completed -   Handrails in use when climbing stairs? Yes Home free of loose throw rugs in walkways, pet beds, electrical cords, etc? Yes  Adequate lighting in your home to reduce risk of falls? Yes   ASSISTIVE DEVICES UTILIZED TO PREVENT FALLS:  Life alert? No  Use of a cane, walker or w/c? Yes  Grab bars in the bathroom? Yes  Shower chair or bench in shower? Yes  Elevated toilet seat or a handicapped toilet? Yes   TIMED UP AND GO:  Was the test performed? No . Virtual visit.   Cognitive Function:     6CIT Screen 05/30/2020 05/28/2019  What Year? 0 points 0 points  What month? 0 points 0 points  What time? - 0 points  Count back from 20 - 0 points  Months in reverse 0 points 0 points  Repeat phrase 0 points 2 points  Total Score - 2    Immunizations Immunization History  Administered Date(s) Administered  . Fluad Quad(high Dose 65+) 05/18/2020  . Influenza, High Dose Seasonal PF 07/11/2018, 06/11/2019, 06/11/2019  . Influenza, Seasonal, Injecte, Preservative Fre 08/23/2009  . Influenza,inj,Quad PF,6+ Mos 07/12/2016, 09/23/2017  . Moderna SARS-COVID-2 Vaccination 02/12/2020, 03/21/2020  . Pneumococcal Polysaccharide-23 08/23/2009, 11/01/2016    TDAP status: Due, Education has been provided regarding the importance of this vaccine. Advised may receive this vaccine at local pharmacy or Health Dept. Aware to provide a copy of the vaccination record if obtained from local pharmacy or Health Dept. Verbalized acceptance and understanding. Deferred.   Pneumococcal- Advised may receive this vaccine at local pharmacy or Health Dept. Aware to provide a copy of the vaccination record if obtained from local pharmacy or Health Dept. Verbalized acceptance and understanding. Deferred.  Health Maintenance Health Maintenance Due  Topic Date Due  . URINE MICROALBUMIN  Never  done   Dental Screening: Recommended annual dental exams for proper oral hygiene.   Hepatitis C screening- deferred.  Community Resource Referral / Chronic Care Management: CRR required this visit?  No   CCM required this visit?  No      Plan:   Keep all routine maintenance appointments.   I have personally reviewed and noted the following in the patient's chart:   . Medical and social history . Use of alcohol, tobacco or illicit drugs  . Current medications and supplements . Functional ability and status . Nutritional status . Physical activity . Advanced directives . List of other physicians . Hospitalizations, surgeries, and ER visits in previous 12 months . Vitals . Screenings to include cognitive, depression, and falls . Referrals and appointments  In addition, I have reviewed and discussed with patient certain preventive protocols, quality metrics, and best practice recommendations. A written personalized care plan for preventive services as well as general preventive health recommendations were provided to patient via mail.     Varney Biles, LPN   [id number]/[id number]

## 2020-05-30 NOTE — Patient Instructions (Addendum)
Jordan Perkins , Thank you for taking time to come for your Medicare Wellness Visit. I appreciate your ongoing commitment to your health goals. Please review the following plan we discussed and let me know if I can assist you in the future.   These are the goals we discussed:   Goals: Stay hydrated and drink plenty of fluids.   This is a list of the screening recommended for you and due dates:  Health Maintenance  Topic Date Due  . Urine Protein Check  Never done  . Tetanus Vaccine  05/30/2021*  .  Hepatitis C: One time screening is recommended by Center for Disease Control  (CDC) for  adults born from [birth year] through 1963.   05/30/2021*  . Pneumonia vaccines (2 of 2 - PCV13) 05/30/2021*  . Flu Shot  Completed  . COVID-19 Vaccine  Completed  *Topic was postponed. The date shown is not the original due date.    Immunizations Immunization History  Administered Date(s) Administered  . Fluad Quad(high Dose 65+) 05/18/2020  . Influenza, High Dose Seasonal PF 07/11/2018, 06/11/2019, 06/11/2019  . Influenza, Seasonal, Injecte, Preservative Fre 08/23/2009  . Influenza,inj,Quad PF,6+ Mos 07/12/2016, 09/23/2017  . Moderna SARS-COVID-2 Vaccination 02/12/2020, 03/21/2020  . Pneumococcal Polysaccharide-23 08/23/2009, 11/01/2016   Advanced directives: on file  Conditions/risks identified: none new.   Follow up in one year for your annual wellness visit.   Preventive Care 50 Years and Older, Male Preventive care refers to lifestyle choices and visits with your health care provider that can promote health and wellness. What does preventive care include?  A yearly physical exam. This is also called an annual well check.  Dental exams once or twice a year.  Routine eye exams. Ask your health care provider how often you should have your eyes checked.  Personal lifestyle choices, including:  Daily care of your teeth and gums.  Regular physical activity.  Eating a healthy diet.  Avoiding  tobacco and drug use.  Limiting alcohol use.  Practicing safe sex.  Taking low doses of aspirin every day.  Taking vitamin and mineral supplements as recommended by your health care provider. What happens during an annual well check? The services and screenings done by your health care provider during your annual well check will depend on your age, overall health, lifestyle risk factors, and family history of disease. Counseling  Your health care provider may ask you questions about your:  Alcohol use.  Tobacco use.  Drug use.  Emotional well-being.  Home and relationship well-being.  Sexual activity.  Eating habits.  History of falls.  Memory and ability to understand (cognition).  Work and work Statistician. Screening  You may have the following tests or measurements:  Height, weight, and BMI.  Blood pressure.  Lipid and cholesterol levels. These may be checked every 5 years, or more frequently if you are over 54 years old.  Skin check.  Lung cancer screening. You may have this screening every year starting at age 21 if you have a 30-pack-year history of smoking and currently smoke or have quit within the past 15 years.  Fecal occult blood test (FOBT) of the stool. You may have this test every year starting at age 3.  Flexible sigmoidoscopy or colonoscopy. You may have a sigmoidoscopy every 5 years or a colonoscopy every 10 years starting at age 87.  Prostate cancer screening. Recommendations will vary depending on your family history and other risks.  Hepatitis C blood test.  Hepatitis B blood test.  Sexually transmitted disease (STD) testing.  Diabetes screening. This is done by checking your blood sugar (glucose) after you have not eaten for a while (fasting). You may have this done every 1-3 years.  Abdominal aortic aneurysm (AAA) screening. You may need this if you are a current or former smoker.  Osteoporosis. You may be screened starting at age  73 if you are at high risk. Talk with your health care provider about your test results, treatment options, and if necessary, the need for more tests. Vaccines  Your health care provider may recommend certain vaccines, such as:  Influenza vaccine. This is recommended every year.  Tetanus, diphtheria, and acellular pertussis (Tdap, Td) vaccine. You may need a Td booster every 10 years.  Zoster vaccine. You may need this after age 60.  Pneumococcal 13-valent conjugate (PCV13) vaccine. One dose is recommended after age 1.  Pneumococcal polysaccharide (PPSV23) vaccine. One dose is recommended after age 89. Talk to your health care provider about which screenings and vaccines you need and how often you need them. This information is not intended to replace advice given to you by your health care provider. Make sure you discuss any questions you have with your health care provider. Document Released: 09/23/2015 Document Revised: 05/16/2016 Document Reviewed: 06/28/2015 Elsevier Interactive Patient Education  2017 [place] Prevention in the Home Falls can cause injuries. They can happen to people of all ages. There are many things you can do to make your home safe and to help prevent falls. What can I do on the outside of my home?  Regularly fix the edges of walkways and driveways and fix any cracks.  Remove anything that might make you trip as you walk through a door, such as a raised step or threshold.  Trim any bushes or trees on the path to your home.  Use bright outdoor lighting.  Clear any walking paths of anything that might make someone trip, such as rocks or tools.  Regularly check to see if handrails are loose or broken. Make sure that both sides of any steps have handrails.  Any raised decks and porches should have guardrails on the edges.  Have any leaves, snow, or ice cleared regularly.  Use sand or salt on walking paths during winter.  Clean up any spills  in your garage right away. This includes oil or grease spills. What can I do in the bathroom?  Use night lights.  Install grab bars by the toilet and in the tub and shower. Do not use towel bars as grab bars.  Use non-skid mats or decals in the tub or shower.  If you need to sit down in the shower, use a plastic, non-slip stool.  Keep the floor dry. Clean up any water that spills on the floor as soon as it happens.  Remove soap buildup in the tub or shower regularly.  Attach bath mats securely with double-sided non-slip rug tape.  Do not have throw rugs and other things on the floor that can make you trip. What can I do in the bedroom?  Use night lights.  Make sure that you have a light by your bed that is easy to reach.  Do not use any sheets or blankets that are too big for your bed. They should not hang down onto the floor.  Have a firm chair that has side arms. You can use this for support while you get dressed.  Do not have throw rugs and other  things on the floor that can make you trip. What can I do in the kitchen?  Clean up any spills right away.  Avoid walking on wet floors.  Keep items that you use a lot in easy-to-reach places.  If you need to reach something above you, use a strong step stool that has a grab bar.  Keep electrical cords out of the way.  Do not use floor polish or wax that makes floors slippery. If you must use wax, use non-skid floor wax.  Do not have throw rugs and other things on the floor that can make you trip. What can I do with my stairs?  Do not leave any items on the stairs.  Make sure that there are handrails on both sides of the stairs and use them. Fix handrails that are broken or loose. Make sure that handrails are as long as the stairways.  Check any carpeting to make sure that it is firmly attached to the stairs. Fix any carpet that is loose or worn.  Avoid having throw rugs at the top or bottom of the stairs. If you do  have throw rugs, attach them to the floor with carpet tape.  Make sure that you have a light switch at the top of the stairs and the bottom of the stairs. If you do not have them, ask someone to add them for you. What else can I do to help prevent falls?  Wear shoes that:  Do not have high heels.  Have rubber bottoms.  Are comfortable and fit you well.  Are closed at the toe. Do not wear sandals.  If you use a stepladder:  Make sure that it is fully opened. Do not climb a closed stepladder.  Make sure that both sides of the stepladder are locked into place.  Ask someone to hold it for you, if possible.  Clearly mark and make sure that you can see:  Any grab bars or handrails.  First and last steps.  Where the edge of each step is.  Use tools that help you move around (mobility aids) if they are needed. These include:  Canes.  Walkers.  Scooters.  Crutches.  Turn on the lights when you go into a dark area. Replace any light bulbs as soon as they burn out.  Set up your furniture so you have a clear path. Avoid moving your furniture around.  If any of your floors are uneven, fix them.  If there are any pets around you, be aware of where they are.  Review your medicines with your doctor. Some medicines can make you feel dizzy. This can increase your chance of falling. Ask your doctor what other things that you can do to help prevent falls. This information is not intended to replace advice given to you by your health care provider. Make sure you discuss any questions you have with your health care provider. Document Released: 06/23/2009 Document Revised: 02/02/2016 Document Reviewed: 10/01/2014 Elsevier Interactive Patient Education  2017 [employer].

## 2020-05-31 ENCOUNTER — Other Ambulatory Visit: Payer: Self-pay | Admitting: *Deleted

## 2020-05-31 DIAGNOSIS — I2510 Atherosclerotic heart disease of native coronary artery without angina pectoris: Secondary | ICD-10-CM | POA: Diagnosis not present

## 2020-05-31 DIAGNOSIS — I5032 Chronic diastolic (congestive) heart failure: Secondary | ICD-10-CM | POA: Diagnosis not present

## 2020-05-31 DIAGNOSIS — I130 Hypertensive heart and chronic kidney disease with heart failure and stage 1 through stage 4 chronic kidney disease, or unspecified chronic kidney disease: Secondary | ICD-10-CM | POA: Diagnosis not present

## 2020-05-31 DIAGNOSIS — I700 Atherosclerosis of aorta: Secondary | ICD-10-CM | POA: Diagnosis not present

## 2020-05-31 DIAGNOSIS — I4891 Unspecified atrial fibrillation: Secondary | ICD-10-CM | POA: Diagnosis not present

## 2020-05-31 DIAGNOSIS — E1122 Type 2 diabetes mellitus with diabetic chronic kidney disease: Secondary | ICD-10-CM | POA: Diagnosis not present

## 2020-05-31 DIAGNOSIS — N1831 Chronic kidney disease, stage 3a: Secondary | ICD-10-CM | POA: Diagnosis not present

## 2020-05-31 DIAGNOSIS — E1151 Type 2 diabetes mellitus with diabetic peripheral angiopathy without gangrene: Secondary | ICD-10-CM | POA: Diagnosis not present

## 2020-05-31 DIAGNOSIS — D631 Anemia in chronic kidney disease: Secondary | ICD-10-CM | POA: Diagnosis not present

## 2020-05-31 DIAGNOSIS — E785 Hyperlipidemia, unspecified: Secondary | ICD-10-CM | POA: Diagnosis not present

## 2020-05-31 NOTE — Patient Outreach (Signed)
[facility]) Care Management  05/31/2020  Teion Ballin 1941/08/25 [id number]   CSW called pt back to remind him to schedule ride for HF visit next Monday.  CSW gave pt the info 06/06/2020 1:30 and he plans to call to arrange.      Eduard Clos, MSW, [place] Worker  [place] [phone]

## 2020-06-02 ENCOUNTER — Other Ambulatory Visit: Payer: Self-pay | Admitting: *Deleted

## 2020-06-02 NOTE — Patient Outreach (Signed)
[facility]) Care Management  06/02/2020  Jordan Perkins 26-Nov-1940 [id number]   [facility] outreach to complex care patient   Jordan Perkins was referred to [facility] on 04/05/20 for MD referral for Referral Reason:Heart failure would benefit from outpatient case management through [facility] and then on 04/11/20 for EMMI generalred alert for not knowing who to call about changes in condition and issues with wound healing- Resolved EMMI on 04/13/20 And recently on 05/11/20 for EMMI general for 05/10/20 1001Know who to call about changes in condition? No Scheduled follow-up? No Lost interest in things? 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) RN CM reviewed [place] with patient. Patient gave verbal consent for services.  Transportation to upcoming medical appointment He has not obtained transportation for his upcoming 06/06/20 appointment  He voices that he is aware of the need to call medicaid transportation He reports he  has called [phone] and "got confused"  Pt and [facility] RN CM called [phone] via a conference call Sharyn Lull shared he need to  Call [phone] to call in his scheduled appointment information [facility] RN CM and patient confirmed he had the correct number [phone] to call in the information about his upcoming appointments with teach back he was able to repeat the number and confirm he would call this number to tell them when he has an appointment with details  [facility] RN CM completed a conference  call to [phone] with Jordan Thrall to leave a message per the automated system with details for his 06/06/20 1330 [place] heart failure appointment to include the address, telephone number, Darylene Price and pt + [facility] RN CM numbers A return call was placed back to [place] at [phone] to discuss the confusion patient experience when calling to schedule his transportation appointments and to see if there is another way of completing this. Sharyn Lull to follow up with Jordan Sigg She confirmed his address and confirmed he last used the service on 05/18/20   1034 06/02/20 [facility] RN CM received a call from [employer] [place] transportation to confirm Jordan Jaime transportation was scheduled   [facility] RN CM will follow up with Jordan Muldrew with in the next 30 business days Pt encouraged to return a call to [facility] RN CM prn Goals Addressed              This Visit's Progress     Patient Stated   .  [facility]) Patient will be able to verbalize management of CHF at home as evidence by no readmission and decrease in worsening symptoms (pt-stated)   On track     [place] (see longtitudinal plan of care for additional care plan information)   Current Barriers:  [place] Kitchen Knowledge deficit related to basic heart failure pathophysiology and self care management . Cognitive Deficits . Hard of hearing   Poor home support and memory issues . Transportation to medical appointments  Case Manager Clinical Goal(s):  Marland Kitchen Over the next 90 days, patient will verbalize understanding of Heart Failure Action Plan and when to  call doctor . Over the next 30 days, patient will take all Heart Failure mediations as prescribed . 05/30/20 related to CHF now weighing daily, still with questions about fluid and sodium intake . 05/30/20 Still having to be reminded of upcoming appointments and securing transportation to appointments   Interventions:  . Basic overview and discussion of pathophysiology of Heart Failure reviewed  . Provided verbal  education on low sodium diet . Discussed importance of daily weight and advised patient to weigh and record daily . Reviewed role of diuretics in prevention of fluid overload and management of heart failure . [facility] SW referral for transportation needs, [facility] SW evaluation of [place] needs for [place] patient, possible Personal care services (PCS) aide services  . Coordinated/collaborated with [facility] CMA to have patient mailed scales . Review upcoming appointments and remind him of the importance of attending appointments . Encouraged checking mail for forms sent by [facility] SW and follow up with Jal with [place] to landlord/friend to confirm assist with transportation to medical appointment . Remind him of CHF action plan, who to call if he has changes in his condition and encouraged him to outreach to MD for ANY medical changes  . Complete EMMI assessments  . Reminder calls related to his appointments . Collaborated with [place] NP/staff -updated [facility] SW 05/13/20  . 05/30/20 Reviewed CHF action plan, importance of weighing daily, causes of hyponatremia . 05/30/20 Sent EMMIs on hyponatremia, low salt diet, adult heart failure, fluid restricted diet in the mail . 06/02/20 Completed conference call with pt to agencies to schedule transportation for 06/06/20 appointment- Received call to confirm pt is scheduled from [facility] updated [facility] SW   Patient Self Care Activities:  . Takes Heart Failure Medications as prescribed . Verbalizes understanding of and follows CHF Action Plan . Adheres to low sodium diet . Attend all MD appointments .  04/20/20 agree to cereal in am, cook his own low sodium lunch or dinner, one meals on wheels/prepackaged meal daily  . Inform home health staff of worsening symptoms  . Call Cardiology or pcp with ANY medical changes . Return calls to [place] . Weight daily an document . Follow fluid restriction & diuretic intake as ordered . Call  transportation agency to schedule appointments at least 3 days in advance     Please see past updates related to this goal by clicking on the "Past Updates" button in the selected goal         [place]mond Whittley L. Lavina Hamman, RN, BSN, [place] Coordinator Office number [phone] [facility] number [phone] Fax number [phone]

## 2020-06-06 ENCOUNTER — Other Ambulatory Visit: Payer: Self-pay

## 2020-06-06 ENCOUNTER — Ambulatory Visit: Payer: Medicare HMO | Attending: Family | Admitting: Family

## 2020-06-06 ENCOUNTER — Encounter: Payer: Self-pay | Admitting: Family

## 2020-06-06 VITALS — BP 125/52 | HR 69 | Resp 18 | Ht 71.0 in | Wt 159.0 lb

## 2020-06-06 DIAGNOSIS — Z882 Allergy status to sulfonamides status: Secondary | ICD-10-CM | POA: Insufficient documentation

## 2020-06-06 DIAGNOSIS — I130 Hypertensive heart and chronic kidney disease with heart failure and stage 1 through stage 4 chronic kidney disease, or unspecified chronic kidney disease: Secondary | ICD-10-CM | POA: Diagnosis not present

## 2020-06-06 DIAGNOSIS — I10 Essential (primary) hypertension: Secondary | ICD-10-CM

## 2020-06-06 DIAGNOSIS — I110 Hypertensive heart disease with heart failure: Secondary | ICD-10-CM | POA: Insufficient documentation

## 2020-06-06 DIAGNOSIS — Z951 Presence of aortocoronary bypass graft: Secondary | ICD-10-CM | POA: Diagnosis not present

## 2020-06-06 DIAGNOSIS — N1831 Chronic kidney disease, stage 3a: Secondary | ICD-10-CM | POA: Diagnosis not present

## 2020-06-06 DIAGNOSIS — D631 Anemia in chronic kidney disease: Secondary | ICD-10-CM | POA: Diagnosis not present

## 2020-06-06 DIAGNOSIS — Z9049 Acquired absence of other specified parts of digestive tract: Secondary | ICD-10-CM | POA: Diagnosis not present

## 2020-06-06 DIAGNOSIS — I4821 Permanent atrial fibrillation: Secondary | ICD-10-CM | POA: Diagnosis not present

## 2020-06-06 DIAGNOSIS — E785 Hyperlipidemia, unspecified: Secondary | ICD-10-CM | POA: Insufficient documentation

## 2020-06-06 DIAGNOSIS — Z79899 Other long term (current) drug therapy: Secondary | ICD-10-CM | POA: Insufficient documentation

## 2020-06-06 DIAGNOSIS — Z955 Presence of coronary angioplasty implant and graft: Secondary | ICD-10-CM | POA: Insufficient documentation

## 2020-06-06 DIAGNOSIS — I2510 Atherosclerotic heart disease of native coronary artery without angina pectoris: Secondary | ICD-10-CM | POA: Insufficient documentation

## 2020-06-06 DIAGNOSIS — I2721 Secondary pulmonary arterial hypertension: Secondary | ICD-10-CM | POA: Insufficient documentation

## 2020-06-06 DIAGNOSIS — Z8249 Family history of ischemic heart disease and other diseases of the circulatory system: Secondary | ICD-10-CM | POA: Insufficient documentation

## 2020-06-06 DIAGNOSIS — I5032 Chronic diastolic (congestive) heart failure: Secondary | ICD-10-CM | POA: Diagnosis not present

## 2020-06-06 DIAGNOSIS — E1151 Type 2 diabetes mellitus with diabetic peripheral angiopathy without gangrene: Secondary | ICD-10-CM | POA: Diagnosis not present

## 2020-06-06 DIAGNOSIS — E1122 Type 2 diabetes mellitus with diabetic chronic kidney disease: Secondary | ICD-10-CM | POA: Diagnosis not present

## 2020-06-06 DIAGNOSIS — E871 Hypo-osmolality and hyponatremia: Secondary | ICD-10-CM | POA: Insufficient documentation

## 2020-06-06 DIAGNOSIS — Z9114 Patient's other noncompliance with medication regimen: Secondary | ICD-10-CM | POA: Insufficient documentation

## 2020-06-06 DIAGNOSIS — I4891 Unspecified atrial fibrillation: Secondary | ICD-10-CM | POA: Diagnosis not present

## 2020-06-06 DIAGNOSIS — I700 Atherosclerosis of aorta: Secondary | ICD-10-CM | POA: Diagnosis not present

## 2020-06-06 NOTE — Progress Notes (Signed)
Patient ID: Jordan Perkins, male    DOB: Jan 27, 1941, 79 y.o.   MRN: [id number]  HPI  Jordan Perkins is a 79 y/o male with a history of hyperlipidemia, HTN, PAH, atrial fibrillation and chronic heart failure.   Echo report from 03/23/20 reviewed and showed an EF of 60-65% along with severely elevated PA pressure, mild LAE and mild Jordan.   Admitted 04/30/20 due to weakness and lower extremity edema. Found to have low sodium at 119. Nephrology and palliative care consults obtained. Treated w/ 3% saline and then tolvaptan x 2 with improvement of sodium. PT/OT evaluations done. Discharged after 5 days.   He presents today for his initial visit with a chief complaint of moderate shortness of breath with moderate exertion. He describes this as chronic in nature having been present for several years. He has no other symptoms and specifically denies any difficulty sleeping, abdominal distention, palpitations, pedal edema, chest pain, dizziness, cough, fatigue or weight gain.   Says that he does have scales and is weighing daily. Previous PCP note on recent labs show patient was to stop lisinopril and reduce furosemide in 1/2. Patient says that his lasix was stopped although then says that he's taking the furosemide. Appears pleasantly confused about how he's supposed to be taking his medications.    Past Medical History:  Diagnosis Date  . (HFpEF) heart failure with preserved ejection fraction ([place])   . CHF (congestive heart failure) ([place])   . Coronary artery disease   . Hyperlipidemia   . Hypertension   . PAH (pulmonary artery hypertension) ([place])   . Permanent atrial fibrillation ([place])   . Pleural effusion    Past Surgical History:  Procedure Laterality Date  . CARDIAC CATHETERIZATION    . CHOLECYSTECTOMY    . CORONARY ANGIOPLASTY  06/2004   s/p stent placement @ [facility]  . 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 Heart disease  Father   . Heart disease Paternal Uncle   . Heart attack Brother    Social History   Tobacco Use  . Smoking status: Never Smoker  . Smokeless tobacco: Never Used  Substance Use Topics  . Alcohol use: No   Allergies  Allergen Reactions  . Sulfa Antibiotics Rash    Rash/hives   . Other Rash   Prior to Admission medications   Medication Sig Start Date End Date Taking? Authorizing Provider  atorvastatin (LIPITOR) 80 MG tablet TAKE 1 TABLET BY MOUTH EVERY DAY 03/28/20  Yes Gollan, Kathlene November, MD  ferrous sulfate 325 (65 FE) MG tablet Take 1 tablet (325 mg total) by mouth daily. 04/06/20 04/06/21 Yes Fritzi Mandes, MD  spironolactone (ALDACTONE) 25 MG tablet Take 1 tablet (25 mg total) by mouth daily. 04/14/20  Yes Loel Dubonnet, NP  furosemide (LASIX) 40 MG tablet Take 1 tablet (40 mg total) by mouth daily.  04/14/20   Loel Dubonnet, NP  lisinopril (ZESTRIL) 40 MG tablet Take 1 tablet (40 mg total) by mouth daily. 01/02/19 05/11/20  [name], MD    Review of Systems  Constitutional: Negative for appetite change and fatigue.  HENT: Negative for congestion and sore throat.   Eyes: Negative.   Respiratory: Positive for shortness of breath. Negative for cough and chest tightness.   Cardiovascular: Negative for chest pain, palpitations and leg swelling.  Gastrointestinal: Negative for abdominal distention and abdominal pain.  Endocrine:  Negative.   Genitourinary: Negative.   Musculoskeletal: Negative for back pain and neck pain.  Skin: Negative.   Allergic/Immunologic: Negative.   Neurological: Negative for dizziness and light-headedness.  Hematological: Negative for adenopathy. Does not bruise/bleed easily.  Psychiatric/Behavioral: Negative for dysphoric mood and sleep disturbance (sleeping on 2 pillows). The patient is not nervous/anxious.     Vitals:   06/06/20 1332  BP: (!) 125/52  Pulse: 69  Resp: 18  SpO2: 97%  Weight: 159 lb (72.1 kg)  Height: 5\' 11"  (1.803 m)    Wt Readings from Last 3 Encounters:  06/06/20 159 lb (72.1 kg)  05/30/20 168 lb (76.2 kg)  05/18/20 170 lb 12.8 oz (77.5 kg)   Lab Results  Component Value Date   CREATININE 1.33 05/30/2020   CREATININE 1.34 05/27/2020   CREATININE 1.02 05/18/2020   Physical Exam Vitals and nursing note reviewed.  Constitutional:      Appearance: Normal appearance.  HENT:     Head: Normocephalic and atraumatic.  Cardiovascular:     Rate and Rhythm: Normal rate and regular rhythm.  Pulmonary:     Effort: Pulmonary effort is normal. No respiratory distress.     Breath sounds: No wheezing or rales.  Abdominal:     General: There is no distension.     Palpations: Abdomen is soft.     Tenderness: There is no abdominal tenderness.  Musculoskeletal:        General: No tenderness.     Cervical back: Normal range of motion and neck supple.     Right lower leg: Edema (1+ pitting) present.     Left lower leg: Edema (1+ pitting) present.  Skin:    General: Skin is warm and dry.  Neurological:     Mental Status: He is alert and oriented to person, place, and time. Mental status is at baseline.  Psychiatric:        Mood and Affect: Mood normal.        Behavior: Behavior normal.     Assessment & Plan:  1: Chronic heart failure with preserved ejection fraction with structural changes (LAE)- - NYHA class II - euvolemic today - says that he weighs daily; reminded to call for any overnight weight gain of > 2 pounds or a weekly weight gain of > 5 pounds - not adding salt - currently has PT/ nursing coming in twice week - saw cardiology Jordan Perkins) 04/29/20 & returns later this week; message to her about today's visit - saw palliative care 05/11/20 - BNP 04/30/20 was 283.6 - received both moderna covid vaccines - sent message to palliative care to see if she can assist with him getting compression socks   2: HTN- - BP looks good today - saw PCP Jordan Perkins) 05/18/20; sent message to PCP that patient  hadn't made medication changes yet but these changes were reinforced to patient today  3: Hyponatremia- - BMP 05/30/20 reviewed and showed sodium 129, potassium 4.4, creatinine 1.33 and GFR 52.16 - reviewed medication changes noted from PCP with recent lab results; patient was confused about med changes; wrote on bottles as well as his AVS to stop his lisinopril and decrease his furosemide to 20mg  daily - sent message to palliative care to reinforce these changes at her visit with patient tomorrow   Medication bottles reviewed.   Return in 6 weeks or sooner for any questions/problems before then.

## 2020-06-06 NOTE — Patient Instructions (Addendum)
Continue weighing daily and call for an overnight weight gain of > 2 pounds or a weekly weight gain of >5 pounds.   Stop taking your lisinopril.    Reduce your furosemide to 1/2 tablet once daily.

## 2020-06-07 ENCOUNTER — Other Ambulatory Visit: Payer: Medicare HMO | Admitting: Primary Care

## 2020-06-07 ENCOUNTER — Other Ambulatory Visit: Payer: Self-pay

## 2020-06-07 ENCOUNTER — Other Ambulatory Visit: Payer: Self-pay | Admitting: Family Medicine

## 2020-06-07 DIAGNOSIS — N1831 Chronic kidney disease, stage 3a: Secondary | ICD-10-CM | POA: Diagnosis not present

## 2020-06-07 DIAGNOSIS — R413 Other amnesia: Secondary | ICD-10-CM

## 2020-06-07 DIAGNOSIS — E871 Hypo-osmolality and hyponatremia: Secondary | ICD-10-CM

## 2020-06-07 DIAGNOSIS — I700 Atherosclerosis of aorta: Secondary | ICD-10-CM | POA: Diagnosis not present

## 2020-06-07 DIAGNOSIS — D631 Anemia in chronic kidney disease: Secondary | ICD-10-CM | POA: Diagnosis not present

## 2020-06-07 DIAGNOSIS — E785 Hyperlipidemia, unspecified: Secondary | ICD-10-CM | POA: Diagnosis not present

## 2020-06-07 DIAGNOSIS — I130 Hypertensive heart and chronic kidney disease with heart failure and stage 1 through stage 4 chronic kidney disease, or unspecified chronic kidney disease: Secondary | ICD-10-CM | POA: Diagnosis not present

## 2020-06-07 DIAGNOSIS — E1122 Type 2 diabetes mellitus with diabetic chronic kidney disease: Secondary | ICD-10-CM | POA: Diagnosis not present

## 2020-06-07 DIAGNOSIS — I4891 Unspecified atrial fibrillation: Secondary | ICD-10-CM | POA: Diagnosis not present

## 2020-06-07 DIAGNOSIS — E43 Unspecified severe protein-calorie malnutrition: Secondary | ICD-10-CM

## 2020-06-07 DIAGNOSIS — I5032 Chronic diastolic (congestive) heart failure: Secondary | ICD-10-CM

## 2020-06-07 DIAGNOSIS — I2510 Atherosclerotic heart disease of native coronary artery without angina pectoris: Secondary | ICD-10-CM | POA: Diagnosis not present

## 2020-06-07 DIAGNOSIS — Z515 Encounter for palliative care: Secondary | ICD-10-CM

## 2020-06-07 DIAGNOSIS — E1151 Type 2 diabetes mellitus with diabetic peripheral angiopathy without gangrene: Secondary | ICD-10-CM | POA: Diagnosis not present

## 2020-06-07 NOTE — Progress Notes (Signed)
[facility] Consult Note Telephone: [phone]  Fax: [phone]  PATIENT NAME: Jordan Perkins [id number] [phone] (home)  DOB: 1941/01/11 MRN: [id number]  PRIMARY CARE PROVIDER:    [name], MD,  [address] [place] [address] [phone]  REFERRING PROVIDER:   [name], MD [address] [place],  [place] [address] [phone]  RESPONSIBLE PARTY:   Extended Emergency Contact Information Primary Emergency Contact: [name] Mobile Phone: [phone] Relation: Niece Secondary Emergency Contact: Wilder Glade Mobile Phone: [phone] Relation: Niece  I met face to face with patient in home.    ASSESSMENT AND RECOMMENDATIONS:   1. Advance Care Planning/Goals of Care: Goals include to maximize quality of life and symptom management.  T/c to POA it discuss visit. Pt other niece will discuss with [facility] and [facility]OST form.   2. Symptom Management:   Podiatry: Appt made with Dr. Amalia Hailey.  Patient consented but was a bit apprehensive. I recommend at least one visit and family can maintain. Needs compression stocking for shoe size 8-9. I talked to niece who would very much like the podiatry f/u . I asked her to obtain med. Compression knee highs at drug store.  CHF: S1,S2,S3, some edema, 1+ Left, slight R LE. Recommend moisturizer for skin to avoid skin fissures. Denies DOE. States weights are stable. Reviewed Medication regimen. Taking 20 mg Lasix and cuts pills. Reviewed he is off lisinopril for now, he is in compliance. Also taking iron, spironolactone, and a statin.  Communitiy management: States home health RN  But not sure where she's from.    3. Follow up Palliative Care Visit: Palliative care will continue to follow for goals of care clarification and symptom management. Return 4 weeks or prn.  4. Family /Caregiver/Community Supports:  Lives  with brother. Enjoys fishing, [place] and plays guitar and banjo.  5. Cognitive / Functional decline: A and O x 2, able to do many adls, iadls. Needs transport thru [facility] and can call for these appts.  I spent 60 minutes providing this consultation,  from 1200 to 1300. More than 50% of the time in this consultation was spent coordinating communication.   CHIEF COMPLAINT:CHF,  Foot care  HISTORY OF PRESENT ILLNESS:  Jordan Perkins is a 79 y.o. year old male with multiple medical problems including HFpEF, PAH, PAD, permanent afib, debility. Palliative Care was asked to follow this patient by consultation request of [name], MD to help address advance care planning and goals of care. This is a follow up visit.  CODE STATUS: TBD, FULL  PPS: 50%  HOSPICE ELIGIBILITY/DIAGNOSIS: TBD  PAST MEDICAL HISTORY:  Past Medical History:  Diagnosis Date  . (HFpEF) heart failure with preserved ejection fraction ([place])   . CHF (congestive heart failure) ([place])   . Coronary artery disease   . Hyperlipidemia   . Hypertension   . PAH (pulmonary artery hypertension) ([place])   . Permanent atrial fibrillation ([place])   . Pleural effusion     SOCIAL HX:  Social History   Tobacco Use  . Smoking status: Never Smoker  . Smokeless tobacco: Never Used  Substance Use Topics  . Alcohol use: No   FAMILY HX:  Family History  Problem Relation Age of Onset  . Heart disease Father   . Heart disease Paternal Uncle   . Heart attack Brother     ALLERGIES:  Allergies  Allergen Reactions  .  Sulfa Antibiotics Rash    Rash/hives   . Other Rash     PERTINENT MEDICATIONS:  Outpatient Encounter Medications as of 06/07/2020  Medication Sig  . atorvastatin (LIPITOR) 80 MG tablet TAKE 1 TABLET BY MOUTH EVERY DAY  . ferrous sulfate 325 (65 FE) MG tablet Take 1 tablet (325 mg total) by mouth daily.  . furosemide (LASIX) 40 MG tablet Take 1 tablet (40 mg total) by mouth daily. (Patient taking  differently: Take 20 mg by mouth daily. )  . lisinopril (ZESTRIL) 40 MG tablet Take 1 tablet (40 mg total) by mouth daily. (Patient not taking: Reported on 06/06/2020)  . spironolactone (ALDACTONE) 25 MG tablet Take 1 tablet (25 mg total) by mouth daily.   No facility-administered encounter medications on file as of 06/07/2020.    PHYSICAL EXAM / ROS:   Current and past weights: 159 lbs per report General: NAD, frail appearing, thin Cardiovascular: no chest pain reported,  2+ LE edema  Pulmonary: no cough, no increased SOB, room air Abdomen: appetite good, endorses  occ constipation, continent of bowel GU: denies dysuria, continent of urine MSK:  no joint and ROM abnormalities, ambulatory with cane and walker, denies falls. Skin: Feet with long and thick nails. Some are curled under and several hammer toes. Neurological: Weakness, denies pain, sleeps well  Jason Coop, NP , DNP, MPH, [facility]  COVID-19 PATIENT SCREENING TOOL  Person answering questions: ____________self______ _____   1.  Is the patient or any family member in the home showing any signs or symptoms regarding respiratory infection?               Person with Symptom- __________NA_________________  a. Fever                                                                          Yes___ No___          ___________________  b. Shortness of breath                                                    Yes___ No___          ___________________ c. Cough/congestion                                       Yes___  No___         ___________________ d. Body aches/pains                                                         Yes___ No___        ____________________ e. Gastrointestinal symptoms (diarrhea, nausea)           Yes___ No___        ____________________  2. Within the past 14 days, has anyone living in  the home had any contact with someone with or under investigation for COVID-19?    Yes___ No_X_   Person  __________________

## 2020-06-08 ENCOUNTER — Other Ambulatory Visit: Payer: Self-pay | Admitting: *Deleted

## 2020-06-08 ENCOUNTER — Telehealth: Payer: Self-pay

## 2020-06-08 NOTE — Patient Outreach (Addendum)
[facility]) Care Management  06/08/2020  Jordan Perkins Apr 21, 1941 [phone]   [facility] unsuccessful outreach to complex care patient   [name] was referred to [facility] on 04/05/20 for MD referral for Referral Reason:Heart failure would benefit from outpatient case management through [facility] and then on 04/11/20 for EMMI generalred alert for not knowing who to call about changes in condition and issues with wound healing- Resolved EMMI on 04/13/20 And recently on 05/11/20 for EMMI general for 05/10/20 1001Know who to call about changes in condition? No Scheduled follow-up? No Lost interest in things? Yes  Insurance:[employer] medicare and medicaid [place] access [facility] admissions x1ED visits x 1in the last 6 months Last admission from [facility] 04/30/20-05/05/20 hyponatremia CHF 03/23/20 to 04/06/20 anasarca, bradycardia, acute on chronic diastoliccongestive Heart Failure (CHF)  Outreach attempts x 2 to home number [phone] no answer and patient does not have a voice mail box set up for [facility] RN CM to leave a HIPAA compliant message   [facility] RN CM called the county medicaid to find out if patient has called to scheduled his transportation for his 06/09/20 upcoming appointment  Spoke with Sharyn Lull at [phone] who confirms he has outreached and transportation is scheduled to pick pt up on 06/09/20 at 1230 for his 1330 appointment   [facility] RN CM will attempt again to follow up with Mr Iten with in the next 4-7 business days  [place] Riesen L. Lavina Hamman, RN, BSN, [place] Coordinator Office number [phone] [facility] number [phone] Fax number [phone]

## 2020-06-09 ENCOUNTER — Other Ambulatory Visit: Payer: Self-pay

## 2020-06-09 ENCOUNTER — Encounter: Payer: Self-pay | Admitting: Family

## 2020-06-09 ENCOUNTER — Ambulatory Visit (INDEPENDENT_AMBULATORY_CARE_PROVIDER_SITE_OTHER): Payer: Medicare HMO | Admitting: Family

## 2020-06-09 VITALS — BP 134/60 | HR 68 | Ht 71.0 in | Wt 159.0 lb

## 2020-06-09 DIAGNOSIS — I10 Essential (primary) hypertension: Secondary | ICD-10-CM | POA: Diagnosis not present

## 2020-06-09 DIAGNOSIS — E871 Hypo-osmolality and hyponatremia: Secondary | ICD-10-CM

## 2020-06-09 DIAGNOSIS — Z7901 Long term (current) use of anticoagulants: Secondary | ICD-10-CM

## 2020-06-09 DIAGNOSIS — N1831 Chronic kidney disease, stage 3a: Secondary | ICD-10-CM | POA: Diagnosis not present

## 2020-06-09 DIAGNOSIS — I4821 Permanent atrial fibrillation: Secondary | ICD-10-CM

## 2020-06-09 DIAGNOSIS — D649 Anemia, unspecified: Secondary | ICD-10-CM

## 2020-06-09 DIAGNOSIS — I2510 Atherosclerotic heart disease of native coronary artery without angina pectoris: Secondary | ICD-10-CM | POA: Diagnosis not present

## 2020-06-09 DIAGNOSIS — I700 Atherosclerosis of aorta: Secondary | ICD-10-CM | POA: Diagnosis not present

## 2020-06-09 DIAGNOSIS — E1151 Type 2 diabetes mellitus with diabetic peripheral angiopathy without gangrene: Secondary | ICD-10-CM | POA: Diagnosis not present

## 2020-06-09 DIAGNOSIS — I5032 Chronic diastolic (congestive) heart failure: Secondary | ICD-10-CM

## 2020-06-09 DIAGNOSIS — E785 Hyperlipidemia, unspecified: Secondary | ICD-10-CM | POA: Diagnosis not present

## 2020-06-09 DIAGNOSIS — I130 Hypertensive heart and chronic kidney disease with heart failure and stage 1 through stage 4 chronic kidney disease, or unspecified chronic kidney disease: Secondary | ICD-10-CM | POA: Diagnosis not present

## 2020-06-09 DIAGNOSIS — I4891 Unspecified atrial fibrillation: Secondary | ICD-10-CM | POA: Diagnosis not present

## 2020-06-09 DIAGNOSIS — E1122 Type 2 diabetes mellitus with diabetic chronic kidney disease: Secondary | ICD-10-CM | POA: Diagnosis not present

## 2020-06-09 DIAGNOSIS — D631 Anemia in chronic kidney disease: Secondary | ICD-10-CM | POA: Diagnosis not present

## 2020-06-09 MED ORDER — FUROSEMIDE 20 MG PO TABS: 20.0000 mg | ORAL_TABLET | Freq: Every day | ORAL | 1 refills | Status: DC

## 2020-06-09 NOTE — Patient Instructions (Signed)
Medication Instructions:  No medication changes today.   We sent a new Lasix tablet in for you. Use up your current prescription by continuing to take half tablet. When you pick up the new prescription, take one tablet daily.  *If you need a refill on your cardiac medications before your next appointment, please call your pharmacy*  Lab Work: None ordered today.   Testing/Procedures: None ordered today.  Follow-Up: At [facility], you and your health needs are our priority.  As part of our continuing mission to provide you with exceptional heart care, we have created designated Provider Care Teams.  These Care Teams include your primary Cardiologist (physician) and Advanced Practice Providers (APPs -  Physician Assistants and Nurse Practitioners) who all work together to provide you with the care you need, when you need it.  We recommend signing up for the patient portal called "MyChart".  Sign up information is provided on this After Visit Summary.  MyChart is used to connect with patients for Virtual Visits (Telemedicine).  Patients are able to view lab/test results, encounter notes, upcoming appointments, etc.  Non-urgent messages can be sent to your provider as well.   To learn more about what you can do with MyChart, go to [email or web address].    Your next appointment:   4 week(s)  The format for your next appointment:   In Person  Provider:   You may see Ida Rogue, MD or one of the following Advanced Practice Providers on your designated Care Team:   Murray Hodgkins, NP  Laurann [place], NP  Christell Faith, PA-C  Marrianne Mood, PA-C  Cadence Kathlen Mody, [place]  Other Instructions  Keep up low salt diet!  Keep your feet up when sitting.   Buy compression stockings.

## 2020-06-09 NOTE — Progress Notes (Signed)
Office Visit    Patient Name: Jordan Perkins Date of Encounter: 06/09/2020  Primary Care Provider:  [name], MD Primary Cardiologist:  Ida Rogue, MD Electrophysiologist:  None   Chief Complaint    [name] is a 79 y.o. male with a hx of HTN, HLD, permanent atrial fibrillation, pulmonary hypertension, CAD s/p CABG, diastolic congestive heart failure, anemia, CKD 3 presents today for HF follow up.   Past Medical History    Past Medical History:  Diagnosis Date  . (HFpEF) heart failure with preserved ejection fraction ([place])   . CHF (congestive heart failure) ([place])   . Coronary artery disease   . Hyperlipidemia   . Hypertension   . PAH (pulmonary artery hypertension) ([place])   . Permanent atrial fibrillation ([place])   . Pleural effusion    Past Surgical History:  Procedure Laterality Date  . CARDIAC CATHETERIZATION    . CHOLECYSTECTOMY    . CORONARY ANGIOPLASTY  06/2004   s/p stent placement @ [facility]  . CORONARY ARTERY BYPASS GRAFT  04-24-2004   CABG x 3 [facility]    Allergies  Allergies  Allergen Reactions  . Sulfa Antibiotics Rash    Rash/hives   . Other Rash    History of Present Illness    Jordan Perkins is a 79 y.o. male with a hx of HTN, HLD, permanent atrial fibrillation, pulmonary hypertension, CAD s/p CABG, diastolic congestive heart failure, anemia, CKD 3 last seen 06/06/20 by Darylene Price, NP  He was admitted 7/14 through 04/06/2020 [facility] due to anasarca, acute on chronic diastolic heart failure, bradycardia.  His bradycardia was asymptomatic.  His metoprolol was discontinued.  His hydrochlorothiazide was discontinued.  His Xarelto was stopped due to FOBT and falls.  He had recurrent falls with generalized weakness.  He was discharged on Lasix 40 mg daily, lisinopril 40 mg daily, spironolactone 25 mg daily.  His weight on date of discharge was 75.8 kg.  Labs 04/04/2020 with hemoglobin 7.6, creatinine 1.06, GFR greater than 60.  He was  seen in clinic 04/29/2020 with weight gain of 9 pounds over 2 weeks.  He was recommended to change his Lasix dose however repeat blood work showed sodium of 119 he was recommended for evaluation in the ED.  Admitted 04/30/20 - 05/05/20 treated with 3% saline and then tolvaptan x2.  Home health was recommended.  Palliative care continues to follow.  He was discharged on lisinopril 40 mg daily, Lasix 40 mg daily, spironolactone 25 mg daily.  Sodium on day of discharge 133.  Seen by primary care for follow-up 05/18/2020 and referred to nephrology.  Labs that day with sodium 130.  Follow-up blood work 05/27/2020 with sodium 127.  He was recommended to cut his Lasix in half and stop his lisinopril.  Repeat lab work 05/30/2020 sodium 129 however noted he did not make the medication changes as recommended.  Seen by Darylene Price, NP 06/03/2020.  He was recommended to stop his lisinopril and reduce his Furosemide dose by half.  These recommendations had previously been made by Dr. Caryl Bis but the patient had not yet started.  Presents today for follow-up.  He brings his medication bottles for review.  The bottles have been marked so that he has made the appropriate medication changes mentioned above.  Reports no chest pain, pressure, tightness.  Reports no shortness of breath at rest.  Endorses stable dyspnea on exertion.  He is enjoying working with physical therapy at home.  He  reports no lower extremity edema.  Tells me he is weighing himself daily with weight routinely 159 pounds.  EKGs/Labs/Other Studies Reviewed:   The following studies were reviewed today:  Echo 03/23/2020 1. Left ventricular ejection fraction, by estimation, is 60 to 65%. The  left ventricle has normal function. The left ventricle demonstrates global  hypokinesis. Left ventricular diastolic parameters are indeterminate.   2. Right ventricular systolic function is mildly reduced. The right  ventricular size is mildly enlarged. There is  severely elevated pulmonary  artery systolic pressure. The estimated right ventricular systolic  pressure is 440.1 mmHg.   3. Left atrial size was mildly dilated.   4. Right atrial size was mildly dilated.   5. The mitral valve is grossly normal. Mild mitral valve regurgitation.   6. The aortic valve is tricuspid. Aortic valve regurgitation is trivial.  Mild aortic valve sclerosis is present, with no evidence of aortic valve  stenosis.   7. The inferior vena cava is dilated in size with <50% respiratory  variability, suggesting right atrial pressure of 15 mmHg.   EKG: No EKG today  Recent Labs: 04/03/2020: Magnesium 2.1 04/14/2020: ALT 15 04/30/2020: B Natriuretic Peptide 283.6 05/05/2020: Hemoglobin 8.0; Platelets 148 05/30/2020: BUN 67; Creatinine, Ser 1.33; Potassium 4.4; Sodium 129  Recent Lipid Panel    Component Value Date/Time   CHOL 75 03/24/2020 0434   CHOL 89 (L) 04/06/2015 1642   CHOL 150 06/06/2013 0947   TRIG 42 03/24/2020 0434   TRIG 85 06/06/2013 0947   HDL 21 (L) 03/24/2020 0434   HDL 40 04/06/2015 1642   HDL 43 06/06/2013 0947   CHOLHDL 3.6 03/24/2020 0434   VLDL 8 03/24/2020 0434   VLDL 17 06/06/2013 0947   LDLCALC 46 03/24/2020 0434   LDLCALC 36 04/06/2015 1642   LDLCALC 90 06/06/2013 0947    Home Medications   No outpatient medications have been marked as taking for the 06/09/20 encounter (Office Visit) with Loel Dubonnet, NP.    Review of Systems   Review of Systems  Constitutional: Negative for chills, fever and malaise/fatigue.  Cardiovascular: Positive for dyspnea on exertion. Negative for chest pain, irregular heartbeat, leg swelling, near-syncope, orthopnea, palpitations and syncope.  Respiratory: Negative for cough, shortness of breath and wheezing.   Gastrointestinal: Negative for melena, nausea and vomiting.  Genitourinary: Negative for hematuria.  Neurological: Negative for dizziness, light-headedness and weakness.   All other systems  reviewed and are otherwise negative except as noted above.  Physical Exam    VS:  Ht 5\' 11"  (1.803 m)   BMI 22.18 kg/m  , BMI Body mass index is 22.18 kg/m. GEN: Thin, well developed, in no acute distress. HEENT: normal. Neck: Supple, no JVD, carotid bruits, or masses. Cardiac: irregularly irregular, no murmurs, rubs, or gallops. No clubbing, cyanosis, edema. Radials/DP/PT 2+ and equal bilaterally.  Respiratory:  Respirations regular and unlabored, clear to auscultation bilaterally. GI: Soft, nontender, nondistended MS: No deformity or atrophy. Skin: Warm and dry, no rash. Neuro:  Strength and sensation are intact. Psych: Normal affect.  Assessment & Plan    1. Permanent atrial fibrillation, bradycardia -05/11/20 with 100% atrial fibrillation burden.  One 3-second pause which was asymptomatic.  No significant pause no indication for referral to EP at this time.  Rate is controlled today on exam.  Not presently on any rate limiting agents, no indication at this time.   2. Chronic anticoagulation/FOBT/anemia -most recent hemoglobin 8.0.  Denies bleeding complications.  He had positive  FOBT in the hospital 03/2020 and his Xarelto was discontinued. He is high risk off of anticoagulation with CHADS2VASc score of 5.  Will defer reinitiation of Xarelto until he is seen by GI.  He does have upcoming appointment in a few weeks  3. HFpEF/Hyponatremia - Does endorse keeping his lower extremities elevated. His Meals on Wheels are low-sodium. He drinks less than 2 L of fluid per day.  We discussed the importance of fluid retention due to his recent admission for hyponatremia.  Weight stable 159 pounds.  Continue Lasix 20 mg daily.  He is presently taking half of a 40 mg tablet and I have sent a refill in for 20 mg tablet.  Educated multiple times on tablet strength change appointment to pharmacy for them to provide additional education as well.  Continue Aldactone 25 mg daily.  Upcoming lab work with PCP  for monitoring of hyponatremia.  He has been referred to nephrology by his primary care provider.  4. CAD s/p CABG - No indication for ischemic evaluation at this time.  Denies anginal symptoms.. No beta-blocker due to bradycardia. Continue present statin.  5. HTN - BP well controlled. Continue current antihypertensive regimen.   Disposition: Follow up 4-6 weeks with Dr. Rockey Situ or APP.  Loel Dubonnet, NP 06/09/2020, 1:14 PM

## 2020-06-09 NOTE — Telephone Encounter (Signed)
Palliative care SW LVM to conduct telephonic visit. Awaiting return call.

## 2020-06-13 ENCOUNTER — Other Ambulatory Visit: Payer: Self-pay | Admitting: *Deleted

## 2020-06-13 ENCOUNTER — Encounter: Payer: Self-pay | Admitting: *Deleted

## 2020-06-13 ENCOUNTER — Other Ambulatory Visit: Payer: Self-pay

## 2020-06-13 DIAGNOSIS — I4821 Permanent atrial fibrillation: Secondary | ICD-10-CM | POA: Diagnosis not present

## 2020-06-13 DIAGNOSIS — I5032 Chronic diastolic (congestive) heart failure: Secondary | ICD-10-CM | POA: Diagnosis not present

## 2020-06-13 DIAGNOSIS — D631 Anemia in chronic kidney disease: Secondary | ICD-10-CM | POA: Diagnosis not present

## 2020-06-13 DIAGNOSIS — E871 Hypo-osmolality and hyponatremia: Secondary | ICD-10-CM | POA: Diagnosis not present

## 2020-06-13 DIAGNOSIS — E1122 Type 2 diabetes mellitus with diabetic chronic kidney disease: Secondary | ICD-10-CM | POA: Diagnosis not present

## 2020-06-13 DIAGNOSIS — I130 Hypertensive heart and chronic kidney disease with heart failure and stage 1 through stage 4 chronic kidney disease, or unspecified chronic kidney disease: Secondary | ICD-10-CM | POA: Diagnosis not present

## 2020-06-13 DIAGNOSIS — N1831 Chronic kidney disease, stage 3a: Secondary | ICD-10-CM | POA: Diagnosis not present

## 2020-06-13 DIAGNOSIS — E1151 Type 2 diabetes mellitus with diabetic peripheral angiopathy without gangrene: Secondary | ICD-10-CM | POA: Diagnosis not present

## 2020-06-13 DIAGNOSIS — I2510 Atherosclerotic heart disease of native coronary artery without angina pectoris: Secondary | ICD-10-CM | POA: Diagnosis not present

## 2020-06-13 DIAGNOSIS — E785 Hyperlipidemia, unspecified: Secondary | ICD-10-CM | POA: Diagnosis not present

## 2020-06-13 NOTE — Patient Outreach (Signed)
[facility]) Care Management  06/13/2020  Jordan Perkins [birth year] [id number]  Jordan Jordan Perkins was referred to [facility] on 04/05/20 for MD referral for Referral Reason:Heart failure would benefit from outpatient case management through [facility] and then on 04/11/20 for EMMI generalred alert for not knowing who to call about changes in condition and issues with wound healing- Resolved EMMI on 04/13/20 And recently on 05/11/20 for EMMI general for 05/10/20 1001Know who to call about changes in condition? No Scheduled follow-up? No Lost interest in things? 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)  He reports he is scheduled to see a podiatrist on tomorrow 06/14/20 at 2 pm with assist of neighbor, Jordan [name]  Will see podiatrist Dr Daylene Katayama at [place]  [phone] Fax: [phone]  He reports missing the deadline to call in for his medicaid transportation services He is aware he needs to call at least 2 days in advance not the day of the appointment  Ankles bilaterally still hurt Home health Nurse continues to visit "just left " Breathing is "good" He denies any respiratory or cardiac symptoms ( chest pains)  He reports managing his diet better He reports having vegetables today to include okra and cabbage He reports he did not have a garden this year but enjoys gardening, fishing, bluegrass and plays guitar and banjo  Has compression hose offered to him by his neighbor but reports he tore them when tying to apply them He has [employer] and is not sure if have an over the counter benefit program. He would prefer a new pair   [facility] RN CM will follow up with Jordan Perkins within the next 30 business days  Pt  encouraged to return a call to [facility] RN CM prn Routed note to MD Goals Addressed              This Visit's Progress     Patient Stated   .  [facility]) Eat Healthy (pt-stated)   On track     Follow Up Date 06/27/20    - change to whole grain breads, cereal, pasta - drink 6 to 8 glasses of water each day - fill half of plate with vegetables - limit fast food meals to no more than 1 per week - prepare main meal at home 3 to 5 days each week - set a realistic goal    Why is this important?   When you are ready to manage your nutrition or weight, having a plan and setting goals will help.  Taking small steps to change how you eat and exercise is a good place to start.    Notes: 06/13/20 eating better, watching sodium reports having a vegetable plate today     .  [facility]) Find Help in My Community (pt-stated)   On track     Follow Up Date 06/27/20   - follow-up on any referrals for help I am given - have a back-up plan - make a list of family or friends that I can call    Why is this important?   Knowing how and where to find help for yourself or family in your neighborhood and community is an important skill.  You will want to take some steps to learn how.  Notes:     Marland Kitchen  [facility]) Follow My Treatment Plan (pt-stated)   On track     Follow Up Date 06/27/20   - keep follow-up appointments - keep taking my medicines, even when I feel good    Why is this important?   Staying as healthy as you can is very important. This may mean making changes if you smoke, don't exercise or eat poorly.  A healthy lifestyle is an important goal for you.  Following the treatment plan and making changes may be hard.  Try some of these steps to help keep the disease from getting worse.     Notes:     .  [facility]) Learn More About My Health (pt-stated)   On track     Follow Up Date 06/27/20    - ask questions - repeat what I heard to make sure I understand - bring a list of my medicines to the visit      Why is this important?   The best way to learn about your health and care is by talking to the doctor and nurse.  They will answer your questions and give you information in the way that you like best.    Notes:     .  COMPLETED: [facility]) Patient will be able to verbalize management of CHF at home as evidence by no readmission and decrease in worsening symptoms (pt-stated)        CARE PLAN ENTRY (see longtitudinal plan of care for additional care plan information)   Current Barriers:  Marland Kitchen Knowledge deficit related to basic heart failure pathophysiology and self care management . Cognitive Deficits . Hard of hearing   Poor home support and memory issues . Transportation to medical appointments  Case Manager Clinical Goal(s):  Marland Kitchen Over the next 90 days, patient will verbalize understanding of Heart Failure Action Plan and when to call doctor . Over the next 30 days, patient will take all Heart Failure mediations as prescribed . 05/30/20 related to CHF now weighing daily, still with questions about fluid and sodium intake . 06/13/20 Still having to be reminded of upcoming appointments and securing transportation to appointments Called too late to schedule his transportation for 06/14/20 appointment so he outreached to his neighbor . 06/13/20 changed to patient care plans (elsevier)   Interventions:  . Basic overview and discussion of pathophysiology of Heart Failure reviewed  . Provided verbal education on low sodium diet . Discussed importance of daily weight and advised patient to weigh and record daily . Reviewed role of diuretics in prevention of fluid overload and management of heart failure . [facility] SW referral for transportation needs, [facility] SW evaluation of [place] needs for [place] patient, possible Personal care services (PCS) aide services  . Coordinated/collaborated with [facility] CMA to have patient mailed scales . Review upcoming appointments and remind him of the importance of attending  appointments . Encouraged checking mail for forms sent by [facility] SW and follow up with Mier with [place] to landlord/friend to confirm assist with transportation to medical appointment . Remind him of CHF action plan, who to call if he has changes in his condition and encouraged him to outreach to MD for ANY medical changes  . Complete EMMI assessments  . Reminder calls related to his appointments . Collaborated with [place] NP/staff -updated [facility] SW 05/13/20  . 05/30/20 Reviewed CHF action plan, importance of weighing daily, causes of hyponatremia . 05/30/20  Sent EMMIs on hyponatremia, low salt diet, adult heart failure, fluid restricted diet in the mail . 06/02/20 Completed conference call with pt to agencies to schedule transportation for 06/06/20 appointment- Received call to confirm pt is scheduled from [facility] updated [facility] SW   Patient Self Care Activities:  . Takes Heart Failure Medications as prescribed . Verbalizes understanding of and follows CHF Action Plan . Adheres to low sodium diet . Attend all MD appointments .  04/20/20 agree to cereal in am, cook his own low sodium lunch or dinner, one meals on wheels/prepackaged meal daily  . Inform home health staff of worsening symptoms  . Call Cardiology or pcp with ANY medical changes . Return calls to [place] . Weight daily an document . Follow fluid restriction & diuretic intake as ordered . Call transportation agency to schedule appointments at least 3 days in advance     Please see past updates related to this goal by clicking on the "Past Updates" button in the selected goal      .  [facility]) Track and Manage Fluids and Swelling (pt-stated)   On track     Follow Up Date 06/27/20    - call office if I gain more than 2 pounds in one day or 5 pounds in one week - keep legs up while sitting - use salt in moderation - watch for swelling in feet, ankles and legs every day - weigh myself daily    Why is this  important?   It is important to check your weight daily and watch how much salt and liquids you have.  It will help you to manage your heart failure.    Notes:        Egon Dittus L. Lavina Hamman, RN, BSN, [place] Coordinator Office number [phone] Main [facility] number [phone] Fax number [phone]

## 2020-06-14 ENCOUNTER — Ambulatory Visit: Payer: Medicare HMO | Admitting: Podiatry

## 2020-06-14 ENCOUNTER — Other Ambulatory Visit: Payer: Self-pay | Admitting: *Deleted

## 2020-06-14 ENCOUNTER — Ambulatory Visit (INDEPENDENT_AMBULATORY_CARE_PROVIDER_SITE_OTHER): Payer: Medicare HMO | Admitting: Podiatry

## 2020-06-14 DIAGNOSIS — M7751 Other enthesopathy of right foot: Secondary | ICD-10-CM | POA: Diagnosis not present

## 2020-06-14 DIAGNOSIS — M19071 Primary osteoarthritis, right ankle and foot: Secondary | ICD-10-CM | POA: Diagnosis not present

## 2020-06-14 DIAGNOSIS — M79675 Pain in left toe(s): Secondary | ICD-10-CM

## 2020-06-14 DIAGNOSIS — M19072 Primary osteoarthritis, left ankle and foot: Secondary | ICD-10-CM

## 2020-06-14 DIAGNOSIS — M7752 Other enthesopathy of left foot: Secondary | ICD-10-CM | POA: Diagnosis not present

## 2020-06-14 DIAGNOSIS — B351 Tinea unguium: Secondary | ICD-10-CM

## 2020-06-14 DIAGNOSIS — M79674 Pain in right toe(s): Secondary | ICD-10-CM | POA: Diagnosis not present

## 2020-06-14 NOTE — Progress Notes (Signed)
   SUBJECTIVE Patient presents to office today complaining of elongated, thickened nails that cause pain while ambulating in shoes.  He is unable to trim his own nails.  Patient also states he has some bilateral ankle pain that hurts when he walks.  He denies a history of injury.  He states that he does have history of arthritis.  Patient is here for further evaluation and treatment.  Past Medical History:  Diagnosis Date  . (HFpEF) heart failure with preserved ejection fraction ([place])   . CHF (congestive heart failure) ([place])   . Coronary artery disease   . Hyperlipidemia   . Hypertension   . PAH (pulmonary artery hypertension) ([place])   . Permanent atrial fibrillation ([place])   . Pleural effusion     OBJECTIVE General Patient is awake, alert, and oriented x 3 and in no acute distress. Derm Skin is dry and supple bilateral. Negative open lesions or macerations. Remaining integument unremarkable. Nails are tender, long, thickened and dystrophic with subungual debris, consistent with onychomycosis, 1-5 bilateral. No signs of infection noted. Vasc  DP and PT pedal pulses palpable bilaterally. Temperature gradient within normal limits.  Neuro Epicritic and protective threshold sensation grossly intact bilaterally.  Musculoskeletal Exam No symptomatic pedal deformities noted bilateral. Muscular strength within normal limits.  There is some pain on palpation and range of motion to the bilateral ankle joints.  There is also some associated limited range of motion  ASSESSMENT 1. Onychodystrophic nails 1-5 bilateral with hyperkeratosis of nails.  2. 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  DJD/capsulitis bilateral ankles  PLAN OF CARE 1. Patient evaluated today.  2. Instructed to maintain good pedal hygiene and foot care.  3. Mechanical debridement of nails 1-5 bilaterally performed using a nail nipper. Filed with dremel without incident.  4.  Injection of 0.5 cc Celestone Soluspan  injected into the bilateral ankle joints  5.  Return to clinic in 3 mos.    Edrick Kins, DPM [facility]  Dr. Edrick Kins, [place]                                        [place], [place] [address]                Office [phone]  Fax [phone]

## 2020-06-14 NOTE — Patient Outreach (Signed)
[facility]) Care Management  06/14/2020  Jordan Perkins September 14, 1940 [id number]   CSW spoke with pt who was able to get to his appointment last week.  He also has the number to call for on-going transportation needs.   CSW will sign off at this time and advise PCP and [facility] team of above.      Eduard Clos, MSW, [place] Worker  [place] [phone]

## 2020-06-15 ENCOUNTER — Telehealth: Payer: Self-pay

## 2020-06-15 ENCOUNTER — Other Ambulatory Visit (INDEPENDENT_AMBULATORY_CARE_PROVIDER_SITE_OTHER): Payer: Medicare HMO

## 2020-06-15 DIAGNOSIS — E871 Hypo-osmolality and hyponatremia: Secondary | ICD-10-CM

## 2020-06-15 LAB — BASIC METABOLIC PANEL
BUN: 31 mg/dL — ABNORMAL HIGH (ref 6–23)
CO2: 26 mEq/L (ref 19–32)
Calcium: 9.2 mg/dL (ref 8.4–10.5)
Chloride: 93 mEq/L — ABNORMAL LOW (ref 96–112)
Creatinine, Ser: 0.98 mg/dL (ref 0.40–1.50)
GFR: 74.12 mL/min (ref >60.00)
Glucose, Bld: 137 mg/dL — ABNORMAL HIGH (ref 70–99)
Potassium: 4.9 mEq/L (ref 3.5–5.1)
Sodium: 123 mEq/L — ABNORMAL LOW (ref 135–145)

## 2020-06-15 NOTE — Telephone Encounter (Signed)
-----   Message from [name], MD sent at 06/15/2020  3:43 PM EDT ----- Please call the patient. His sodium has trended down further to 123. Has he had any balance issues, confusion, nausea, vomiting? He needs to stop his lasix and make sure that he has stopped his lisinopril. If he is having any of the above symptoms he needs to go to the ED. He needs to have this rechecked tomorrow if he does not end up needing to go to the ED. Please order a BMET for hyponatremia. If he develops any of the above symptoms he needs to go to the ED. Thanks.

## 2020-06-16 ENCOUNTER — Other Ambulatory Visit (INDEPENDENT_AMBULATORY_CARE_PROVIDER_SITE_OTHER): Payer: Medicare HMO

## 2020-06-16 ENCOUNTER — Other Ambulatory Visit: Payer: Self-pay

## 2020-06-16 DIAGNOSIS — E871 Hypo-osmolality and hyponatremia: Secondary | ICD-10-CM

## 2020-06-16 LAB — BASIC METABOLIC PANEL
BUN: 32 mg/dL — ABNORMAL HIGH (ref 6–23)
CO2: 22 mEq/L (ref 19–32)
Calcium: 9.1 mg/dL (ref 8.4–10.5)
Chloride: 94 mEq/L — ABNORMAL LOW (ref 96–112)
Creatinine, Ser: 1.01 mg/dL (ref 0.40–1.50)
GFR: 71.46 mL/min (ref >60.00)
Glucose, Bld: 100 mg/dL — ABNORMAL HIGH (ref 70–99)
Potassium: 5 mEq/L (ref 3.5–5.1)
Sodium: 124 mEq/L — ABNORMAL LOW (ref 135–145)

## 2020-06-17 ENCOUNTER — Telehealth: Payer: Self-pay | Admitting: *Deleted

## 2020-06-17 DIAGNOSIS — E871 Hypo-osmolality and hyponatremia: Secondary | ICD-10-CM

## 2020-06-17 NOTE — Telephone Encounter (Signed)
Ordered

## 2020-06-17 NOTE — Telephone Encounter (Signed)
Please place future orders for lab appt.  

## 2020-06-20 ENCOUNTER — Other Ambulatory Visit: Payer: Self-pay

## 2020-06-20 ENCOUNTER — Other Ambulatory Visit (INDEPENDENT_AMBULATORY_CARE_PROVIDER_SITE_OTHER): Payer: Medicare HMO

## 2020-06-20 DIAGNOSIS — D631 Anemia in chronic kidney disease: Secondary | ICD-10-CM | POA: Diagnosis not present

## 2020-06-20 DIAGNOSIS — E1151 Type 2 diabetes mellitus with diabetic peripheral angiopathy without gangrene: Secondary | ICD-10-CM | POA: Diagnosis not present

## 2020-06-20 DIAGNOSIS — I4821 Permanent atrial fibrillation: Secondary | ICD-10-CM | POA: Diagnosis not present

## 2020-06-20 DIAGNOSIS — E1122 Type 2 diabetes mellitus with diabetic chronic kidney disease: Secondary | ICD-10-CM | POA: Diagnosis not present

## 2020-06-20 DIAGNOSIS — E871 Hypo-osmolality and hyponatremia: Secondary | ICD-10-CM

## 2020-06-20 DIAGNOSIS — I130 Hypertensive heart and chronic kidney disease with heart failure and stage 1 through stage 4 chronic kidney disease, or unspecified chronic kidney disease: Secondary | ICD-10-CM | POA: Diagnosis not present

## 2020-06-20 DIAGNOSIS — I5032 Chronic diastolic (congestive) heart failure: Secondary | ICD-10-CM | POA: Diagnosis not present

## 2020-06-20 DIAGNOSIS — E785 Hyperlipidemia, unspecified: Secondary | ICD-10-CM | POA: Diagnosis not present

## 2020-06-20 DIAGNOSIS — N1831 Chronic kidney disease, stage 3a: Secondary | ICD-10-CM | POA: Diagnosis not present

## 2020-06-20 DIAGNOSIS — I2510 Atherosclerotic heart disease of native coronary artery without angina pectoris: Secondary | ICD-10-CM | POA: Diagnosis not present

## 2020-06-20 LAB — BASIC METABOLIC PANEL
BUN: 30 mg/dL — ABNORMAL HIGH (ref 6–23)
CO2: 26 mEq/L (ref 19–32)
Calcium: 9.2 mg/dL (ref 8.4–10.5)
Chloride: 89 mEq/L — ABNORMAL LOW (ref 96–112)
Creatinine, Ser: 1.01 mg/dL (ref 0.40–1.50)
GFR: 71.46 mL/min (ref >60.00)
Glucose, Bld: 93 mg/dL (ref 70–99)
Potassium: 4.6 mEq/L (ref 3.5–5.1)
Sodium: 122 mEq/L — ABNORMAL LOW (ref 135–145)

## 2020-06-21 ENCOUNTER — Telehealth: Payer: Self-pay | Admitting: Family Medicine

## 2020-06-21 DIAGNOSIS — E871 Hypo-osmolality and hyponatremia: Secondary | ICD-10-CM

## 2020-06-21 NOTE — Telephone Encounter (Signed)
Please follow-up with the patient to make sure he went to the ED to get evaluated for his low sodium. If he did not go to get looked at he needs to proceed to the ED at this time.

## 2020-06-22 ENCOUNTER — Telehealth: Payer: Self-pay

## 2020-06-22 DIAGNOSIS — R5381 Other malaise: Secondary | ICD-10-CM | POA: Diagnosis not present

## 2020-06-22 DIAGNOSIS — R69 Illness, unspecified: Secondary | ICD-10-CM | POA: Diagnosis not present

## 2020-06-22 NOTE — Telephone Encounter (Signed)
Cannot reach patient so called sheriff dept for well check and possible transport.

## 2020-06-22 NOTE — Telephone Encounter (Signed)
Patient says he feels fine and refuses EMS transport he has been pushing sodium said someone told heim to eat salt. Patient says he has no transportation to hospital refuses EMS transport. Patient says he does feel like he needs to go and is refusing transportation,to hospital.

## 2020-06-22 NOTE — Telephone Encounter (Signed)
See previous phone note from 06/20/20

## 2020-06-22 NOTE — Telephone Encounter (Signed)
Patient refused transport to ER . We cannot force him To go,  But  If he is volume overloaded he should resume lasix at double his usual dose (40 mg total) for 3 days,  Then reduce dose to 20 mg daily and follow up  PCP or another provider next week for sodium recheck

## 2020-06-22 NOTE — Telephone Encounter (Signed)
Patient not answering phone no voicemail tried numerous attempts no answer.

## 2020-06-22 NOTE — Telephone Encounter (Signed)
As we discussed, concerned that patient may be confused. His sodium is dangerously low and could lead to death, please explain that eating salt in HF is not the answer and could place him in fluid volume overload. He could have a seizure in setting of hyponatremia. He is off lasix and lisinopril without improvement.   This needs to managed in the hospital with  Gentle correction of sodium.   Please see if we can arrange [facility] transport if patient declines 911 transport

## 2020-06-22 NOTE — Telephone Encounter (Signed)
Patient not answering phone and has no voicemail.

## 2020-06-22 NOTE — Telephone Encounter (Signed)
EMS called back and stated patient is very swollen but refuses ER transport. Now patient will not answer.

## 2020-06-22 NOTE — Telephone Encounter (Signed)
I called the patient to see if he went to the hospital and he stated he did not have a way, I informed him how important it was that he go to the hospital because his sodium was critically low, I asked if he could ask a neighbor to take him and he state d he would try to find a way.  Kinnedy Mongiello,cma

## 2020-06-23 NOTE — Telephone Encounter (Signed)
I was able to reach patient emergency contact his niece she is going to the home to take patient to the ER, if for some reason she cannot get him [place] she we let us know.

## 2020-06-23 NOTE — Telephone Encounter (Addendum)
Tried to  reach patient this morning no answer and voicemail is not setup.

## 2020-06-24 NOTE — Telephone Encounter (Signed)
Called to verify patient went to the ER no record in Epic or Care everywhere, Cannot reach patient by phone. Called Emergency contact who advised another family member attempted to get patient to go for evaluation and he refused. Called Contact to see if she could get patient to answer phone. NO answer left message to call office [facility] [phone].

## 2020-06-24 NOTE — Telephone Encounter (Signed)
Patient DPR Niece return call and orders faxed to [facility] along with appointment times and dates.

## 2020-06-27 ENCOUNTER — Other Ambulatory Visit: Payer: Self-pay | Admitting: *Deleted

## 2020-06-27 ENCOUNTER — Other Ambulatory Visit: Payer: Self-pay

## 2020-06-27 ENCOUNTER — Other Ambulatory Visit (INDEPENDENT_AMBULATORY_CARE_PROVIDER_SITE_OTHER): Payer: Medicare HMO

## 2020-06-27 ENCOUNTER — Telehealth: Payer: Self-pay

## 2020-06-27 DIAGNOSIS — E871 Hypo-osmolality and hyponatremia: Secondary | ICD-10-CM | POA: Diagnosis not present

## 2020-06-27 LAB — BASIC METABOLIC PANEL
BUN: 40 mg/dL — ABNORMAL HIGH (ref 6–23)
CO2: 27 mEq/L (ref 19–32)
Calcium: 9 mg/dL (ref 8.4–10.5)
Chloride: 93 mEq/L — ABNORMAL LOW (ref 96–112)
Creatinine, Ser: 1.11 mg/dL (ref 0.40–1.50)
GFR: 63.74 mL/min (ref >60.00)
Glucose, Bld: 94 mg/dL (ref 70–99)
Potassium: 4 mEq/L (ref 3.5–5.1)
Sodium: 128 mEq/L — ABNORMAL LOW (ref 135–145)

## 2020-06-27 MED ORDER — MEDICAL COMPRESSION SOCKS MISC: 1.0000 "application " | Freq: Every day | 0 refills | Status: DC

## 2020-06-27 NOTE — Patient Outreach (Signed)
[facility]) Care Management  06/27/2020  Choua Chalker 1941-02-09 [id number]   [facility] outreach Mr ANTUANE EASTRIDGE was referred to [facility] on 04/05/20 for MD referral for Referral Reason:Heart failure would benefit from outpatient case management through [facility] and then on 04/11/20 for EMMI generalred alert for not knowing who to call about changes in condition and issues with wound healing- Resolved EMMI on 04/13/20 And recently on 05/11/20 for EMMI general for 05/10/20 1001Know who to call about changes in condition? No Scheduled follow-up? No Lost interest in things? 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)  [facility] RN CM intervention Spoke with Gae Bon, Dr Caryl Bis RN to discuss Mr Kranz need for compression hose Gae Bon to assist with an order if needed for compression hose and reports [facility] may be able to assist   Follow up  Mr Waddington reports he is doing well He denies worsening symptoms for CHF No sob, chest pain, wt gain  Podiatry appointment/pain of ankles  Mr Angerer confirms his visit to the podiatrist He reports his toenails were cut and he was given a shot in both ankles for pain He reports a discussion of Arthritis He continues to report his feet/ankles are still painful with ambulation    Missing Appointments and transportation to appointments [facility] referral completed, pt now with medicaid transportation but barrier remains as he forgets appointment dates and when to call for rides Still has assist from his neighbor, Mr Duanne Limerick prn He has been encouraged to put all of his after summary visit (discharge sheets) in a location he can find as they have his future appointment dates, times, provider and address  He has been  encouraged to get a calendar to write in all his appointments so he can check it daily  He has been encouraged to have his family assist with calling to Medicaid transportation to schedule his appointments as far out as a month He has been encouraged to call medicaid transportation after each appointment to schedule transportation for the next appointment  He has given permission for [facility] RN CM to outreach to his niece, [name] to discuss this  Today [facility] RN CM assisted him to reschedule an appointment for 06/28/20 with Dr Marius Ditch (no one to take him to appointment and short notice for medicaid transportation services) It was rescheduled to 07/18/20 at 1430  He was also assisted by [facility] RN CM to schedule medicaid transportation for all of his future appointments up until 07/27/20 (one month out)     Plans  [facility] RN CM will follow up with Mr Haberer within the next 30 business days Pt encouraged to return a call to [facility] RN CM prn Routed note to MD Goals Addressed              This Visit's Progress     Patient Stated   .  [facility]) Eat Healthy (pt-stated)   On track     Follow Up Date 06/27/20    - change to whole grain breads, cereal, pasta - drink 6 to 8 glasses of water each day - fill half of plate with vegetables - limit fast food meals to no more than 1 per week - prepare main meal at home 3 to 5 days each week - set a realistic goal  Why is this important?   When you are ready to manage your nutrition or weight, having a plan and setting goals will help.  Taking small steps to change how you eat and exercise is a good place to start.    Notes: 06/13/20 eating better, watching sodium reports having a vegetable plate today     .  [facility]) Find Help in My Community (pt-stated)   On track     Follow Up Date 06/27/20   - follow-up on any referrals for help I am given - have a back-up plan - make a list of family or friends that I can call    Why is this important?   Knowing how and where to  find help for yourself or family in your neighborhood and community is an important skill.  You will want to take some steps to learn how.    Notes:     .  [facility]) Follow My Treatment Plan (pt-stated)   On track     Follow Up Date 06/27/20   - keep follow-up appointments - keep taking my medicines, even when I feel good    Why is this important?   Staying as healthy as you can is very important. This may mean making changes if you smoke, don't exercise or eat poorly.  A healthy lifestyle is an important goal for you.  Following the treatment plan and making changes may be hard.  Try some of these steps to help keep the disease from getting worse.     Notes:     .  [facility]) Learn More About My Health (pt-stated)   On track     Follow Up Date 06/27/20    - ask questions - repeat what I heard to make sure I understand - bring a list of my medicines to the visit    Why is this important?   The best way to learn about your health and care is by talking to the doctor and nurse.  They will answer your questions and give you information in the way that you like best.    Notes:     .  [facility]) Make and Keep All Appointments (pt-stated)   Not on track     Follow Up Date 07/27/20   - arrange a ride through an agency 1 week before appointment - ask family or friend for a ride - call to cancel if needed - keep a calendar with appointment dates    Why is this important?   Part of staying healthy is seeing the doctor for follow-up care.  If you forget your appointments, there are some things you can do to stay on track.    Notes:     .  [facility]) Track and Manage Fluids and Swelling (pt-stated)   On track     Follow Up Date 06/27/20    - call office if I gain more than 2 pounds in one day or 5 pounds in one week - keep legs up while sitting - use salt in moderation - watch for swelling in feet, ankles and legs every day - weigh myself daily    Why is this important?   It is important to  check your weight daily and watch how much salt and liquids you have.  It will help you to manage your heart failure.    Notes:         Kindred Reidinger L. Lavina Hamman, RN, BSN, [place]  Coordinator Office number [phone] Main [facility] number [phone] Fax number [phone]

## 2020-06-27 NOTE — Telephone Encounter (Signed)
Order printed for compression socks once signed please fax.

## 2020-06-27 NOTE — Telephone Encounter (Signed)
Order printed for compression socks  and put in sign basket.  Dailynn Nancarrow,cma

## 2020-06-28 ENCOUNTER — Ambulatory Visit: Payer: Medicare HMO | Admitting: Gastroenterology

## 2020-06-28 NOTE — Telephone Encounter (Signed)
I called and spoke with the patient and informed him that his sodium has improved.  The patient has an appointment later this week for a recheck.  Patient stated he is not experiencing any SOB, when waking or laying down he is not having SOB at all. I also informed him that we did an order for compression socks for him to help with his ankles swelling and he understood.  Cerina Leary,cma

## 2020-06-28 NOTE — Telephone Encounter (Signed)
Please let the patient know his sodium is mildly improved.  We should recheck that later this week with a BMP.  Please see if he is having any shortness of breath, difficulty breathing when laying down, or waking up short of breath at night.  Thanks.

## 2020-06-29 DIAGNOSIS — E785 Hyperlipidemia, unspecified: Secondary | ICD-10-CM | POA: Diagnosis not present

## 2020-06-29 DIAGNOSIS — D631 Anemia in chronic kidney disease: Secondary | ICD-10-CM | POA: Diagnosis not present

## 2020-06-29 DIAGNOSIS — I130 Hypertensive heart and chronic kidney disease with heart failure and stage 1 through stage 4 chronic kidney disease, or unspecified chronic kidney disease: Secondary | ICD-10-CM | POA: Diagnosis not present

## 2020-06-29 DIAGNOSIS — E871 Hypo-osmolality and hyponatremia: Secondary | ICD-10-CM | POA: Diagnosis not present

## 2020-06-29 DIAGNOSIS — E1122 Type 2 diabetes mellitus with diabetic chronic kidney disease: Secondary | ICD-10-CM | POA: Diagnosis not present

## 2020-06-29 DIAGNOSIS — E1151 Type 2 diabetes mellitus with diabetic peripheral angiopathy without gangrene: Secondary | ICD-10-CM | POA: Diagnosis not present

## 2020-06-29 DIAGNOSIS — I2510 Atherosclerotic heart disease of native coronary artery without angina pectoris: Secondary | ICD-10-CM | POA: Diagnosis not present

## 2020-06-29 DIAGNOSIS — I5032 Chronic diastolic (congestive) heart failure: Secondary | ICD-10-CM | POA: Diagnosis not present

## 2020-06-29 DIAGNOSIS — N1831 Chronic kidney disease, stage 3a: Secondary | ICD-10-CM | POA: Diagnosis not present

## 2020-06-29 DIAGNOSIS — I4821 Permanent atrial fibrillation: Secondary | ICD-10-CM | POA: Diagnosis not present

## 2020-06-30 ENCOUNTER — Other Ambulatory Visit: Payer: Self-pay | Admitting: Cardiovascular Disease

## 2020-07-01 ENCOUNTER — Other Ambulatory Visit: Payer: Self-pay

## 2020-07-01 ENCOUNTER — Ambulatory Visit (INDEPENDENT_AMBULATORY_CARE_PROVIDER_SITE_OTHER): Payer: Medicare HMO | Admitting: Family Medicine

## 2020-07-01 ENCOUNTER — Encounter: Payer: Self-pay | Admitting: Family Medicine

## 2020-07-01 VITALS — BP 140/70 | HR 74 | Temp 98.0°F | Ht 71.0 in | Wt 167.6 lb

## 2020-07-01 DIAGNOSIS — I5032 Chronic diastolic (congestive) heart failure: Secondary | ICD-10-CM | POA: Diagnosis not present

## 2020-07-01 DIAGNOSIS — M25579 Pain in unspecified ankle and joints of unspecified foot: Secondary | ICD-10-CM | POA: Diagnosis not present

## 2020-07-01 DIAGNOSIS — E871 Hypo-osmolality and hyponatremia: Secondary | ICD-10-CM

## 2020-07-01 DIAGNOSIS — I4821 Permanent atrial fibrillation: Secondary | ICD-10-CM | POA: Diagnosis not present

## 2020-07-01 MED ORDER — FUROSEMIDE 20 MG PO TABS: 20.0000 mg | ORAL_TABLET | Freq: Every day | ORAL | 1 refills | Status: DC

## 2020-07-01 NOTE — Assessment & Plan Note (Signed)
Rate controlled on no medications.  He is not on anticoagulation given positive FOBT.  His hemoglobin has been stable around 8 for a number of months now. Will plan for CBC with next set of labs.

## 2020-07-01 NOTE — Assessment & Plan Note (Signed)
Improved after injection by podiatry.  He will monitor.

## 2020-07-01 NOTE — Progress Notes (Signed)
Jordan Rumps, MD Phone: [phone]  Jordan Perkins is a 79 y.o. male who presents today for f/u.  Hyponatremia: Patient notes no nausea or vomiting.  No confusion.  No change in his gait.  He has been taking Lasix 20 mg once daily.  Also taking spironolactone 25 mg once daily.  He was unable to go to the nephrologist office as he had another appointment that day.  He did not get this rescheduled.  CHF: Patient notes his weight has trended up slightly.  He notes no shortness of breath.  No orthopnea.  No PND.  He has follow-up with cardiology next week.  Notes minimal ankle swelling.  Ankle pain: Patient notes he saw podiatry for this.  He was advised he had arthritis and they gave him a steroid injection which did help with his pain.  Social History   Tobacco Use  Smoking Status Never Smoker  Smokeless Tobacco Never Used     ROS see history of present illness  Objective  Physical Exam Vitals:   07/01/20 1519  BP: 140/70  Pulse: 74  Temp: 98 F (36.7 C)  SpO2: 97%    BP Readings from Last 3 Encounters:  07/01/20 140/70  06/09/20 134/60  06/06/20 (!) 125/52   Wt Readings from Last 3 Encounters:  07/01/20 167 lb 9.6 oz (76 kg)  06/09/20 159 lb (72.1 kg)  06/06/20 159 lb (72.1 kg)    Physical Exam Constitutional:      General: He is not in acute distress.    Appearance: He is not diaphoretic.  Cardiovascular:     Rate and Rhythm: Normal rate. Rhythm irregularly irregular.     Heart sounds: Normal heart sounds.  Pulmonary:     Effort: Pulmonary effort is normal.     Breath sounds: Normal breath sounds.  Musculoskeletal:     Comments: Trace pitting edema bilaterally  Skin:    General: Skin is warm and dry.  Neurological:     Mental Status: He is alert.      Assessment/Plan: Please see individual problem list.  Problem List Items Addressed This Visit    Ankle pain    Improved after injection by podiatry.  He will monitor.      Atrial  fibrillation ([place])    Rate controlled on no medications.  He is not on anticoagulation given positive FOBT.  His hemoglobin has been stable around 8 for a number of months now. Will plan for CBC with next set of labs.       Relevant Medications   furosemide (LASIX) 20 MG tablet   CHF (congestive heart failure) ([place])    Patient's weight is up slightly over the last month or so though he reports no significant CHF symptoms.  For now he will continue on Lasix 20 mg once daily and spironolactone 25 mg once daily though we may alter his regimen depending on what his sodium is.      Relevant Medications   furosemide (LASIX) 20 MG tablet   Hyponatremia - Primary    Refer back to nephrology.  We will check a sodium level today.      Relevant Orders   Basic Metabolic Panel (BMET)   Ambulatory referral to Nephrology       This visit occurred during the SARS-CoV-2 public health emergency.  Safety protocols were in place, including screening questions prior to the visit, additional usage of staff PPE, and extensive cleaning of exam room while observing appropriate contact time as  indicated for disinfecting solutions.    Jordan Rumps, MD [place]

## 2020-07-01 NOTE — Assessment & Plan Note (Signed)
Patient's weight is up slightly over the last month or so though he reports no significant CHF symptoms.  For now he will continue on Lasix 20 mg once daily and spironolactone 25 mg once daily though we may alter his regimen depending on what his sodium is.

## 2020-07-01 NOTE — Patient Instructions (Signed)
Nice to see you. We will check lab work today and then let you know if you need to change her medicines. We need to have you see the nephrologist for your sodium level.  We will try to arrange an appointment for you.

## 2020-07-01 NOTE — Assessment & Plan Note (Signed)
Refer back to nephrology.  We will check a sodium level today.

## 2020-07-02 LAB — BASIC METABOLIC PANEL
BUN/Creatinine Ratio: 30 (calc) — ABNORMAL HIGH (ref 6–22)
BUN: 31 mg/dL — ABNORMAL HIGH (ref 7–25)
CO2: 25 mmol/L (ref 20–32)
Calcium: 9.3 mg/dL (ref 8.6–10.3)
Chloride: 97 mmol/L — ABNORMAL LOW (ref 98–110)
Creat: 1.03 mg/dL (ref 0.70–1.18)
Glucose, Bld: 94 mg/dL (ref 65–99)
Potassium: 4.1 mmol/L (ref 3.5–5.3)
Sodium: 131 mmol/L — ABNORMAL LOW (ref 135–146)

## 2020-07-04 ENCOUNTER — Other Ambulatory Visit: Payer: Self-pay | Admitting: Family Medicine

## 2020-07-04 DIAGNOSIS — D631 Anemia in chronic kidney disease: Secondary | ICD-10-CM | POA: Diagnosis not present

## 2020-07-04 DIAGNOSIS — E1151 Type 2 diabetes mellitus with diabetic peripheral angiopathy without gangrene: Secondary | ICD-10-CM | POA: Diagnosis not present

## 2020-07-04 DIAGNOSIS — N1831 Chronic kidney disease, stage 3a: Secondary | ICD-10-CM | POA: Diagnosis not present

## 2020-07-04 DIAGNOSIS — I2510 Atherosclerotic heart disease of native coronary artery without angina pectoris: Secondary | ICD-10-CM | POA: Diagnosis not present

## 2020-07-04 DIAGNOSIS — E871 Hypo-osmolality and hyponatremia: Secondary | ICD-10-CM | POA: Diagnosis not present

## 2020-07-04 DIAGNOSIS — I4821 Permanent atrial fibrillation: Secondary | ICD-10-CM | POA: Diagnosis not present

## 2020-07-04 DIAGNOSIS — I5032 Chronic diastolic (congestive) heart failure: Secondary | ICD-10-CM | POA: Diagnosis not present

## 2020-07-04 DIAGNOSIS — E1122 Type 2 diabetes mellitus with diabetic chronic kidney disease: Secondary | ICD-10-CM | POA: Diagnosis not present

## 2020-07-04 DIAGNOSIS — E785 Hyperlipidemia, unspecified: Secondary | ICD-10-CM | POA: Diagnosis not present

## 2020-07-04 DIAGNOSIS — I130 Hypertensive heart and chronic kidney disease with heart failure and stage 1 through stage 4 chronic kidney disease, or unspecified chronic kidney disease: Secondary | ICD-10-CM | POA: Diagnosis not present

## 2020-07-04 MED ORDER — SPIRONOLACTONE 25 MG PO TABS
25.0000 mg | ORAL_TABLET | Freq: Every day | ORAL | 3 refills | Status: DC
Start: 2020-07-04 — End: 2021-04-23

## 2020-07-05 ENCOUNTER — Other Ambulatory Visit: Payer: Medicare HMO | Admitting: Primary Care

## 2020-07-07 ENCOUNTER — Encounter: Payer: Self-pay | Admitting: Family

## 2020-07-07 ENCOUNTER — Telehealth: Payer: Self-pay

## 2020-07-07 ENCOUNTER — Other Ambulatory Visit: Payer: Self-pay

## 2020-07-07 ENCOUNTER — Ambulatory Visit (INDEPENDENT_AMBULATORY_CARE_PROVIDER_SITE_OTHER): Payer: Medicare HMO | Admitting: Family

## 2020-07-07 VITALS — BP 158/80 | HR 72 | Ht 71.0 in | Wt 169.0 lb

## 2020-07-07 DIAGNOSIS — I10 Essential (primary) hypertension: Secondary | ICD-10-CM

## 2020-07-07 DIAGNOSIS — E871 Hypo-osmolality and hyponatremia: Secondary | ICD-10-CM | POA: Diagnosis not present

## 2020-07-07 DIAGNOSIS — Z7901 Long term (current) use of anticoagulants: Secondary | ICD-10-CM

## 2020-07-07 DIAGNOSIS — D649 Anemia, unspecified: Secondary | ICD-10-CM

## 2020-07-07 DIAGNOSIS — I4821 Permanent atrial fibrillation: Secondary | ICD-10-CM

## 2020-07-07 DIAGNOSIS — I5032 Chronic diastolic (congestive) heart failure: Secondary | ICD-10-CM

## 2020-07-07 NOTE — Patient Instructions (Addendum)
Medication Instructions:  No medication changes today.   *If you need a refill on your cardiac medications before your next appointment, please call your pharmacy*  Lab Work: No lab work today. Your recent blood work with Dr. Caryl Bis was stable.  Testing/Procedures: None ordered today.  Follow-Up: At [facility], you and your health needs are our priority.  As part of our continuing mission to provide you with exceptional heart care, we have created designated Provider Care Teams.  These Care Teams include your primary Cardiologist (physician) and Advanced Practice Providers (APPs -  Physician Assistants and Nurse Practitioners) who all work together to provide you with the care you need, when you need it.  We recommend signing up for the patient portal called "MyChart".  Sign up information is provided on this After Visit Summary.  MyChart is used to connect with patients for Virtual Visits (Telemedicine).  Patients are able to view lab/test results, encounter notes, upcoming appointments, etc.  Non-urgent messages can be sent to your provider as well.   To learn more about what you can do with MyChart, go to [email or web address].    Your next appointment:   2-3 month(s)  The format for your next appointment:   In Person  Provider:   You may see Ida Rogue, MD or one of the following Advanced Practice Providers on your designated Care Team:    Murray Hodgkins, NP  Christell Faith, PA-C  Marrianne Mood, PA-C  Laurann Montana, NP  Cadence Kathlen Mody, [place]  Other Instructions  If you gain 3 pounds overnight or 5 pounds in one week, call our office.

## 2020-07-07 NOTE — Telephone Encounter (Addendum)
Called the patient. Patients phone rings out.  Unable to lmom patients voicemail is not set up.

## 2020-07-07 NOTE — Telephone Encounter (Signed)
-----   Message from Loel Dubonnet, NP sent at 07/07/2020  4:45 PM EDT ----- Discussed case with Dr. Marius Ditch (GI). They recommend:  RESUME Xarelto 20mg  one tablet daily with dinner. (Creatinine clearance >60) He has these tablets at home but will need refill sent to pharmacy.  START Protonix 40mg  (one tablet) twice daily.   KEEP appointment 07/18/20 with Dr. Marius Ditch as scheduled  Can we please call Mr. Keesling to relay these recommendations? Thank you!!  Loel Dubonnet, NP

## 2020-07-07 NOTE — Progress Notes (Signed)
Office Visit    Patient Name: Jordan Perkins Date of Encounter: 07/07/2020  Primary Care Provider:  [name], MD Primary Cardiologist:  Ida Rogue, MD Electrophysiologist:  None   Chief Complaint    Jordan Perkins is a 79 y.o. male with a hx of HTN, HLD, permanent atrial fibrillation, pulmonary hypertension, CAD s/p CABG, diastolic congestive heart failure, anemia, CKD 3 presents today for HF follow up.   Past Medical History    Past Medical History:  Diagnosis Date  . (HFpEF) heart failure with preserved ejection fraction ([place])   . CHF (congestive heart failure) ([place])   . Coronary artery disease   . Hyperlipidemia   . Hypertension   . PAH (pulmonary artery hypertension) ([place])   . Permanent atrial fibrillation ([place])   . Pleural effusion    Past Surgical History:  Procedure Laterality Date  . CARDIAC CATHETERIZATION    . CHOLECYSTECTOMY    . CORONARY ANGIOPLASTY  06/2004   s/p stent placement @ [facility]  . CORONARY ARTERY BYPASS GRAFT  04-24-2004   CABG x 3 [facility]    Allergies  Allergies  Allergen Reactions  . Sulfa Antibiotics Rash    Rash/hives   . Other Rash    History of Present Illness    Jordan Perkins is a 79 y.o. male with a hx of HTN, HLD, permanent atrial fibrillation, pulmonary hypertension, CAD s/p CABG, diastolic congestive heart failure, anemia, CKD 3 last seen 06/09/20.  He was admitted 7/14 through 04/06/2020 [facility] due to anasarca, acute on chronic diastolic heart failure, bradycardia.  His bradycardia was asymptomatic.  His metoprolol was discontinued.  His hydrochlorothiazide was discontinued.  His Xarelto was stopped due to FOBT and falls.  He had recurrent falls with generalized weakness.  He was discharged on Lasix 40 mg daily, lisinopril 40 mg daily, spironolactone 25 mg daily.  His weight on date of discharge was 75.8 kg.  Labs 04/04/2020 with hemoglobin 7.6, creatinine 1.06, GFR greater than 60.  He was seen in clinic  04/29/2020 with weight gain of 9 pounds over 2 weeks.  He was recommended to change his Lasix dose however repeat blood work showed sodium of 119 he was recommended for evaluation in the ED.  Admitted 04/30/20 - 05/05/20 treated with 3% saline and then tolvaptan x2.  Home health was recommended.  Palliative care continues to follow.  He was discharged on lisinopril 40 mg daily, Lasix 40 mg daily, spironolactone 25 mg daily.  Sodium on day of discharge 133.  Seen by primary care for follow-up 05/18/2020 and referred to nephrology.  Labs that day with sodium 130.  Follow-up blood work 05/27/2020 with sodium 127.  He was recommended to cut his Lasix in half and stop his lisinopril.  Repeat lab work 05/30/2020 sodium 129 however noted he did not make the medication changes as recommended. Seen by Darylene Price, NP 06/03/2020.  He was recommended to stop his lisinopril and reduce his Furosemide dose by half.  These recommendations had previously been made by Dr. Caryl Bis but the patient had not yet started.  Seen in clinic 06/09/2020.  He was recommended to continue Lasix 20 mg daily and spironolactone 25 mg daily.  He was enjoying working physical therapy at home.  His weights are stable.  Care management has been following him closely.  Saw primary care last week.  He was recommended to continue Lasix 20 mg daily and spironolactone 25 mg daily.  His sodium was 131,  creatinine 1.03.   Presents today for follow-up.  Reports no chest pain, pressure medicines.  Reports no lower extremity edema.  Endorses pain in his bilateral heels which he attributes to arthritis Saw podiatry and was given injections.  Reports no shortness of breath at rest no dyspnea on exertion.  He weighs himself at home routinely and reports home weight routinely 165 to 167 pounds.  Reports weight gain of 1 pound overnight but never 2 pounds overnight or 5 pounds in 1 week.  He takes his diuretic regimen as prescribed.  His weight today is 169lbs  compared to 159 lbs in our office one month ago.   EKGs/Labs/Other Studies Reviewed:   The following studies were reviewed today:  Echo 03/23/2020 1. Left ventricular ejection fraction, by estimation, is 60 to 65%. The  left ventricle has normal function. The left ventricle demonstrates global  hypokinesis. Left ventricular diastolic parameters are indeterminate.   2. Right ventricular systolic function is mildly reduced. The right  ventricular size is mildly enlarged. There is severely elevated pulmonary  artery systolic pressure. The estimated right ventricular systolic  pressure is 696.2 mmHg.   3. Left atrial size was mildly dilated.   4. Right atrial size was mildly dilated.   5. The mitral valve is grossly normal. Mild mitral valve regurgitation.   6. The aortic valve is tricuspid. Aortic valve regurgitation is trivial.  Mild aortic valve sclerosis is present, with no evidence of aortic valve  stenosis.   7. The inferior vena cava is dilated in size with <50% respiratory  variability, suggesting right atrial pressure of 15 mmHg.   EKG: No EKG today  Recent Labs: 04/03/2020: Magnesium 2.1 04/14/2020: ALT 15 04/30/2020: B Natriuretic Peptide 283.6 05/05/2020: Hemoglobin 8.0; Platelets 148 07/01/2020: BUN 31; Creat 1.03; Potassium 4.1; Sodium 131  Recent Lipid Panel    Component Value Date/Time   CHOL 75 03/24/2020 0434   CHOL 89 (L) 04/06/2015 1642   CHOL 150 06/06/2013 0947   TRIG 42 03/24/2020 0434   TRIG 85 06/06/2013 0947   HDL 21 (L) 03/24/2020 0434   HDL 40 04/06/2015 1642   HDL 43 06/06/2013 0947   CHOLHDL 3.6 03/24/2020 0434   VLDL 8 03/24/2020 0434   VLDL 17 06/06/2013 0947   LDLCALC 46 03/24/2020 0434   LDLCALC 36 04/06/2015 1642   LDLCALC 90 06/06/2013 0947    Home Medications   Current Meds  Medication Sig  . atorvastatin (LIPITOR) 80 MG tablet TAKE 1 TABLET BY MOUTH EVERY DAY  . Elastic Bandages & Supports (MEDICAL COMPRESSION SOCKS) MISC 1  application by Does not apply route daily.  . ferrous sulfate 325 (65 FE) MG tablet Take 1 tablet (325 mg total) by mouth daily.  . furosemide (LASIX) 20 MG tablet Take 1 tablet (20 mg total) by mouth daily.  Marland Kitchen spironolactone (ALDACTONE) 25 MG tablet Take 1 tablet (25 mg total) by mouth daily.    Review of Systems   All other systems reviewed and are otherwise negative except as noted above.  Physical Exam    VS:  BP (!) 158/80 (BP Location: Left Arm, Patient Position: Sitting, Cuff Size: Normal)   Pulse 72   Ht 5\' 11"  (1.803 m)   Wt 169 lb (76.7 kg)   SpO2 98%   BMI 23.57 kg/m  , BMI Body mass index is 23.57 kg/m. GEN: Thin, well developed, in no acute distress. HEENT: normal. Neck: Supple, no JVD, carotid bruits, or masses. Cardiac: irregularly irregular,  no murmurs, rubs, or gallops. No clubbing, cyanosis, edema. Radials//PT 2+ and equal bilaterally.  Respiratory:  Respirations regular and unlabored, clear to auscultation bilaterally. GI: Soft, nontender, nondistended MS: No deformity or atrophy. Skin: Warm and dry, no rash. Neuro:  Strength and sensation are intact. Psych: Normal affect.  Assessment & Plan    1. Permanent atrial fibrillation, bradycardia - ZIO 05/11/20 with 100% atrial fibrillation burden.  One 3-second pause which was asymptomatic.  No significant pause no indication for referral to EP at this time.  Rate is controlled today on exam.  Not presently on any rate limiting agents, due to previous bradycardia  2. Chronic anticoagulation/FOBT/anemia - He had positive FOBT in the hospital 03/2020 and his Xarelto was discontinued. He is high risk off of anticoagulation with CHADS2VASc score of 5 (agex2, HTN, CAD, HF).  Will defer reinitiation of Xarelto until he is seen by GI.  Upcoming appointment with Dr. Marius Ditch 07/18/2020 and encouraged to discuss when he would be permitted to resume anticoagulation.    3. HFpEF/Hyponatremia - Weight up 10 pounds in 1 month ago though  no dyspnea nor edema noted on exam.  07/01/2020 NA 131, creatinine 1.03. He has been referred to nephrology by his primary care provider.  Hesitant to increase diuretic regimen in the setting of hyponatremia.  Continue Lasix 20 mg daily and spironolactone 25 mg daily.  He will continue to weigh daily and report weight gain of 3 pounds overnight or 5 pounds in 1 week.  He will continue low-salt diet and fluid restriction.  4. CAD s/p CABG - No indication for ischemic evaluation at this time.  Denies anginal symptoms. No beta-blocker due to bradycardia. Continue present statin.  5. HTN - BP well controlled. Continue current antihypertensive regimen.   Disposition: Follow up in November as scheduled with [facility] HF clinic. Follow up in 2 month(s) with Dr. Rockey Situ or APP.  Loel Dubonnet, NP 07/07/2020, 1:44 PM

## 2020-07-08 ENCOUNTER — Telehealth: Payer: Self-pay | Admitting: Family Medicine

## 2020-07-08 DIAGNOSIS — D649 Anemia, unspecified: Secondary | ICD-10-CM

## 2020-07-08 MED ORDER — RIVAROXABAN 20 MG PO TABS: 20.0000 mg | ORAL_TABLET | Freq: Every day | ORAL | 5 refills | Status: DC

## 2020-07-08 MED ORDER — PANTOPRAZOLE SODIUM 40 MG PO TBEC: 40.0000 mg | DELAYED_RELEASE_TABLET | Freq: Every day | ORAL | 11 refills | Status: DC

## 2020-07-08 NOTE — Telephone Encounter (Signed)
Please let the patient know I received a message from GI.  They wanted him to have some labs for his anemia.  I placed orders.  Please get him scheduled to come in for these at his convenience in the next week or so.  Thanks.

## 2020-07-08 NOTE — Telephone Encounter (Signed)
Per request from Dr. Marius Ditch (GI)  Dr. Marius Ditch would like Mr. Redner to have lab work done prior to his appointment 07/18/20.  Recommended lab work includes CBC, iron panel, B12 and folate panel all for anemia.   Mr. Scheaffer has to arrange transportation and request through [employer]. I am not certain how far ahead he has to schedule, but if possible would like him to come next week to the [facility] for those labs.   Loel Dubonnet, NP

## 2020-07-08 NOTE — Addendum Note (Signed)
Addended by: Kavin Leech on: 07/08/2020 01:58 PM   Modules accepted: Orders

## 2020-07-08 NOTE — Telephone Encounter (Signed)
I called and spoke with the patient and scheduled a lab appointment to check his anemia.  Donell Tomkins,cma

## 2020-07-08 NOTE — Telephone Encounter (Signed)
-----   Message from [name], MD sent at 07/07/2020  5:46 PM EDT ----- Can you make sure to get the labs done as well  RV ----- Message ----- From: Loel Dubonnet, NP Sent: 07/07/2020   4:44 PM EDT To: [name], MD, [name], MD  Thank you for the recommendations! I have routed a note to our triage team and we will get the Xarelto and Protonix started.   Best.  Loel Dubonnet, NP ----- Message ----- From: [name], MD Sent: 07/07/2020   2:37 PM EDT To: [name], MD, Loel Dubonnet, NP  I do not recommend to hold Xarelto just for FOBT positive unless he has active bleeding. Please goahead and restart if it is necessary. I did not see any work-up of anemia done.  Appreciate labs such as iron panel, B12 and folate panel, Also good idea to start him on protinix 40mg  BID until we figure out source of anemia  I will see him on 11/8 as scheduled  Thanks Rohini Vanga ----- Message ----- From: Loel Dubonnet, NP Sent: 07/07/2020   2:02 PM EDT To: [name], MD  Hi Dr. Marius Ditch,  FYI Mr. Cerezo has a new patient appt with you 07/18/20. He was admitted 03/2020 with anemia, positive FOBT. His Xarelto was discontinued at that time. He has permanent atrial fib with CHADS2VASc of at least 5 (agex2, HTN, CAD, HF). He is still holding Xarelto, but any recommendations during your upcoming office visit much appreciated. We would like to resume when possible if safe.Thanks! Loel Dubonnet, NP

## 2020-07-08 NOTE — Telephone Encounter (Signed)
Spoke with patient and relayed recommendations from [facility] and Dr. Marius Ditch. Prescriptions sent in for Xarelto, and Protonix. Laurann Montana will notify Dr. Ellen Henri office to call patient to schedule his lab draw with them. Patient also stated that he would then call his transportation service regarding his ride to get a lab draw at Dr. Ellen Henri office.   Patient verbalized understanding and agreed with plan.

## 2020-07-11 DIAGNOSIS — E1122 Type 2 diabetes mellitus with diabetic chronic kidney disease: Secondary | ICD-10-CM | POA: Diagnosis not present

## 2020-07-11 DIAGNOSIS — E1151 Type 2 diabetes mellitus with diabetic peripheral angiopathy without gangrene: Secondary | ICD-10-CM | POA: Diagnosis not present

## 2020-07-11 DIAGNOSIS — N1831 Chronic kidney disease, stage 3a: Secondary | ICD-10-CM | POA: Diagnosis not present

## 2020-07-11 DIAGNOSIS — I4821 Permanent atrial fibrillation: Secondary | ICD-10-CM | POA: Diagnosis not present

## 2020-07-11 DIAGNOSIS — E871 Hypo-osmolality and hyponatremia: Secondary | ICD-10-CM | POA: Diagnosis not present

## 2020-07-11 DIAGNOSIS — D631 Anemia in chronic kidney disease: Secondary | ICD-10-CM | POA: Diagnosis not present

## 2020-07-11 DIAGNOSIS — I2510 Atherosclerotic heart disease of native coronary artery without angina pectoris: Secondary | ICD-10-CM | POA: Diagnosis not present

## 2020-07-11 DIAGNOSIS — I130 Hypertensive heart and chronic kidney disease with heart failure and stage 1 through stage 4 chronic kidney disease, or unspecified chronic kidney disease: Secondary | ICD-10-CM | POA: Diagnosis not present

## 2020-07-11 DIAGNOSIS — I5032 Chronic diastolic (congestive) heart failure: Secondary | ICD-10-CM | POA: Diagnosis not present

## 2020-07-11 DIAGNOSIS — E785 Hyperlipidemia, unspecified: Secondary | ICD-10-CM | POA: Diagnosis not present

## 2020-07-18 ENCOUNTER — Ambulatory Visit (INDEPENDENT_AMBULATORY_CARE_PROVIDER_SITE_OTHER): Payer: Medicare HMO | Admitting: Gastroenterology

## 2020-07-18 ENCOUNTER — Telehealth: Payer: Self-pay

## 2020-07-18 ENCOUNTER — Encounter: Payer: Self-pay | Admitting: Gastroenterology

## 2020-07-18 VITALS — BP 166/78 | HR 80 | Temp 98.1°F | Ht 71.0 in | Wt 171.1 lb

## 2020-07-18 DIAGNOSIS — I5030 Unspecified diastolic (congestive) heart failure: Secondary | ICD-10-CM

## 2020-07-18 DIAGNOSIS — R195 Other fecal abnormalities: Secondary | ICD-10-CM

## 2020-07-18 DIAGNOSIS — K7469 Other cirrhosis of liver: Secondary | ICD-10-CM | POA: Diagnosis not present

## 2020-07-18 DIAGNOSIS — D649 Anemia, unspecified: Secondary | ICD-10-CM | POA: Diagnosis not present

## 2020-07-18 DIAGNOSIS — R188 Other ascites: Secondary | ICD-10-CM | POA: Diagnosis not present

## 2020-07-18 MED ORDER — ALBUMIN HUMAN 25 % IV SOLN: INTRAVENOUS | 0 refills | Status: DC

## 2020-07-18 NOTE — Patient Instructions (Addendum)
Your Paracentesis is scheduled for 07/19/2020 [facility] at 12:30pm for a 1:00pm scan.   Low-Sodium Eating Plan Sodium, which is an element that makes up salt, helps you maintain a healthy balance of fluids in your body. Too much sodium can increase your blood pressure and cause fluid and waste to be held in your body. Your health care provider or dietitian may recommend following this plan if you have high blood pressure (hypertension), kidney disease, liver disease, or heart failure. Eating less sodium can help lower your blood pressure, reduce swelling, and protect your heart, liver, and kidneys. What are tips for following this plan? General guidelines  Most people on this plan should limit their sodium intake to 1,500-2,000 mg (milligrams) of sodium each day. Reading food labels   The Nutrition Facts label lists the amount of sodium in one serving of the food. If you eat more than one serving, you must multiply the listed amount of sodium by the number of servings.  Choose foods with less than 140 mg of sodium per serving.  Avoid foods with 300 mg of sodium or more per serving. Shopping  Look for lower-sodium products, often labeled as "low-sodium" or "no salt added."  Always check the sodium content even if foods are labeled as "unsalted" or "no salt added".  Buy fresh foods. ? Avoid canned foods and premade or frozen meals. ? Avoid canned, cured, or processed meats  Buy breads that have less than 80 mg of sodium per slice. Cooking  Eat more home-cooked food and less restaurant, buffet, and fast food.  Avoid adding salt when cooking. Use salt-free seasonings or herbs instead of table salt or sea salt. Check with your health care provider or pharmacist before using salt substitutes.  Cook with plant-based oils, such as canola, sunflower, or olive oil. Meal planning  When eating at a restaurant, ask that your food be prepared with less salt or no salt, if  possible.  Avoid foods that contain MSG (monosodium glutamate). MSG is sometimes added to [place] food, bouillon, and some canned foods. What foods are recommended? The items listed may not be a complete list. Talk with your dietitian about what dietary choices are best for you. Grains Low-sodium cereals, including oats, puffed wheat and rice, and shredded wheat. Low-sodium crackers. Unsalted rice. Unsalted pasta. Low-sodium bread. Whole-grain breads and whole-grain pasta. Vegetables Fresh or frozen vegetables. "No salt added" canned vegetables. "No salt added" tomato sauce and paste. Low-sodium or reduced-sodium tomato and vegetable juice. Fruits Fresh, frozen, or canned fruit. Fruit juice. Meats and other protein foods Fresh or frozen (no salt added) meat, poultry, seafood, and fish. Low-sodium canned tuna and salmon. Unsalted nuts. Dried peas, beans, and lentils without added salt. Unsalted canned beans. Eggs. Unsalted nut butters. Dairy Milk. Soy milk. Cheese that is naturally low in sodium, such as ricotta cheese, fresh mozzarella, or Swiss cheese Low-sodium or reduced-sodium cheese. Cream cheese. Yogurt. Fats and oils Unsalted butter. Unsalted margarine with no trans fat. Vegetable oils such as canola or olive oils. Seasonings and other foods Fresh and dried herbs and spices. Salt-free seasonings. Low-sodium mustard and ketchup. Sodium-free salad dressing. Sodium-free light mayonnaise. Fresh or refrigerated horseradish. Lemon juice. Vinegar. Homemade, reduced-sodium, or low-sodium soups. Unsalted popcorn and pretzels. Low-salt or salt-free chips. What foods are not recommended? The items listed may not be a complete list. Talk with your dietitian about what dietary choices are best for you. Grains Instant hot cereals. Bread stuffing, pancake, and  biscuit mixes. Croutons. Seasoned rice or pasta mixes. Noodle soup cups. Boxed or frozen macaroni and cheese. Regular salted crackers.  Self-rising flour. Vegetables Sauerkraut, pickled vegetables, and relishes. Olives. [place] fries. Onion rings. Regular canned vegetables (not low-sodium or reduced-sodium). Regular canned tomato sauce and paste (not low-sodium or reduced-sodium). Regular tomato and vegetable juice (not low-sodium or reduced-sodium). Frozen vegetables in sauces. Meats and other protein foods Meat or fish that is salted, canned, smoked, spiced, or pickled. Bacon, ham, sausage, hotdogs, corned beef, chipped beef, packaged lunch meats, salt pork, jerky, pickled herring, anchovies, regular canned tuna, sardines, salted nuts. Dairy Processed cheese and cheese spreads. Cheese curds. Blue cheese. Feta cheese. String cheese. Regular cottage cheese. Buttermilk. Canned milk. Fats and oils Salted butter. Regular margarine. Ghee. Bacon fat. Seasonings and other foods Onion salt, garlic salt, seasoned salt, table salt, and sea salt. Canned and packaged gravies. Worcestershire sauce. Tartar sauce. Barbecue sauce. Teriyaki sauce. Soy sauce, including reduced-sodium. Steak sauce. Fish sauce. Oyster sauce. Cocktail sauce. Horseradish that you find on the shelf. Regular ketchup and mustard. Meat flavorings and tenderizers. Bouillon cubes. Hot sauce and Tabasco sauce. Premade or packaged marinades. Premade or packaged taco seasonings. Relishes. Regular salad dressings. Salsa. Potato and tortilla chips. Corn chips and puffs. Salted popcorn and pretzels. Canned or dried soups. Pizza. Frozen entrees and pot pies. Summary  Eating less sodium can help lower your blood pressure, reduce swelling, and protect your heart, liver, and kidneys.  Most people on this plan should limit their sodium intake to 1,500-2,000 mg (milligrams) of sodium each day.  Canned, boxed, and frozen foods are high in sodium. Restaurant foods, fast foods, and pizza are also very high in sodium. You also get sodium by adding salt to food.  Try to cook at home, eat  more fresh fruits and vegetables, and eat less fast food, canned, processed, or prepared foods. This information is not intended to replace advice given to you by your health care provider. Make sure you discuss any questions you have with your health care provider. Document Revised: 08/09/2017 Document Reviewed: 08/20/2016 Elsevier Patient Education  2020 [employer].

## 2020-07-18 NOTE — Telephone Encounter (Signed)
° °  [facility] Pre-operative Risk Assessment    Request for surgical clearance:  1. What type of surgery is being performed? EGD and colonoscopy   2. When is this surgery scheduled? Near future    3. Are there any medications that need to be held prior to surgery and how long? Xarelto 18m. Patient states he is not taking medication at this time   4. Practice name and name of physician performing surgery? [facility] VMarius Ditch 5. What is your office phone and fax number?  [phone]  6. Anesthesia type (None, local, MAC, general) ? General    AShelby Mattocks11/04/2020, 3:11 PM  _________________________________________________________________   (provider comments below)

## 2020-07-18 NOTE — Progress Notes (Signed)
Cephas Darby, MD [address]  [place]  [place], [place] [address]  Main: [phone]  Fax: [phone]    Gastroenterology Consultation  Referring Provider:     [name], MD Primary Care Physician:  [name], MD Primary Gastroenterologist:  Dr. Cephas Darby Reason for Consultation:     Fecal occult positive, normocytic anemia        HPI:   Rande Roylance is a 79 y.o. male referred by Dr. Caryl Bis, Angela Adam, MD  for consultation & management of normocytic anemia.  Patient has history of diastolic congestive heart failure, EF of 60 to 65%, cirrhosis of liver is referred for further evaluation of chronic anemia.  His lowest hemoglobin was 7.6 in 03/2020 when he was hospitalized.  He had a CT abdomen and pelvis at that time which revealed cirrhosis of liver as well as large volume ascites with possible hemoperitoneum.  Apparently, paracentesis was not performed at that time he is known to be anemic since 10/2016, also in 2014, he does have macrocytosis.  He has history of A. fib, on Eliquis, history of pleural effusion, underwent thoracentesis.  Patient has history of pulmonary hypertension, coronary artery disease, s/p CABG, stage III CKD.  Patient was admitted to [facility] in July 2021 secondary to anasarca and has been aggressively diuresed.  He was admitted in August secondary to severe hyponatremia, treated with hypertonic saline.  Patient is also evaluated by palliative care.  Apparently patient's Eliquis was held during last hospitalization in 04/2020 secondary to high fall risk.  Patient was recently evaluated by cardiology, anticoagulation has been held due to positive FOBT in the hospital.  Patient has chronic thrombocytopenia, dating back to 05/2013 when his platelets were 136, they have been fluctuating to normal levels and slightly low to date.  Patient lives with his brother, he came to office visit by transportation today He does acknowledge eating  hamburger, chips regularly.  He does report that he feels significantly better on diuretics.  He does have swelling of legs as well as abdominal distention.  Patient denies abdominal pain, nausea, vomiting, hematemesis, coffee-ground emesis, melena, rectal bleeding  NSAIDs: None  Antiplts/Anticoagulants/Anti thrombotics: Eliquis for history of A. fib which has been held since 03/2020  GI Procedures: None  Past Medical History:  Diagnosis Date  . (HFpEF) heart failure with preserved ejection fraction ([place])   . CHF (congestive heart failure) ([place])   . Coronary artery disease   . Hyperlipidemia   . Hypertension   . PAH (pulmonary artery hypertension) ([place])   . Permanent atrial fibrillation ([place])   . Pleural effusion     Past Surgical History:  Procedure Laterality Date  . CARDIAC CATHETERIZATION    . CHOLECYSTECTOMY    . CORONARY ANGIOPLASTY  06/2004   s/p stent placement @ [facility]  . CORONARY ARTERY BYPASS GRAFT  04-24-2004   CABG x 3 [facility]    Current Outpatient Medications:  .  atorvastatin (LIPITOR) 80 MG tablet, TAKE 1 TABLET BY MOUTH EVERY DAY, Disp: 90 tablet, Rfl: 0 .  Elastic Bandages & Supports (MEDICAL COMPRESSION SOCKS) MISC, 1 application by Does not apply route daily., Disp: 2 each, Rfl: 0 .  ferrous sulfate 325 (65 FE) MG tablet, Take 1 tablet (325 mg total) by mouth daily., Disp: 30 tablet, Rfl: 3 .  furosemide (LASIX) 20 MG tablet, Take 1 tablet (20 mg total) by mouth daily., Disp: 90 tablet, Rfl: 1 .  pantoprazole (PROTONIX) 40 MG  tablet, Take 1 tablet (40 mg total) by mouth daily., Disp: 60 tablet, Rfl: 11 .  spironolactone (ALDACTONE) 25 MG tablet, Take 1 tablet (25 mg total) by mouth daily., Disp: 90 tablet, Rfl: 3 .  albumin human 25 % bottle, Inject 25 grams per every paracentesis more then 5 Liters removed then another 25 grams, Disp: 50 mL, Rfl: 0 .  rivaroxaban (XARELTO) 20 MG TABS tablet, Take 1 tablet (20 mg total) by mouth daily with supper. (Patient not taking:  Reported on 07/18/2020), Disp: 30 tablet, Rfl: 5   Family History  Problem Relation Age of Onset  . Heart disease Father   . Heart disease Paternal Uncle   . Heart attack Brother      Social History   Tobacco Use  . Smoking status: Never Smoker  . Smokeless tobacco: Never Used  Substance Use Topics  . Alcohol use: No  . Drug use: No    Allergies as of 07/18/2020 - Review Complete 07/18/2020  Allergen Reaction Noted  . Sulfa antibiotics Rash 06/23/2013  . Other Rash 03/07/2020    Review of Systems:    All systems reviewed and negative except where noted in HPI.   Physical Exam:  BP (!) 166/78 (BP Location: Left Arm, Patient Position: Sitting, Cuff Size: Normal)   Pulse 80   Temp 98.1 F (36.7 C) (Oral)   Ht 5\' 11"  (1.803 m)   Wt 171 lb 2 oz (77.6 kg)   BMI 23.87 kg/m  No LMP for male patient.  General:   Alert,  Well-developed, well-nourished, pleasant and cooperative in NAD Head:  Normocephalic and atraumatic. Eyes:  Sclera clear, no icterus.   Conjunctiva pink. Ears:  Normal auditory acuity. Nose:  No deformity, discharge, or lesions. Mouth:  No deformity or lesions,oropharynx pink & moist. Neck:  Supple; no masses or thyromegaly. Lungs:  Respirations even and unlabored.  Clear throughout to auscultation.   No wheezes, crackles, or rhonchi. No acute distress. Heart:  Regular rate and rhythm; no murmurs, clicks, rubs, or gallops. Abdomen:  Normal bowel sounds. Soft, non-tender and diffusely distended, dull to percussion without masses, hepatosplenomegaly or hernias noted.  No guarding or rebound tenderness.   Rectal: Not performed Msk:  Symmetrical without gross deformities. Good, equal movement & strength bilaterally. Pulses:  Normal pulses noted. Extremities:  No clubbing, 3+ edema.  No cyanosis. Neurologic:  Alert and oriented x3;  grossly normal neurologically. Skin: Dry scaly skin with several superficial abrasions in bilateral upper extremities and hands  and dried blood on the cut areas and separated skin Lymph Nodes:  No significant cervical adenopathy. Psych:  Alert and cooperative. Normal mood and affect.  Imaging Studies: Reviewed  Assessment and Plan:   Ardell Makarewicz is a 79 y.o. male with history of hypertension, hyperlipidemia, A. fib on Eliquis, pulmonary hypertension, diastolic heart failure is seen in consultation for normocytic anemia and FOBT positive  Normocytic anemia and FOBT positive Recommend iron panel, B12 and folate panel No evidence of active bleed at this time Patient would need upper endoscopy as well as colonoscopy to evaluate the source of bleeding after obtaining cardiac clearance  Cirrhosis of liver, unclear etiology Check viral hepatitis panel Ascites based on the CT scan and clinical exam, therefore recommend ultrasound paracentesis with fluid analysis to evaluate for portal hypertension He will also need EGD for variceal screening Mild thrombocytopenia, with no evidence of splenomegaly Continue low-dose Lasix and spironolactone as recommended by cardiology Check serum AFP levels  Follow up in 2 to 3 weeks   Cephas Darby, MD

## 2020-07-18 NOTE — Progress Notes (Signed)
error 

## 2020-07-19 ENCOUNTER — Telehealth: Payer: Self-pay | Admitting: Cardiovascular Disease

## 2020-07-19 ENCOUNTER — Telehealth: Payer: Self-pay | Admitting: Gastroenterology

## 2020-07-19 ENCOUNTER — Ambulatory Visit: Payer: Medicare HMO

## 2020-07-19 DIAGNOSIS — I5032 Chronic diastolic (congestive) heart failure: Secondary | ICD-10-CM | POA: Diagnosis not present

## 2020-07-19 DIAGNOSIS — E871 Hypo-osmolality and hyponatremia: Secondary | ICD-10-CM | POA: Diagnosis not present

## 2020-07-19 DIAGNOSIS — I4821 Permanent atrial fibrillation: Secondary | ICD-10-CM | POA: Diagnosis not present

## 2020-07-19 DIAGNOSIS — D631 Anemia in chronic kidney disease: Secondary | ICD-10-CM | POA: Diagnosis not present

## 2020-07-19 DIAGNOSIS — E785 Hyperlipidemia, unspecified: Secondary | ICD-10-CM | POA: Diagnosis not present

## 2020-07-19 DIAGNOSIS — E1151 Type 2 diabetes mellitus with diabetic peripheral angiopathy without gangrene: Secondary | ICD-10-CM | POA: Diagnosis not present

## 2020-07-19 DIAGNOSIS — I130 Hypertensive heart and chronic kidney disease with heart failure and stage 1 through stage 4 chronic kidney disease, or unspecified chronic kidney disease: Secondary | ICD-10-CM | POA: Diagnosis not present

## 2020-07-19 DIAGNOSIS — I2510 Atherosclerotic heart disease of native coronary artery without angina pectoris: Secondary | ICD-10-CM | POA: Diagnosis not present

## 2020-07-19 DIAGNOSIS — N1831 Chronic kidney disease, stage 3a: Secondary | ICD-10-CM | POA: Diagnosis not present

## 2020-07-19 DIAGNOSIS — E1122 Type 2 diabetes mellitus with diabetic chronic kidney disease: Secondary | ICD-10-CM | POA: Diagnosis not present

## 2020-07-19 LAB — CBC
Hematocrit: 28.1 % — ABNORMAL LOW (ref 37.5–51.0)
Hemoglobin: 9.8 g/dL — ABNORMAL LOW (ref 13.0–17.7)
MCH: 32.9 pg (ref 26.6–33.0)
MCHC: 34.9 g/dL (ref 31.5–35.7)
MCV: 94 fL (ref 79–97)
Platelets: 180 10*3/uL (ref 150–450)
RBC: 2.98 x10E6/uL — ABNORMAL LOW (ref 4.14–5.80)
RDW: 13.6 % (ref 11.6–15.4)
WBC: 6.8 10*3/uL (ref 3.4–10.8)

## 2020-07-19 LAB — HEPATITIS PANEL, ACUTE
Hep A IgM: NEGATIVE
Hep B C IgM: NEGATIVE
Hep C Virus Ab: <0.1 s/co ratio (ref 0.0–0.9)
Hepatitis B Surface Ag: NEGATIVE

## 2020-07-19 LAB — COMPREHENSIVE METABOLIC PANEL
ALT: 17 IU/L (ref 0–44)
AST: 31 IU/L (ref 0–40)
Albumin/Globulin Ratio: 0.8 — ABNORMAL LOW (ref 1.2–2.2)
Albumin: 3.9 g/dL (ref 3.7–4.7)
Alkaline Phosphatase: 110 IU/L (ref 44–121)
BUN/Creatinine Ratio: 25 — ABNORMAL HIGH (ref 10–24)
BUN: 31 mg/dL — ABNORMAL HIGH (ref 8–27)
Bilirubin Total: 1.3 mg/dL — ABNORMAL HIGH (ref 0.0–1.2)
CO2: 22 mmol/L (ref 20–29)
Calcium: 9.1 mg/dL (ref 8.6–10.2)
Chloride: 90 mmol/L — ABNORMAL LOW (ref 96–106)
Creatinine, Ser: 1.22 mg/dL (ref 0.76–1.27)
GFR calc Af Amer: 66 mL/min/{1.73_m2} (ref >59)
GFR calc non Af Amer: 57 mL/min/{1.73_m2} — ABNORMAL LOW (ref >59)
Globulin, Total: 4.6 g/dL — ABNORMAL HIGH (ref 1.5–4.5)
Glucose: 91 mg/dL (ref 65–99)
Potassium: 4.8 mmol/L (ref 3.5–5.2)
Sodium: 123 mmol/L — ABNORMAL LOW (ref 134–144)
Total Protein: 8.5 g/dL (ref 6.0–8.5)

## 2020-07-19 LAB — AFP TUMOR MARKER: AFP, Serum, Tumor Marker: 2.4 ng/mL (ref 0.0–8.3)

## 2020-07-19 LAB — IRON,TIBC AND FERRITIN PANEL
Ferritin: 161 ng/mL (ref 30–400)
Iron Saturation: 15 % (ref 15–55)
Iron: 53 ug/dL (ref 38–169)
Total Iron Binding Capacity: 343 ug/dL (ref 250–450)
UIBC: 290 ug/dL (ref 111–343)

## 2020-07-19 LAB — HIV ANTIBODY (ROUTINE TESTING W REFLEX): HIV Screen 4th Generation wRfx: NONREACTIVE

## 2020-07-19 LAB — B12 AND FOLATE PANEL
Folate: 4.3 ng/mL (ref >3.0)
Vitamin B-12: 730 pg/mL (ref 232–1245)

## 2020-07-19 MED ORDER — ALBUMIN HUMAN 25 % IV SOLN: INTRAVENOUS | 0 refills | Status: DC

## 2020-07-19 NOTE — Telephone Encounter (Signed)
   Primary Cardiologist: Ida Rogue, MD  Chart reviewed as part of pre-operative protocol coverage.   This is a duplicate request. Please see note from 07/18/20.  Abigail Butts, PA-C 07/19/2020, 4:53 PM

## 2020-07-19 NOTE — Telephone Encounter (Signed)
   Primary Cardiologist: Ida Rogue, MD  Chart reviewed as part of pre-operative protocol coverage. Patient was contacted 07/19/2020 in reference to pre-operative risk assessment for pending surgery as outlined below.  Octavious Fender Herder was last seen on 06/09/20 by Laurann Montana, NP.  Since that day, Tonya Carlile has done fine from a cardiac standpoint. He is quite limited in activity by generalized weakness and cannot complete 4 METs, though denies any anginal symptoms with his limited activities.  I will route to Dr. Rockey Situ for input on preoperative risk, though favor proceeding without further cardiac work-up in the setting of a low risk procedure.   Abigail Butts, PA-C 07/19/2020, 1:30 PM

## 2020-07-19 NOTE — Telephone Encounter (Signed)
    [facility] Pre-operative Risk Assessment    HEARTCARE STAFF: - Please ensure there is not already an duplicate clearance open for this procedure. - Under Visit Info/Reason for Call, type in Other and utilize the format Clearance MM/DD/YY or Clearance TBD. Do not use dashes or single digits. - If request is for dental extraction, please clarify the # of teeth to be extracted.  Request for surgical clearance:  1. What type of surgery is being performed? colonoscopy  2. When is this surgery scheduled? TBD  3. What type of clearance is required (medical clearance vs. Pharmacy clearance to hold med vs. Both)? pharm  4. Are there any medications that need to be held prior to surgery and how long?advise on xarelto  5. Practice name and name of physician performing surgery? [facility]   6. What is the office phone number? [phone]   7.   What is the office fax number? [place]   Anesthesia type (None, local, MAC, general) ? general   Jordan Perkins 07/19/2020, 12:25 PM  _________________________________________________________________   (provider comments below)

## 2020-07-19 NOTE — Telephone Encounter (Signed)
Patient states he could not get a ride so that is why he did not come for paracentesis today. Patient states he will have a ride next Tuesday. Called special procedures and they can do the paracentesis next Tuesday arrived at 12:30pm for a 1:00pm scan. Called patient and her verbalized understanding and states he will be there,. Pamala Hurry said I needed to reorder the albumin. Put new order in the system under medications

## 2020-07-19 NOTE — Telephone Encounter (Signed)
Radiology calling to let Dr. Marius Ditch know that pt did not show up for his paracentesis today. FYI

## 2020-07-20 ENCOUNTER — Other Ambulatory Visit: Payer: Medicare HMO

## 2020-07-20 ENCOUNTER — Telehealth: Payer: Self-pay | Admitting: Gastroenterology

## 2020-07-20 NOTE — Telephone Encounter (Signed)
-----   Message from Abigail Butts, PA-C sent at 07/20/2020  3:17 PM EST ----- Attached is the requested clearance for upcoming colonoscopy/endoscopy. Please call with questions.

## 2020-07-20 NOTE — Telephone Encounter (Signed)
   Primary Cardiologist: Ida Rogue, MD  Chart reviewed as part of pre-operative protocol coverage. Per Dr. Rockey Situ, Jordan Perkins would be at acceptable risk for the planned procedure without further cardiovascular testing, however, would delay procedure until after sodium level has improved to upper 120s.   Confirmed with patient that he has not been taking his xarelto at this time. If colonoscopy/endoscopy are benign, recommend restarting xarelto following this procedure for stroke ppx.   I will route this recommendation to the requesting party via Epic fax function and remove from pre-op pool.  Please call with questions.  Abigail Butts, PA-C 07/20/2020, 3:12 PM

## 2020-07-20 NOTE — Telephone Encounter (Signed)
-----   Message from Loel Dubonnet, NP sent at 07/20/2020  1:34 PM EST ----- Sounds perfect. I've routed a result note to our triage team so we will get the med changes made and his repeat BMP Tuesday. He has an appointment next Wednesday with Darylene Price, NP in the [facility] HF clinic so I've CC'd her as well so she is aware.  Best, Loel Dubonnet, NP ----- Message ----- From: [name], MD Sent: 07/20/2020   1:25 PM EST To: [name], MD, Loel Dubonnet, NP, #  Agree with the plan, appreciate the coordination of his care.  He is still volume overloaded when I saw him in the office, although he reports that he is significantly better.  Thanks RV ----- Message ----- From: Loel Dubonnet, NP Sent: 07/20/2020  12:52 PM EST To: [name], MD, [name], MD, #  Hi Dr. Marius Ditch,  I discussed with Dr. Rockey Situ. We would hesitate to increase his Aldactone due to concern for worsening hyponatremia and potential for hyperkalemia as his potassium high normal. Though he is on a small potassium supplement.   If the thought is that his diuretic regimen is ineffective precipitating fluid retention and subsequent hyponatremia, would favor transitioning from Lasix 20mg  QD to Torsemide 20mg  QD. Then repeat BMP promptly - potentially 07/26/20 on the date of his paracentesis. He also needs to free water restrict.  Let me know your thoughts. I'm happy to have our office call him with the medication changes & put in labs to be done at [facility] on date of paracentesis.   Best, Loel Dubonnet  ----- Message ----- From: [name], MD Sent: 07/20/2020  12:38 PM EST To: [name], MD, Loel Dubonnet, NP, #  Caitlin  His sodium levels are very low.  Wondering if you want to increase Aldactone to 50 mg daily  Thanks RV

## 2020-07-20 NOTE — Telephone Encounter (Signed)
Would probably wait until his sodium is back up Cannot imagine what a colon prep would potentially do Once sodium improved , at least high 120s, okay to hold Xarelto for procedure

## 2020-07-20 NOTE — Telephone Encounter (Signed)
Jordan Perkins, let us wait until his sodium levels are back up to 130s  RV

## 2020-07-22 ENCOUNTER — Telehealth: Payer: Self-pay | Admitting: Family Medicine

## 2020-07-22 ENCOUNTER — Telehealth: Payer: Self-pay

## 2020-07-22 DIAGNOSIS — I5032 Chronic diastolic (congestive) heart failure: Secondary | ICD-10-CM

## 2020-07-22 DIAGNOSIS — Z79899 Other long term (current) drug therapy: Secondary | ICD-10-CM

## 2020-07-22 NOTE — Telephone Encounter (Signed)
Rejection Reason - Patient was No Show" [place] said on Jul 22, 2020 9:15 AM

## 2020-07-22 NOTE — Telephone Encounter (Signed)
-----   Message from Loel Dubonnet, NP sent at 07/20/2020  1:34 PM EST ----- Discussed with Dr. Marius Ditch as well as Dr. Rockey Situ. Please contact the patient with the following recommendations:  Lab work shows worsening of his hyponatremia and high normal potassium. Kidney function is stable. Plan to continue Spironolactone 25mg  daily. Concern that his Lasix is not adequately removing fluid, as such stop Lasix and START Torsemide 20mg  QD. We will continue his current potassium supplement.   Also needs strict fluid restriction. Would minimize intake of PO fluids.   He will need repeat BMP at the [facility]. He may have it collected 07/26/20 when he is here for paracentesis. Please place orders. Please confirm with Mr. Hardenbrook that he has called & arranged his transportation for Tuesday.   Mr. Beneke is very kind but has had confusion with medications in the past, please ensure that he understands the changes.

## 2020-07-22 NOTE — Telephone Encounter (Signed)
Called the patient to give results and Laurann Montana, NP recommendation. Unable to lmom. Patients voiced mail is not set up.

## 2020-07-25 MED ORDER — TORSEMIDE 20 MG PO TABS: 20.0000 mg | ORAL_TABLET | Freq: Every day | ORAL | 3 refills | Status: DC

## 2020-07-25 NOTE — Telephone Encounter (Signed)
Spoke to pt. Notified of lab results and [employer] recc. Discussed in detail with pt med changes. He will STOP Lasix and START Torsemide 20mg  QD. He will continue Spironolactone 25mg  QD and Potassium supplement. Pt will now have BMP repeated next week at the [facility] since he will start med changes today. Pt verbalized understanding of changes and that he will have labs at [facility] next week. Rx sent to pharmacy for Torsemide and lab orders placed. Pt did confirm that he has transportation set up for 11/16. He confirmed that he will also call now to set up transportation for his lab work to be done next Monday 11/22.  Advised pt to call our office with any questions in the meantime.

## 2020-07-25 NOTE — Telephone Encounter (Signed)
-----   Message from Loel Dubonnet, NP sent at 07/20/2020  1:34 PM EST ----- Discussed with Dr. Marius Ditch as well as Dr. Rockey Situ. Please contact the patient with the following recommendations:  Lab work shows worsening of his hyponatremia and high normal potassium. Kidney function is stable. Plan to continue Spironolactone 25mg  daily. Concern that his Lasix is not adequately removing fluid, as such stop Lasix and START Torsemide 20mg  QD. We will continue his current potassium supplement.   Also needs strict fluid restriction. Would minimize intake of PO fluids.   He will need repeat BMP at the [facility]. He may have it collected 07/26/20 when he is here for paracentesis. Please place orders. Please confirm with Mr. Gahm that he has called & arranged his transportation for Tuesday.   Mr. Fray is very kind but has had confusion with medications in the past, please ensure that he understands the changes.

## 2020-07-26 ENCOUNTER — Other Ambulatory Visit: Payer: Self-pay

## 2020-07-26 ENCOUNTER — Telehealth: Payer: Self-pay

## 2020-07-26 ENCOUNTER — Ambulatory Visit
Admission: RE | Admit: 2020-07-26 | Discharge: 2020-07-26 | Disposition: A | Discharge status: Home / Self Care | Payer: Medicare HMO | Source: Ambulatory Visit | Attending: Gastroenterology | Admitting: Gastroenterology

## 2020-07-26 DIAGNOSIS — R188 Other ascites: Secondary | ICD-10-CM | POA: Diagnosis not present

## 2020-07-26 LAB — BODY FLUID CELL COUNT WITH DIFFERENTIAL
Eos, Fluid: 0 %
Lymphs, Fluid: 18 %
Monocyte-Macrophage-Serous Fluid: 74 %
Neutrophil Count, Fluid: 7 %
Total Nucleated Cell Count, Fluid: 3287 cu mm

## 2020-07-26 LAB — ALBUMIN, PLEURAL OR PERITONEAL FLUID: Albumin, Fluid: 2.4 g/dL

## 2020-07-26 MED ORDER — ALBUMIN HUMAN 25 % IV SOLN
INTRAVENOUS | Status: AC
  Administered 2020-07-26: 25 g via INTRAVENOUS
  Filled 2020-07-26: qty 200

## 2020-07-26 MED ORDER — ALBUMIN HUMAN 25 % IV SOLN: 25.0000 g | Freq: Once | INTRAVENOUS | Status: AC

## 2020-07-26 NOTE — Telephone Encounter (Signed)
937 am.  Phone call made to Theresa-niece to complete a telephonic visit.  Patient is scheduled to have a thoracentesis today.  Niece reports patient always states he is doing fine and does not typically have any complaints.  Patient does not complain of shortness of breath but mostly sits in his recliner chair watching television.  He will occasionally cook and wash dishes.  Family is coming in on the weekends to complete household chores and provide meals that last a few days.  Patient is currently getting Meals on Wheels but does not eat them.  Meals are typically placed in the freezer by patient and family has to later throw them out.   No falls are reported.  Patient is using a walker for safe ambulation.  Food intake has been very good and no weight loss is noted.  Family is working to ensure patient has low sodium food due to his CHF diagnosis. I asked if it would be okay to contact patient directly in the next couple of week to schedule an in person visit.  Clarene Critchley states patient always keeps his phone on him and he would likely be agreeable to a visit.  Clarene Critchley states he would be good for the patient to have "another set of eyes on him".  No other questions or concerns voiced at this time.

## 2020-07-26 NOTE — Procedures (Addendum)
PROCEDURE SUMMARY:  Successful image-guided paracentesis from the right upper abdomen.  Yielded 600 milliliters of clear, golden yellow fluid.  No immediate complications.  EBL: zero Patient tolerated well.   Specimen was sent for labs.  Please see imaging section of Epic for full dictation.  Joaquim Nam PA-C 07/26/2020 1:30 PM

## 2020-07-27 ENCOUNTER — Other Ambulatory Visit: Payer: Self-pay | Admitting: *Deleted

## 2020-07-27 ENCOUNTER — Other Ambulatory Visit: Payer: Self-pay | Admitting: Family Medicine

## 2020-07-27 ENCOUNTER — Telehealth: Payer: Self-pay | Admitting: *Deleted

## 2020-07-27 ENCOUNTER — Telehealth: Payer: Self-pay

## 2020-07-27 ENCOUNTER — Ambulatory Visit: Payer: Medicare HMO | Attending: Family | Admitting: Family

## 2020-07-27 ENCOUNTER — Encounter: Payer: Self-pay | Admitting: Family

## 2020-07-27 VITALS — BP 143/52 | HR 66 | Resp 16 | Ht 71.0 in | Wt 161.1 lb

## 2020-07-27 DIAGNOSIS — I5032 Chronic diastolic (congestive) heart failure: Secondary | ICD-10-CM

## 2020-07-27 DIAGNOSIS — I10 Essential (primary) hypertension: Secondary | ICD-10-CM

## 2020-07-27 DIAGNOSIS — R140 Abdominal distension (gaseous): Secondary | ICD-10-CM | POA: Insufficient documentation

## 2020-07-27 DIAGNOSIS — Z79899 Other long term (current) drug therapy: Secondary | ICD-10-CM | POA: Diagnosis not present

## 2020-07-27 DIAGNOSIS — Z8249 Family history of ischemic heart disease and other diseases of the circulatory system: Secondary | ICD-10-CM | POA: Diagnosis not present

## 2020-07-27 DIAGNOSIS — I2721 Secondary pulmonary arterial hypertension: Secondary | ICD-10-CM | POA: Insufficient documentation

## 2020-07-27 DIAGNOSIS — E785 Hyperlipidemia, unspecified: Secondary | ICD-10-CM | POA: Diagnosis not present

## 2020-07-27 DIAGNOSIS — Z882 Allergy status to sulfonamides status: Secondary | ICD-10-CM | POA: Diagnosis not present

## 2020-07-27 DIAGNOSIS — Z951 Presence of aortocoronary bypass graft: Secondary | ICD-10-CM | POA: Diagnosis not present

## 2020-07-27 DIAGNOSIS — I110 Hypertensive heart disease with heart failure: Secondary | ICD-10-CM | POA: Insufficient documentation

## 2020-07-27 DIAGNOSIS — E871 Hypo-osmolality and hyponatremia: Secondary | ICD-10-CM | POA: Insufficient documentation

## 2020-07-27 DIAGNOSIS — I2510 Atherosclerotic heart disease of native coronary artery without angina pectoris: Secondary | ICD-10-CM | POA: Diagnosis not present

## 2020-07-27 LAB — BASIC METABOLIC PANEL
Anion gap: 10 (ref 5–15)
BUN: 37 mg/dL — ABNORMAL HIGH (ref 8–23)
CO2: 23 mmol/L (ref 22–32)
Calcium: 9.5 mg/dL (ref 8.9–10.3)
Chloride: 100 mmol/L (ref 98–111)
Creatinine, Ser: 1.08 mg/dL (ref 0.61–1.24)
GFR, Estimated: >60 mL/min (ref >60)
Glucose, Bld: 134 mg/dL — ABNORMAL HIGH (ref 70–99)
Potassium: 3.9 mmol/L (ref 3.5–5.1)
Sodium: 133 mmol/L — ABNORMAL LOW (ref 135–145)

## 2020-07-27 MED ORDER — FERROUS SULFATE 325 (65 FE) MG PO TABS: 325.0000 mg | ORAL_TABLET | Freq: Every day | ORAL | 3 refills | Status: DC

## 2020-07-27 NOTE — Telephone Encounter (Signed)
Tried to call patient but voicemail has not been set up. Unable to leave a voicemail

## 2020-07-27 NOTE — Patient Instructions (Signed)
Pick up the diuretic and iron at [facility] after 4 today

## 2020-07-27 NOTE — Telephone Encounter (Signed)
-----   Message from Loel Dubonnet, NP sent at 07/27/2020  3:38 PM EST ----- Sodium improved back to his baseline. Renal function stable. He has not yet picked up Torsemide, but said he would pick up upon this afternoon when seen at [facility] HF clinic earlier today. He also needs to pick up his ferrous suflate. Continue with current plan of Torsemide 20mg  QD.  I have CC'd Dr. Marius Ditch just as Juluis Rainier.

## 2020-07-27 NOTE — Progress Notes (Signed)
Patient ID: Jordan Perkins, male    DOB: 21-Sep-1940, 80 y.o.   MRN: [id number]  Congestive Heart Failure Associated symptoms include shortness of breath (Improving). Pertinent negatives include no abdominal pain, chest pain, fatigue or palpitations.    Jordan Perkins is a 79 y/o male with a history of hyperlipidemia, HTN, PAH, atrial fibrillation and chronic heart failure.   Echo report from 03/23/20 reviewed and showed an EF of 60-65% along with severely elevated PA pressure, mild LAE and mild Jordan.   Admitted 04/30/20 due to weakness and lower extremity edema. Found to have low sodium at 119. Nephrology and palliative care consults obtained. Treated w/ 3% saline and then tolvaptan x 2 with improvement of sodium. PT/OT evaluations done. Discharged after 5 days.   He presents today for his follow up visit with a chief complaint of abdominal distention. Patient reports he had a paracentesis yesterday. Patient still has some abdominal swelling but has not had his diuretic today as he is going to pick up Torsemide. Patient reports his support system of his brother, neighbor, and niece have been helping him. Patient denies SOB, chest pain, dizziness, N/V/D.     Past Medical History:  Diagnosis Date  . (HFpEF) heart failure with preserved ejection fraction ([place])   . CHF (congestive heart failure) ([place])   . Coronary artery disease   . Hyperlipidemia   . Hypertension   . PAH (pulmonary artery hypertension) ([place])   . Permanent atrial fibrillation ([place])   . Pleural effusion    Past Surgical History:  Procedure Laterality Date  . CARDIAC CATHETERIZATION    . CHOLECYSTECTOMY    . CORONARY ANGIOPLASTY  06/2004   s/p stent placement @ [facility]  . 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 Heart disease Father   . Heart disease Paternal Uncle   . Heart attack Brother    Social History   Tobacco Use  . Smoking status: Never Smoker   . Smokeless tobacco: Never Used  Substance Use Topics  . Alcohol use: No   Allergies  Allergen Reactions  . Sulfa Antibiotics Rash    Rash/hives   . Other Rash   Prior to Admission medications   Medication Sig Start Date End Date Taking? Authorizing Provider  atorvastatin (LIPITOR) 80 MG tablet TAKE 1 TABLET BY MOUTH EVERY DAY 03/28/20  Yes Gollan, Kathlene November, MD  ferrous sulfate 325 (65 FE) MG tablet Take 1 tablet (325 mg total) by mouth daily. 04/06/20 04/06/21 Yes Fritzi Mandes, MD  spironolactone (ALDACTONE) 25 MG tablet Take 1 tablet (25 mg total) by mouth daily. 04/14/20  Yes Loel Dubonnet, NP  lisinopril (ZESTRIL) 40 MG tablet Take 1 tablet (40 mg total) by mouth daily. 01/02/19 05/11/20  [name], MD    Review of Systems  Constitutional: Positive for appetite change (PAtient reports he does not always have an appetite). Negative for fatigue.  HENT: Negative for congestion and sore throat.   Eyes: Negative.   Respiratory: Positive for shortness of breath (Improving). Negative for cough and chest tightness.   Cardiovascular: Negative for chest pain, palpitations and leg swelling.  Gastrointestinal: Negative for abdominal distention and abdominal pain.  Endocrine: Negative.   Genitourinary: Negative.   Musculoskeletal: Negative for back pain and neck pain.  Skin: Negative.   Allergic/Immunologic: Negative.   Neurological: Negative for dizziness, light-headedness and headaches.  Hematological: Negative for adenopathy.  Does not bruise/bleed easily.  Psychiatric/Behavioral: Negative for dysphoric mood and sleep disturbance (sleeping on 2 pillows). The patient is not nervous/anxious.     Vitals:   07/27/20 1253  BP: (!) 143/52  Pulse: 66  Resp: 16  SpO2: 99%  Weight: 161 lb 2 oz (73.1 kg)  Height: 5\' 11"  (1.803 m)   Wt Readings from Last 3 Encounters:  07/27/20 161 lb 2 oz (73.1 kg)  07/18/20 171 lb 2 oz (77.6 kg)  07/07/20 169 lb (76.7 kg)   Lab Results   Component Value Date   CREATININE 1.22 07/18/2020   CREATININE 1.03 07/01/2020   CREATININE 1.11 06/27/2020   Physical Exam Vitals and nursing note reviewed.  Constitutional:      Appearance: Normal appearance.  HENT:     Head: Normocephalic and atraumatic.  Cardiovascular:     Rate and Rhythm: Normal rate and regular rhythm.  Pulmonary:     Effort: Pulmonary effort is normal. No respiratory distress.     Breath sounds: No wheezing or rales.  Abdominal:     General: There is distension (Recent paracentesis).     Palpations: Abdomen is soft. There is no splenomegaly.     Tenderness: There is no abdominal tenderness.  Musculoskeletal:        General: No tenderness.     Cervical back: Normal range of motion and neck supple.     Right lower leg: Edema (Trace pitting) present.     Left lower leg: Edema (Trace pitting) present.  Skin:    General: Skin is warm and dry.  Neurological:     Mental Status: He is alert and oriented to person, place, and time. Mental status is at baseline.  Psychiatric:        Mood and Affect: Mood normal.        Behavior: Behavior normal.     Assessment & Plan:  1: Chronic heart failure with preserved ejection fraction with structural changes (LAE)- - NYHA class II - euvolemic today - says that he weighs daily; reminded to call for any overnight weight gain of > 2 pounds or a weekly weight gain of > 5 pounds - not adding salt - currently has PT/ nursing coming in twice week - saw cardiology Gilford Rile) 07/07/20; message to her about today's visit - Spoke with palliative care 07/26/20 - BNP 04/30/20 was 283.6 - received both moderna covid vaccines -Spoke to patient's niece on the phone and she reports she will make sure she gets his Torsemide today as well as his iron refill.  - paracentesis completed yesterday  2: HTN- - BP looks good today - saw PCP Caryl Bis) 07/01/20  3: Hyponatremia- - BMP 07/18/20 reviewed and showed sodium 123,  potassium 4.8, creatinine 1.22 and GFR 57 -Patient currently going through medication adjustments, sent for BMP re-draw today and it is in process   Medication bottles reviewed.   Return in 9 weeks or sooner for any questions/problems before then.

## 2020-07-27 NOTE — Telephone Encounter (Signed)
Tried to call patient but voicemail is not set up  

## 2020-07-27 NOTE — Telephone Encounter (Signed)
-----   Message from [name], MD sent at 07/27/2020  4:23 PM EST ----- Jordan Perkins can go ahead and schedule upper endoscopy for variceal screening DX: History of cirrhosis  RV

## 2020-07-27 NOTE — Telephone Encounter (Signed)
The patient has been notified of the result and verbalized understanding.  All questions were answered. Pt confirmed that he DID pick up his Torsemide and will continue current plan Torsemide 20mg  QD. He is also going to pick up ferrous sulfate today as well that was sent in by his PCP.

## 2020-07-28 ENCOUNTER — Other Ambulatory Visit: Payer: Self-pay

## 2020-07-28 ENCOUNTER — Other Ambulatory Visit: Payer: Self-pay | Admitting: *Deleted

## 2020-07-28 DIAGNOSIS — Z8719 Personal history of other diseases of the digestive system: Secondary | ICD-10-CM

## 2020-07-28 DIAGNOSIS — I8500 Esophageal varices without bleeding: Secondary | ICD-10-CM

## 2020-07-28 LAB — CYTOLOGY - NON PAP

## 2020-07-28 NOTE — Patient Outreach (Signed)
[facility]) Care Management  07/28/2020  Jordan Perkins 05-14-1941 [id number]   [facility] outreach to complex care patient Jordan Perkins was referred to [facility] on 04/05/20 for MD referral for Referral Reason:Heart failure would benefit from outpatient case management through [facility] and then on 04/11/20 for EMMI generalred alert for not knowing who to call about changes in condition and issues with wound healing- Resolved EMMI on 04/13/20 And recently on 05/11/20 for EMMI general for 05/10/20 1001Know who to call about changes in condition? No Scheduled follow-up? No Lost interest in things? Yes  Insurance:[employer] medicare and medicaid [facility] admissions x1ED visits x 1in the last 6 months Last admission from [facility] 04/30/20-05/05/20 hyponatremia CHF 03/23/20 to 04/06/20 anasarca, bradycardia, acute on chronic diastoliccongestive Heart Failure (CHF)     Follow up  congestive Heart Failure (CHF)  "Got fluid out of my stomach on yesterday" 07/27/20 "about a pint or more out"  He continues to weigh daily  weight (wt) this am 165 lbs  [facility] RN CM discussed CHF actin plan for > 3 lbs He states he generally does not get more than a lb even on yesterday    Compression hose  Has not received compression hose yet This was discussed on 06/27/20 with staff at Dr Jordan Perkins office who reported [facility] medical may be able to assist  He reports no swelling of his legs today  He denies shortness of breath (sob) and chest pain   pain of ankles  He reports a discussion of Arthritis He continues to report his feet/ankles are still sore with ambulation    Missing Appointments and transportation to appointments [facility] SW referral completed, pt now with medicaid transportation but barrier remains as he forgets appointment dates and when to call for rides Still has assist from his neighbor, Jordan Perkins prn He has been putting all of his after summary visit (discharge  sheets) in a location he can find as they have his future appointment dates, times, provider and address  He has not obtained a calendar to write in all his appointments so he can check it daily yet He has been encouraged to have his family assist with calling to Medicaid transportation to schedule his appointments as far out as a month He has been encouraged to call medicaid transportation [phone] after each appointment to schedule transportation for the next appointment  He has given permission for [facility] RN CM to outreach to his niece, Jordan Perkins to discuss this  Today [facility] RN CM assisted him to reschedule an appointment for 08/17/20 with Dr Jordan Perkins (he has an endoscopy on the same day). Spoke Jordan Perkins at Dr Jordan Bis to reschedule his 08/17/20 appointment to 12/21//21 1230  He was also assisted by [facility] RN CM to schedule medicaid transportation for all of his future appointments up until 08/30/20 (one month out)     Outreach to his niece Jordan Perkins to discuss her beginning to assist him with all other future appointments. Reviewed the process and medicaid transportation number provided. Informed her [facility] RN CM assisted with all appointments up to 08/30/20. She will assist with other appointments after 08/30/20 Discussed with her that his action plan is to put all office visit after summary visit (discharge sheets) in one location as these have all information about his upcoming appointments. She voiced understanding and appreciation. She was able to repeat back all pertinent information     Plans  [facility] RN CM will follow up with Jordan Perkins  within the next 30 business days Pt encouraged to return a call to [facility] RN CM prn Routed note to MD Goals      Patient Stated   .  [facility]) Eat Healthy (pt-stated)      Follow Up Date 08/30/20    - change to whole grain breads, cereal, pasta - drink 6 to 8 glasses of water each day - fill half of plate with vegetables - limit fast food meals to no more than 1 per week -  prepare main meal at home 3 to 5 days each week - set a realistic goal      Notes: 06/13/20 eating better, watching sodium reports having a vegetable plate today     .  [facility]) Follow My Treatment Plan (pt-stated)      Follow Up Date 08/30/20    - keep follow-up appointments - keep taking my medicines, even when I feel good     Notes:     .  [facility]) Learn More About My Health (pt-stated)      Follow Up Date 08/30/20    - ask questions - repeat what I heard to make sure I understand - bring a list of my medicines to the visit   Notes:     .  [facility]) Make and Keep All Appointments (pt-stated)      Follow Up Date 08/30/20   - arrange a ride through an agency 1 week before appointment - ask family or friend for a ride - call to cancel if needed - keep a calendar with appointment dates      Notes:     .  [facility]) Track and Manage Fluids and Swelling (pt-stated)      Follow Up Date 08/30/20    - call office if I gain more than 2 pounds in one day or 5 pounds in one week - keep legs up while sitting - use salt in moderation - watch for swelling in feet, ankles and legs every day - weigh myself daily     Notes: 07/28/20 had paracentesis on 07/27/20 continues to weigh but with minimal weight increases     Jordan Perkins L. Jordan Hamman, RN, BSN, [place] Coordinator Office number [phone] [facility] number [phone] Fax number [phone]

## 2020-07-28 NOTE — Telephone Encounter (Signed)
Patient states he can do EGD on 08/17/2020. Informed patient he would have to go for COVID test on 08/15/2020. Went over instructions with patient and mailed them to patient

## 2020-07-29 ENCOUNTER — Telehealth: Payer: Self-pay

## 2020-07-29 DIAGNOSIS — R195 Other fecal abnormalities: Secondary | ICD-10-CM

## 2020-07-29 MED ORDER — NA SULFATE-K SULFATE-MG SULF 17.5-3.13-1.6 GM/177ML PO SOLN: 354.0000 mL | Freq: Once | ORAL | 0 refills | Status: AC

## 2020-07-29 NOTE — Telephone Encounter (Signed)
Dr. Marius Ditch wanted to add on a colonoscopy to his EGD for his positive occult blood test stool. Informed patient he verbalized understanding. Informed patient I would send him instructions in the mail and went over them with patient. Sent prep to the pharmacy

## 2020-07-30 LAB — BODY FLUID CULTURE: Culture: NO GROWTH

## 2020-07-31 DIAGNOSIS — N1831 Chronic kidney disease, stage 3a: Secondary | ICD-10-CM | POA: Diagnosis not present

## 2020-07-31 DIAGNOSIS — I2510 Atherosclerotic heart disease of native coronary artery without angina pectoris: Secondary | ICD-10-CM | POA: Diagnosis not present

## 2020-07-31 DIAGNOSIS — E871 Hypo-osmolality and hyponatremia: Secondary | ICD-10-CM | POA: Diagnosis not present

## 2020-07-31 DIAGNOSIS — I130 Hypertensive heart and chronic kidney disease with heart failure and stage 1 through stage 4 chronic kidney disease, or unspecified chronic kidney disease: Secondary | ICD-10-CM | POA: Diagnosis not present

## 2020-07-31 DIAGNOSIS — E785 Hyperlipidemia, unspecified: Secondary | ICD-10-CM | POA: Diagnosis not present

## 2020-07-31 DIAGNOSIS — I4821 Permanent atrial fibrillation: Secondary | ICD-10-CM | POA: Diagnosis not present

## 2020-07-31 DIAGNOSIS — E1122 Type 2 diabetes mellitus with diabetic chronic kidney disease: Secondary | ICD-10-CM | POA: Diagnosis not present

## 2020-07-31 DIAGNOSIS — E1151 Type 2 diabetes mellitus with diabetic peripheral angiopathy without gangrene: Secondary | ICD-10-CM | POA: Diagnosis not present

## 2020-07-31 DIAGNOSIS — D631 Anemia in chronic kidney disease: Secondary | ICD-10-CM | POA: Diagnosis not present

## 2020-07-31 DIAGNOSIS — I5032 Chronic diastolic (congestive) heart failure: Secondary | ICD-10-CM | POA: Diagnosis not present

## 2020-07-31 NOTE — Progress Notes (Signed)
Will cc preop pool

## 2020-08-02 DIAGNOSIS — N1831 Chronic kidney disease, stage 3a: Secondary | ICD-10-CM | POA: Diagnosis not present

## 2020-08-02 DIAGNOSIS — E785 Hyperlipidemia, unspecified: Secondary | ICD-10-CM | POA: Diagnosis not present

## 2020-08-02 DIAGNOSIS — E1122 Type 2 diabetes mellitus with diabetic chronic kidney disease: Secondary | ICD-10-CM | POA: Diagnosis not present

## 2020-08-02 DIAGNOSIS — I5032 Chronic diastolic (congestive) heart failure: Secondary | ICD-10-CM | POA: Diagnosis not present

## 2020-08-02 DIAGNOSIS — I4821 Permanent atrial fibrillation: Secondary | ICD-10-CM | POA: Diagnosis not present

## 2020-08-02 DIAGNOSIS — I130 Hypertensive heart and chronic kidney disease with heart failure and stage 1 through stage 4 chronic kidney disease, or unspecified chronic kidney disease: Secondary | ICD-10-CM | POA: Diagnosis not present

## 2020-08-02 DIAGNOSIS — D631 Anemia in chronic kidney disease: Secondary | ICD-10-CM | POA: Diagnosis not present

## 2020-08-02 DIAGNOSIS — E1151 Type 2 diabetes mellitus with diabetic peripheral angiopathy without gangrene: Secondary | ICD-10-CM | POA: Diagnosis not present

## 2020-08-02 DIAGNOSIS — E871 Hypo-osmolality and hyponatremia: Secondary | ICD-10-CM | POA: Diagnosis not present

## 2020-08-02 DIAGNOSIS — I2510 Atherosclerotic heart disease of native coronary artery without angina pectoris: Secondary | ICD-10-CM | POA: Diagnosis not present

## 2020-08-08 ENCOUNTER — Ambulatory Visit (INDEPENDENT_AMBULATORY_CARE_PROVIDER_SITE_OTHER): Payer: Medicare HMO | Admitting: Gastroenterology

## 2020-08-08 ENCOUNTER — Encounter: Payer: Self-pay | Admitting: Gastroenterology

## 2020-08-08 ENCOUNTER — Other Ambulatory Visit: Payer: Self-pay

## 2020-08-08 VITALS — BP 160/68 | HR 76 | Temp 97.5°F | Ht 71.0 in | Wt 156.0 lb

## 2020-08-08 DIAGNOSIS — Z8719 Personal history of other diseases of the digestive system: Secondary | ICD-10-CM

## 2020-08-08 DIAGNOSIS — R195 Other fecal abnormalities: Secondary | ICD-10-CM | POA: Diagnosis not present

## 2020-08-08 DIAGNOSIS — R188 Other ascites: Secondary | ICD-10-CM | POA: Diagnosis not present

## 2020-08-08 NOTE — Progress Notes (Signed)
Cephas Darby, MD [address]  [place]  [place], [place] [address]  Main: [phone]  Fax: [phone]    Gastroenterology Consultation  Referring Provider:     [name], MD Primary Care Physician:  [name], MD Primary Gastroenterologist:  Dr. Cephas Darby Reason for Consultation:     Fecal occult positive, normocytic anemia        HPI:   Jordan Perkins is a 79 y.o. male referred by Dr. Caryl Bis, Angela Adam, MD  for consultation & management of normocytic anemia.  Patient has history of diastolic congestive heart failure, EF of 60 to 65%, cirrhosis of liver is referred for further evaluation of chronic anemia.  His lowest hemoglobin was 7.6 in 03/2020 when he was hospitalized.  He had a CT abdomen and pelvis at that time which revealed cirrhosis of liver as well as large volume ascites with possible hemoperitoneum.  Apparently, paracentesis was not performed at that time he is known to be anemic since 10/2016, also in 2014, he does have macrocytosis.  He has history of A. fib, on Eliquis, history of pleural effusion, underwent thoracentesis.  Patient has history of pulmonary hypertension, coronary artery disease, s/p CABG, stage III CKD.  Patient was admitted to [facility] in July 2021 secondary to anasarca and has been aggressively diuresed.  He was admitted in August secondary to severe hyponatremia, treated with hypertonic saline.  Patient is also evaluated by palliative care.  Apparently patient's Eliquis was held during last hospitalization in 04/2020 secondary to high fall risk.  Patient was recently evaluated by cardiology, anticoagulation has been held due to positive FOBT in the hospital.  Patient has chronic thrombocytopenia, dating back to 05/2013 when his platelets were 136, they have been fluctuating to normal levels and slightly low to date.  Patient lives with his brother, he came to office visit by transportation today He does acknowledge eating  hamburger, chips regularly.  He does report that he feels significantly better on diuretics.  He does have swelling of legs as well as abdominal distention.  Patient denies abdominal pain, nausea, vomiting, hematemesis, coffee-ground emesis, melena, rectal bleeding  Follow-up visit 08/09/2020 Patient reports feeling significantly better.  His anemia is improving.  He is no longer experiencing swelling of legs with adjustment of his diuretics.  His abdominal distention has also improved.  He is trying to cut back on sodium.  He underwent paracentesis, about 600 mL of clear fluid removed.  He does not have any complaints today.  NSAIDs: None  Antiplts/Anticoagulants/Anti thrombotics: Eliquis for history of A. fib which has been held since 03/2020  GI Procedures: None  Past Medical History:  Diagnosis Date  . (HFpEF) heart failure with preserved ejection fraction ([place])   . CHF (congestive heart failure) ([place])   . Coronary artery disease   . Hyperlipidemia   . Hypertension   . PAH (pulmonary artery hypertension) ([place])   . Permanent atrial fibrillation ([place])   . Pleural effusion     Past Surgical History:  Procedure Laterality Date  . CARDIAC CATHETERIZATION    . CHOLECYSTECTOMY    . CORONARY ANGIOPLASTY  06/2004   s/p stent placement @ [facility]  . CORONARY ARTERY BYPASS GRAFT  04-24-2004   CABG x 3 [facility]    Current Outpatient Medications:  .  atorvastatin (LIPITOR) 80 MG tablet, TAKE 1 TABLET BY MOUTH EVERY DAY, Disp: 90 tablet, Rfl: 0 .  Elastic Bandages & Supports (MEDICAL COMPRESSION SOCKS) MISC,  1 application by Does not apply route daily., Disp: 2 each, Rfl: 0 .  ferrous sulfate 325 (65 FE) MG tablet, Take 1 tablet (325 mg total) by mouth daily., Disp: 30 tablet, Rfl: 3 .  furosemide (LASIX) 20 MG tablet, Take 1 tablet (20 mg total) by mouth daily., Disp: 90 tablet, Rfl: 1 .  pantoprazole (PROTONIX) 40 MG tablet, Take 1 tablet (40 mg total) by mouth daily., Disp: 60 tablet, Rfl: 11 .   spironolactone (ALDACTONE) 25 MG tablet, Take 1 tablet (25 mg total) by mouth daily., Disp: 90 tablet, Rfl: 3 .  albumin human 25 % bottle, Inject 25 grams per every paracentesis more then 5 Liters removed then another 25 grams, Disp: 50 mL, Rfl: 0 .  rivaroxaban (XARELTO) 20 MG TABS tablet, Take 1 tablet (20 mg total) by mouth daily with supper. (Patient not taking: Reported on 07/18/2020), Disp: 30 tablet, Rfl: 5   Family History  Problem Relation Age of Onset  . Heart disease Father   . Heart disease Paternal Uncle   . Heart attack Brother      Social History   Tobacco Use  . Smoking status: Never Smoker  . Smokeless tobacco: Never Used  Substance Use Topics  . Alcohol use: No  . Drug use: No    Allergies as of 07/18/2020 - Review Complete 07/18/2020  Allergen Reaction Noted  . Sulfa antibiotics Rash 06/23/2013  . Other Rash 03/07/2020    Review of Systems:    All systems reviewed and negative except where noted in HPI.   Physical Exam:  BP (!) 166/78 (BP Location: Left Arm, Patient Position: Sitting, Cuff Size: Normal)   Pulse 80   Temp 98.1 F (36.7 C) (Oral)   Ht 5\' 11"  (1.803 m)   Wt 171 lb 2 oz (77.6 kg)   BMI 23.87 kg/m  No LMP for male patient.  General:   Alert,  Well-developed, well-nourished, pleasant and cooperative in NAD Head:  Normocephalic and atraumatic. Eyes:  Sclera clear, no icterus.   Conjunctiva pink. Ears:  Normal auditory acuity. Nose:  No deformity, discharge, or lesions. Mouth:  No deformity or lesions,oropharynx pink & moist. Neck:  Supple; no masses or thyromegaly. Lungs:  Respirations even and unlabored.  Clear throughout to auscultation.   No wheezes, crackles, or rhonchi. No acute distress. Heart:  Regular rate and rhythm; no murmurs, clicks, rubs, or gallops. Abdomen:  Normal bowel sounds. Soft, non-tender and diffusely distended, dull to percussion without masses, hepatosplenomegaly or hernias noted.  No guarding or rebound  tenderness.   Rectal: Not performed Msk:  Symmetrical without gross deformities. Good, equal movement & strength bilaterally. Pulses:  Normal pulses noted. Extremities:  No clubbing, 3+ edema.  No cyanosis. Neurologic:  Alert and oriented x3;  grossly normal neurologically. Skin: Dry scaly skin with several superficial abrasions in bilateral upper extremities and hands and dried blood on the cut areas and separated skin Lymph Nodes:  No significant cervical adenopathy. Psych:  Alert and cooperative. Normal mood and affect.  Imaging Studies: Reviewed  Assessment and Plan:   Xander Jutras is a 79 y.o. male with history of hypertension, hyperlipidemia, A. fib on Eliquis, pulmonary hypertension, diastolic heart failure is seen in consultation for normocytic anemia and FOBT positive  Normocytic anemia and FOBT positive: Improving Iron panel, B12 and folate panel are normal No evidence of active bleed at this time Patient is scheduled for upper endoscopy as well as colonoscopy on 08/17/2020 Anticoagulation has been  held at this time  Cirrhosis of liver, unclear etiology  viral hepatitis panel negative Paracentesis revealed no evidence of SBP, SAAG greater than 1.1, however, total protein levels were not assessed.  Etiology of ascites is secondary to cirrhosis.  But, unclear if it is secondary to portal hypertension or cardiac origin without knowing ascites total protein levels.  Scheduled for EGD for variceal screening on 12/8 No evidence of thrombocytopenia, with no evidence of splenomegaly Currently on torsemide and spironolactone as recommended by cardiology, hyponatremia has resolved [place] screening: No evidence of liver lesions on CT with contrast in 7/21, serum AFP levels are normal.  Recommend ultrasound of right upper quadrant in 7/22   Follow up in 2 to 3 months   Cephas Darby, MD

## 2020-08-09 LAB — HEMOGLOBIN: Hemoglobin: 10.8 g/dL — ABNORMAL LOW (ref 13.0–17.7)

## 2020-08-15 ENCOUNTER — Other Ambulatory Visit: Payer: Medicare HMO

## 2020-08-15 ENCOUNTER — Telehealth: Payer: Self-pay

## 2020-08-15 NOTE — Telephone Encounter (Signed)
Called patient to see if patient went for COVID testing today. Patient states he did not because he did not have a ride. Asked patient to go in the morning before 11 and they could still do his procedure on Wednesday. He verbalized understanding

## 2020-08-16 ENCOUNTER — Other Ambulatory Visit: Payer: Self-pay

## 2020-08-16 ENCOUNTER — Other Ambulatory Visit
Admission: RE | Admit: 2020-08-16 | Discharge: 2020-08-16 | Disposition: A | Discharge status: Home / Self Care | Payer: Medicare HMO | Source: Ambulatory Visit | Attending: Gastroenterology | Admitting: Gastroenterology

## 2020-08-16 DIAGNOSIS — Z20822 Contact with and (suspected) exposure to covid-19: Secondary | ICD-10-CM | POA: Insufficient documentation

## 2020-08-16 DIAGNOSIS — Z01812 Encounter for preprocedural laboratory examination: Secondary | ICD-10-CM | POA: Diagnosis not present

## 2020-08-17 ENCOUNTER — Ambulatory Visit: Payer: Medicare HMO | Admitting: Family Medicine

## 2020-08-17 ENCOUNTER — Telehealth: Payer: Self-pay

## 2020-08-17 LAB — SARS CORONAVIRUS 2 (TAT 6-24 HRS): SARS Coronavirus 2: NEGATIVE

## 2020-08-17 MED ORDER — NA SULFATE-K SULFATE-MG SULF 17.5-3.13-1.6 GM/177ML PO SOLN: 354.0000 mL | Freq: Once | ORAL | 0 refills | Status: AC

## 2020-08-17 NOTE — Telephone Encounter (Signed)
Patient did not show up for his colonoscopy and EGD today. Called patient and he said his ride did not show up. Asked patient if he could go on 08/23/2020 he states he will make it work. He states he took his prep last night so I will send him a new prep to the pharmacy he verbalized understanding. He states he will go pick it up. Informed Trish and  Updated the referral

## 2020-08-19 ENCOUNTER — Other Ambulatory Visit: Admission: RE | Admit: 2020-08-19 | Payer: Medicare HMO | Source: Ambulatory Visit

## 2020-08-23 ENCOUNTER — Ambulatory Visit: Payer: Medicare HMO | Admitting: Anesthesiology

## 2020-08-23 ENCOUNTER — Encounter: Payer: Self-pay | Admitting: Gastroenterology

## 2020-08-23 ENCOUNTER — Other Ambulatory Visit: Payer: Self-pay

## 2020-08-23 ENCOUNTER — Encounter
Admission: RE | Disposition: A | Discharge status: Home / Self Care | Payer: Self-pay | Source: Home / Self Care | Attending: Gastroenterology

## 2020-08-23 ENCOUNTER — Ambulatory Visit
Admission: RE | Admit: 2020-08-23 | Discharge: 2020-08-23 | Disposition: A | Discharge status: Home / Self Care | Payer: Medicare HMO | Attending: Gastroenterology | Admitting: Gastroenterology

## 2020-08-23 DIAGNOSIS — Z79899 Other long term (current) drug therapy: Secondary | ICD-10-CM | POA: Insufficient documentation

## 2020-08-23 DIAGNOSIS — R195 Other fecal abnormalities: Secondary | ICD-10-CM

## 2020-08-23 DIAGNOSIS — K644 Residual hemorrhoidal skin tags: Secondary | ICD-10-CM | POA: Diagnosis not present

## 2020-08-23 DIAGNOSIS — I8500 Esophageal varices without bleeding: Secondary | ICD-10-CM

## 2020-08-23 DIAGNOSIS — D125 Benign neoplasm of sigmoid colon: Secondary | ICD-10-CM | POA: Insufficient documentation

## 2020-08-23 DIAGNOSIS — Z8719 Personal history of other diseases of the digestive system: Secondary | ICD-10-CM

## 2020-08-23 DIAGNOSIS — K7460 Unspecified cirrhosis of liver: Secondary | ICD-10-CM | POA: Insufficient documentation

## 2020-08-23 DIAGNOSIS — K648 Other hemorrhoids: Secondary | ICD-10-CM | POA: Diagnosis not present

## 2020-08-23 DIAGNOSIS — E785 Hyperlipidemia, unspecified: Secondary | ICD-10-CM | POA: Diagnosis not present

## 2020-08-23 DIAGNOSIS — K635 Polyp of colon: Secondary | ICD-10-CM | POA: Diagnosis not present

## 2020-08-23 DIAGNOSIS — D120 Benign neoplasm of cecum: Secondary | ICD-10-CM | POA: Diagnosis not present

## 2020-08-23 DIAGNOSIS — Z882 Allergy status to sulfonamides status: Secondary | ICD-10-CM | POA: Insufficient documentation

## 2020-08-23 DIAGNOSIS — I110 Hypertensive heart disease with heart failure: Secondary | ICD-10-CM | POA: Diagnosis not present

## 2020-08-23 DIAGNOSIS — I509 Heart failure, unspecified: Secondary | ICD-10-CM | POA: Diagnosis not present

## 2020-08-23 HISTORY — PX: ESOPHAGOGASTRODUODENOSCOPY (EGD) WITH PROPOFOL: SHX5813

## 2020-08-23 HISTORY — PX: COLONOSCOPY WITH PROPOFOL: SHX5780

## 2020-08-23 SURGERY — ESOPHAGOGASTRODUODENOSCOPY (EGD) WITH PROPOFOL
Anesthesia: General

## 2020-08-23 MED ORDER — EPHEDRINE SULFATE 50 MG/ML IJ SOLN
INTRAMUSCULAR | Status: DC | PRN
  Administered 2020-08-23: 5 mg via INTRAVENOUS

## 2020-08-23 MED ORDER — PROPOFOL 10 MG/ML IV BOLUS
INTRAVENOUS | Status: DC | PRN
  Administered 2020-08-23: 70 mg via INTRAVENOUS

## 2020-08-23 MED ORDER — PROPOFOL 500 MG/50ML IV EMUL
INTRAVENOUS | Status: DC | PRN
  Administered 2020-08-23: 140 ug/kg/min via INTRAVENOUS

## 2020-08-23 MED ORDER — LIDOCAINE HCL (CARDIAC) PF 100 MG/5ML IV SOSY
PREFILLED_SYRINGE | INTRAVENOUS | Status: DC | PRN
  Administered 2020-08-23: 80 mg via INTRAVENOUS

## 2020-08-23 MED ORDER — SODIUM CHLORIDE 0.9 % IV SOLN: INTRAVENOUS | Status: DC

## 2020-08-23 NOTE — Op Note (Signed)
[facility] Gastroenterology Patient Name: Jordan Perkins Procedure Date: 08/23/2020 10:05 AM MRN: [id number] Account #: [id number] Date of Birth: 10-Nov-1940 Admit Type: Outpatient Age: 79 Room: [facility] ENDO ROOM 3 Gender: Male Note Status: Finalized Procedure:             Upper GI endoscopy Indications:           Cirrhosis rule out esophageal varices Providers:             [name] MD, MD Referring MD:          Angela Adam. Caryl Bis (Referring MD) Medicines:             General Anesthesia Complications:         No immediate complications. Estimated blood loss: None. Procedure:             Pre-Anesthesia Assessment:                        - Prior to the procedure, a History and Physical was                         performed, and patient medications and allergies were                         reviewed. The patient is competent. The risks and                         benefits of the procedure and the sedation options and                         risks were discussed with the patient. All questions                         were answered and informed consent was obtained.                         Patient identification and proposed procedure were                         verified by the physician, the nurse, the                         anesthesiologist, the anesthetist and the technician                         in the pre-procedure area in the procedure room in the                         endoscopy suite. Mental Status Examination: alert and                         oriented. Airway Examination: normal oropharyngeal                         airway and neck mobility. Respiratory Examination:                         clear to auscultation. CV Examination: normal.  Prophylactic Antibiotics: The patient does not require                         prophylactic antibiotics. Prior Anticoagulants: The                         patient has taken no previous  anticoagulant or                         antiplatelet agents. ASA Grade Assessment: III - A                         patient with severe systemic disease. After reviewing                         the risks and benefits, the patient was deemed in                         satisfactory condition to undergo the procedure. The                         anesthesia plan was to use general anesthesia.                         Immediately prior to administration of medications,                         the patient was re-assessed for adequacy to receive                         sedatives. The heart rate, respiratory rate, oxygen                         saturations, blood pressure, adequacy of pulmonary                         ventilation, and response to care were monitored                         throughout the procedure. The physical status of the                         patient was re-assessed after the procedure.                        After obtaining informed consent, the endoscope was                         passed under direct vision. Throughout the procedure,                         the patient's blood pressure, pulse, and oxygen                         saturations were monitored continuously. The Endoscope                         was introduced through the mouth, and advanced to the  second part of duodenum. The upper GI endoscopy was                         accomplished without difficulty. The patient tolerated                         the procedure well. Findings:      The duodenal bulb and second portion of the duodenum were normal.      The entire examined stomach was normal.      The cardia and gastric fundus were normal on retroflexion.      Esophagogastric landmarks were identified: the gastroesophageal junction       was found at 40 cm from the incisors.      The gastroesophageal junction and examined esophagus were normal. Impression:            - Normal duodenal  bulb and second portion of the                         duodenum.                        - Normal stomach.                        - Esophagogastric landmarks identified.                        - Normal gastroesophageal junction and esophagus.                        - No specimens collected. Recommendation:        - Discharge patient to home (with escort).                        - Resume previous diet today.                        - Continue present medications.                        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 All rights reserved. The codes documented in this  report are preliminary and upon coder review may  be revised to meet current compliance requirements. Dr. Ulyess Mort [name] MD, MD 08/23/2020 10:16:49 AM This report has been signed electronically. Number of Addenda: 0 Note Initiated On: 08/23/2020 10:05 AM Estimated Blood Loss:  Estimated blood loss: none.      [facility]

## 2020-08-23 NOTE — Transfer of Care (Signed)
Immediate Anesthesia Transfer of Care Note  Patient: Jordan Perkins  Procedure(s) Performed: ESOPHAGOGASTRODUODENOSCOPY (EGD) WITH PROPOFOL (N/A ) COLONOSCOPY WITH PROPOFOL (N/A )  Patient Location: PACU  Anesthesia Type:General  Level of Consciousness: sedated  Airway & Oxygen Therapy: Patient Spontanous Breathing and Patient connected to face mask oxygen  Post-op Assessment: Report given to RN and Post -op Vital signs reviewed and stable  Post vital signs: Reviewed and stable  Last Vitals:  Vitals Value Taken Time  BP    Temp    Pulse    Resp    SpO2      Last Pain:  Vitals:   08/23/20 0933  TempSrc: Temporal  PainSc: 0-No pain         Complications: No complications documented.

## 2020-08-23 NOTE — Op Note (Signed)
[facility] Gastroenterology Patient Name: Jordan Perkins Procedure Date: 08/23/2020 10:05 AM MRN: [id number] Account #: [id number] Date of Birth: Dec 17, 1940 Admit Type: Outpatient Age: 79 Room: [facility] ENDO ROOM 3 Gender: Male Note Status: Finalized Procedure:             Colonoscopy Indications:           Positive fecal immunochemical test Providers:             [name] MD, MD Referring MD:          Angela Adam. Caryl Bis (Referring MD) Medicines:             General Anesthesia Complications:         No immediate complications. Estimated blood loss: None. Procedure:             Pre-Anesthesia Assessment:                        - Prior to the procedure, a History and Physical was                         performed, and patient medications and allergies were                         reviewed. The patient is competent. The risks and                         benefits of the procedure and the sedation options and                         risks were discussed with the patient. All questions                         were answered and informed consent was obtained.                         Patient identification and proposed procedure were                         verified by the physician, the nurse, the                         anesthesiologist, the anesthetist and the technician                         in the pre-procedure area in the procedure room in the                         endoscopy suite. Mental Status Examination: alert and                         oriented. Airway Examination: normal oropharyngeal                         airway and neck mobility. Respiratory Examination:                         clear to auscultation. CV Examination: normal.  Prophylactic Antibiotics: The patient does not require                         prophylactic antibiotics. Prior Anticoagulants: The                         patient has taken no previous anticoagulant or                          antiplatelet agents. ASA Grade Assessment: III - A                         patient with severe systemic disease. After reviewing                         the risks and benefits, the patient was deemed in                         satisfactory condition to undergo the procedure. The                         anesthesia plan was to use general anesthesia.                         Immediately prior to administration of medications,                         the patient was re-assessed for adequacy to receive                         sedatives. The heart rate, respiratory rate, oxygen                         saturations, blood pressure, adequacy of pulmonary                         ventilation, and response to care were monitored                         throughout the procedure. The physical status of the                         patient was re-assessed after the procedure.                        After obtaining informed consent, the colonoscope was                         passed under direct vision. Throughout the procedure,                         the patient's blood pressure, pulse, and oxygen                         saturations were monitored continuously. The                         Colonoscope was introduced through the anus and  advanced to the the cecum, identified by appendiceal                         orifice and ileocecal valve. The colonoscopy was                         performed without difficulty. The patient tolerated                         the procedure well. The quality of the bowel                         preparation was evaluated using the BBPS [facility] Bowel                         Preparation Scale) with scores of: Right Colon = 2                         (minor amount of residual staining, small fragments of                         stool and/or opaque liquid, but mucosa seen well),                         Transverse Colon = 2 (minor  amount of residual                         staining, small fragments of stool and/or opaque                         liquid, but mucosa seen well) and Left Colon = 3                         (entire mucosa seen well with no residual staining,                         small fragments of stool or opaque liquid). The total                         BBPS score equals 7. Findings:      The perianal and digital rectal examinations were normal. Pertinent       negatives include normal sphincter tone and no palpable rectal lesions.      A diminutive polyp was found in the cecum. The polyp was sessile. The       polyp was removed with a cold biopsy forceps. Resection and retrieval       were complete.      A 5 mm polyp was found in the sigmoid colon. The polyp was sessile. The       polyp was removed with a cold snare. Resection and retrieval were       complete.      Non-bleeding external hemorrhoids were found during retroflexion. The       hemorrhoids were medium-sized. Impression:            - One diminutive polyp in the cecum, removed with a  cold biopsy forceps. Resected and retrieved.                        - One 5 mm polyp in the sigmoid colon, removed with a                         cold snare. Resected and retrieved.                        - Non-bleeding external hemorrhoids. Recommendation:        - Discharge patient to home (with escort).                        - Resume previous diet today.                        - Continue present medications.                        - Await pathology results.                        - Repeat colonoscopy in 5 years for surveillance. Procedure Code(s):     --- Professional ---                        [id number], Colonoscopy, flexible; with removal of                         tumor(s), polyp(s), or other lesion(s) by snare                         technique                        45380, 59, Colonoscopy, flexible; with biopsy, single                          or multiple Diagnosis Code(s):     --- Professional ---                        K63.5, Polyp of colon                        K64.4, Residual hemorrhoidal skin tags                        R19.5, Other fecal abnormalities CPT copyright 2019 American Medical Association. All rights reserved. The codes documented in this report are preliminary and upon coder review may  be revised to meet current compliance requirements. Dr. Ulyess Mort [name] MD, MD 08/23/2020 10:35:14 AM This report has been signed electronically. Number of Addenda: 0 Note Initiated On: 08/23/2020 10:05 AM Scope Withdrawal Time: 0 hours 9 minutes 45 seconds  Total Procedure Duration: 0 hours 12 minutes 54 seconds  Estimated Blood Loss:  Estimated blood loss: none.      [facility]

## 2020-08-23 NOTE — H&P (Signed)
Jordan Darby, MD [address]  [place]  [place], [place] [address]  Main: [phone]  Fax: [phone] Pager: [phone]  Primary Care Physician:  Jordan Haven, MD Primary Gastroenterologist:  Dr. Cephas Perkins  Pre-Procedure History & Physical: HPI:  Jordan Perkins is a 79 y.o. male is here for an endoscopy and colonoscopy.   Past Medical History:  Diagnosis Date  . (HFpEF) heart failure with preserved ejection fraction ([place])   . CHF (congestive heart failure) ([place])   . Coronary artery disease   . Hyperlipidemia   . Hypertension   . PAH (pulmonary artery hypertension) ([place])   . Permanent atrial fibrillation ([place])   . Pleural effusion     Past Surgical History:  Procedure Laterality Date  . CARDIAC CATHETERIZATION    . CHOLECYSTECTOMY    . CORONARY ANGIOPLASTY  06/2004   s/p stent placement @ [facility]  . CORONARY ARTERY BYPASS GRAFT  04-24-2004   CABG x 3 [facility]    Prior to Admission medications   Medication Sig Start Date End Date Taking? Authorizing Provider  atorvastatin (LIPITOR) 80 MG tablet TAKE 1 TABLET BY MOUTH EVERY DAY 06/30/20  Yes Jordan Perkins November, MD  Elastic Bandages & Supports (MEDICAL COMPRESSION SOCKS) MISC 1 application by Does not apply route daily. 06/27/20  Yes Jordan Haven, MD  ferrous sulfate 325 (65 FE) MG tablet Take 1 tablet (325 mg total) by mouth daily. 07/27/20 07/27/21 Yes Jordan Haven, MD  pantoprazole (PROTONIX) 40 MG tablet Take 1 tablet (40 mg total) by mouth daily. 07/08/20  Yes Jordan Dubonnet, NP  spironolactone (ALDACTONE) 25 MG tablet Take 1 tablet (25 mg total) by mouth daily. 07/04/20  Yes Jordan Haven, MD  torsemide (DEMADEX) 20 MG tablet Take 1 tablet (20 mg total) by mouth daily. 07/25/20  Yes Jordan Dubonnet, NP  albumin human 25 % bottle Inject 25 grams per every paracentesis more then 5 Liters removed then another 25 grams Patient not taking: No sig reported 07/19/20   Jordan Landsman, MD  rivaroxaban (XARELTO) 20 MG TABS tablet Take 1 tablet (20 mg total) by mouth daily with supper. Patient not taking: No sig reported 07/08/20   Jordan Dubonnet, NP    Allergies as of 07/28/2020 - Review Complete 07/28/2020  Allergen Reaction Noted  . Sulfa antibiotics Rash 06/23/2013  . Other Rash 03/07/2020    Family History  Problem Relation Age of Onset  . Heart disease Father   . Heart disease Paternal Uncle   . Heart attack Brother     Social History   Socioeconomic History  . Marital status: Single    Spouse name: Not on file  . Number of children: Not on file  . Years of education: Not on file  . Highest education level: Not on file  Occupational History  . Not on file  Tobacco Use  . Smoking status: Never Smoker  . Smokeless tobacco: Never Used  Vaping Use  . Vaping Use: Never used  Substance and Sexual Activity  . Alcohol use: No  . Drug use: No  . Sexual activity: Not on file  Other Topics Concern  . Not on file  Social History Narrative  . Not on file   Social Determinants of Health   Financial Resource Strain: Not on file  Food Insecurity: No Food Insecurity  . Worried About Charity fundraiser in the Last Year: Never true  . Ran Out  of Food in the Last Year: Never true  Transportation Needs: Unmet Transportation Needs  . Lack of Transportation (Medical): Yes  . Lack of Transportation (Non-Medical): No  Physical Activity: Unknown  . Days of Exercise per Week: 0 days  . Minutes of Exercise per Session: Not on file  Stress: Not on file  Social Connections: Not on file  Intimate Partner Violence: Not At Risk  . Fear of Current or Ex-Partner: No  . Emotionally Abused: No  . Physically Abused: No  . Sexually Abused: No    Review of Systems: See HPI, otherwise negative ROS  Physical Exam: BP (!) 145/78   Pulse 67   Temp (!) 97.2 F (36.2 C) (Temporal)   Resp 18   Ht 5\' 11"  (1.803 m)   Wt 74.8 kg   SpO2 100%   BMI 23.01 kg/m   General:   Alert,  pleasant and cooperative in NAD Head:  Normocephalic and atraumatic. Neck:  Supple; no masses or thyromegaly. Lungs:  Clear throughout to auscultation.    Heart:  Regular rate and rhythm. Abdomen:  Soft, nontender and nondistended. Normal bowel sounds, without guarding, and without rebound.   Neurologic:  Alert and  oriented x4;  grossly normal neurologically.  Impression/Plan: Jordan Perkins is here for an endoscopy and colonoscopy to be performed for h/o cirrhosis, FOBT+  Risks, benefits, limitations, and alternatives regarding  endoscopy and colonoscopy have been reviewed with the patient.  Questions have been answered.  All parties agreeable.   Sherri Sear, MD  08/23/2020, 10:01 AM

## 2020-08-23 NOTE — Anesthesia Postprocedure Evaluation (Signed)
Anesthesia Post Note  Patient: Jordan Perkins  Procedure(s) Performed: ESOPHAGOGASTRODUODENOSCOPY (EGD) WITH PROPOFOL (N/A ) COLONOSCOPY WITH PROPOFOL (N/A )  Patient location during evaluation: Endoscopy Anesthesia Type: General Level of consciousness: awake and alert Pain management: pain level controlled Vital Signs Assessment: post-procedure vital signs reviewed and stable Respiratory status: spontaneous breathing, nonlabored ventilation, respiratory function stable and patient connected to nasal cannula oxygen Cardiovascular status: blood pressure returned to baseline and stable Postop Assessment: no apparent nausea or vomiting Anesthetic complications: no   No complications documented.   Last Vitals:  Vitals:   08/23/20 1046 08/23/20 1058  BP: 127/61 133/62  Pulse: (!) 50 62  Resp: 12 15  Temp:    SpO2: 100% 100%    Last Pain:  Vitals:   08/23/20 1058  TempSrc:   PainSc: 0-No pain                 Precious Haws Rajah Tagliaferro

## 2020-08-23 NOTE — Anesthesia Preprocedure Evaluation (Signed)
Anesthesia Evaluation  Patient identified by MRN, date of birth, ID band Patient awake    Reviewed: Allergy & Precautions, H&P , NPO status , Patient's Chart, lab work & pertinent test results  History of Anesthesia Complications Negative for: history of anesthetic complications  Airway Mallampati: III  TM Distance: <3 FB Neck ROM: limited    Dental  (+) Poor Dentition, Missing   Pulmonary neg shortness of breath,    Pulmonary exam normal        Cardiovascular Exercise Tolerance: Good hypertension, (-) angina+ CAD, + Peripheral Vascular Disease and +CHF  Normal cardiovascular exam     Neuro/Psych negative neurological ROS  negative psych ROS   GI/Hepatic negative GI ROS, neg GERD  ,(+) Cirrhosis       ,   Endo/Other  negative endocrine ROS  Renal/GU Renal disease  negative genitourinary   Musculoskeletal   Abdominal   Peds  Hematology negative hematology ROS (+)   Anesthesia Other Findings Past Medical History: No date: (HFpEF) heart failure with preserved ejection fraction ([facility]) No date: CHF (congestive heart failure) ([facility]) No date: Coronary artery disease No date: Hyperlipidemia No date: Hypertension No date: PAH (pulmonary artery hypertension) ([place]) No date: Permanent atrial fibrillation ([place]) No date: Pleural effusion  Past Surgical History: No date: CARDIAC CATHETERIZATION No date: CHOLECYSTECTOMY 06/2004: CORONARY ANGIOPLASTY     Comment:  s/p stent placement @ [facility] 04-24-2004: CORONARY ARTERY BYPASS GRAFT     Comment:  CABG x 3 [facility]  BMI    Body Mass Index: 23.01 kg/m      Reproductive/Obstetrics negative OB ROS                             Anesthesia Physical Anesthesia Plan  ASA: III  Anesthesia Plan: General   Post-op Pain Management:    Induction: Intravenous  PONV Risk Score and Plan: Propofol infusion and TIVA  Airway Management Planned: Natural  Airway and Simple Face Mask  Additional Equipment:   Intra-op Plan:   Post-operative Plan:   Informed Consent: I have reviewed the patients History and Physical, chart, labs and discussed the procedure including the risks, benefits and alternatives for the proposed anesthesia with the patient or authorized representative who has indicated his/her understanding and acceptance.     Dental Advisory Given  Plan Discussed with: Anesthesiologist, CRNA and Surgeon  Anesthesia Plan Comments: (Patient consented for risks of anesthesia including but not limited to:  - adverse reactions to medications - risk of airway placement if required - damage to eyes, teeth, lips or other oral mucosa - nerve damage due to positioning  - sore throat or hoarseness - Damage to heart, brain, nerves, lungs, other parts of body or loss of life  Patient voiced understanding.)        Anesthesia Quick Evaluation

## 2020-08-24 ENCOUNTER — Encounter: Payer: Self-pay | Admitting: Gastroenterology

## 2020-08-24 ENCOUNTER — Telehealth: Payer: Self-pay

## 2020-08-24 MED ORDER — RIVAROXABAN 20 MG PO TABS: 20.0000 mg | ORAL_TABLET | Freq: Every day | ORAL | 5 refills | Status: DC

## 2020-08-24 NOTE — Telephone Encounter (Signed)
Thank you! I appreciate it! Loel Dubonnet, NP

## 2020-08-24 NOTE — Telephone Encounter (Signed)
-----   Message from Loel Dubonnet, NP sent at 08/23/2020  2:17 PM EST ----- Regarding: RE: Resume anticoagulation Thank you for the update! I will CC our triage team and ask them to call to be sure he resumes Xarelto. He last picked up a 90 day supply from the pharmacy 07/18/20 so it appears he has been taking it.   I have also Teller his PCP just as an [place].   Best, Loel Dubonnet, NP  ----- Message ----- From: [name], MD Sent: 08/23/2020  10:49 AM EST To: Loel Dubonnet, NP, Abigail Butts, PA-C Subject: Resume anticoagulation                         Hi  No source of anemia identified.  Ok to restart his anticoagulation  RV

## 2020-08-24 NOTE — Telephone Encounter (Signed)
Spoke with patient and reviewed recommendations to resume Xarelto 20 mg daily. He verbalized understanding with no further questions at this time.

## 2020-08-25 ENCOUNTER — Telehealth: Payer: Self-pay

## 2020-08-25 ENCOUNTER — Encounter: Payer: Self-pay | Admitting: Gastroenterology

## 2020-08-25 LAB — SURGICAL PATHOLOGY

## 2020-08-25 NOTE — Telephone Encounter (Signed)
4PM: Palliative care SW outreached patient to schedule monthly in home visit.   Visit scheduled for 12/30 @1030 . SW called and LVM for niece, Neoma Laming, to make aware of visit time, per niece [name] request.

## 2020-08-26 ENCOUNTER — Other Ambulatory Visit: Payer: Self-pay

## 2020-08-30 ENCOUNTER — Telehealth: Payer: Self-pay | Admitting: Family Medicine

## 2020-08-30 ENCOUNTER — Ambulatory Visit (INDEPENDENT_AMBULATORY_CARE_PROVIDER_SITE_OTHER): Payer: Medicare HMO | Admitting: Family Medicine

## 2020-08-30 ENCOUNTER — Other Ambulatory Visit: Payer: Self-pay

## 2020-08-30 ENCOUNTER — Other Ambulatory Visit: Payer: Self-pay | Admitting: *Deleted

## 2020-08-30 ENCOUNTER — Encounter: Payer: Self-pay | Admitting: Family Medicine

## 2020-08-30 VITALS — BP 100/60 | HR 57 | Temp 97.5°F | Ht 71.0 in | Wt 166.6 lb

## 2020-08-30 DIAGNOSIS — M25579 Pain in unspecified ankle and joints of unspecified foot: Secondary | ICD-10-CM

## 2020-08-30 DIAGNOSIS — I4821 Permanent atrial fibrillation: Secondary | ICD-10-CM

## 2020-08-30 DIAGNOSIS — I509 Heart failure, unspecified: Secondary | ICD-10-CM

## 2020-08-30 DIAGNOSIS — R195 Other fecal abnormalities: Secondary | ICD-10-CM

## 2020-08-30 LAB — BASIC METABOLIC PANEL
BUN: 36 mg/dL — ABNORMAL HIGH (ref 6–23)
CO2: 27 mEq/L (ref 19–32)
Calcium: 9.4 mg/dL (ref 8.4–10.5)
Chloride: 89 mEq/L — ABNORMAL LOW (ref 96–112)
Creatinine, Ser: 1.26 mg/dL (ref 0.40–1.50)
GFR: 55.17 mL/min — ABNORMAL LOW (ref >60.00)
Glucose, Bld: 104 mg/dL — ABNORMAL HIGH (ref 70–99)
Potassium: 4.2 mEq/L (ref 3.5–5.1)
Sodium: 123 mEq/L — ABNORMAL LOW (ref 135–145)

## 2020-08-30 NOTE — Telephone Encounter (Signed)
Please let the patient know that cardiology wanted him to resume the xarelto at this time. He should have a prescription available to pick up at the pharmacy for this. Thanks.

## 2020-08-30 NOTE — Assessment & Plan Note (Signed)
Rate controlled.  Will check with his cardiology team regarding him restarting Xarelto.

## 2020-08-30 NOTE — Assessment & Plan Note (Signed)
Appears to be euvolemic.  He will continue torsemide 20 mg once daily and spironolactone 25 mg daily.  We will check a BMP.

## 2020-08-30 NOTE — Assessment & Plan Note (Signed)
The patient underwent work-up with GI.

## 2020-08-30 NOTE — Telephone Encounter (Signed)
-----   Message from Loel Dubonnet, NP sent at 08/30/2020  3:32 PM EST ----- Hi Dr. Caryl Bis,  He got the "okay" to resume from GI (Dr. Marius Ditch). We would recommend resuming Xarelto 20mg  daily. He reported no recent falls when I last saw him. When our nurse spoke with him 08/24/20 he reported he was taking it no not sure if he stopped since that time or hadn't resumed after colonoscopy.   Best, Loel Dubonnet, NP  ----- Message ----- From: [name], MD Sent: 08/30/2020  12:50 PM EST To: Loel Dubonnet, NP  Marco Collie,   I saw Mr Halterman for follow-up today. He has xarelto on his med list, though he has not been taking this. He was previously off of this due to fall risk. Is this something you all would like him to restart? If so, I can relay that to him. Thanks.   Randall Hiss

## 2020-08-30 NOTE — Telephone Encounter (Signed)
Patient does not have a VM setup.  Maylee Bare,cma

## 2020-08-30 NOTE — Patient Instructions (Signed)
Nice to see you. You can take Tylenol 1000 mg twice daily as needed for your ankle pain. We will get labs today. I will contact you when I hear back from cardiology.

## 2020-08-30 NOTE — Assessment & Plan Note (Signed)
Chronic issue.  Likely related to arthritis.  Discussed use of Tylenol 1000 mg twice daily as needed for the pain.  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  His history of heart failure, CAD, and kidney disease limits his use of NSAIDs.

## 2020-08-30 NOTE — Progress Notes (Addendum)
  [name], MD Phone: [phone]  Jordan Perkins is a 79 y.o. male who presents today for f/u.  CHF: Taking torsemide 20 mg once daily and spironolactone 25 mg once daily.  No shortness of breath, edema, orthopnea, or PND.  A. fib: Not currently on any medications due to high fall risk.  No palpitations.  No bleeding issues.  Ankle pain: This is bilateral.  Has been bothering him for several months.  Only bothers him when he gets up and moves around.  No discomfort when he is sitting down.  No injury.  Tylenol is beneficial.  Positive FOBT: Patient underwent EGD that was negative and had a colonoscopy with 2 polyps.  No cause for positive FOBT noted.  Chronically anemic.  Social History   Tobacco Use  Smoking Status Never Smoker  Smokeless Tobacco Never Used     ROS see history of present illness  Objective  Physical Exam Vitals:   08/30/20 1234  BP: 100/60  Pulse: (!) 57  Temp: (!) 97.5 F (36.4 C)  SpO2: 99%    BP Readings from Last 3 Encounters:  08/30/20 100/60  08/23/20 133/62  08/08/20 (!) 160/68   Wt Readings from Last 3 Encounters:  08/30/20 166 lb 9.6 oz (75.6 kg)  08/23/20 165 lb (74.8 kg)  08/08/20 156 lb (70.8 kg)    Physical Exam Constitutional:      General: He is not in acute distress.    Appearance: He is not diaphoretic.  Cardiovascular:     Rate and Rhythm: Normal rate. Rhythm irregularly irregular.     Heart sounds: Normal heart sounds.  Pulmonary:     Effort: Pulmonary effort is normal.     Breath sounds: Normal breath sounds.  Musculoskeletal:        General: No edema.     Comments: Bilateral ankles with no tenderness or swelling, good range of motion  Skin:    General: Skin is warm and dry.  Neurological:     Mental Status: He is alert.      Assessment/Plan: Please see individual problem list.  Problem List Items Addressed This Visit    Ankle pain    Chronic issue.  Likely related to arthritis.  Discussed use  of Tylenol 1000 mg twice daily as needed for the pain.  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  His history of heart failure, CAD, and kidney disease limits his use of NSAIDs.      Atrial fibrillation ([place])    Rate controlled.  Will check with his cardiology team regarding him restarting Xarelto.      CHF (congestive heart failure) ([place]) - Primary    Appears to be euvolemic.  He will continue torsemide 20 mg once daily and spironolactone 25 mg daily.  We will check a BMP.      Relevant Orders   Basic Metabolic Panel (BMET)   Occult blood positive stool    The patient underwent work-up with GI.          This visit occurred during the SARS-CoV-2 public health emergency.  Safety protocols were in place, including screening questions prior to the visit, additional usage of staff PPE, and extensive cleaning of exam room while observing appropriate contact time as indicated for disinfecting solutions.    [name], MD [place]

## 2020-08-30 NOTE — Patient Outreach (Signed)
[facility]) Care Management  08/30/2020  Jordan Perkins 28-Aug-1941 [id number]   [facility] outreach to complex care patient Mr Jordan Perkins was referred to [facility] on 04/05/20 for MD referral for Referral Reason:Heart failure would benefit from outpatient case management through [facility] and then on 04/11/20 for EMMI generalred alert for not knowing who to call about changes in condition and issues with wound healing- Resolved EMMI on 04/13/20 And recently on 05/11/20 for EMMI general for 05/10/20 1001Know who to call about changes in condition? No Scheduled follow-up? No Lost interest in things? Yes  Insurance:[employer]na medicare and medicaid [place] access [facility] admissions x1ED visits x 1in the last 6 months Last admission from [facility] 04/30/20-05/05/20 hyponatremia CHF 03/23/20 to 04/06/20 anasarca, bradycardia, acute on chronic diastoliccongestive Heart Failure (CHF)     Follow up [facility] Unsuccessful outreach   Outreach attempt to the home number  No answer. No voice mail box set up to allow a voice message to be left  Noting pt also listed with an appointment with primary care provider (PCP) at 1230 today  Plan: [facility] RN CM scheduled this patient for another call attempt within 4-7 business days  Selin Eisler L. Lavina Hamman, RN, BSN, [place] Coordinator Office number [phone] Mobile number [phone]  Main [facility] number [phone] Fax number [phone]

## 2020-09-01 NOTE — Telephone Encounter (Signed)
I called and spoke with the patient and informed him that the provider and cardiology wanted him to start back taking the xarelto and the new prescription is at the pharmacy for him.  He understood.  Lumi Winslett,cma

## 2020-09-06 NOTE — Progress Notes (Signed)
Date:  09/06/2020   ID:  Jordan Perkins, DOB 1941/03/25, MRN [id number]  Patient Location:  [address] [place] [address]   Provider location:   [facility], [place] office  PCP:  Glori Luis, MD  Cardiologist:  Hubbard Robinson [facility]  Chief Complaint  Patient presents with  . Follow-up    2 month; c/o LE edema.      History of Present Illness:    Jordan Perkins is a 79 y.o. male  past medical history of CAD,  bypass surgery, 2005 at [facility] stents in 2005,  hypertension,  hyperlipidemia,  Chronic atrial fibrillation , on xarelto hospital with shortness of breath and chest pain, rapid atrial fibrillation.  Stress test 2014 showed no ischemia Echocardiogram showed ejection fraction 60-65% Carotid 07/2016: 40 to 59% stenosis on left, <39% on the right Pulmonary HTN Who presents for follow up of his CAD and atrial fibrillation  Last visit 12/2018 Seen at [facility] 03/08/2020: weight 192 pounds Echo 03/2020: pulmonary HTN, EF 60% Weight today 165 pounds  Chronic anemia, HGB 7 over summer 2021 Now up to 9.8  Swelling improving on torsemide, had swelling on lasix 20 daily CR up to 1.26  No recent hospital admissions Lives with brother  No chest pain, No SOB, chronic bronchitis In a wheel chair today  Back On xarelto 20 mg daily  Zio: Atrial Fibrillation occurred continuously (100% burden), ranging from 32-104 bpm (avg of 56 bpm). 2 Ventricular Tachycardia runs occurred, the run with the fastest interval lasting 7 beats with a max rate of 171 bpm (avg 117 bpm);  the run with the fastest interval was also the longest.  Isolated VEs were occasional (2.6%, 28107), VE Couplets were rare (<1.0%, 393), and VE Triplets were rare (<1.0%, 14). Ventricular Bigeminy and Trigeminy were present.  Used to Make banjos ,   plays the banjo  EKG personally reviewed by myself on todays visit Atrial fibrillation rate 67 bpm ST abn inferior  leads  Other past medical history reviewed Hospital admission 10/2016 Hyponatremia, weakness, elevated T.Bili (gilberts)  hospital admission in 2014, noted to have gallstones.   negative Hida scan. Now s/p Cholecystectomy 11/14  CT scan in the hospital of his abdomen and pelvis showed acute pancreatitis with increased fluid density in the peri-pancreatic fat, multiple gallstones, small hiatal hernia  Prior CV studies:   The following studies were reviewed today:  Echocardiogram showed ejection fraction 60-65%, mild to moderate TR, moderately elevated right ventricular systolic pressures  Past Medical History:  Diagnosis Date  . (HFpEF) heart failure with preserved ejection fraction (HCC)   . CHF (congestive heart failure) (HCC)   . Coronary artery disease   . Hyperlipidemia   . Hypertension   . PAH (pulmonary artery hypertension) (HCC)   . Permanent atrial fibrillation (HCC)   . Pleural effusion    Past Surgical History:  Procedure Laterality Date  . CARDIAC CATHETERIZATION    . CHOLECYSTECTOMY    . COLONOSCOPY WITH PROPOFOL N/A 08/23/2020   Procedure: COLONOSCOPY WITH PROPOFOL;  Surgeon: Toney Reil, MD;  Location: [facility] ENDOSCOPY;  Service: Gastroenterology;  Laterality: N/A;  . CORONARY ANGIOPLASTY  06/2004   s/p stent placement @ [facility]  . CORONARY ARTERY BYPASS GRAFT  04-24-2004   CABG x 3 [facility]  . ESOPHAGOGASTRODUODENOSCOPY (EGD) WITH PROPOFOL N/A 08/23/2020   Procedure: ESOPHAGOGASTRODUODENOSCOPY (EGD) WITH PROPOFOL;  Surgeon: Toney Reil, MD;  Location: [facility] ENDOSCOPY;  Service: Gastroenterology;  Laterality: N/A;  Needs to be last Jordan     No outpatient medications have been marked as taking for the 09/07/20 encounter (Appointment) with Antonieta IbaGollan, Meeyah Ovitt J, MD.     Allergies:   Sulfa antibiotics and Other   Social History   Tobacco Use  . Smoking status: Never Smoker  . Smokeless tobacco: Never Used  Vaping Use  . Vaping Use: Never used   Substance Use Topics  . Alcohol use: No  . Drug use: No     Current Outpatient Medications on File Prior to Visit  Medication Sig Dispense Refill  . atorvastatin (LIPITOR) 80 MG tablet TAKE 1 TABLET BY MOUTH EVERY DAY 90 tablet 0  . Elastic Bandages & Supports (MEDICAL COMPRESSION SOCKS) MISC 1 application by Does not apply route daily. 2 each 0  . pantoprazole (PROTONIX) 40 MG tablet Take 1 tablet (40 mg total) by mouth daily. 60 tablet 11  . rivaroxaban (XARELTO) 20 MG TABS tablet Take 1 tablet (20 mg total) by mouth daily with supper. 30 tablet 5  . spironolactone (ALDACTONE) 25 MG tablet Take 1 tablet (25 mg total) by mouth daily. 90 tablet 3  . torsemide (DEMADEX) 20 MG tablet Take 1 tablet (20 mg total) by mouth daily. 90 tablet 3   No current facility-administered medications on file prior to visit.     Family Hx: The patient's family history includes Heart attack in his brother; Heart disease in his father and paternal uncle.  ROS:   Please see the history of present illness.    Review of Systems  Constitutional: Negative.   Respiratory: Negative.   Cardiovascular: Positive for leg swelling.  Gastrointestinal: Negative.   Musculoskeletal: Negative.   Neurological: Negative.   Psychiatric/Behavioral: Negative.   All other systems reviewed and are negative.    Labs/Other Tests and Data Reviewed:    Recent Labs: 04/03/2020: Magnesium 2.1 04/30/2020: B Natriuretic Peptide 283.6 07/18/2020: ALT 17; Platelets 180 08/08/2020: Hemoglobin 10.8 08/30/2020: BUN 36; Creatinine, Ser 1.26; Potassium 4.2; Sodium 123   Recent Lipid Panel Lab Results  Component Value Date/Time   CHOL 75 03/24/2020 04:34 AM   CHOL 89 (L) 04/06/2015 04:42 PM   CHOL 150 06/06/2013 09:47 AM   TRIG 42 03/24/2020 04:34 AM   TRIG 85 06/06/2013 09:47 AM   HDL 21 (L) 03/24/2020 04:34 AM   HDL 40 04/06/2015 04:42 PM   HDL 43 06/06/2013 09:47 AM   CHOLHDL 3.6 03/24/2020 04:34 AM   LDLCALC 46  03/24/2020 04:34 AM   LDLCALC 36 04/06/2015 04:42 PM   LDLCALC 90 06/06/2013 09:47 AM    Wt Readings from Last 3 Encounters:  08/30/20 166 lb 9.6 oz (75.6 kg)  08/23/20 165 lb (74.8 kg)  08/08/20 156 lb (70.8 kg)     Exam:    BP (!) 150/52 (BP Location: Left Arm, Patient Position: Sitting, Cuff Size: Normal)   Pulse 67   Ht 5\' 11"  (1.803 m)   Wt 165 lb 4 oz (75 kg)   SpO2 98%   BMI 23.05 kg/m  Constitutional:  oriented to person, place, and time. No distress.  HENT:  Head: Grossly normal Eyes:  no discharge. No scleral icterus.  Neck: No JVD, no carotid bruits  Cardiovascular: irreg irreg , no murmurs appreciated Trace edema B/l Pulmonary/Chest: Clear to auscultation bilaterally, no wheezes or rails Abdominal: Soft.  no distension.  no tenderness.  Musculoskeletal: Normal range of motion Neurological:  normal muscle tone. Coordination normal. No atrophy Skin:  Skin warm and dry Psychiatric: normal affect, pleasant   ASSESSMENT & PLAN:    Atrial fibrillation, unspecified type (HCC) Permanent atrial fibrillation On anticoagulation, rate well controlled  Coronary artery disease of native artery of native heart with stable angina pectoris ([place]) Currently with no symptoms of angina. No further workup at this time. Continue current medication regimen.  Chronic diastolic CHF Exacerbated by atrial fibrillation 30 pound weight loss over the past 6 months Continue torsemide 20 mg daily Into next year consider repeat echocardiogram to assess right heart pressures Will need repeat BMP at that time  PAD (peripheral artery disease) ([place]) At later date in office follow-up, would recommend repeat carotid ultrasound  Bilateral carotid artery disease, unspecified type ([place]) As above, continue aggressive lipid management, periodic imaging  S/P CABG (coronary artery bypass graft) Denies anginal symptoms  Essential hypertension Blood pressure is well controlled on today's  visit. No changes made to the medications.   Total encounter time more than 25 minutes  Greater than 50% was spent in counseling and coordination of care with the patient   Signed, Ida Rogue, MD  09/06/2020 1:37 PM    [facility] [address], [place], [place] [address]

## 2020-09-07 ENCOUNTER — Other Ambulatory Visit: Payer: Self-pay

## 2020-09-07 ENCOUNTER — Ambulatory Visit (INDEPENDENT_AMBULATORY_CARE_PROVIDER_SITE_OTHER): Payer: Medicare HMO | Admitting: Cardiovascular Disease

## 2020-09-07 ENCOUNTER — Encounter: Payer: Self-pay | Admitting: Cardiovascular Disease

## 2020-09-07 VITALS — BP 150/52 | HR 67 | Ht 71.0 in | Wt 165.2 lb

## 2020-09-07 DIAGNOSIS — I739 Peripheral vascular disease, unspecified: Secondary | ICD-10-CM

## 2020-09-07 DIAGNOSIS — I4821 Permanent atrial fibrillation: Secondary | ICD-10-CM | POA: Diagnosis not present

## 2020-09-07 DIAGNOSIS — I10 Essential (primary) hypertension: Secondary | ICD-10-CM | POA: Diagnosis not present

## 2020-09-07 DIAGNOSIS — I5032 Chronic diastolic (congestive) heart failure: Secondary | ICD-10-CM | POA: Diagnosis not present

## 2020-09-07 DIAGNOSIS — I25118 Atherosclerotic heart disease of native coronary artery with other forms of angina pectoris: Secondary | ICD-10-CM

## 2020-09-07 NOTE — Patient Instructions (Addendum)
Medication Instructions:  No changes  If you need a refill on your cardiac medications before your next appointment, please call your pharmacy.    Lab work: No new labs needed   If you have labs (blood work) drawn today and your tests are completely normal, you will receive your results only by: Marland Kitchen MyChart Message (if you have MyChart) OR . A paper copy in the mail If you have any lab test that is abnormal or we need to change your treatment, we will call you to review the results.   Testing/Procedures: No new testing needed   Follow-Up: At [facility], you and your health needs are our priority.  As part of our continuing mission to provide you with exceptional heart care, we have created designated Provider Care Teams.  These Care Teams include your primary Cardiologist (physician) and Advanced Practice Providers (APPs -  Physician Assistants and Nurse Practitioners) who all work together to provide you with the care you need, when you need it.  . You will need a follow up appointment in 3 months, APP ok  . Providers on your designated Care Team:   . Murray Hodgkins, NP . Christell Faith, PA-C . Marrianne Mood, PA-C  Any Other Special Instructions Will Be Listed Below (If Applicable).  COVID-19 Vaccine Information can be found at: ShippingScam.co.uk For questions related to vaccine distribution or appointments, please email [email or web address] or call [phone].

## 2020-09-08 ENCOUNTER — Other Ambulatory Visit: Payer: Medicare HMO

## 2020-09-08 ENCOUNTER — Other Ambulatory Visit: Payer: Self-pay | Admitting: *Deleted

## 2020-09-08 VITALS — BP 140/58 | HR 71 | Temp 97.7°F | Wt 166.0 lb

## 2020-09-08 DIAGNOSIS — Z515 Encounter for palliative care: Secondary | ICD-10-CM

## 2020-09-08 NOTE — Progress Notes (Signed)
PATIENT NAME: Delbert Darley DOB: 04/03/1941 MRN: [id number]  PRIMARY CARE PROVIDER: Glori Luis, MD  RESPONSIBLE PARTY:  Acct ID - Guarantor Home Phone Work Phone Relationship Acct Type  [id number] - Pau,RICHA* [phone]  Self P/F     [address], [place], [place] [address]    PLAN OF CARE and INTERVENTIONS:               1.  GOALS OF CARE/ ADVANCE CARE PLANNING:  Remain home with assistance of his brother.               2.  PATIENT/CAREGIVER EDUCATION:  CHF               4. PERSONAL EMERGENCY PLAN:  Activate 911 for emergencies.               5.  DISEASE STATUS:  Joint visit completed with [place], SW.   Patient is found in the living room with his legs elevated.  Patient denies any nausea, vomiting, chest pain, or shortness of breath.    Patient is ambulatory with his cane.  No falls are reported.    Po intake has been good and patient reports doing most of his cooking.    Denies insomnia.  Bed is set up in the living room.  Reviewed medications with patient and he reports not taking lipitor or protonix at this time.  Patient reports weighing himself on a daily basis.  Weight typically fluctuates between 165-167 lbs.  Re-enforced CHF education.  Reviewed code status and patient confirms DNR status. Patient understands that CPR would not be performed should his heart stop.   He does not have a gold form in the home.  I have reached out to Florentina Addison, NP requesting a DNR for patient.   HISTORY OF PRESENT ILLNESS:  79 year old male with hx of cirrhosis of the liver and CHF.  Patient is being followed by Palliative Care monthly and PRN.  CODE STATUS: Desires DNR but no form in the home. ADVANCED DIRECTIVES: Yes MOST FORM: No PPS: 60%   PHYSICAL EXAM:   VITALS: Today's Vitals   09/08/20 1035  Weight: 166 lb (75.3 kg)    LUNGS: CTA CARDIAC: irregular EXTREMITIES: Trace edema SKIN: Warm and dry to touch. NEURO: A & O x 3       Truitt Merle, RN

## 2020-09-08 NOTE — Patient Outreach (Signed)
[name]) Care Management  09/08/2020  Jordan Perkins 1940/11/07 [id number]   [facility] outreachto complex care patient Jordan Perkins was referred to [facility] on 04/05/20 for MD referral for Referral Reason:Heart failure would benefit from outpatient case management through [facility] and then on 04/11/20 for EMMI generalred alert for not knowing who to call about changes in condition and issues with wound healing- Resolved EMMI on 04/13/20 And recently on 05/11/20 for EMMI general for 05/10/20 1001Know who to call about changes in condition? No Scheduled follow-up? No Lost interest in things? Yes  Insurance:Aetna medicare and medicaid [place] access [facility] admissions x1ED visits x 1in the last 6 months Last admission [facility] 04/30/20-05/05/20 hyponatremia CHF 03/23/20 to 04/06/20 anasarca, bradycardia, acute on chronic diastoliccongestive Heart Failure (CHF)  Patient is able to verify HIPAA ([place]) identifiers Reviewed and addressed the purpose of the follow up call with the patient  Consent: [facility] RN CM reviewed [facility] services with patient. Patient gave verbal consent for services.    Follow up Denies swelling or Atrial fibrillation symptoms 166 lbs weight this morning,  165 lbs on 09/07/20  Loss 30 lbs in 6 months intentionally Reports he does not use salt any more but then reported he uses [employer] him to ask dr Rockey Situ about nu salt  He reports he asked palliative care staff about nu salt  He reports he has had hypertension medications discontinued He states he still has not received new compression hose but will have a podiatry appointment on 09/15/20  He has not scheduled his transportation for this 09/15/20 visit He was encouraged to seek assistance from his niece who agreed to assist him with calling in his appointments He reports not missing medical appointments  Niece  still assists with cleaning his home during the weekends when not working  Continues to be followed by palliative care services had visit on 09/08/20  Plan: [facility] RN CM scheduled this patient for another call attempt within 30-45 business days Pt encouraged to return a call to [place] CM prn- again provided [facility] RN CM office number  Routed note to MD Goals Addressed              This Visit's Progress     Patient Stated     [facility]) Eat Healthy (pt-stated)   On track     Follow Up Date 10/10/20   - change to whole grain breads, cereal, pasta - drink 6 to 8 glasses of water each day - fill half of plate with vegetables - limit fast food meals to no more than 1 per week - prepare main meal at home 3 to 5 days each week - set a realistic goal      Notes: 09/08/20 Improving Needs to discuss Nu salt with MD 06/13/20 eating better, watching sodium reports having a vegetable plate today       [facility]) Follow My Treatment Plan (pt-stated)   On track     Follow Up Date 10/10/20   - keep follow-up appointments - keep taking my medicines, even when I feel good      Notes: 09/08/20 still indicating not making transportation appointments as needed      COMPLETED: [facility]) Learn More About My Health (pt-stated)   On track     Follow Up Date 09/08/20   - ask questions - repeat what I heard to make sure I understand - bring a list  of my medicines to the visit      Notes: 09/08/20 met goal more active in his care       [facility]) Make and Keep All Appointments (pt-stated)   On track     Follow Up Date 10/10/20   - arrange a ride through an agency 1 week before appointment - ask family or friend for a ride - call to cancel if needed - keep a calendar with appointment dates      Notes: 09/08/20 still voicing concerns with making transportation appointments      COMPLETED: [facility]) Track and Manage Fluids and Swelling (pt-stated)   On track     Follow Up Date 09/08/20   - call office if I  gain more than 2 pounds in one day or 5 pounds in one week - keep legs up while sitting - use salt in moderation - watch for swelling in feet, ankles and legs every day - weigh myself daily     Notes: 09/08/20 met goal  Denies swelling or Atrial fibrillation symptoms 166 lbs weight this morning,  165 lbs on 09/07/20  Loss 30 lbs in 6 months intentionally Reports he does not use salt any more 07/28/20 had paracentesis on 07/27/20 continues to weigh but with minimal weight increases       Lyndal Alamillo L. Lavina Hamman, RN, BSN, [place] Coordinator Office number [phone] Mobile number [phone]  Main [facility] number [phone] Fax number [phone]

## 2020-09-08 NOTE — Progress Notes (Signed)
COMMUNITY PALLIATIVE CARE SW NOTE  PATIENT NAME: Jordan Perkins DOB: 10-18-1940 MRN: [id number]  PRIMARY CARE PROVIDER: [name], MD  RESPONSIBLE PARTY:  Acct ID - Guarantor Home Phone Work Phone Relationship Acct Type  [id number] - [phone]  Self P/F     [place], [place], [place] [address]     PLAN OF CARE and INTERVENTIONS:             1. GOALS OF CARE/ ADVANCE CARE PLANNING:  Patient is a DNR, form not in the home, will follow up with NP. Patient's goal is to remain at home with brother. 2.         SOCIAL/EMOTIONAL/SPIRITUAL ASSESSMENT/ INTERVENTIONS:  SW and RN Almyra Free met with patient in patients home for follow up visit. Patient lives in a single story mobile home. Patient updated SW an Therapist, sports on medical condition and hx.  Patient recently had a hospital visit due to welling and fluid build up patient had a colonoscopy and endoscopy procedure done. On 12/14 and pleural effusion to have fluid drained. Patient reports no pain other than minor pains while walking and patient takes Tylenol, patient also keeps his feet elevated. Patient shares that he sleeps well and naps during the day. No loss in appetite. No falls reported.  RN reviewed medications and took vitals. Patients current weight is 166lbs. Patient encouraged to weigh himself daily.  SW reviewed palliative care services. SW discussed goals, reviewed care plan, provided emotional support, used active and reflective listening. Palliative care will continue to monitor and assist with long term care planning as needed.  3.         PATIENT/CAREGIVER EDUCATION/ COPING:  Patient A&O x3, Pt able to answer all questions appropriately. Patient has normal motor skills. Patient has good reasoning ability. No S/S of depression or anxiety. Patients family is supportive. Brother resides with him and niece lives close by and checks on him often. 4.         PERSONAL EMERGENCY PLAN:  Patient will call 9-1-1 for emergencies.   5.         COMMUNITY RESOURCES COORDINATION/ HEALTH CARE NAVIGATION:  Patient manages care. 6.         FINANCIAL/LEGAL CONCERNS/INTERVENTIONS:  None.     SOCIAL HX:  Social History   Tobacco Use  . Smoking status: Never Smoker  . Smokeless tobacco: Never Used  Substance Use Topics  . Alcohol use: No    CODE STATUS: DNR. Form to be mailed. ADVANCED DIRECTIVES: Y MOST FORM COMPLETE:  N HOSPICE EDUCATION PROVIDED: N  PPS: Patient is ambulatory with SPC, has a RW. Patient is I with all ADL's and cooks meals.   Time spent: 60min       [place], [place]

## 2020-09-15 ENCOUNTER — Encounter: Payer: Self-pay | Admitting: Podiatry

## 2020-09-15 ENCOUNTER — Ambulatory Visit (INDEPENDENT_AMBULATORY_CARE_PROVIDER_SITE_OTHER): Payer: Medicare HMO | Admitting: Podiatry

## 2020-09-15 ENCOUNTER — Other Ambulatory Visit: Payer: Self-pay

## 2020-09-15 DIAGNOSIS — M79675 Pain in left toe(s): Secondary | ICD-10-CM | POA: Diagnosis not present

## 2020-09-15 DIAGNOSIS — B351 Tinea unguium: Secondary | ICD-10-CM | POA: Diagnosis not present

## 2020-09-15 DIAGNOSIS — I739 Peripheral vascular disease, unspecified: Secondary | ICD-10-CM | POA: Diagnosis not present

## 2020-09-15 DIAGNOSIS — N183 Chronic kidney disease, stage 3 unspecified: Secondary | ICD-10-CM | POA: Diagnosis not present

## 2020-09-15 DIAGNOSIS — N1831 Chronic kidney disease, stage 3a: Secondary | ICD-10-CM | POA: Diagnosis not present

## 2020-09-15 DIAGNOSIS — M79674 Pain in right toe(s): Secondary | ICD-10-CM

## 2020-09-15 NOTE — Progress Notes (Signed)
This patient returns to my office for at risk foot care.  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  This patient is unable to cut nails himself since the patient cannot reach his nails.These nails are painful walking and wearing shoes.  This patient presents for at risk foot care today.  General Appearance  Alert, conversant and in no acute stress.  Vascular  Dorsalis pedis and posterior tibial  pulses are absent  bilaterally.  Capillary return is within normal limits  bilaterally. Cold feet.  bilaterally.  Neurologic  Senn-Weinstein monofilament wire test within normal limits  bilaterally. Muscle power within normal limits bilaterally.  Nails Thick disfigured discolored nails with subungual debris  from hallux to fifth toes bilaterally. No evidence of bacterial infection or drainage bilaterally.  Orthopedic  No limitations of motion  feet .  No crepitus or effusions noted.  No bony pathology or digital deformities noted.  Ankle arthritis.  Skin  normotropic skin with no porokeratosis noted bilaterally.  No signs of infections or ulcers noted.     Onychomycosis  Pain in right toes  Pain in left toes  Consent was obtained for treatment procedures.   Mechanical debridement of nails 1-5  bilaterally performed with a nail nipper.  Filed with dremel without incident.    Return office visit  3 months                    Told patient to return for periodic foot care and evaluation due to potential at risk complications.   Helane Gunther DPM

## 2020-09-19 ENCOUNTER — Other Ambulatory Visit: Payer: Self-pay

## 2020-09-19 ENCOUNTER — Other Ambulatory Visit: Payer: Medicare HMO

## 2020-09-19 VITALS — BP 118/52 | HR 58 | Temp 100.5°F | Resp 20 | Wt 163.0 lb

## 2020-09-19 DIAGNOSIS — Z515 Encounter for palliative care: Secondary | ICD-10-CM

## 2020-09-19 NOTE — Progress Notes (Signed)
PATIENT NAME: Jordan Perkins DOB: Aug 31, 1941 MRN: [id number]  PRIMARY CARE PROVIDER: [name], MD  RESPONSIBLE PARTY:  Acct ID - Guarantor Home Phone Work Phone Relationship Acct Type  [id number] - [id number]  Self P/F     [place], [address], [place] [address]    PLAN OF CARE and INTERVENTIONS:               1.  GOALS OF CARE/ ADVANCE CARE PLANNING:  Remain home with assistance of brother and nieces.               2.  PATIENT/CAREGIVER EDUCATION:  CHF               4. PERSONAL EMERGENCY PLAN:  Activate 911 for emergencies.               5.  DISEASE STATUS:  Patient found ambulating to his chair. We discussed keeping his walker with him for safety.  Patient verbalized understanding.   Meals on Wheels just delivered a meal prior to my visit.    Reviewed medications and any changes since my last visit.  Patient reports seeing cardiology last week and getting a good report.  Patient denies any shortness of breath, nausea or vomiting.  Occasional issues are noted with arthritic pain to his bilateral feet.  Patient is taking a prn tylenol every day to every other day.  Patient denies insomnia and is napping some during the day.  He continues to cook breakfast for himself.  Patient will often eat meals on wheels for dinner. Family is bringing meals to patient on the weekends.   Patient is weighing himself daily.  His weight has been consistant at 163 lbs for the last 3 days.  He is aware to contact his PCP for weight gain of more than 5 lbs in 1 week.  Education provided on s/s of CHF.  Patient denies any changes or concerns at this time.  Visit is scheduled for next month, 2/3 at 9 am.    HISTORY OF PRESENT ILLNESS:  80 year old male with a hx of CHF.  Patient is being followed by Palliative Care monthly and prn.  CODE STATUS: DNR ADVANCED DIRECTIVES: Yes MOST FORM: No PPS: 50%   PHYSICAL EXAM:   VITALS: Temp 100.5 F, BP 118/52, P 58 R 20 O2 Saturation  97% on room air. LUNGS: CTA, CARDIAC: HRR EXTREMITIES: No edema SKIN: Warm and dry to touch. NEURO: alert and oriented x 3       Jordan Burton, RN

## 2020-09-20 ENCOUNTER — Telehealth: Payer: Self-pay

## 2020-09-20 DIAGNOSIS — I509 Heart failure, unspecified: Secondary | ICD-10-CM

## 2020-09-20 NOTE — Telephone Encounter (Signed)
-----   Message from [name], MD sent at 08/30/2020  3:58 PM EST ----- Please let the patient know his sodium is low at 123 again.  Please see if he is having any issues with confusion and balance, nausea, vomiting, or other symptoms.  If he is having symptoms he will need to be evaluated in the emergency department. He will need to stop his spironolactone and have his labs rechecked on Thursday if possible, though could have them done on Friday at the hospital if needed.

## 2020-09-23 ENCOUNTER — Other Ambulatory Visit: Payer: Medicare HMO

## 2020-09-28 ENCOUNTER — Other Ambulatory Visit: Payer: Self-pay | Admitting: Cardiovascular Disease

## 2020-09-28 ENCOUNTER — Ambulatory Visit: Payer: Medicare HMO | Admitting: Family

## 2020-10-10 ENCOUNTER — Other Ambulatory Visit: Payer: Self-pay

## 2020-10-10 ENCOUNTER — Encounter: Payer: Self-pay | Admitting: *Deleted

## 2020-10-10 ENCOUNTER — Other Ambulatory Visit: Payer: Self-pay | Admitting: *Deleted

## 2020-10-10 NOTE — Patient Outreach (Signed)
[facility]) Care Management  10/10/2020  Jordan Perkins Jan 06, 1941 [id number]   [facility] outreachto complex care patient Mr Jordan Perkins was referred to [facility] on 04/05/20 for MD referral for Referral Reason:Heart failure would benefit from outpatient case management through [facility] and then on 04/11/20 for EMMI generalred alert for not knowing who to call about changes in condition and issues with wound healing- Resolved EMMI on 04/13/20 And recently on 05/11/20 for EMMI general for 05/10/20 1001Know who to call about changes in condition? No Scheduled follow-up? No Lost interest in things? Yes  Insurance:[employer] medicare and medicaid Micro access [facility] admissions x1ED visits x 1in the last 6 months Last admission from [facility] 04/30/20-05/05/20 hyponatremia CHF 03/23/20 to 04/06/20 anasarca, bradycardia, acute on chronic diastoliccongestive Heart Failure (CHF)  Patient is able to verify HIPAA ([place]) identifiers Reviewed and addressed the purpose of the follow up call with the patient  Consent: [facility]) RN CM reviewed [place] with patient. Patient gave verbal consent for services.  Appointment  Aware of 10/11/20 appointments but unsure of transportation to as he states [place] has not been cleared but he has his landlord that will be able to assist if not he will re schedule his own appointments He was able to inform [facility] RN CM that he continues to keep all after summary visit (discharge sheets) in a box so he and his niece can review them He also reports that he is able to reschedule his 10/11/20 appointment if he is not able to find transportation to come get him using his un-cleared road  Edema resolved 163-4 lbs reports his weight only varies 1-2 pounds each day and he weighs daily He denies any further medical or care coordination need at this time  last eye & hearing exams  reported done at [facility] in 2005-2006 Reports 20/20/vision, education on importance of qyr -q other year exams   hx of Atrial Fibrillation and CHF. Patient is being followed by Palliative Care monthly and PRN. CODE STATUS: DNR ADVANCED DIRECTIVES: Yes MOST FORM: No PPS: 60%  Agree to [facility] DM, health coach services after services discussed  Patient Active Problem List   Diagnosis Date Noted  . History of cirrhosis of liver   . Ankle pain 07/01/2020  . Hyposmolality and/or hyponatremia 05/13/2020  . Chronic kidney disease 05/11/2020  . Protein-calorie malnutrition, severe 05/04/2020  . Goals of care, counseling/discussion   . Palliative care by specialist   . DNR (do not resuscitate)   . Occult blood positive stool 04/15/2020  . Chronic diastolic CHF (congestive heart failure) ([place]) 03/23/2020  . Coronary artery disease   . Fall   . Hypokalemia   . Bradycardia   . Stage 3a chronic kidney disease ([place]) 07/12/2019  . Prediabetes 06/12/2019  . Macrocytic anemia 03/10/2019  . Pulmonary hypertension, unspecified ([place])   . Pleural effusion   . CHF (congestive heart failure) ([place]) 02/16/2019  . PAD (peripheral artery disease) ([place]) 01/02/2019  . Bilateral carotid artery disease ([place]) 01/02/2019  . Memory difficulty 11/05/2016  . Seborrheic keratoses 11/05/2016  . Hyponatremia 10/29/2016  . Encounter for anticoagulation discussion and counseling 07/21/2014  . CAD (coronary artery disease) 06/23/2013  . 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  . Atrial fibrillation ([place]) 06/23/2013  . Hyperlipidemia 06/23/2013  . Essential hypertension 06/23/2013  . Gallstone pancreatitis 06/23/2013     Plan Transfer pt to [facility] DM program  Goals Addressed  This Visit's Progress     Patient Stated   .  COMPLETED: ([facility]) Eat Healthy (pt-stated)   On track     Follow Up Date 10/10/20   - change to whole grain breads, cereal, pasta - drink 6 to 8 glasses of water  each day - fill half of plate with vegetables - limit fast food meals to no more than 1 per week - prepare main meal at home 3 to 5 days each week - set a realistic goal      Notes: 10/10/20 Met goal eating more at home, watching sodium and fluid intake, edema resolved, 09/08/20 Improving Needs to discuss Nu salt with MD 06/13/20 eating better, watching sodium reports having a vegetable plate today     .  [facility]) Follow My Treatment Plan (pt-stated)   On track     Follow Up Date pending outreach from DM staff   - keep follow-up appointments - keep taking my medicines, even when I feel good      Notes: 10/10/20 following treatment plan wt managed, swelling reported resolved, cooking at home, going to appointments as weather permits, taking meds as ordered 09/08/20 still indicating not making transportation appointments as needed    .  [facility]) Make and Keep All Appointments (pt-stated)   On track     Follow Up Date  pending outreach from DM staff   - arrange a ride through an agency 1 week before appointment - ask family or friend for a ride - call to cancel if needed - keep a calendar with appointment dates    Notes: 10/10/20 has not missed an appointment other than for inclement weather since last outreach Pending 10/11/20 appointment but has a plan to get transportation if roads clear or reschedule prn  09/08/20 still voicing concerns with making transportation appointments        [place]. Lavina Hamman, RN, BSN, [place] Coordinator Office number [phone] [facility] number [phone] Fax number [phone]

## 2020-10-11 ENCOUNTER — Ambulatory Visit: Payer: Medicare HMO | Admitting: Gastroenterology

## 2020-10-11 ENCOUNTER — Encounter: Payer: Self-pay | Admitting: *Deleted

## 2020-10-11 ENCOUNTER — Ambulatory Visit: Payer: Medicare HMO | Admitting: Family

## 2020-10-11 ENCOUNTER — Telehealth: Payer: Self-pay | Admitting: Family

## 2020-10-11 NOTE — Telephone Encounter (Signed)
Patient did not show for his [facility] Failure [facility] appointment on 10/11/20. Will attempt to reschedule.

## 2020-10-13 ENCOUNTER — Other Ambulatory Visit: Payer: Medicare HMO

## 2020-10-13 ENCOUNTER — Other Ambulatory Visit: Payer: Self-pay

## 2020-10-13 ENCOUNTER — Encounter: Payer: Self-pay | Admitting: *Deleted

## 2020-10-13 VITALS — BP 120/50 | HR 58 | Temp 100.8°F | Resp 20 | Wt 164.0 lb

## 2020-10-13 DIAGNOSIS — Z515 Encounter for palliative care: Secondary | ICD-10-CM

## 2020-10-13 NOTE — Progress Notes (Signed)
COMMUNITY PALLIATIVE CARE SW NOTE  PATIENT NAME: Jordan Perkins DOB: 20-Aug-1941 MRN: [id number]  PRIMARY CARE PROVIDER: [name], MD  RESPONSIBLE PARTY:  Acct ID - Guarantor Home Phone Work Phone Relationship Acct Type  [id number] - [phone]  Self P/F     [place], [place], [place] [address]     PLAN OF CARE and INTERVENTIONS:             1. GOALS OF CARE/ ADVANCE CARE PLANNING:  Patient is a DNR, form not in the home, will follow up with NP. Patient's goal is to remain at home with brother. 2.         SOCIAL/EMOTIONAL/SPIRITUAL ASSESSMENT/ INTERVENTIONS:  SW and RN Almyra Free met with patient in patients home for follow up visit. Patient lives in a single story mobile home with his brother. Patient updated SW an Therapist, sports on medical condition and hx.  Patient missed cardio appt on 2/1, rescheduled for 3/29. Patient reports no pain other than minor pains while walking and patient takes Tylenol, patient also keeps his feet elevated. Patient shares that he sleeps well and naps during the day. No loss in appetite, patient preps and cooks his own meals. No falls reported.  RN reviewed medications and took vitals. Patient encouraged to weigh himself daily.  SW discussed goals, reviewed care plan, provided emotional support, used active and reflective listening. Palliative care will continue to monitor and assist with long term care planning as needed.  3.         PATIENT/CAREGIVER EDUCATION/ COPING:  Patient A&O x3, Pt able to answer all questions appropriately. Patient has normal motor skills. Patient has good reasoning ability. No S/S of depression or anxiety. Patients family is supportive. Brother resides with him and niece lives close by and checks on him often. 4.         PERSONAL EMERGENCY PLAN:  Patient will call 9-1-1 for emergencies.  5.         COMMUNITY RESOURCES COORDINATION/ HEALTH CARE NAVIGATION:  Patient manages care. 6.         FINANCIAL/LEGAL  CONCERNS/INTERVENTIONS:  None.     SOCIAL HX:  Social History   Tobacco Use  . Smoking status: Never Smoker  . Smokeless tobacco: Never Used  Substance Use Topics  . Alcohol use: No    CODE STATUS: DNR  ADVANCED DIRECTIVES: Y MOST FORM COMPLETE:  N HOSPICE EDUCATION PROVIDED: N  PPS: Patient is independent with ADL's and able to make meals. Patient is continent of bowel and bladder.    Time spent: 93mn      [place]

## 2020-10-13 NOTE — Progress Notes (Signed)
PATIENT NAME: Jordan Perkins DOB: October 04, 1940 MRN: [id number]  PRIMARY CARE PROVIDER: [name], MD  RESPONSIBLE PARTY:  Acct ID - Guarantor Home Phone Work Phone Relationship Acct Type  [id number] - [phone]  Self P/F     [place], [address], [place] [address]    PLAN OF CARE and INTERVENTIONS:               1.  GOALS OF CARE/ ADVANCE CARE PLANNING:  Remain home with the assistance of family.               2.  PATIENT/CAREGIVER EDUCATION:  CHF, sodium intake, keeping scheduled appointments.               4. PERSONAL EMERGENCY PLAN:  911 for emergencies.               5.  DISEASE STATUS:  Patient found in his chair watching television.  He states he has not been to any appointments since my last visit and denies any changes with medication.  Patient is cooking breakfast and lunch.  His family is bringing meals on the weekends.  Patient states he will occasionally eat TV dinners.  Education is provided on high sodium in TV dinners and CHF.  Patient voiced understanding of need to decrease sodium intake.   Patient continues to weigh daily.  His weights are maintaining between 163-165.  Patient states his weight will fluctuate by 1 lb on occasion.   Patient denies any shortness of breath or chest pain at present.  Patient notes that he will rest periodically when he is up cooking.  Patient states he fatigues easily when completing household chores.    Patient denies issues with nausea/vomitting.  He is having regular bowel movements but notes some occasional issues with constipation.  This is resolved with the use of prn stool softners.  No issues with urination.  HISTORY OF PRESENT ILLNESS:  80 year old male with hx of Atrial Fibrillation and CHF. Patient is being followed by Palliative Care monthly and PRN.  CODE STATUS: DNR ADVANCED DIRECTIVES: Yes MOST FORM: No PPS: 60%   PHYSICAL EXAM:   VITALS: Today's Vitals   10/13/20 0909  BP: (!) 120/50  Pulse:  (!) 58  Resp: 20  Temp: (!) 100.8 F (38.2 C)  SpO2: 99%  Weight: 164 lb (74.4 kg)  PainSc: 2   PainLoc: Foot    LUNGS: CTA CARDIAC: HRR EXTREMITIES: 2+ bilateral ankle edema SKIN:   Dryness present.  No open areas are reported by patient. NEURO: Alert and oriented x3       Lorenza Burton, RN

## 2020-10-14 ENCOUNTER — Other Ambulatory Visit: Payer: Medicare HMO

## 2020-10-17 ENCOUNTER — Other Ambulatory Visit: Payer: Self-pay | Admitting: *Deleted

## 2020-10-17 ENCOUNTER — Ambulatory Visit: Payer: Medicare HMO | Admitting: *Deleted

## 2020-10-27 ENCOUNTER — Encounter: Payer: Self-pay | Admitting: Family

## 2020-10-27 ENCOUNTER — Ambulatory Visit: Payer: Medicare HMO | Attending: Family | Admitting: Family

## 2020-10-27 ENCOUNTER — Other Ambulatory Visit: Payer: Self-pay

## 2020-10-27 VITALS — BP 150/62 | HR 44 | Resp 16 | Ht 71.0 in | Wt 169.1 lb

## 2020-10-27 DIAGNOSIS — Z79899 Other long term (current) drug therapy: Secondary | ICD-10-CM | POA: Diagnosis not present

## 2020-10-27 DIAGNOSIS — I10 Essential (primary) hypertension: Secondary | ICD-10-CM

## 2020-10-27 DIAGNOSIS — Z8249 Family history of ischemic heart disease and other diseases of the circulatory system: Secondary | ICD-10-CM | POA: Insufficient documentation

## 2020-10-27 DIAGNOSIS — I4821 Permanent atrial fibrillation: Secondary | ICD-10-CM | POA: Diagnosis not present

## 2020-10-27 DIAGNOSIS — Z951 Presence of aortocoronary bypass graft: Secondary | ICD-10-CM | POA: Diagnosis not present

## 2020-10-27 DIAGNOSIS — Z882 Allergy status to sulfonamides status: Secondary | ICD-10-CM | POA: Diagnosis not present

## 2020-10-27 DIAGNOSIS — Z7901 Long term (current) use of anticoagulants: Secondary | ICD-10-CM | POA: Diagnosis not present

## 2020-10-27 DIAGNOSIS — I110 Hypertensive heart disease with heart failure: Secondary | ICD-10-CM | POA: Diagnosis not present

## 2020-10-27 DIAGNOSIS — E785 Hyperlipidemia, unspecified: Secondary | ICD-10-CM | POA: Diagnosis not present

## 2020-10-27 DIAGNOSIS — I5032 Chronic diastolic (congestive) heart failure: Secondary | ICD-10-CM | POA: Diagnosis not present

## 2020-10-27 DIAGNOSIS — E871 Hypo-osmolality and hyponatremia: Secondary | ICD-10-CM | POA: Insufficient documentation

## 2020-10-27 LAB — BASIC METABOLIC PANEL
Anion gap: 8 (ref 5–15)
BUN: 41 mg/dL — ABNORMAL HIGH (ref 8–23)
CO2: 23 mmol/L (ref 22–32)
Calcium: 9.2 mg/dL (ref 8.9–10.3)
Chloride: 93 mmol/L — ABNORMAL LOW (ref 98–111)
Creatinine, Ser: 1.25 mg/dL — ABNORMAL HIGH (ref 0.61–1.24)
GFR, Estimated: 59 mL/min — ABNORMAL LOW (ref >60)
Glucose, Bld: 110 mg/dL — ABNORMAL HIGH (ref 70–99)
Potassium: 3.9 mmol/L (ref 3.5–5.1)
Sodium: 124 mmol/L — ABNORMAL LOW (ref 135–145)

## 2020-10-27 NOTE — Patient Instructions (Addendum)
Continue weighing daily and call for an overnight weight gain of > 2 pounds or a weekly weight gain of >5 pounds.   Call us in the future if you'd like to schedule another appointment 

## 2020-10-27 NOTE — Progress Notes (Signed)
Patient ID: Jordan Perkins, male    DOB: [birth year], 80 y.o.   MRN: [id number]  Jordan Perkins is a 80 y/o male with a history of hyperlipidemia, HTN, PAH, atrial fibrillation and chronic heart failure.   Echo report from 03/23/20 reviewed and showed an EF of 60-65% along with severely elevated PA pressure, mild LAE and mild Jordan.   Admitted 04/30/20 due to weakness and lower extremity edema. Found to have low sodium at 119. Nephrology and palliative care consults obtained. Treated w/ 3% saline and then tolvaptan x 2 with improvement of sodium. PT/OT evaluations done. Discharged after 5 days.   He presents today for his follow up visit with a chief complaint of minimal fatigue upon moderate exertion. He describes this as chronic in nature although has difficulty in stating how long he's felt like this. He has associated numbness in feet, pedal edema and gradual weight gain along with this. He denies any difficulty sleeping, abdominal distention, palpitations, chest pain, dizziness, shortness of breath or cough.   Has not had his lab work rechecked for his low sodium level. Upon review of medication bottles, he's taking spironolactone, furosemide and torsemide.    Past Medical History:  Diagnosis Date  . (HFpEF) heart failure with preserved ejection fraction ([place])   . CHF (congestive heart failure) ([place])   . Coronary artery disease   . Hyperlipidemia   . Hypertension   . PAH (pulmonary artery hypertension) ([place])   . Permanent atrial fibrillation ([place])   . Pleural effusion    Past Surgical History:  Procedure Laterality Date  . CARDIAC CATHETERIZATION    . CHOLECYSTECTOMY    . COLONOSCOPY WITH PROPOFOL N/A 08/23/2020   Procedure: COLONOSCOPY WITH PROPOFOL;  Surgeon: [name], MD;  Location: [facility] ENDOSCOPY;  Service: Gastroenterology;  Laterality: N/A;  . CORONARY ANGIOPLASTY  06/2004   s/p stent placement @ [facility]  . CORONARY ARTERY BYPASS GRAFT  04-24-2004   CABG x 3 [facility]  .  ESOPHAGOGASTRODUODENOSCOPY (EGD) WITH PROPOFOL N/A 08/23/2020   Procedure: ESOPHAGOGASTRODUODENOSCOPY (EGD) WITH PROPOFOL;  Surgeon: [name], MD;  Location: [facility];  Service: Gastroenterology;  Laterality: N/A;  Needs to be last case   Family History  Problem Relation Age of Onset  . Heart disease Father   . Heart disease Paternal Uncle   . Heart attack Brother    Social History   Tobacco Use  . Smoking status: Never Smoker  . Smokeless tobacco: Never Used  Substance Use Topics  . Alcohol use: No   Allergies  Allergen Reactions  . Sulfa Antibiotics Rash    Rash/hives   . Other Rash   Prior to Admission medications   Medication Sig Start Date End Date Taking? Authorizing Provider  atorvastatin (LIPITOR) 80 MG tablet TAKE 1 TABLET BY MOUTH EVERY DAY 09/28/20  Yes Gollan, Kathlene November, MD  Elastic Bandages & Supports (MEDICAL COMPRESSION SOCKS) MISC 1 application by Does not apply route daily. 06/27/20  Yes [name], MD  ferrous sulfate 325 (65 FE) MG tablet Take 325 mg by mouth daily with breakfast.   Yes [provider]  furosemide (LASIX) 20 MG tablet Take 20 mg by mouth.   Yes [provider]  pantoprazole (PROTONIX) 40 MG tablet Take 1 tablet (40 mg total) by mouth daily. 07/08/20  Yes Loel Dubonnet, NP  rivaroxaban (XARELTO) 20 MG TABS tablet Take 1 tablet (20 mg total) by mouth daily with supper. 08/24/20  Yes Laurann Montana  S, NP  spironolactone (ALDACTONE) 25 MG tablet Take 1 tablet (25 mg total) by mouth daily. 07/04/20  Yes [name], MD  torsemide (DEMADEX) 20 MG tablet Take 1 tablet (20 mg total) by mouth daily. 07/25/20  Yes Loel Dubonnet, NP    Review of Systems  Constitutional: Positive for fatigue. Negative for appetite change.  HENT: Negative for congestion and sore throat.   Eyes: Negative.   Respiratory: Negative for cough and chest tightness.   Cardiovascular: Positive for leg swelling (during the  day; resolves overnight). Negative for chest pain and palpitations.  Gastrointestinal: Negative for abdominal distention and abdominal pain.  Endocrine: Negative.   Genitourinary: Negative.   Musculoskeletal: Positive for arthralgias (both feet at times). Negative for back pain and neck pain.  Skin: Negative.   Allergic/Immunologic: Negative.   Neurological: Positive for numbness (in feet). Negative for dizziness, light-headedness and headaches.  Hematological: Negative for adenopathy. Does not bruise/bleed easily.  Psychiatric/Behavioral: Negative for dysphoric mood and sleep disturbance (sleeping on 2 pillows). The patient is not nervous/anxious.    Vitals:   10/27/20 1004  BP: (!) 150/62  Pulse: (!) 44  Resp: 16  SpO2: 100%  Weight: 169 lb 2 oz (76.7 kg)  Height: 5\' 11"  (1.803 m)   Wt Readings from Last 3 Encounters:  10/27/20 169 lb 2 oz (76.7 kg)  10/13/20 164 lb (74.4 kg)  10/10/20 164 lb (74.4 kg)   Lab Results  Component Value Date   CREATININE 1.26 08/30/2020   CREATININE 1.08 07/27/2020   CREATININE 1.22 07/18/2020    Physical Exam Vitals and nursing note reviewed.  Constitutional:      Appearance: Normal appearance.  HENT:     Head: Normocephalic and atraumatic.  Cardiovascular:     Rate and Rhythm: Regular rhythm. Bradycardia present.  Pulmonary:     Effort: Pulmonary effort is normal. No respiratory distress.     Breath sounds: No wheezing or rales.  Abdominal:     General: There is no distension.     Palpations: Abdomen is soft. There is no splenomegaly.     Tenderness: There is no abdominal tenderness.  Musculoskeletal:        General: No tenderness.     Cervical back: Normal range of motion and neck supple.     Right lower leg: Edema (Trace pitting) present.     Left lower leg: Edema (Trace pitting) present.  Skin:    General: Skin is warm and dry.  Neurological:     Mental Status: He is alert and oriented to person, place, and time. Mental  status is at baseline.  Psychiatric:        Mood and Affect: Mood normal.        Behavior: Behavior normal.    Assessment & Plan:  1: Chronic heart failure with preserved ejection fraction with structural changes (LAE)- - NYHA class II - euvolemic today - says that he weighs daily; reminded to call for any overnight weight gain of > 2 pounds or a weekly weight gain of > 5 pounds - weight up 8 pounds from last visit here 3 months ago - not adding salt - saw cardiology Rockey Situ) 09/07/20; returns 12/06/20 - instructed to stop furosemide and a big black X was placed on label; patient verbalized understanding that he shouldn't take it anymore - will re-evaluate after getting BMP results back - saw palliative care 10/13/20 - BNP 04/30/20 was 283.6 - received both moderna covid vaccines  2:  HTN- - BP mildly elevated today - saw PCP Caryl Bis) 08/30/20; returns 11/29/20  3: Hyponatremia- - BMP 08/30/20 reviewed and showed sodium 123, potassium 4.2, creatinine 1.26 and GFR 55.17 - rechecking BMP today   Medication bottles reviewed.   Due to transportation difficulties, will not schedule another appointment at this time. Emphasized that he needed to maintain contact with PCP as well as cardiologist but that he could return anytime in the future.

## 2020-11-10 ENCOUNTER — Other Ambulatory Visit: Payer: Medicare HMO

## 2020-11-10 ENCOUNTER — Telehealth: Payer: Self-pay

## 2020-11-10 ENCOUNTER — Other Ambulatory Visit: Payer: Self-pay

## 2020-11-10 VITALS — BP 142/58 | HR 56 | Temp 98.1°F | Resp 20 | Wt 170.0 lb

## 2020-11-10 DIAGNOSIS — Z515 Encounter for palliative care: Secondary | ICD-10-CM

## 2020-11-10 NOTE — Progress Notes (Signed)
PATIENT NAME: Jordan Perkins DOB: 04/22/1941 MRN: [id number]  PRIMARY CARE PROVIDER: Sonnenberg, Eric G, MD  RESPONSIBLE PARTY:  Acct ID - Guarantor Home Phone Work Phone Relationship Acct Type  [id number] - Cutshaw,RICHA* [phone]  Self P/F     [address], [place], [place] [address]    PLAN OF CARE and INTERVENTIONS:               1.  GOALS OF CARE/ ADVANCE CARE PLANNING:  Patient desires to remain home with the assistance of his brother and nieces.               2.  PATIENT/CAREGIVER EDUCATION:  Follow up with GI, Cirrhosis of the Liver and symptoms.               4. PERSONAL EMERGENCY PLAN:  Activate 911 for emergencies.               5.  DISEASE STATUS:  Patient found in his recliner chair.  Patient continues to report some discomfort to his feet.  His taking 325 mg of Tylenol at bedtime only.  Patient states this is helpful.  Patient denies any falls.  He is using a cane when he is out of the home.  He also has a walker should he require this.   Patient presents with jaundice this am.  He denies increase fatigue, loss of appetite, nausea or weight loss.  Patient does report dryness to his skin with occasional itchy.   He is applying Remedy Skin Repair daily and states this is helpful with the itching.  Abdominal ascites present.  Patient was last seen by GI in November and then again in December for a colonoscopy/endoscopy.  Patient was scheduled to be seen again on Feb 1 but canceled this appointment due to inclement weather.  I have offered to schedule an appointment with GI but patient declined and states he will call himself.  Education provided on Cirrhosis of the Liver and symptoms related to this.   Patient denies insomnia.  States he sleeps well during the night and usually rises around 6 am.  He notes afternoon naps that typically last about 1 hour.  Patient has gained 6 lbs since my visit in February.  Reviewed medications and patient states his furosemide was stopped by  cardiology last month.  Reviewed Epic notes and also patient's paper work from his appointment.  Unable to locate orders to discontinue but possibly due to hyponatremia.  I have contacted the office for Tina Hackney, FNP for order clarification.  Response pending.  Advised patient that I would follow up with him after I speak with Tina Hackney, NP.  Visit scheduled for April 5th at 9 am.      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  Patient is being followed by Palliative Care monthly and PRN.  CODE STATUS: DNR ADVANCED DIRECTIVES: Yes MOST FORM: No PPS: 50%   PHYSICAL EXAM:   VITALS:  Temp 98.1 F, BP 142/58, P 56, R 20 O2 sats 98% LUNGS: CTA, denies shortness of breath. CARDIAC: HRR EXTREMITIES: 1+ bilateral lower leg edema present. SKIN: Jaundice, + for dryness.  NEURO: Alert and oriented.       Julie A Brown, RN 

## 2020-11-10 NOTE — Telephone Encounter (Signed)
327 pm.  Phone call made to patient to provide an update on medications.  Patient answered the phone and he is advised to continue medications as he has been taking them.  Lasix has been discontinued at this time.  Patient has no further questions or concerns.

## 2020-11-10 NOTE — Telephone Encounter (Signed)
1040 am.  Call back received from [facility] [place] confirming patient should not be taking Furosemide at this time.  1219 pm.  Phone call made to patient but no answer.  I will try back again later today.

## 2020-11-11 ENCOUNTER — Other Ambulatory Visit (INDEPENDENT_AMBULATORY_CARE_PROVIDER_SITE_OTHER): Payer: Medicare HMO

## 2020-11-11 ENCOUNTER — Other Ambulatory Visit: Payer: Self-pay

## 2020-11-11 DIAGNOSIS — I509 Heart failure, unspecified: Secondary | ICD-10-CM | POA: Diagnosis not present

## 2020-11-11 LAB — BASIC METABOLIC PANEL
BUN: 44 mg/dL — ABNORMAL HIGH (ref 6–23)
CO2: 24 mEq/L (ref 19–32)
Calcium: 9.4 mg/dL (ref 8.4–10.5)
Chloride: 98 mEq/L (ref 96–112)
Creatinine, Ser: 1.4 mg/dL (ref 0.40–1.50)
GFR: 48.55 mL/min — ABNORMAL LOW (ref >60.00)
Glucose, Bld: 127 mg/dL — ABNORMAL HIGH (ref 70–99)
Potassium: 3.9 mEq/L (ref 3.5–5.1)
Sodium: 136 mEq/L (ref 135–145)

## 2020-11-14 ENCOUNTER — Other Ambulatory Visit: Payer: Self-pay | Admitting: *Deleted

## 2020-11-14 NOTE — Patient Outreach (Signed)
[facility]) Care Management  11/14/2020  Ronold Hardgrove [birth year] [id number]   RN Health Coach attempted follow up outreach call to patient.  Patient was unavailable.No  voicemail was set up.   Plan: RN will call patient again within 30 days.  [place] Care Management [phone]

## 2020-11-18 DIAGNOSIS — D649 Anemia, unspecified: Secondary | ICD-10-CM | POA: Diagnosis not present

## 2020-11-18 DIAGNOSIS — D6869 Other thrombophilia: Secondary | ICD-10-CM | POA: Diagnosis not present

## 2020-11-18 DIAGNOSIS — Z008 Encounter for other general examination: Secondary | ICD-10-CM | POA: Diagnosis not present

## 2020-11-18 DIAGNOSIS — I110 Hypertensive heart disease with heart failure: Secondary | ICD-10-CM | POA: Diagnosis not present

## 2020-11-18 DIAGNOSIS — I2510 Atherosclerotic heart disease of native coronary artery without angina pectoris: Secondary | ICD-10-CM | POA: Diagnosis not present

## 2020-11-18 DIAGNOSIS — I4891 Unspecified atrial fibrillation: Secondary | ICD-10-CM | POA: Diagnosis not present

## 2020-11-18 DIAGNOSIS — I739 Peripheral vascular disease, unspecified: Secondary | ICD-10-CM | POA: Diagnosis not present

## 2020-11-18 DIAGNOSIS — E261 Secondary hyperaldosteronism: Secondary | ICD-10-CM | POA: Diagnosis not present

## 2020-11-18 DIAGNOSIS — K219 Gastro-esophageal reflux disease without esophagitis: Secondary | ICD-10-CM | POA: Diagnosis not present

## 2020-11-18 DIAGNOSIS — I509 Heart failure, unspecified: Secondary | ICD-10-CM | POA: Diagnosis not present

## 2020-11-18 DIAGNOSIS — E785 Hyperlipidemia, unspecified: Secondary | ICD-10-CM | POA: Diagnosis not present

## 2020-11-29 ENCOUNTER — Ambulatory Visit: Payer: Medicare HMO | Admitting: Family Medicine

## 2020-12-05 NOTE — Progress Notes (Signed)
Date:  12/06/2020   ID:  Jordan Perkins, DOB 1941/01/25, MRN [id number]  Patient Location:  [address] [place]  [address]   Provider location:   [facility], [place] office  PCP:  [name], MD  Cardiologist:  Arvid Right [facility]  Chief Complaint  Patient presents with  . Follow-up    3 Months follow up and medications visually verified with patient. Patient brought medications in with him to his visit today.     History of Present Illness:    Jordan Perkins is a 80 y.o. male  past medical history of CAD,  bypass surgery, 2005 at [facility] stents in 2005,  hypertension,  hyperlipidemia,  Chronic atrial fibrillation , on xarelto hospital with shortness of breath and chest pain, rapid atrial fibrillation.  Stress test 2014 showed no ischemia Echocardiogram showed ejection fraction 60-65% Carotid 07/2016: 40 to 59% stenosis on left, <39% on the right Pulmonary HTN Chronic anemia Who presents for follow up of his CAD and atrial fibrillation  Last seen in clinic by myself December 2021 Weight at that time 165 pounds Swelling at that time improved on torsemide daily  Lives with his brother No hospital admissions over the past year Friend drives him Reports unable to drive to [facility] for appointments  History of chronic anemia, Last hemoglobin 9.8 in November 2021  up from 8.0 in August 2021 Reports he is taking iron pill Other lab work reviewed Creatinine 1.4, high end of his range with BUN 44  Echocardiogram July 2021 ejection fraction 60 to 65% Severely elevated right heart pressures No follow-up study since that time  Blood pressure elevated on today's visit but reports it is well controlled at home  Presents today in a wheelchair, overall happy, denies any new symptoms In his spare time reports he places banjo Not very active at baseline given leg weakness  Other past medical history reviewed Seen at [facility] 03/08/2020:  weight 192 pounds Echo 03/2020: pulmonary HTN, EF 60% Weight today 165 pounds  [place]: Atrial Fibrillation occurred continuously (100% burden), ranging from 32-104 bpm (avg of 56 bpm). 2 Ventricular Tachycardia runs occurred, the run with the fastest interval lasting 7 beats with a max rate of 171 bpm (avg 117 bpm);  the run with the fastest interval was also the longest.  Isolated VEs were occasional (2.6%, 28107), VE Couplets were rare (<1.0%, 393), and VE Triplets were rare (<1.0%, 14). Ventricular Bigeminy and Trigeminy were present.  Hospital admission 10/2016 Hyponatremia, weakness, elevated T.Bili (gilberts)  hospital admission in 2014, noted to have gallstones.   negative Hida scan. Now s/p Cholecystectomy 11/14  CT scan in the hospital of his abdomen and pelvis showed acute pancreatitis with increased fluid density in the peri-pancreatic fat, multiple gallstones, small hiatal hernia  Prior CV studies:   The following studies were reviewed today:  Echocardiogram showed ejection fraction 60-65%, mild to moderate TR, moderately elevated right ventricular systolic pressures  Past Medical History:  Diagnosis Date  . (HFpEF) heart failure with preserved ejection fraction ([place])   . CHF (congestive heart failure) ([place])   . Coronary artery disease   . Hyperlipidemia   . Hypertension   . PAH (pulmonary artery hypertension) ([place])   . Permanent atrial fibrillation ([place])   . Pleural effusion    Past Surgical History:  Procedure Laterality Date  . CARDIAC CATHETERIZATION    . CHOLECYSTECTOMY    . COLONOSCOPY WITH PROPOFOL N/A 08/23/2020  Procedure: COLONOSCOPY WITH PROPOFOL;  Surgeon: [name], MD;  Location: [facility] ENDOSCOPY;  Service: Gastroenterology;  Laterality: N/A;  . CORONARY ANGIOPLASTY  06/2004   s/p stent placement @ [facility]  . CORONARY ARTERY BYPASS GRAFT  04-24-2004   CABG x 3 [facility]  . ESOPHAGOGASTRODUODENOSCOPY (EGD) WITH PROPOFOL N/A 08/23/2020   Procedure:  ESOPHAGOGASTRODUODENOSCOPY (EGD) WITH PROPOFOL;  Surgeon: [name], MD;  Location: [place];  Service: Gastroenterology;  Laterality: N/A;  Needs to be last case     Current Meds  Medication Sig  . atorvastatin (LIPITOR) 80 MG tablet TAKE 1 TABLET BY MOUTH EVERY DAY  . Elastic Bandages & Supports (MEDICAL COMPRESSION SOCKS) MISC 1 application by Does not apply route daily.  . ferrous sulfate 325 (65 FE) MG tablet Take 325 mg by mouth daily with breakfast.  . pantoprazole (PROTONIX) 40 MG tablet Take 1 tablet (40 mg total) by mouth daily.  . rivaroxaban (XARELTO) 20 MG TABS tablet Take 1 tablet (20 mg total) by mouth daily with supper.  Marland Kitchen spironolactone (ALDACTONE) 25 MG tablet Take 1 tablet (25 mg total) by mouth daily.  Marland Kitchen torsemide (DEMADEX) 20 MG tablet Take 1 tablet (20 mg total) by mouth daily.     Allergies:   Sulfa antibiotics and Other   Social History   Tobacco Use  . Smoking status: Never Smoker  . Smokeless tobacco: Never Used  Vaping Use  . Vaping Use: Never used  Substance Use Topics  . Alcohol use: No  . Drug use: No      Family Hx: The patient's family history includes Heart attack in his brother; Heart disease in his father and paternal uncle.  ROS:   Please see the history of present illness.    Review of Systems  Constitutional: Negative.   Respiratory: Negative.   Cardiovascular: Positive for leg swelling.  Gastrointestinal: Negative.   Musculoskeletal: Negative.   Neurological: Negative.   Psychiatric/Behavioral: Negative.   All other systems reviewed and are negative.    Labs/Other Tests and Data Reviewed:    Recent Labs: 04/03/2020: Magnesium 2.1 04/30/2020: B Natriuretic Peptide 283.6 07/18/2020: ALT 17; Platelets 180 08/08/2020: Hemoglobin 10.8 11/11/2020: BUN 44; Creatinine, Ser 1.40; Potassium 3.9; Sodium 136   Recent Lipid Panel Lab Results  Component Value Date/Time   CHOL 75 03/24/2020 04:34 AM   CHOL 89 (L) 04/06/2015  04:42 PM   CHOL 150 06/06/2013 09:47 AM   TRIG 42 03/24/2020 04:34 AM   TRIG 85 06/06/2013 09:47 AM   HDL 21 (L) 03/24/2020 04:34 AM   HDL 40 04/06/2015 04:42 PM   HDL 43 06/06/2013 09:47 AM   CHOLHDL 3.6 03/24/2020 04:34 AM   LDLCALC 46 03/24/2020 04:34 AM   LDLCALC 36 04/06/2015 04:42 PM   LDLCALC 90 06/06/2013 09:47 AM    Wt Readings from Last 3 Encounters:  12/06/20 167 lb (75.8 kg)  11/10/20 170 lb (77.1 kg)  10/27/20 169 lb 2 oz (76.7 kg)     Exam:    BP (!) 160/60 (BP Location: Right Arm, Patient Position: Sitting, Cuff Size: Normal)   Pulse 65   Ht 5\' 11"  (1.803 m)   Wt 167 lb (75.8 kg)   SpO2 99%   BMI 23.29 kg/m  Constitutional:  oriented to person, place, and time. No distress.  HENT:  Head: Grossly normal Eyes:  no discharge. No scleral icterus.  Neck: No JVD, no carotid bruits  Cardiovascular: Irregularly irregular no murmurs appreciated Pulmonary/Chest: Clear to auscultation  bilaterally, no wheezes or rails Abdominal: Soft.  no distension.  no tenderness.  Musculoskeletal: Normal range of motion Neurological:  normal muscle tone. Coordination normal. No atrophy Skin: Skin warm and dry Psychiatric: normal affect, pleasant   ASSESSMENT & PLAN:    Atrial fibrillation, unspecified type (HCC) Permanent atrial fibrillation On anticoagulation,  Rate controlled, not on beta-blocker  Coronary artery disease of native artery of native heart with stable angina pectoris (HCC) Currently with no symptoms of angina. No further workup at this time. Continue current medication regimen.  Chronic diastolic CHF/pulmonary hypertension Recommend he continue torsemide daily Reports he is unable to go to [facility] for additional pulmonary hypertension work-up, friend has to drive him everywhere, means are limited We will start sildenafil 20 mg 3 times daily Ordered repeat echocardiogram for 1 month  PAD (peripheral artery disease) ([place]) Denies claudication  symptoms  Bilateral carotid artery disease, unspecified type ([place]) Cholesterol at goal Periodic ultrasound  S/P CABG (coronary artery bypass graft) Denies anginal symptoms  Essential hypertension Blood pressure elevated on today's visit, Reports is well controlled at home, recommend he continue to monitor at home and call our office with numbers    Total encounter time more than 25 minutes  Greater than 50% was spent in counseling and coordination of care with the patient   Signed, Ida Rogue, MD  12/06/2020 3:09 PM    [place] Office [address], [place], [place] [address]

## 2020-12-06 ENCOUNTER — Ambulatory Visit (INDEPENDENT_AMBULATORY_CARE_PROVIDER_SITE_OTHER): Payer: Medicare HMO | Admitting: Cardiovascular Disease

## 2020-12-06 ENCOUNTER — Other Ambulatory Visit: Payer: Self-pay

## 2020-12-06 ENCOUNTER — Encounter: Payer: Self-pay | Admitting: Cardiovascular Disease

## 2020-12-06 VITALS — BP 160/60 | HR 65 | Ht 71.0 in | Wt 167.0 lb

## 2020-12-06 DIAGNOSIS — I10 Essential (primary) hypertension: Secondary | ICD-10-CM | POA: Diagnosis not present

## 2020-12-06 DIAGNOSIS — I739 Peripheral vascular disease, unspecified: Secondary | ICD-10-CM | POA: Diagnosis not present

## 2020-12-06 DIAGNOSIS — D649 Anemia, unspecified: Secondary | ICD-10-CM | POA: Diagnosis not present

## 2020-12-06 DIAGNOSIS — I5032 Chronic diastolic (congestive) heart failure: Secondary | ICD-10-CM | POA: Diagnosis not present

## 2020-12-06 DIAGNOSIS — I4891 Unspecified atrial fibrillation: Secondary | ICD-10-CM

## 2020-12-06 DIAGNOSIS — E871 Hypo-osmolality and hyponatremia: Secondary | ICD-10-CM | POA: Diagnosis not present

## 2020-12-06 DIAGNOSIS — I272 Pulmonary hypertension, unspecified: Secondary | ICD-10-CM

## 2020-12-06 DIAGNOSIS — I25118 Atherosclerotic heart disease of native coronary artery with other forms of angina pectoris: Secondary | ICD-10-CM | POA: Diagnosis not present

## 2020-12-06 MED ORDER — SILDENAFIL CITRATE 20 MG PO TABS: 20.0000 mg | ORAL_TABLET | Freq: Three times a day (TID) | ORAL | 7 refills | Status: DC

## 2020-12-06 NOTE — Progress Notes (Signed)
After attempts to reach the patient a letter was mailed today with lab results.  Erasmus Bistline,cma

## 2020-12-06 NOTE — Patient Instructions (Addendum)
Medication Instructions:  Please START  sildenafil 20 mg three times a day  (morning, noon, and evening)  Make sure you take iron pill daily  Lab work: No new labs needed  Testing/Procedures: Echo  (pulmonary hypertension), Schedule for one from today Your physician has requested that you have an echocardiogram. Echocardiography is a painless test that uses sound waves to create images of your heart. It provides your doctor with information about the size and shape of your heart and how well your heart's chambers and valves are working. This procedure takes approximately one hour. There are no restrictions for this procedure.  There is a possibility that an IV may need to be started during your test to inject an image enhancing agent. This is done to obtain more optimal pictures of your heart. Therefore we ask that you do at least drink some water prior to coming in to hydrate your veins.    Follow-Up:   . You will need a follow up appointment in 3 months  . Providers on your designated Care Team:   . Murray Hodgkins, NP . Christell Faith, PA-C . Marrianne Mood, PA-C

## 2020-12-13 ENCOUNTER — Other Ambulatory Visit: Payer: Self-pay

## 2020-12-13 ENCOUNTER — Other Ambulatory Visit: Payer: Self-pay | Admitting: Family Medicine

## 2020-12-13 ENCOUNTER — Other Ambulatory Visit: Payer: Medicare HMO

## 2020-12-13 VITALS — BP 112/50 | HR 58 | Temp 99.9°F | Resp 20 | Wt 168.0 lb

## 2020-12-13 DIAGNOSIS — Z515 Encounter for palliative care: Secondary | ICD-10-CM

## 2020-12-13 NOTE — Progress Notes (Signed)
PATIENT NAME: Jordan Perkins DOB: 26-Jan-1941 MRN: [id number]  PRIMARY CARE PROVIDER: [name], MD  RESPONSIBLE PARTY:  Acct ID - Guarantor Home Phone Work Phone Relationship Acct Type  [id number] - [phone]  Self P/F     [place], [place], [place] [address]    PLAN OF CARE and INTERVENTIONS:               1.  GOALS OF CARE/ ADVANCE CARE PLANNING:  Patient desires to remain home and independent.               2.  PATIENT/CAREGIVER EDUCATION:   CHF               4. PERSONAL EMERGENCY PLAN:  Patient will activate 911 for emergencies.               5.  DISEASE STATUS:  Patient found in his recliner chair with legs elevated above heart.  Patient states his brother had a "light stroke" on Sunday and is currently in the hospital.  Patient states he has been home alone but his family came Sunday and yesterday.  He also has a landlord that checks in daily.  Patient denies any changes since my last visit. Patient reports ongoing fatigue and will rest when needed.  He is mostly in his recliner chair during the day.  Patient reports napping for about 1 1/2 hours a day.  No issues with insomnia.  Patient is using a rolling walker in the home.  No falls are reported.  Patient is weighing himself daily and checking blood pressures.  Blood pressure yesterday was 146/50.  Patient is keeping a log of blood pressures.  Weights are ranging between 167-168.  Patient understands to contact cardiology should there be a weight gain and/or shortness of breath.  Patient is scheduled for echocardiogram at the end of this month.  Patient has been out of Ferrous Sulfate for about 1 week now.  I have contacted [facility] in [place] and refill order is needed from PCP.  Medication should be ready by Friday.  Next visit scheduled for 5/3 at 9 am.   HISTORY OF PRESENT ILLNESS:  80 year old male with Cirrhosis of the Liver and CHF.  Patient is being followed by Palliative Care monthly and  PRN.  CODE STATUS: DNR ADVANCED DIRECTIVES: Yes MOST FORM: No PPS: 50%   PHYSICAL EXAM:   VITALS: Today's Vitals   12/13/20 0921  BP: (!) 112/50  Pulse: (!) 58  Resp: 20  Temp: 99.9 F (37.7 C)  SpO2: 98%  Weight: 168 lb (76.2 kg)  PainSc: 0-No pain    LUNGS: CTA,  denies shortness of breath. CARDIAC: irregular EXTREMITIES: + for edema.  Bilateral lower calves. SKIN: Warm and dry to touch.  + for dryness currently using cream for dry skin  NEURO: Alert and oriented x 3       Lorenza Burton, RN

## 2020-12-15 ENCOUNTER — Other Ambulatory Visit: Payer: Self-pay

## 2020-12-15 ENCOUNTER — Ambulatory Visit (INDEPENDENT_AMBULATORY_CARE_PROVIDER_SITE_OTHER): Payer: Medicare HMO | Admitting: Podiatry

## 2020-12-15 ENCOUNTER — Encounter: Payer: Self-pay | Admitting: Podiatry

## 2020-12-15 DIAGNOSIS — M79674 Pain in right toe(s): Secondary | ICD-10-CM | POA: Diagnosis not present

## 2020-12-15 DIAGNOSIS — M79675 Pain in left toe(s): Secondary | ICD-10-CM | POA: Diagnosis not present

## 2020-12-15 DIAGNOSIS — B351 Tinea unguium: Secondary | ICD-10-CM

## 2020-12-15 DIAGNOSIS — N183 Chronic kidney disease, stage 3 unspecified: Secondary | ICD-10-CM | POA: Diagnosis not present

## 2020-12-15 DIAGNOSIS — I739 Peripheral vascular disease, unspecified: Secondary | ICD-10-CM | POA: Diagnosis not present

## 2020-12-15 DIAGNOSIS — N1831 Chronic kidney disease, stage 3a: Secondary | ICD-10-CM

## 2020-12-15 NOTE — Progress Notes (Signed)
This patient returns to my office for at risk foot care.  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  This patient is unable to cut nails himself since the patient cannot reach his nails.These nails are painful walking and wearing shoes.  This patient presents for at risk foot care today.  General Appearance  Alert, conversant and in no acute stress.  Vascular  Dorsalis pedis and posterior tibial  pulses are absent  bilaterally.  Capillary return is within normal limits  bilaterally. Cold feet.  Bilaterally.  Venous disease both feet/legs.  Neurologic  Senn-Weinstein monofilament wire test within normal limits  bilaterally. Muscle power within normal limits bilaterally.  Nails Thick disfigured discolored nails with subungual debris  from hallux to fifth toes bilaterally. No evidence of bacterial infection or drainage bilaterally.  Orthopedic  No limitations of motion  feet .  No crepitus or effusions noted.  No bony pathology or digital deformities noted.  Ankle arthritis.  Skin  normotropic skin with no porokeratosis noted bilaterally.  No signs of infections or ulcers noted.     Onychomycosis  Pain in right toes  Pain in left toes  Consent was obtained for treatment procedures.   Mechanical debridement of nails 1-5  bilaterally performed with a nail nipper.  Filed with dremel without incident.    Return office visit  3 months                    Told patient to return for periodic foot care and evaluation due to potential at risk complications.   Camil Wilhelmsen DPM  

## 2020-12-21 ENCOUNTER — Other Ambulatory Visit: Payer: Self-pay | Admitting: *Deleted

## 2020-12-21 NOTE — Patient Instructions (Signed)
Goals Addressed              This Visit's Progress   .  [facility]) Follow My Treatment Plan (pt-stated)   On track     Timeframe:  Long-Range Goal Priority:  Medium Start Date:  24268341                           Expected End Date:   96222979                   Follow Up Date [id number]   - keep follow-up appointments - keep taking my medicines, even when I feel good    Why is this important?   Staying as healthy as you can is very important. This may mean making changes if you smoke, don't exercise or eat poorly.  A healthy lifestyle is an important goal for you.  Following the treatment plan and making changes may be hard.  Try some of these steps to help keep the disease from getting worse.     Notes: 10/10/20 following treatment plan wt managed, swelling reported resolved, cooking at home, going to appointments as weather permits, taking meds as ordered 09/08/20 still indicating not making transportation appointments as needed     .  [facility]) Make and Keep All Appointments (pt-stated)   On track     Timeframe:  Long-Range Goal Priority:  Medium Start Date:   [id number]                          Expected End Date:   18563149                   Follow Up Date [id number]   - arrange a ride through an agency 1 week before appointment - ask family or friend for a ride - call to cancel if needed - keep a calendar with appointment dates    Why is this important?   Part of staying healthy is seeing the doctor for follow-up care.  If you forget your appointments, there are some things you can do to stay on track.    Notes: 10/10/20 has not missed an appointment other than for inclement weather since last outreach Pending 10/11/20 appointment but has a plan to get transportation if roads clear or reschedule prn  09/08/20 still voicing concerns with making transportation appointments    .  ([facility])Track and Manage Symptoms-Heart Failure   On track     Timeframe:  Long-Range Goal Priority:   High Start Date:     [id number]                        Expected End Date:     [id number]                  Follow Up Date [id number]   - develop a rescue plan - eat more whole grains, fruits and vegetables, lean meats and healthy fats - follow rescue plan if symptoms flare-up - know when to call the doctor - track symptoms and what helps feel better or worse    Why is this important?    You will be able to handle your symptoms better if you keep track of them.   Making some simple changes to your lifestyle will help.   Eating healthy is one thing you can do to take good care of yourself.  Notes:  12/21/2020 Patient is monitoring weight daily and taking medications as per ordered

## 2020-12-21 NOTE — Patient Outreach (Addendum)
[facility]) Care Management  [place]  12/21/2020   Jordan Perkins Oct 17, 1940 [id number]  [name] telephone call to patient.  [place] compliance verified. Per patient he weighs everyday. Patient stated that he takes his medications as per ordered. Per patient he is walking everyday. Patient is having pain in her feet and uses a cane. Patient stated that he has had COVID vaccines x 2. He has not had the booster. Per patient stated that he is having pain in his feet.  He uses a cane to ambulate. Per patient he has transportation to Dr visits by his landlord.  Patient has agreed to follow up outreach calls.   Encounter Medications:  Outpatient Encounter Medications as of 12/21/2020  Medication Sig  . atorvastatin (LIPITOR) 80 MG tablet TAKE 1 TABLET BY MOUTH EVERY DAY  . ferrous sulfate 325 (65 FE) MG tablet TAKE 1 TABLET BY MOUTH EVERY DAY  . pantoprazole (PROTONIX) 40 MG tablet Take 1 tablet (40 mg total) by mouth daily.  . rivaroxaban (XARELTO) 20 MG TABS tablet Take 1 tablet (20 mg total) by mouth daily with supper.  . sildenafil (REVATIO) 20 MG tablet Take 1 tablet (20 mg total) by mouth 3 (three) times daily.  Marland Kitchen spironolactone (ALDACTONE) 25 MG tablet Take 1 tablet (25 mg total) by mouth daily.  Marland Kitchen torsemide (DEMADEX) 20 MG tablet Take 1 tablet (20 mg total) by mouth daily.  Water engineer Bandages & Supports (MEDICAL COMPRESSION SOCKS) MISC 1 application by Does not apply route daily. (Patient not taking: Reported on 12/21/2020)   No facility-administered encounter medications on file as of 12/21/2020.    Functional Status:  In your present state of health, do you have any difficulty performing the following activities: 10/27/2020 06/06/2020  Hearing? N N  Vision? N N  Difficulty concentrating or making decisions? N N  Walking or climbing stairs? N N  Comment - -  Dressing or bathing? N N  Doing errands, shopping? N [place]  and eating ? - -  Using the Toilet? - -  In the past six months, have you accidently leaked urine? - -  Do you have problems with loss of bowel control? - -  Managing your Medications? - -  Managing your Finances? - -  Housekeeping or managing your Housekeeping? - -  Some recent data might be hidden    Fall/Depression Screening: Fall Risk  12/21/2020 10/27/2020 10/10/2020  Falls in the past year? 0 0 1  Comment - - -  Number falls in past yr: 0 0 1  Injury with Fall? 0 0 0  Risk for fall due to : Impaired balance/gait - History of fall(s)  Follow up Falls evaluation completed Falls evaluation completed Falls evaluation completed;Falls prevention discussed   PHQ 2/9 Scores 10/10/2020 08/30/2020 07/07/2020 05/30/2020 05/18/2020 04/13/2020 04/08/2020  PHQ - 2 Score 0 0 0 0 0 0 0    Assessment:  Goals Addressed              This Visit's Progress   .  [facility]) Follow My Treatment Plan (pt-stated)   On track     Timeframe:  Long-Range Goal Priority:  Medium Start Date:  [id number]                           Expected End Date:   01027253  Follow Up Date [id number]   - keep follow-up appointments - keep taking my medicines, even when I feel good    Why is this important?   Staying as healthy as you can is very important. This may mean making changes if you smoke, don't exercise or eat poorly.  A healthy lifestyle is an important goal for you.  Following the treatment plan and making changes may be hard.  Try some of these steps to help keep the disease from getting worse.     Notes: 10/10/20 following treatment plan wt managed, swelling reported resolved, cooking at home, going to appointments as weather permits, taking meds as ordered 09/08/20 still indicating not making transportation appointments as needed     .  [facility]) Make and Keep All Appointments (pt-stated)   On track     Timeframe:  Long-Range Goal Priority:  Medium Start Date:   [id number]                           Expected End Date:   [id number]                   Follow Up Date [id number]   - arrange a ride through an agency 1 week before appointment - ask family or friend for a ride - call to cancel if needed - keep a calendar with appointment dates    Why is this important?   Part of staying healthy is seeing the doctor for follow-up care.  If you forget your appointments, there are some things you can do to stay on track.    Notes: 10/10/20 has not missed an appointment other than for inclement weather since last outreach Pending 10/11/20 appointment but has a plan to get transportation if roads clear or reschedule prn  09/08/20 still voicing concerns with making transportation appointments    .  ([facility])Track and Manage Symptoms-Heart Failure   On track     Timeframe:  Long-Range Goal Priority:  High Start Date:     [id number]                        Expected End Date:     06301601                  Follow Up Date [id number]   - develop a rescue plan - eat more whole grains, fruits and vegetables, lean meats and healthy fats - follow rescue plan if symptoms flare-up - know when to call the doctor - track symptoms and what helps feel better or worse    Why is this important?    You will be able to handle your symptoms better if you keep track of them.   Making some simple changes to your lifestyle will help.   Eating healthy is one thing you can do to take good care of yourself.    Notes:  12/21/2020 Patient is monitoring weight daily and taking medications as per ordered       Plan:  Follow-up:  Patient agrees to Care Plan and Follow-up. Provided education on CHF exacerbation Provided education on F.A.S.T. acronym for stroke RN sent update assessment to PCP RN will follow up within the month of July  [name] [phone]

## 2020-12-25 ENCOUNTER — Other Ambulatory Visit: Payer: Self-pay | Admitting: Cardiovascular Disease

## 2021-01-03 ENCOUNTER — Other Ambulatory Visit: Payer: Medicare HMO

## 2021-01-06 ENCOUNTER — Other Ambulatory Visit: Payer: Self-pay | Admitting: Cardiovascular Disease

## 2021-01-06 DIAGNOSIS — I509 Heart failure, unspecified: Secondary | ICD-10-CM

## 2021-01-10 ENCOUNTER — Other Ambulatory Visit: Payer: Medicare HMO

## 2021-01-10 ENCOUNTER — Other Ambulatory Visit: Payer: Self-pay

## 2021-01-10 VITALS — BP 154/52 | HR 57 | Temp 97.8°F | Resp 18 | Wt 170.0 lb

## 2021-01-10 DIAGNOSIS — Z515 Encounter for palliative care: Secondary | ICD-10-CM

## 2021-01-10 NOTE — Progress Notes (Addendum)
PATIENT NAME: Jordan Perkins DOB: 06-14-1941 MRN: [id number]  PRIMARY CARE PROVIDER: [name], MD  RESPONSIBLE PARTY:  Acct ID - Guarantor Home Phone Work Phone Relationship Acct Type  [id number] - [phone]  Self P/F     [place], [place],  [address]    PLAN OF CARE and INTERVENTIONS:               1.  GOALS OF CARE/ ADVANCE CARE PLANNING:  Remain home and independent for as long as possible.               2.  PATIENT/CAREGIVER EDUCATION:  CHF/Cirrhosis               4. PERSONAL EMERGENCY PLAN:  911 for emergencies.               5.  DISEASE STATUS:  Joint visit completed with [place], SW.  Patient is found warming up his breakfast on arrival.  He is using a cane in the home.  Gait is slow and steady.  Patient denies any falls.   Po intake remains good.  Family is bringing in meals on the weekends.  Patient reports freezer meals when he does not cook meals.  He continues with MOW daily.    Patient denies issues with insomnia, nausea/vomiting, shortness of breath or constipation.  He remains continent of bowel and bladder.  Patient obtaining daily weights and blood pressures.  Weights range from 168-171 lbs.  He reports an average of blood pressures 140/50 with pulse in the 70's.  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  Patient asking questions about his diet and low sodium foods.  Education provided.  Symptoms are well-managed at this time.  Patient will follow up with cardiology later this month for an ultrasound.  Next visit scheduled for June 2 at 37 am.       HISTORY OF PRESENT ILLNESS:  80 year old male with hx of Cirrhosis and CHF.  Patient is being followed by Palliative Care monthly and PRN.  CODE STATUS: DNR-no form in the home ADVANCED DIRECTIVES: Yes MOST FORM: No PPS: 50%   PHYSICAL EXAM:   VITALS: Today's Vitals   01/10/21 0905  BP: (!) 154/52  Pulse: (!) 57  Resp: 18  Temp: 97.8 F (36.6 C)  SpO2: 98%   Weight: 170 lb (77.1 kg)  PainSc: 3   PainLoc: Foot    LUNGS: CTA.  No shortness of breath. CARDIAC: HRR EXTREMITIES: - for edema SKIN: warm and dry to touch.  Lotion applied to lower extremities due to extreme dryness. NEURO: alert and oriented x 4       Lorenza Burton, RN

## 2021-01-10 NOTE — Progress Notes (Signed)
COMMUNITY PALLIATIVE CARE SW NOTE  PATIENT NAME: Jordan Perkins DOB: 1940/10/10 MRN: [id number]  PRIMARY CARE PROVIDER: [name], MD  RESPONSIBLE PARTY:  Acct ID - Guarantor Home Phone Work Phone Relationship Acct Type  [id number] - [phone]  Self P/F     [place], [place], [place] [address]     PLAN OF CARE and INTERVENTIONS:             1. GOALS OF CARE/ ADVANCE CARE PLANNING:  Patient is a DNR, form not in the home, will follow up with NP. Patient's goal is to remain at home with brother.  2.         SOCIAL/EMOTIONAL/SPIRITUAL ASSESSMENT/ INTERVENTIONS:  SW and RN Almyra Free met with patient in patients home for follow up visit. Patient lives in a single story mobile home with his brother. Patient updated SW an Therapist, sports on medical condition.  Patient reports no pain other than minor pains while walking and patient takes Tylenol, patient also keeps his feet elevated. Patients skin to bilateral ankles are extremely dry, lotion applied during visit.  Patient shares that he sleeps well and naps during the day. No loss in appetite, patient preps and cooks his own meals. No falls reported.   RN reviewed medications and took vitals. Patient encouraged to weigh himself daily.   SW discussed goals, reviewed care plan, provided emotional support, used active and reflective listening. Palliative care will continue to monitor and assist with long term care planning as needed.   3.         PATIENT/CAREGIVER EDUCATION/ COPING:  Patient A&O x3, Pt able to answer all questions appropriately. Patient has normal motor skills. Patient has good reasoning ability. No S/S of depression or anxiety. Patients family is supportive. Brother resides with him and niece lives close by and checks on him often. Patient shares that he enjoyed playing the banjo and guitar.   4.         PERSONAL EMERGENCY PLAN:  Patient will call 9-1-1 for emergencies.   5.         COMMUNITY RESOURCES COORDINATION/  HEALTH CARE NAVIGATION:  Patient manages care.  6.         FINANCIAL/LEGAL CONCERNS/INTERVENTIONS:  None.      SOCIAL HX:  Social History   Tobacco Use  . Smoking status: Never Smoker  . Smokeless tobacco: Never Used  Substance Use Topics  . Alcohol use: No    CODE STATUS: DNR ADVANCED DIRECTIVES: Y MOST FORM COMPLETE:  N HOSPICE EDUCATION PROVIDED: N  PPS: Patient is independent with all ADL's. Patient does not drive.    Time spent: 45 min       [place], [place]

## 2021-02-02 ENCOUNTER — Other Ambulatory Visit: Payer: Self-pay

## 2021-02-02 ENCOUNTER — Ambulatory Visit (INDEPENDENT_AMBULATORY_CARE_PROVIDER_SITE_OTHER): Payer: Medicare HMO

## 2021-02-02 DIAGNOSIS — I509 Heart failure, unspecified: Secondary | ICD-10-CM

## 2021-02-02 LAB — ECHOCARDIOGRAM COMPLETE
AR max vel: 2.51 cm2
AV Area VTI: 2.58 cm2
AV Area mean vel: 2.5 cm2
AV Mean grad: 4 mmHg
AV Peak grad: 7.7 mmHg
Ao pk vel: 1.39 m/s
Area-P 1/2: 5.09 cm2
MV VTI: 2.23 cm2
S' Lateral: 2.1 cm

## 2021-02-09 ENCOUNTER — Other Ambulatory Visit: Payer: Self-pay

## 2021-02-09 ENCOUNTER — Other Ambulatory Visit: Payer: Medicare HMO

## 2021-02-09 VITALS — BP 132/54 | HR 52 | Temp 98.4°F | Resp 16 | Wt 169.0 lb

## 2021-02-09 DIAGNOSIS — Z515 Encounter for palliative care: Secondary | ICD-10-CM

## 2021-02-09 NOTE — Progress Notes (Signed)
PATIENT NAME: Jordan Perkins DOB: Nov 13, 1940 MRN: [id number]  PRIMARY CARE PROVIDER: [name], MD  RESPONSIBLE PARTY:  Acct ID - Guarantor Home Phone Work Phone Relationship Acct Type  [id number] - [phone]  Self P/F     [place], [place], [place] [address]    PLAN OF CARE and INTERVENTIONS:               1.  GOALS OF CARE/ ADVANCE CARE PLANNING:  Remain home and independent with the assistance of his family.               2.  PATIENT/CAREGIVER EDUCATION:  DNR vs Full Code.               4. PERSONAL EMERGENCY PLAN:  Activate 911 for emergencies.               5.  DISEASE STATUS:  Patient found ambulating with a cane from the kitchen.  Patient states he is doing well and was seen last week for an echocardiogram.  Patient is uncertain of when his follow up visit is with Dr. Rockey Situ.  Chart reviewed and patient advised of his date/time of appointment.  Patient ambulating with a cane and also has a rolling walker.  Patient's gait is slow and steady.  No falls are reported. Patient feels his right leg is weaker than his left but states this has been ongoing for about 3 years.  Questioned patient about his DNR form that was dropped off last month.  Patient states, "I don't want to mess with that".  Further discussion completed and explained the difference between full and DNR status.  Patient verbalizes understanding and states he would want CPR attempted.  At this time, he would want to be transferred to the hospital with full scope of medical interventions.   Patient states his appetite continues to be good.  He is cooking meals and family continues to bring meals on the weekends.  Reviewed medications and patient confirms he is compliant and denies an need for any refills.   Patient reports having some shortness of breath with exertion.   Patient denies any worsening shortness of breath or coughing.  Bowel movements are regular with last one yesterday.  BS x 4  quads and fluid present to abdomen.  Patient denies any issues with nausea or vomiting.  Patient denies any changes and has no new concerns at this time.  HISTORY OF PRESENT ILLNESS:  80 year old male with a history of CHF and Atrial Fibrillation.  Patient is being followed by Palliative Care monthly and PRN.  CODE STATUS: Full ADVANCED DIRECTIVES: Yes MOST FORM: No PPS: 50%   PHYSICAL EXAM:   VITALS: Today's Vitals   02/09/21 0859  BP: (!) 132/54  Pulse: (!) 52  Resp: 16  Temp: 98.4 F (36.9 C)  SpO2: 97%  Weight: 169 lb (76.7 kg)  PainSc: 0-No pain    LUNGS: CTA, shortness of breath with exertion, no rhonchi, wheezes or rales present. CARDIAC: Irregular EXTREMITIES: - for edema SKIN: Warm and dry to touch.  No skin breakdown present. NEURO: alert and oriented x 4       Lorenza Burton, RN

## 2021-02-13 ENCOUNTER — Telehealth: Payer: Self-pay

## 2021-02-13 NOTE — Telephone Encounter (Signed)
Able to reach pt regarding his recent echo, Dr. Rockey Situ had a chance to review his results and advised   "Echo  Right heart pressures sill very elevated on echo  Previously mentioned difficulty getting rides to [facility] to go to advanced heart failure clinic  Would he be able to get transportation to the [place] clinic?  Limited work-up could be done in [place], we could consider right heart catheterization to verify right heart pressures which are very elevated on echo  -Would he like to do right heart catheterization at [facility]?  There may be additional medications that might be able to be used"  Advised Mr. Domagala will need an appt to review heart catheterization risk and benefits before scheduling, pt points out he is not familiar or understands the heart catheterization, will also need to find a ride and someone to stay with him during the procedure and get him back home. Mr. Makar verbalized understanding.  Was able to get Mr. Piper a sooner appt, schedule for Friday 6/24 at 08:30 am, he will try and get him a ride for that appt as well, at this time no other concerns. Agreeable to plan, will call back for anything further.

## 2021-03-02 NOTE — Progress Notes (Deleted)
Cardiology Office Note    Date:  03/02/2021   ID:  Jordan Perkins 03-Mar-1941, MRN [id number]  PCP:  [name], MD  Cardiologist:  Ida Rogue, MD  Electrophysiologist:  None   Chief Complaint: Follow up  History of Present Illness:   Jordan Perkins is a 80 y.o. male with history of CAD status post 3-vessel CABG in 04/2004 with subsequent PCI in 06/2004, permanent A. fib, RV failure/HFpEF, pulmonary hypertension, pleural effusion status post right-sided thoracentesis in 02/2019, cirrhosis with ascites status post paracentesis in 07/2020, CKD stage III, HTN, HLD, mild carotid artery disease, and anemia who presents for follow-up of recent echo.  Prior LHC in 04/2004 at [facility] showed multivessel CAD with the patient subsequently undergoing three-vessel CABG with subsequent PCI in 06/2004 with details unclear.  Most recent ischemic evaluation noted to be 2014 with no significant ischemia.  He has been followed by Dr. Rockey Situ and the [facility] CHF clinic for CAD, HFpEF, pulmonary hypertension, and permanent A. fib over the years.  Echo from 04/2004 showed a hyperdynamic LV systolic function with an EF of 70 to 67%, diastolic dysfunction, trivial mitral regurgitation, mildly dilated left atrium, normal RV systolic function and ventricular cavity size, trivial tricuspid regurgitation, and unable to estimate PASP.  Repeat echo in 05/2013 showed an EF of 60 to 65%, normal RV systolic function and ventricular cavity size, mildly dilated left atrium, mild to moderate tricuspid regurgitation, and mildly elevated PASP estimated at 37.6 mmHg.  Echo in 02/2019 showed a hyperdynamic LV systolic function with an EF greater than 65%, evidence of right ventricular volume and pressure overload with an estimated PASP of 69.8 mmHg, moderately reduced RV systolic function with moderately enlarged RV cavity size and mildly increased RV wall thickness, mild biatrial enlargement, degenerative mitral  valve with moderate mitral annular calcification, mild to moderate tricuspid regurgitation, mildly calcified aortic valve with trivial aortic insufficiency.  Echo in 03/2020 showed an EF of 60 to 65%, mildly reduced RV systolic function with mildly enlarged RV cavity size, severely elevated PASP estimated at 105.2 mmHg, mild biatrial enlargement, mild mitral regurgitation, trivial aortic insufficiency, mild aortic valve sclerosis without evidence of stenosis, and an estimated right atrial pressure of 15 mmHg.  Outpatient cardiac monitoring in 04/2020 showed a 100% A. fib burden with NSVT with the longest and fastest interval lasting 7 beats.  He has been unable to be evaluated in the advanced heart failure clinic in [facility] for additional pulmonary hypertension work-up and management secondary to limitations of transportation.  He was seen in the office in 08/2020 with a note indicating a 30 pound weight loss over the preceding 6 months and was continued on torsemide 20 mg daily.  He was last seen in the office in 11/2020 with a stable weight of 167 pounds.  BP was elevated at 160/60.  He denied any new symptoms.  He was started on sildenafil 20 mg 3 times daily and continued on torsemide with follow-up echo on 02/02/2021 demonstrating an EF of 55 to 60%, no regional wall motion abnormalities, grade 2 diastolic dysfunction, mildly reduced RV systolic function with moderately enlarged RV cavity size, severely dilated left atrium, moderately dilated right atrium, degenerative mitral valve with mild regurgitation, mild aortic valve sclerosis without evidence of stenosis, and an estimated right atrial pressure of 15 mmHg.  In this setting, it was recommended he follow-up today to discuss possible RHC along with further discussion of attempting to line up transportation to the  advanced heart failure clinic in [place].  ***   Labs independently reviewed: 11/2020 - potassium 3.9, BUN 44, serum creatinine  1.4 07/2020 - Hgb 10.8, PLT 180, albumin 3.9, AST/ALT normal 03/2020 - magnesium 2.1, TC 75, TG 42, HDL 21, LDL 46, A1c 5.8 02/2020 - TSH normal  Past Medical History:  Diagnosis Date   (HFpEF) heart failure with preserved ejection fraction (HCC)    CHF (congestive heart failure) ([place])    Coronary artery disease    Hyperlipidemia    Hypertension    PAH (pulmonary artery hypertension) (HCC)    Permanent atrial fibrillation (HCC)    Pleural effusion     Past Surgical History:  Procedure Laterality Date   CARDIAC CATHETERIZATION     CHOLECYSTECTOMY     COLONOSCOPY WITH PROPOFOL N/A 08/23/2020   Procedure: COLONOSCOPY WITH PROPOFOL;  Surgeon: [name], MD;  Location: [facility] ENDOSCOPY;  Service: Gastroenterology;  Laterality: N/A;   CORONARY ANGIOPLASTY  06/2004   s/p stent placement @ [facility]   CORONARY ARTERY BYPASS GRAFT  04-24-2004   CABG x 3 [facility]   ESOPHAGOGASTRODUODENOSCOPY (EGD) WITH PROPOFOL N/A 08/23/2020   Procedure: ESOPHAGOGASTRODUODENOSCOPY (EGD) WITH PROPOFOL;  Surgeon: [name], MD;  Location: [place];  Service: Gastroenterology;  Laterality: N/A;  Needs to be last case    Current Medications: No outpatient medications have been marked as taking for the 03/03/21 encounter (Appointment) with Rise Mu, PA-C.    Allergies:   Sulfa antibiotics and Other   Social History   Socioeconomic History   Marital status: Single    Spouse name: none   Number of children: Not on file   Years of education: high school   Highest education level: Not on file  Occupational History   Occupation: retired  Tobacco Use   Smoking status: Never   Smokeless tobacco: Never  Vaping Use   Vaping Use: Never used  Substance and Sexual Activity   Alcohol use: No   Drug use: No   Sexual activity: Not on file  Other Topics Concern   Not on file  Social History Narrative   Not on file   Social Determinants of Health   Financial Resource Strain: Not on file   Food Insecurity: No Food Insecurity   Worried About Running Out of Food in the Last Year: Never true   [place] in the Last Year: Never true  Transportation Needs: Unmet Transportation Needs   Lack of Transportation (Medical): Yes   Lack of Transportation (Non-Medical): No  Physical Activity: Unknown   Days of Exercise per Week: 0 days   Minutes of Exercise per Session: Not on file  Stress: Not on file  Social Connections: Unknown   Frequency of Communication with Friends and Family: More than three times a week   Frequency of Social Gatherings with Friends and Family: More than three times a week   Attends Religious Services: Patient refused   Marine scientist or Organizations: Patient refused   Attends Archivist Meetings: Patient refused   Marital Status: Never married     Family History:  The patient's family history includes Heart attack in his brother; Heart disease in his father and paternal uncle.  ROS:   ROS   EKGs/Labs/Other Studies Reviewed:    Studies reviewed were summarized above. The additional studies were reviewed today:  2D echo 02/02/2021: 1. Left ventricular ejection fraction, by estimation, is 55 to 60%. The  left ventricle has  normal function. The left ventricle has no regional  wall motion abnormalities. Left ventricular diastolic parameters are  consistent with Grade II diastolic  dysfunction (pseudonormalization).   2. Right ventricular systolic function is mildly reduced. The right  ventricular size is moderately enlarged.   3. Left atrial size was severely dilated.   4. Right atrial size was moderately dilated.   5. The mitral valve is degenerative. Mild mitral valve regurgitation.   6. The aortic valve is tricuspid. Aortic valve regurgitation is not  visualized. Mild aortic valve sclerosis is present, with no evidence of  aortic valve stenosis.   7. The inferior vena cava is dilated in size with <50% respiratory   variability, suggesting right atrial pressure of 15 mmHg.   Comparison(s): EF 60-65%, a fib, PASP 105 mmHg.  __________  Elwyn Reach patch 04/2020: Atrial Fibrillation occurred continuously (100% burden), ranging from 32-104 bpm (avg of 56 bpm).   2 Ventricular Tachycardia runs occurred, the run with the fastest interval lasting 7 beats with a max rate of 171 bpm (avg 117 bpm); the run with the fastest interval was also the longest.   Isolated VEs were occasional (2.6%, 28107), VE Couplets were rare (<1.0%, 393), and VE Triplets were rare (<1.0%, 14). Ventricular Bigeminy and Trigeminy were present. __________  2D echo 03/2020: 1. Left ventricular ejection fraction, by estimation, is 60 to 65%. The  left ventricle has normal function. The left ventricle demonstrates global  hypokinesis. Left ventricular diastolic parameters are indeterminate.   2. Right ventricular systolic function is mildly reduced. The right  ventricular size is mildly enlarged. There is severely elevated pulmonary  artery systolic pressure. The estimated right ventricular systolic  pressure is 628.3 mmHg.   3. Left atrial size was mildly dilated.   4. Right atrial size was mildly dilated.   5. The mitral valve is grossly normal. Mild mitral valve regurgitation.   6. The aortic valve is tricuspid. Aortic valve regurgitation is trivial.  Mild aortic valve sclerosis is present, with no evidence of aortic valve  stenosis.   7. The inferior vena cava is dilated in size with <50% respiratory  variability, suggesting right atrial pressure of 15 mmHg.  __________  2D echo 02/2019:  1. The left ventricle has hyperdynamic systolic function, with an  ejection fraction of >65%. The cavity size was normal. There is mildly  increased left ventricular wall thickness. Left ventricular diastolic  function could not be evaluated secondary to  atrial fibrillation. There is right ventricular volume and pressure  overload. No evidence of  left ventricular regional wall motion  abnormalities.   2. The right ventricle has moderately reduced systolic function. The  cavity was moderately enlarged. There is mildly increased right  ventricular wall thickness. Right ventricular systolic pressure is  severely elevated with an estimated pressure of 69.8  mmHg.   3. Left atrial size was mildly dilated.   4. Right atrial size was mildly dilated.   5. The mitral valve is degenerative. Mild thickening of the mitral valve  leaflet. There is moderate mitral annular calcification present.   6. Tricuspid valve regurgitation is mild-moderate.   7. The aortic valve is tricuspid. Mild thickening of the aortic valve.  Mild calcification of the aortic valve. Aortic valve regurgitation is  trivial by color flow Doppler.   8. The aortic root is normal in size and structure.   9. The inferior vena cava was dilated in size with <50% respiratory  variability.  ___________  LHC 04/2004 (  [facility]): Left main no angiographic stenosis, LAD with diffuse 95% proximal and mid stenosis with 20 to 30% distal stenosis, mild irregularities in the proximal LCx and OM1, 50% proximal RCA stenosis, 95% mid RCA stenosis 50% stenosis beyond the PDA along with 50% proximal PL and 90% distal PL stenosis.  No angiographically evident renal artery stenosis.  Recommendation CABG.  EKG:  EKG is ordered today.  The EKG ordered today demonstrates ***  Recent Labs: 04/03/2020: Magnesium 2.1 04/30/2020: B Natriuretic Peptide 283.6 07/18/2020: ALT 17; Platelets 180 08/08/2020: Hemoglobin 10.8 11/11/2020: BUN 44; Creatinine, Ser 1.40; Potassium 3.9; Sodium 136  Recent Lipid Panel    Component Value Date/Time   CHOL 75 03/24/2020 0434   CHOL 89 (L) 04/06/2015 1642   CHOL 150 06/06/2013 0947   TRIG 42 03/24/2020 0434   TRIG 85 06/06/2013 0947   HDL 21 (L) 03/24/2020 0434   HDL 40 04/06/2015 1642   HDL 43 06/06/2013 0947   CHOLHDL 3.6 03/24/2020 0434   VLDL 8 03/24/2020 0434    VLDL 17 06/06/2013 0947   LDLCALC 46 03/24/2020 0434   LDLCALC 36 04/06/2015 1642   LDLCALC 90 06/06/2013 0947    PHYSICAL EXAM:    VS:  There were no vitals taken for this visit.  BMI: There is no height or weight on file to calculate BMI.  Physical Exam  Wt Readings from Last 3 Encounters:  02/09/21 169 lb (76.7 kg)  01/10/21 170 lb (77.1 kg)  12/13/20 168 lb (76.2 kg)     ASSESSMENT & PLAN:   CAD status post CABG status post PCI:  Severe pulmonary hypertension/RV failure/HFpEF/pleural effusion:  Permanent A. fib:  Cirrhosis with history of ascites: Status post paracentesis in 07/2020.  Ascites appears to be related to cirrhosis though uncertain if this is related to portal hypertension or cardiogenic in etiology.  Carotid artery stenosis: Less than 39% bilateral ICA stenosis in 10/2017.  Overdue for follow-up.  HTN: Blood pressure ***  HLD: LDL 46 in 03/2020.  CKD stage III:  Anemia:  Disposition: F/u with Dr. Rockey Situ or an APP in ***.   Medication Adjustments/Labs and Tests Ordered: Current medicines are reviewed at length with the patient today.  Concerns regarding medicines are outlined above. Medication changes, Labs and Tests ordered today are summarized above and listed in the Patient Instructions accessible in Encounters.   Signed, Christell Faith, PA-C 03/02/2021 1:37 PM     [place] [address] [place], [place] [address] [phone]

## 2021-03-03 ENCOUNTER — Ambulatory Visit: Payer: Medicare HMO | Admitting: Physician Assistant

## 2021-03-06 ENCOUNTER — Emergency Department
Admission: EM | Admit: 2021-03-06 | Discharge: 2021-03-06 | Disposition: A | Discharge status: Home / Self Care | Payer: Medicare HMO | Attending: Emergency Medicine | Admitting: Emergency Medicine

## 2021-03-06 ENCOUNTER — Telehealth: Payer: Self-pay | Admitting: Family Medicine

## 2021-03-06 ENCOUNTER — Other Ambulatory Visit: Payer: Self-pay

## 2021-03-06 ENCOUNTER — Encounter: Payer: Self-pay | Admitting: Emergency Medicine

## 2021-03-06 DIAGNOSIS — Z79899 Other long term (current) drug therapy: Secondary | ICD-10-CM | POA: Insufficient documentation

## 2021-03-06 DIAGNOSIS — R21 Rash and other nonspecific skin eruption: Secondary | ICD-10-CM | POA: Diagnosis not present

## 2021-03-06 DIAGNOSIS — Z7901 Long term (current) use of anticoagulants: Secondary | ICD-10-CM | POA: Diagnosis not present

## 2021-03-06 DIAGNOSIS — I2510 Atherosclerotic heart disease of native coronary artery without angina pectoris: Secondary | ICD-10-CM | POA: Insufficient documentation

## 2021-03-06 DIAGNOSIS — I4891 Unspecified atrial fibrillation: Secondary | ICD-10-CM | POA: Insufficient documentation

## 2021-03-06 DIAGNOSIS — Z951 Presence of aortocoronary bypass graft: Secondary | ICD-10-CM | POA: Diagnosis not present

## 2021-03-06 DIAGNOSIS — N1831 Chronic kidney disease, stage 3a: Secondary | ICD-10-CM | POA: Insufficient documentation

## 2021-03-06 DIAGNOSIS — I5032 Chronic diastolic (congestive) heart failure: Secondary | ICD-10-CM | POA: Diagnosis not present

## 2021-03-06 DIAGNOSIS — Z955 Presence of coronary angioplasty implant and graft: Secondary | ICD-10-CM | POA: Diagnosis not present

## 2021-03-06 DIAGNOSIS — I130 Hypertensive heart and chronic kidney disease with heart failure and stage 1 through stage 4 chronic kidney disease, or unspecified chronic kidney disease: Secondary | ICD-10-CM | POA: Diagnosis not present

## 2021-03-06 LAB — CBC WITH DIFFERENTIAL/PLATELET
Abs Immature Granulocytes: 0.05 10*3/uL (ref 0.00–0.07)
Basophils Absolute: 0 10*3/uL (ref 0.0–0.1)
Basophils Relative: 1 %
Eosinophils Absolute: 0.1 10*3/uL (ref 0.0–0.5)
Eosinophils Relative: 2 %
HCT: 28 % — ABNORMAL LOW (ref 39.0–52.0)
Hemoglobin: 9.9 g/dL — ABNORMAL LOW (ref 13.0–17.0)
Immature Granulocytes: 1 %
Lymphocytes Relative: 13 %
Lymphs Abs: 0.9 10*3/uL (ref 0.7–4.0)
MCH: 34 pg (ref 26.0–34.0)
MCHC: 35.4 g/dL (ref 30.0–36.0)
MCV: 96.2 fL (ref 80.0–100.0)
Monocytes Absolute: 0.6 10*3/uL (ref 0.1–1.0)
Monocytes Relative: 9 %
Neutro Abs: 5.6 10*3/uL (ref 1.7–7.7)
Neutrophils Relative %: 74 %
Platelets: 203 10*3/uL (ref 150–400)
RBC: 2.91 MIL/uL — ABNORMAL LOW (ref 4.22–5.81)
RDW: 14.2 % (ref 11.5–15.5)
WBC: 7.4 10*3/uL (ref 4.0–10.5)
nRBC: 0 % (ref 0.0–0.2)

## 2021-03-06 LAB — COMPREHENSIVE METABOLIC PANEL
ALT: 18 U/L (ref 0–44)
AST: 28 U/L (ref 15–41)
Albumin: 3.4 g/dL — ABNORMAL LOW (ref 3.5–5.0)
Alkaline Phosphatase: 106 U/L (ref 38–126)
Anion gap: 7 (ref 5–15)
BUN: 21 mg/dL (ref 8–23)
CO2: 26 mmol/L (ref 22–32)
Calcium: 9.1 mg/dL (ref 8.9–10.3)
Chloride: 93 mmol/L — ABNORMAL LOW (ref 98–111)
Creatinine, Ser: 0.89 mg/dL (ref 0.61–1.24)
GFR, Estimated: >60 mL/min (ref >60)
Glucose, Bld: 107 mg/dL — ABNORMAL HIGH (ref 70–99)
Potassium: 4.5 mmol/L (ref 3.5–5.1)
Sodium: 126 mmol/L — ABNORMAL LOW (ref 135–145)
Total Bilirubin: 1.7 mg/dL — ABNORMAL HIGH (ref 0.3–1.2)
Total Protein: 8.5 g/dL — ABNORMAL HIGH (ref 6.5–8.1)

## 2021-03-06 MED ORDER — CEPHALEXIN 500 MG PO CAPS: 500.0000 mg | ORAL_CAPSULE | Freq: Two times a day (BID) | ORAL | 0 refills | Status: AC

## 2021-03-06 NOTE — ED Provider Notes (Signed)
[facility] Emergency Department Provider Note  ____________________________________________   Event Date/Time   First MD Initiated Contact with Patient 03/06/21 1046     (approximate)  I have reviewed the triage vital signs and the nursing notes.   HISTORY  Chief Complaint Rash   HPI Jordan Perkins is a 80 y.o. male with CHF, atrial fibrillation, liver disease who comes in with rash.  Patient has a red rash in the bends of his extremities.  Mostly noted inside his right elbow, left armpit, neck folds.  He states its been there for weeks, nothing makes better, nothing makes it worse.  His landlord states that he just noticed it a few days ago.  They report that it is itchy, nothing makes it better, nothing makes it worse.  Denies any new medication changes.  Otherwise he states that he feels fine.  Denies any fevers, nausea, vomiting, chest pain, shortness of          Past Medical History:  Diagnosis Date   (HFpEF) heart failure with preserved ejection fraction (HCC)    CHF (congestive heart failure) ([place])    Coronary artery disease    Hyperlipidemia    Hypertension    PAH (pulmonary artery hypertension) (HCC)    Permanent atrial fibrillation (HCC)    Pleural effusion     Patient Active Problem List   Diagnosis Date Noted   History of cirrhosis of liver    Ankle pain 07/01/2020   Hyposmolality and/or hyponatremia 05/13/2020   Chronic kidney disease 05/11/2020   Protein-calorie malnutrition, severe 05/04/2020   Goals of care, counseling/discussion    Palliative care by specialist    DNR (do not resuscitate)    Occult blood positive stool 04/15/2020   Chronic diastolic CHF (congestive heart failure) ([place]) 03/23/2020   Coronary artery disease    Fall    Hypokalemia    Bradycardia    Stage 3a chronic kidney disease ([place]) 07/12/2019   Prediabetes 06/12/2019   Macrocytic anemia 03/10/2019   Pulmonary hypertension, unspecified (HCC)     Pleural effusion    CHF (congestive heart failure) ([place]) 02/16/2019   PAD (peripheral artery disease) ([place]) 01/02/2019   Bilateral carotid artery disease ([place]) 01/02/2019   Memory difficulty 11/05/2016   Seborrheic keratoses 11/05/2016   Hyponatremia 10/29/2016   Encounter for anticoagulation discussion and counseling 07/21/2014   CAD (coronary artery disease) 06/23/2013   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   Atrial fibrillation ([place]) 06/23/2013   Hyperlipidemia 06/23/2013   Essential hypertension 06/23/2013   Gallstone pancreatitis 06/23/2013    Past Surgical History:  Procedure Laterality Date   CARDIAC CATHETERIZATION     CHOLECYSTECTOMY     COLONOSCOPY WITH PROPOFOL N/A 08/23/2020   Procedure: COLONOSCOPY WITH PROPOFOL;  Surgeon: [name], MD;  Location: [facility] ENDOSCOPY;  Service: Gastroenterology;  Laterality: N/A;   CORONARY ANGIOPLASTY  06/2004   s/p stent placement @ [facility]   CORONARY ARTERY BYPASS GRAFT  04-24-2004   CABG x 3 [facility]   ESOPHAGOGASTRODUODENOSCOPY (EGD) WITH PROPOFOL N/A 08/23/2020   Procedure: ESOPHAGOGASTRODUODENOSCOPY (EGD) WITH PROPOFOL;  Surgeon: [name], MD;  Location: [place];  Service: Gastroenterology;  Laterality: N/A;  Needs to be last case    Prior to Admission medications   Medication Sig Start Date End Date Taking? Authorizing Provider  atorvastatin (LIPITOR) 80 MG tablet TAKE 1 TABLET BY MOUTH EVERY DAY 12/26/20   [name], MD  ferrous sulfate 325 (65 FE)  MG tablet TAKE 1 TABLET BY MOUTH EVERY DAY 12/13/20   [name], MD  pantoprazole (PROTONIX) 40 MG tablet Take 1 tablet (40 mg total) by mouth daily. 07/08/20   Loel Dubonnet, NP  rivaroxaban (XARELTO) 20 MG TABS tablet Take 1 tablet (20 mg total) by mouth daily with supper. 08/24/20   Loel Dubonnet, NP  sildenafil (REVATIO) 20 MG tablet Take 1 tablet (20 mg total) by mouth 3 (three) times daily. 12/06/20   [name], MD   spironolactone (ALDACTONE) 25 MG tablet Take 1 tablet (25 mg total) by mouth daily. 07/04/20   [name], MD  torsemide (DEMADEX) 20 MG tablet Take 1 tablet (20 mg total) by mouth daily. 07/25/20   Loel Dubonnet, NP    Allergies Sulfa antibiotics and Other  Family History  Problem Relation Age of Onset   Heart disease Father    Heart disease Paternal Uncle    Heart attack Brother     Social History Social History   Tobacco Use   Smoking status: Never   Smokeless tobacco: Never  Vaping Use   Vaping Use: Never used  Substance Use Topics   Alcohol use: No   Drug use: No      Review of Systems Constitutional: No fever/chills Eyes: No visual changes. ENT: No sore throat. Cardiovascular: Denies chest pain. Respiratory: Denies shortness of breath. Gastrointestinal: No abdominal pain.  No nausea, no vomiting.  No diarrhea.  No constipation. Genitourinary: Negative for dysuria. Musculoskeletal: Negative for back pain. Skin: Positive rash Neurological: Negative for headaches, focal weakness or numbness. All other ROS negative ____________________________________________   PHYSICAL EXAM:  VITAL SIGNS: ED Triage Vitals [03/06/21 1009]  Enc Vitals Group     BP (!) 146/78     Pulse Rate 87     Resp 16     Temp 98.4 F (36.9 C)     Temp Source Oral     SpO2 99 %     Weight 169 lb 1.5 oz (76.7 kg)     Height 5\' 11"  (1.803 m)     Head Circumference      Peak Flow      Pain Score 0     Pain Loc      Pain Edu?      Excl. in [place]?     Constitutional: Alert and oriented. Well appearing and in no acute distress. Eyes: Conjunctivae are normal. EOMI. Head: Atraumatic. Nose: No congestion/rhinnorhea. Mouth/Throat: Mucous membranes are moist.   NO OP lesions  Neck: No stridor. Trachea Midline. FROM Cardiovascular: Normal rate, regular rhythm. Grossly normal heart sounds.  Good peripheral circulation. Respiratory: Normal respiratory effort.  No retractions.  Lungs CTAB. Gastrointestinal: Soft and nontender. No distention. No abdominal bruits.  Musculoskeletal: No lower extremity tenderness nor edema.  No joint effusions. Neurologic:  Normal speech and language. No gross focal neurologic deficits are appreciated.  Skin: Patchy, scaly rash noted in between the folds of his skin in the right elbow, left armpit and underneath the neck.  Slightly erythematous. Non blanching. No pustules  Psychiatric: Mood and affect are normal. Speech and behavior are normal. GU: Deferred   ____________________________________________   LABS (all labs ordered are listed, but only abnormal results are displayed)  Labs Reviewed  CBC WITH DIFFERENTIAL/PLATELET - Abnormal; Notable for the following components:      Result Value   RBC 2.91 (*)    Hemoglobin 9.9 (*)    HCT 28.0 (*)  All other components within normal limits  COMPREHENSIVE METABOLIC PANEL - Abnormal; Notable for the following components:   Sodium 126 (*)    Chloride 93 (*)    Glucose, Bld 107 (*)    Total Protein 8.5 (*)    Albumin 3.4 (*)    Total Bilirubin 1.7 (*)    All other components within normal limits   ____________________________________________   PROCEDURES  Procedure(s) performed (including Critical Care):  Procedures   ____________________________________________   INITIAL IMPRESSION / ASSESSMENT AND PLAN / ED COURSE  Jordan Perkins was evaluated in Emergency Department on 03/06/2021 for the symptoms described in the history of present illness. He was evaluated in the context of the global COVID-19 pandemic, which necessitated consideration that the patient might be at risk for infection with the SARS-CoV-2 virus that causes COVID-19. Institutional protocols and algorithms that pertain to the evaluation of patients at risk for COVID-19 are in a state of rapid change based on information released by regulatory bodies including the CDC and federal and state  organizations. These policies and algorithms were followed during the patient's care in the ED.    Patient is a 80 year old who comes in with a rash.  The rashes in the folds of his armpit, elbow, neck folds.  I suspect that it some of this redness is just from the itching but they are red in nature and there could be some mild underlying infection associated with them.  Given patient's age I think it be best to start him on a antibiotic for this.  However given his history of cirrhosis I did get some labs just to make sure his T bili wasn't significantly elevated this was causing him worsening itching.  His T bili was at 1.7 which is around his baseline.  Incidentally noted his sodium was a little low at 126.  Patient reports having a history of this previously that he is told to just increase a little bit of salt in his diet.  He does not look fluid overloaded on exam.  I did encourage a little bit of salt and to have this rechecked with his primary care doctor.  He felt comfortable with this plan.  He is very well-appearing does not septic appearing.  White count is normal.  Hemoglobin at baseline. I did review his records he has no history of MRSA and he is allergic to Bactrim.  I would worry about the clindamycin causing C. difficile we will start with a course of Keflex   We discussed a short course of antibiotics and using calamine lotion to help keep the areas moist and to help prevent the itching.  He is going to follow-up with dermatology if these areas are not getting better        ____________________________________________   FINAL CLINICAL IMPRESSION(S) / ED DIAGNOSES   Final diagnoses:  Rash      MEDICATIONS GIVEN DURING THIS VISIT:  Medications - No data to display   ED Discharge Orders          Ordered    cephALEXin (KEFLEX) 500 MG capsule  2 times daily        03/06/21 1231             Note:  This document was prepared using Dragon voice recognition  software and may include unintentional dictation errors.    Vanessa [place], MD 03/06/21 [phone]

## 2021-03-06 NOTE — Telephone Encounter (Signed)
I called and spoke with the patients niece and she is taking him to the ER.  Terin Dierolf,cma

## 2021-03-06 NOTE — Discharge Instructions (Addendum)
It is possible that some of the redness is just from the itching and I think he should start putting calamine lotion over these areas to help with the itching and with the dryness.  He should use the antibiotics to see if this helps improvement.  If the rash is getting worse develops fever he can return to the ER immediately.  Otherwise he can call dermatology for follow-up for follow-up with his PCP.  His sodium was also slightly low needs follow-up with his PCP

## 2021-03-06 NOTE — ED Triage Notes (Signed)
C/O staph infection to right arm, left arm and chin x 5-6 months.  Reddened areas noted to right and left antecubital areas and left chin.  No drainage seen.  Patient c/o itching to area.

## 2021-03-06 NOTE — Telephone Encounter (Signed)
Patient's niece, Hilda Blades called and stated patient has a severe rash on his arms and face, it hurts and is itchy. No available appointments at this time. Call was transferred to [facility] at [facility].

## 2021-03-06 NOTE — ED Notes (Signed)
See triage note    States he developed an area to right arm about 1 week ago  Developed itching   Now has open area with some drainage

## 2021-03-08 ENCOUNTER — Other Ambulatory Visit: Payer: Self-pay

## 2021-03-08 ENCOUNTER — Encounter: Payer: Self-pay | Admitting: Family Medicine

## 2021-03-08 ENCOUNTER — Ambulatory Visit (INDEPENDENT_AMBULATORY_CARE_PROVIDER_SITE_OTHER): Payer: Medicare HMO | Admitting: Family Medicine

## 2021-03-08 DIAGNOSIS — R21 Rash and other nonspecific skin eruption: Secondary | ICD-10-CM | POA: Insufficient documentation

## 2021-03-08 DIAGNOSIS — E871 Hypo-osmolality and hyponatremia: Secondary | ICD-10-CM

## 2021-03-08 DIAGNOSIS — M25579 Pain in unspecified ankle and joints of unspecified foot: Secondary | ICD-10-CM

## 2021-03-08 DIAGNOSIS — Z8719 Personal history of other diseases of the digestive system: Secondary | ICD-10-CM

## 2021-03-08 LAB — HEPATIC FUNCTION PANEL
ALT: 14 U/L (ref 0–53)
AST: 22 U/L (ref 0–37)
Albumin: 3.6 g/dL (ref 3.5–5.2)
Alkaline Phosphatase: 103 U/L (ref 39–117)
Bilirubin, Direct: 0.4 mg/dL — ABNORMAL HIGH (ref 0.0–0.3)
Total Bilirubin: 1.3 mg/dL — ABNORMAL HIGH (ref 0.2–1.2)
Total Protein: 7.8 g/dL (ref 6.0–8.3)

## 2021-03-08 LAB — BASIC METABOLIC PANEL
BUN: 26 mg/dL — ABNORMAL HIGH (ref 6–23)
CO2: 23 mEq/L (ref 19–32)
Calcium: 9.2 mg/dL (ref 8.4–10.5)
Chloride: 98 mEq/L (ref 96–112)
Creatinine, Ser: 1.03 mg/dL (ref 0.40–1.50)
GFR: 70.01 mL/min (ref >60.00)
Glucose, Bld: 89 mg/dL (ref 70–99)
Potassium: 4.6 mEq/L (ref 3.5–5.1)
Sodium: 131 mEq/L — ABNORMAL LOW (ref 135–145)

## 2021-03-08 NOTE — Progress Notes (Signed)
Jordan Rumps, MD Phone: [phone]  Kennedy Bohanon Vandyne is a 80 y.o. male who presents today for f/u.  Rash: Patient noted this has been going on for few weeks.  It is in his antecubital fossa's bilaterally.  He also developed it on his chin.  Notes it was red and itchy.  He was seen in the emergency room and they treated him with Keflex.  He notes the rash has been improving.  He has been using calamine on it.  No poison ivy contact.  No changes to medicines or soaps.  He has not been using a topical steroid.  Hyponatremia: This is a chronic issue.  Noted to be low in the ED.  No nausea, vomiting, confusion, balance issues, or headaches.  Elevated bilirubin: Needs to be followed up on from the emergency department.  Bilateral ankle pain: This is an ongoing issue.  He saw podiatry 3 months ago and notes they advised him it was arthritis.  His ankles seem to hurt intermittently. He takes tylenol mostly just at night to help with the pain.  Social History   Tobacco Use  Smoking Status Never  Smokeless Tobacco Never    Current Outpatient Medications on File Prior to Visit  Medication Sig Dispense Refill   atorvastatin (LIPITOR) 80 MG tablet TAKE 1 TABLET BY MOUTH EVERY DAY 90 tablet 0   cephALEXin (KEFLEX) 500 MG capsule Take 1 capsule (500 mg total) by mouth 2 (two) times daily for 5 days. 10 capsule 0   ferrous sulfate 325 (65 FE) MG tablet TAKE 1 TABLET BY MOUTH EVERY DAY 90 tablet 1   furosemide (LASIX) 20 MG tablet Take 20 mg by mouth daily.     pantoprazole (PROTONIX) 40 MG tablet Take 1 tablet (40 mg total) by mouth daily. 60 tablet 11   rivaroxaban (XARELTO) 20 MG TABS tablet Take 1 tablet (20 mg total) by mouth daily with supper. 30 tablet 5   sildenafil (REVATIO) 20 MG tablet Take 1 tablet (20 mg total) by mouth 3 (three) times daily. 90 tablet 7   spironolactone (ALDACTONE) 25 MG tablet Take 1 tablet (25 mg total) by mouth daily. 90 tablet 3   torsemide (DEMADEX) 20 MG  tablet Take 1 tablet (20 mg total) by mouth daily. 90 tablet 3   No current facility-administered medications on file prior to visit.     ROS see history of present illness  Objective  Physical Exam Vitals:   03/08/21 0824 03/08/21 0902  BP: 130/80   Pulse: (!) 48 (!) 55  Temp: (!) 97.5 F (36.4 C)   SpO2: 99%     BP Readings from Last 3 Encounters:  03/08/21 130/80  03/06/21 (!) 146/78  02/09/21 (!) 132/54   Wt Readings from Last 3 Encounters:  03/08/21 164 lb 6.4 oz (74.6 kg)  03/06/21 169 lb 1.5 oz (76.7 kg)  02/09/21 169 lb (76.7 kg)    Physical Exam Constitutional:      General: He is not in acute distress.    Appearance: He is not diaphoretic.  Cardiovascular:     Rate and Rhythm: Normal rate. Rhythm irregularly irregular.     Heart sounds: Normal heart sounds.  Pulmonary:     Effort: Pulmonary effort is normal.     Breath sounds: Normal breath sounds.  Musculoskeletal:     Right lower leg: No edema.     Left lower leg: No edema.     Comments: Bilateral ankles with no swelling, there is  slight tenderness  Skin:    General: Skin is warm and dry.  Neurological:     Mental Status: He is alert.     Assessment/Plan: Please see individual problem list.  Problem List Items Addressed This Visit     Ankle pain    Chronic issue.  Suspect this is related to arthritis.  He can continue use of Tylenol up to 1000 mg twice daily as needed for pain.       History of cirrhosis of liver    Plan to recheck bilirubin and hepatic function panel.       Relevant Orders   Hepatic function panel   Hyponatremia    Needs to be rechecked.  He is asymptomatic.  Advised if he develops symptoms of nausea, vomiting, confusion, balance issues, or headaches he needs to be evaluated emergency department.       Relevant Orders   Basic Metabolic Panel (BMET)   Rash    Undetermined cause.  Certainly there could have been some measure of infection given that this has been  improving with use of Keflex.  He will finish his course of Keflex 500 mg twice daily.  If the rash worsens or recurs at any point he will let us know and we can have him see dermatology.        Return in about 3 months (around 06/08/2021).  This visit occurred during the SARS-CoV-2 public health emergency.  Safety protocols were in place, including screening questions prior to the visit, additional usage of staff PPE, and extensive cleaning of exam room while observing appropriate contact time as indicated for disinfecting solutions.    Jordan Rumps, MD [place]

## 2021-03-08 NOTE — Patient Instructions (Signed)
Nice to see you. We will get lab work today. Please monitor your rash and if it does not continue to improve please let me know.

## 2021-03-08 NOTE — Assessment & Plan Note (Signed)
Chronic issue.  Suspect this is related to arthritis.  He can continue use of Tylenol up to 1000 mg twice daily as needed for pain.

## 2021-03-08 NOTE — Assessment & Plan Note (Signed)
Plan to recheck bilirubin and hepatic function panel.

## 2021-03-08 NOTE — Assessment & Plan Note (Signed)
Needs to be rechecked.  He is asymptomatic.  Advised if he develops symptoms of nausea, vomiting, confusion, balance issues, or headaches he needs to be evaluated emergency department.

## 2021-03-08 NOTE — Assessment & Plan Note (Signed)
Undetermined cause.  Certainly there could have been some measure of infection given that this has been improving with use of Keflex.  He will finish his course of Keflex 500 mg twice daily.  If the rash worsens or recurs at any point he will let us know and we can have him see dermatology.

## 2021-03-14 ENCOUNTER — Ambulatory Visit: Payer: Medicare HMO | Admitting: Cardiovascular Disease

## 2021-03-16 ENCOUNTER — Ambulatory Visit: Payer: Medicare HMO | Admitting: Podiatry

## 2021-03-16 ENCOUNTER — Other Ambulatory Visit: Payer: Medicare HMO

## 2021-03-16 ENCOUNTER — Other Ambulatory Visit: Payer: Self-pay

## 2021-03-16 VITALS — BP 134/56 | HR 60 | Temp 98.7°F | Resp 20 | Wt 165.0 lb

## 2021-03-16 DIAGNOSIS — Z515 Encounter for palliative care: Secondary | ICD-10-CM

## 2021-03-16 NOTE — Progress Notes (Signed)
PATIENT NAME: Jordan Perkins DOB: 23-Feb-1941 MRN: [id number]  PRIMARY CARE PROVIDER: [name], MD  RESPONSIBLE PARTY:  Acct ID - Guarantor Home Phone Work Phone Relationship Acct Type  [id number] - [phone]  Self P/F     [place], [place], [place] [id number]    PLAN OF CARE and INTERVENTIONS:               1.  GOALS OF CARE/ ADVANCE CARE PLANNING:  Remain home and independent with the assistance of his brother.                2.  PATIENT/CAREGIVER EDUCATION:  Sodium level and salt intake.               4. PERSONAL EMERGENCY PLAN:  Activate 911 for emergencies.               5.  DISEASE STATUS:  Joint visit completed with [place], SW and patient.  Patient is found in the recliner chair.  Continues to report knee and feet pain.  Taking prn tylenol about 1 x daily.  Patient was seen in the ED last month due to a rash on his arms. Antibiotics started.  Area has since resolved and dryness is present.  Encouraged patient to continue using lotion for dryness.    Follow up visit completed with PCP and recheck of blood work.  Na+ up to 131.  Patient continues to add salt to food to keep Na+ up.  This is a chronic issue for patient.  He denies any issues with headaches, nausea, vomiting or confusion.   Appetite remains good.  MOW providing lunches, patient is preparing his own breakfast and occasionally will cook dinner.  Family continues to bring meals in on the weekend.   Weight loss of 4 lb noted since my last visit.  BMI 23.01.   Patient continues to use a cane or walker for ambulation.  Denies any falls.  Sleeping well.  No issues with insomnia.  Managing medications well.  Refills ready and neighbor will pick up today.   Regular bowel movements with most recent yesterday.  No abdominal tenderness present.  Small amount of ascites present.    HISTORY OF PRESENT ILLNESS:  80 year old male with hx of CHF and Cirrhosis of the liver.  Patient is being followed by  Palliative Care monthly and PRN.  CODE STATUS: Full ADVANCED DIRECTIVES: Y MOST FORM: No PPS: 50%   PHYSICAL EXAM:   VITALS: Today's Vitals   03/16/21 0912  BP: (!) 134/56  Pulse: 60  Resp: 20  Temp: 98.7 F (37.1 C)  SpO2: 96%  Weight: 165 lb (74.8 kg)    LUNGS: negative findings: lungs clear to auscultation CARDIAC: Cor RRR} PVC EXTREMITIES: - for edema SKIN: Skin color, texture, turgor normal. No rashes or lesions or dryness   NEURO: Balance issues-using a cane for ambulation.       Lorenza Burton, RN

## 2021-03-16 NOTE — Progress Notes (Signed)
COMMUNITY PALLIATIVE CARE SW NOTE  PATIENT NAME: Jordan Perkins DOB: Feb 15, 1941 MRN: [id number]  PRIMARY CARE PROVIDER: [name], MD  RESPONSIBLE PARTY:  Acct ID - Guarantor Home Phone Work Phone Relationship Acct Type  [id number] - [phone]  Self P/F     [place], [place], [place] [address]     PLAN OF CARE and INTERVENTIONS:             GOALS OF CARE/ ADVANCE CARE PLANNING:  Patient is now a FULL CODE.Marland Kitchen Patient's goal is to remain at home with brother.   2.         SOCIAL/EMOTIONAL/SPIRITUAL ASSESSMENT/ INTERVENTIONS:  SW and RN Almyra Free met with patient in patients home for follow up visit. Patient lives in a single story mobile home with his brother. Patient updated SW an Therapist, sports on medical condition.   Patient reports some pain in ankles and legs this morning and while walking and patient takes Tylenol, patient also keeps his feet elevated. Patients skin to bilateral ankles are extremely dry. Patient scheduled to see podiatry today at 1:45 PM. Patient had ED visit 6/27 for rash to left arm, Keflex ABT prescribed along with calamine. Patient will be referred to dermatology should rash get worse. Patients sodium levels was low, rechecked at PCP office 6/29, numbers did improve.    Patient shares that he sleeps well and naps during the day. No loss in appetite, patient preps and cooks his own meals. No falls reported. Patient uses [facility] for ambulation. Patient has RW if needed.   RN reviewed medications and took vitals. Patient encouraged to weigh himself daily. Current weight 165lb. Patient scheduled to see Dr. Rockey Situ, cardiology later this month 7/27. Patient anticiapted to f/u with PCP in 3 months.    SW discussed goals, reviewed care plan, provided emotional support, used active and reflective listening. No psychosocial needs at this time. Patients family and landlord assist with meal and transportation, and has the ability to use local transit if needed. Patient  also receives MOW's. Palliative care will continue to monitor and assist with long term care planning as needed.   3.         PATIENT/CAREGIVER EDUCATION/ COPING:  Patient A&O x3, Pt able to answer all questions appropriately. Patient has normal motor skills. Patient has good reasoning ability. No S/S of depression or anxiety. Patients family is supportive. Brother resides with him and niece lives close by and checks on him often. Patient shares that he enjoyed playing the banjo and guitar.   4.         PERSONAL EMERGENCY PLAN:  Patient will call 9-1-1 for emergencies.   5.         COMMUNITY RESOURCES COORDINATION/ HEALTH CARE NAVIGATION:  Patient manages care.   6.         FINANCIAL/LEGAL CONCERNS/INTERVENTIONS:  None.     SOCIAL HX:  Social History   Tobacco Use   Smoking status: Never   Smokeless tobacco: Never  Substance Use Topics   Alcohol use: No    CODE STATUS: FULL CODE  ADVANCED DIRECTIVES: Y MOST FORM COMPLETE:  N HOSPICE EDUCATION PROVIDED: N  PPS: Patient is independent with ADL's and cooking.    Time spent: 40 min       [place] Henrene Pastor, [place]

## 2021-03-23 ENCOUNTER — Other Ambulatory Visit: Payer: Self-pay | Admitting: Cardiovascular Disease

## 2021-03-24 ENCOUNTER — Other Ambulatory Visit: Payer: Self-pay | Admitting: *Deleted

## 2021-03-24 NOTE — Patient Outreach (Signed)
[place], [place] [facility]) Care Management  03/24/2021  Jordan Perkins Jun 27, 1941 [id number]   RN Health Coach attempted follow up outreach call to patient.  Patient was unavailable. HIPPA compliance voicemail message left with return callback number.  Plan: RN will call patient again within 30 days.  [facility] Care Management [phone]

## 2021-03-29 ENCOUNTER — Encounter: Payer: Self-pay | Admitting: Family Medicine

## 2021-03-29 ENCOUNTER — Emergency Department: Payer: Medicare HMO

## 2021-03-29 ENCOUNTER — Inpatient Hospital Stay: Payer: Medicare HMO

## 2021-03-29 ENCOUNTER — Observation Stay
Admission: EM | Admit: 2021-03-29 | Discharge: 2021-03-30 | Disposition: A | Discharge status: Home / Self Care | Payer: Medicare HMO | Attending: Hospitalist | Admitting: Hospitalist

## 2021-03-29 ENCOUNTER — Other Ambulatory Visit: Payer: Self-pay

## 2021-03-29 DIAGNOSIS — I4891 Unspecified atrial fibrillation: Secondary | ICD-10-CM | POA: Insufficient documentation

## 2021-03-29 DIAGNOSIS — R531 Weakness: Secondary | ICD-10-CM | POA: Insufficient documentation

## 2021-03-29 DIAGNOSIS — E871 Hypo-osmolality and hyponatremia: Secondary | ICD-10-CM | POA: Diagnosis not present

## 2021-03-29 DIAGNOSIS — N1831 Chronic kidney disease, stage 3a: Secondary | ICD-10-CM | POA: Diagnosis not present

## 2021-03-29 DIAGNOSIS — I10 Essential (primary) hypertension: Secondary | ICD-10-CM | POA: Diagnosis not present

## 2021-03-29 DIAGNOSIS — Z79899 Other long term (current) drug therapy: Secondary | ICD-10-CM | POA: Diagnosis not present

## 2021-03-29 DIAGNOSIS — R079 Chest pain, unspecified: Secondary | ICD-10-CM | POA: Insufficient documentation

## 2021-03-29 DIAGNOSIS — I5032 Chronic diastolic (congestive) heart failure: Secondary | ICD-10-CM | POA: Diagnosis not present

## 2021-03-29 DIAGNOSIS — R2681 Unsteadiness on feet: Secondary | ICD-10-CM | POA: Diagnosis not present

## 2021-03-29 DIAGNOSIS — Z20822 Contact with and (suspected) exposure to covid-19: Secondary | ICD-10-CM | POA: Insufficient documentation

## 2021-03-29 DIAGNOSIS — Z743 Need for continuous supervision: Secondary | ICD-10-CM | POA: Diagnosis not present

## 2021-03-29 DIAGNOSIS — I999 Unspecified disorder of circulatory system: Secondary | ICD-10-CM | POA: Diagnosis not present

## 2021-03-29 DIAGNOSIS — I288 Other diseases of pulmonary vessels: Secondary | ICD-10-CM | POA: Diagnosis not present

## 2021-03-29 DIAGNOSIS — J90 Pleural effusion, not elsewhere classified: Secondary | ICD-10-CM | POA: Diagnosis not present

## 2021-03-29 DIAGNOSIS — K921 Melena: Secondary | ICD-10-CM | POA: Diagnosis not present

## 2021-03-29 DIAGNOSIS — R109 Unspecified abdominal pain: Secondary | ICD-10-CM | POA: Diagnosis not present

## 2021-03-29 DIAGNOSIS — I2510 Atherosclerotic heart disease of native coronary artery without angina pectoris: Secondary | ICD-10-CM | POA: Diagnosis not present

## 2021-03-29 DIAGNOSIS — N3289 Other specified disorders of bladder: Secondary | ICD-10-CM | POA: Diagnosis not present

## 2021-03-29 DIAGNOSIS — R1011 Right upper quadrant pain: Secondary | ICD-10-CM | POA: Diagnosis not present

## 2021-03-29 DIAGNOSIS — K6389 Other specified diseases of intestine: Secondary | ICD-10-CM | POA: Diagnosis not present

## 2021-03-29 DIAGNOSIS — I130 Hypertensive heart and chronic kidney disease with heart failure and stage 1 through stage 4 chronic kidney disease, or unspecified chronic kidney disease: Secondary | ICD-10-CM | POA: Diagnosis not present

## 2021-03-29 DIAGNOSIS — I517 Cardiomegaly: Secondary | ICD-10-CM | POA: Diagnosis not present

## 2021-03-29 DIAGNOSIS — R110 Nausea: Secondary | ICD-10-CM | POA: Diagnosis not present

## 2021-03-29 DIAGNOSIS — Z951 Presence of aortocoronary bypass graft: Secondary | ICD-10-CM | POA: Insufficient documentation

## 2021-03-29 DIAGNOSIS — J9811 Atelectasis: Secondary | ICD-10-CM | POA: Diagnosis not present

## 2021-03-29 DIAGNOSIS — R1084 Generalized abdominal pain: Secondary | ICD-10-CM | POA: Diagnosis not present

## 2021-03-29 DIAGNOSIS — I739 Peripheral vascular disease, unspecified: Secondary | ICD-10-CM | POA: Insufficient documentation

## 2021-03-29 DIAGNOSIS — K7460 Unspecified cirrhosis of liver: Secondary | ICD-10-CM | POA: Diagnosis not present

## 2021-03-29 DIAGNOSIS — J811 Chronic pulmonary edema: Secondary | ICD-10-CM | POA: Diagnosis not present

## 2021-03-29 DIAGNOSIS — N281 Cyst of kidney, acquired: Secondary | ICD-10-CM | POA: Diagnosis not present

## 2021-03-29 DIAGNOSIS — N400 Enlarged prostate without lower urinary tract symptoms: Secondary | ICD-10-CM | POA: Diagnosis not present

## 2021-03-29 LAB — BASIC METABOLIC PANEL
Anion gap: 11 (ref 5–15)
Anion gap: 9 (ref 5–15)
BUN: 19 mg/dL (ref 8–23)
BUN: 21 mg/dL (ref 8–23)
CO2: 26 mmol/L (ref 22–32)
CO2: 26 mmol/L (ref 22–32)
Calcium: 9.2 mg/dL (ref 8.9–10.3)
Calcium: 8.9 mg/dL (ref 8.9–10.3)
Chloride: 83 mmol/L — ABNORMAL LOW (ref 98–111)
Chloride: 90 mmol/L — ABNORMAL LOW (ref 98–111)
Creatinine, Ser: 0.95 mg/dL (ref 0.61–1.24)
Creatinine, Ser: 1.17 mg/dL (ref 0.61–1.24)
GFR, Estimated: >60 mL/min (ref >60)
GFR, Estimated: >60 mL/min (ref >60)
Glucose, Bld: 102 mg/dL — ABNORMAL HIGH (ref 70–99)
Glucose, Bld: 129 mg/dL — ABNORMAL HIGH (ref 70–99)
Potassium: 3.8 mmol/L (ref 3.5–5.1)
Potassium: 3.9 mmol/L (ref 3.5–5.1)
Sodium: 120 mmol/L — ABNORMAL LOW (ref 135–145)
Sodium: 125 mmol/L — ABNORMAL LOW (ref 135–145)

## 2021-03-29 LAB — CBC
HCT: 29.3 % — ABNORMAL LOW (ref 39.0–52.0)
Hemoglobin: 10.4 g/dL — ABNORMAL LOW (ref 13.0–17.0)
MCH: 33.7 pg (ref 26.0–34.0)
MCHC: 35.5 g/dL (ref 30.0–36.0)
MCV: 94.8 fL (ref 80.0–100.0)
Platelets: 184 10*3/uL (ref 150–400)
RBC: 3.09 MIL/uL — ABNORMAL LOW (ref 4.22–5.81)
RDW: 13.3 % (ref 11.5–15.5)
WBC: 6.1 10*3/uL (ref 4.0–10.5)
nRBC: 0 % (ref 0.0–0.2)

## 2021-03-29 LAB — LIPASE, BLOOD: Lipase: 52 U/L — ABNORMAL HIGH (ref 11–51)

## 2021-03-29 LAB — TROPONIN I (HIGH SENSITIVITY)
Troponin I (High Sensitivity): 13 ng/L (ref <18)
Troponin I (High Sensitivity): 13 ng/L (ref <18)

## 2021-03-29 LAB — OSMOLALITY, URINE: Osmolality, Ur: 161 mOsm/kg — ABNORMAL LOW (ref 300–900)

## 2021-03-29 LAB — SODIUM, URINE, RANDOM: Sodium, Ur: 30 mmol/L

## 2021-03-29 LAB — HEPATIC FUNCTION PANEL
ALT: 17 U/L (ref 0–44)
AST: 27 U/L (ref 15–41)
Albumin: 3.4 g/dL — ABNORMAL LOW (ref 3.5–5.0)
Alkaline Phosphatase: 101 U/L (ref 38–126)
Bilirubin, Direct: 0.4 mg/dL — ABNORMAL HIGH (ref 0.0–0.2)
Indirect Bilirubin: 1.9 mg/dL — ABNORMAL HIGH (ref 0.3–0.9)
Total Bilirubin: 2.3 mg/dL — ABNORMAL HIGH (ref 0.3–1.2)
Total Protein: 8.3 g/dL — ABNORMAL HIGH (ref 6.5–8.1)

## 2021-03-29 LAB — CREATININE, URINE, RANDOM: Creatinine, Urine: 20 mg/dL

## 2021-03-29 LAB — RESP PANEL BY RT-PCR (FLU A&B, COVID) ARPGX2
Influenza A by PCR: NEGATIVE
Influenza B by PCR: NEGATIVE
SARS Coronavirus 2 by RT PCR: NEGATIVE

## 2021-03-29 LAB — OSMOLALITY: Osmolality: 264 mOsm/kg — ABNORMAL LOW (ref 275–295)

## 2021-03-29 MED ORDER — FERROUS SULFATE 325 (65 FE) MG PO TABS
325.0000 mg | ORAL_TABLET | Freq: Every day | ORAL | Status: DC
  Administered 2021-03-29 – 2021-03-30 (×2): 325 mg via ORAL
  Filled 2021-03-29 (×2): qty 1

## 2021-03-29 MED ORDER — MORPHINE SULFATE (PF) 2 MG/ML IV SOLN: 2.0000 mg | INTRAVENOUS | Status: DC | PRN

## 2021-03-29 MED ORDER — IOHEXOL 300 MG/ML  SOLN
100.0000 mL | Freq: Once | INTRAMUSCULAR | Status: AC | PRN
  Administered 2021-03-29: 100 mL via INTRAVENOUS

## 2021-03-29 MED ORDER — ATORVASTATIN CALCIUM 20 MG PO TABS
80.0000 mg | ORAL_TABLET | Freq: Every day | ORAL | Status: DC
  Administered 2021-03-29 – 2021-03-30 (×2): 80 mg via ORAL
  Filled 2021-03-29 (×2): qty 4

## 2021-03-29 MED ORDER — RIVAROXABAN 20 MG PO TABS
20.0000 mg | ORAL_TABLET | Freq: Every day | ORAL | Status: DC
  Administered 2021-03-29 – 2021-03-30 (×2): 20 mg via ORAL
  Filled 2021-03-29 (×3): qty 1

## 2021-03-29 MED ORDER — AMLODIPINE BESYLATE 5 MG PO TABS
5.0000 mg | ORAL_TABLET | Freq: Every day | ORAL | Status: DC
  Administered 2021-03-29 – 2021-03-30 (×2): 5 mg via ORAL
  Filled 2021-03-29 (×2): qty 1

## 2021-03-29 MED ORDER — HYDROCODONE-ACETAMINOPHEN 5-325 MG PO TABS
1.0000 | ORAL_TABLET | ORAL | Status: DC | PRN
  Administered 2021-03-29: 1 via ORAL
  Filled 2021-03-29: qty 1

## 2021-03-29 MED ORDER — PANTOPRAZOLE SODIUM 40 MG PO TBEC
40.0000 mg | DELAYED_RELEASE_TABLET | Freq: Every day | ORAL | Status: DC
  Administered 2021-03-29 – 2021-03-30 (×2): 40 mg via ORAL
  Filled 2021-03-29 (×2): qty 1

## 2021-03-29 MED ORDER — SILDENAFIL CITRATE 20 MG PO TABS
20.0000 mg | ORAL_TABLET | Freq: Three times a day (TID) | ORAL | Status: DC
  Administered 2021-03-29 – 2021-03-30 (×3): 20 mg via ORAL
  Filled 2021-03-29 (×6): qty 1

## 2021-03-29 MED ORDER — SODIUM CHLORIDE 0.9 % IV SOLN: INTRAVENOUS | Status: DC

## 2021-03-29 MED ORDER — SODIUM CHLORIDE 0.9 % IV BOLUS
500.0000 mL | Freq: Once | INTRAVENOUS | Status: AC
Start: 2021-03-29 — End: 2021-03-29
  Administered 2021-03-29: 500 mL via INTRAVENOUS

## 2021-03-29 NOTE — ED Provider Notes (Signed)
[facility] Emergency Department Provider Note  ____________________________________________   Event Date/Time   First MD Initiated Contact with Patient 03/29/21 1058     (approximate)  I have reviewed the triage vital signs and the nursing notes.   HISTORY  Chief Complaint Chest Pain and Nausea    HPI Jordan Perkins is a 79 y.o. male presents emergency department via EMS from his home complaining of nausea and chest pain.  No vomiting or diarrhea.  States the symptoms have been ongoing for about 2 to 3 days.  Increased weakness.  States he can barely stand.  He denies fever or chills.  States he is hurting in his abdomen along the left side, had blood in his stool left lower back but none now, is afraid that he has colon cancer or prostate cancer as he has never had it checked.  Patient lives with his brother in a trailer and his niece comes to clean once a week.  History of CHF, hypertension, PAH, A. fib, history of cirrhosis  Past Medical History:  Diagnosis Date   (HFpEF) heart failure with preserved ejection fraction (HCC)    CHF (congestive heart failure) (HCC)    Coronary artery disease    Hyperlipidemia    Hypertension    PAH (pulmonary artery hypertension) (HCC)    Permanent atrial fibrillation (HCC)    Pleural effusion     Patient Active Problem List   Diagnosis Date Noted   Acute hyponatremia 03/29/2021   Rash 03/08/2021   History of cirrhosis of liver    Ankle pain 07/01/2020   Hyposmolality and/or hyponatremia 05/13/2020   Chronic kidney disease 05/11/2020   Protein-calorie malnutrition, severe 05/04/2020   DNR (do not resuscitate)    Occult blood positive stool 04/15/2020   Chronic diastolic CHF (congestive heart failure) ([place]) 03/23/2020   Coronary artery disease    Stage 3a chronic kidney disease ([place]) 07/12/2019   Prediabetes 06/12/2019   Macrocytic anemia 03/10/2019   Pulmonary hypertension, unspecified (HCC)     Pleural effusion    CHF (congestive heart failure) ([place]) 02/16/2019   PAD (peripheral artery disease) ([place]) 01/02/2019   Bilateral carotid artery disease ([place]) 01/02/2019   Memory difficulty 11/05/2016   Seborrheic keratoses 11/05/2016   Hyponatremia 10/29/2016   CAD (coronary artery disease) 06/23/2013   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   Atrial fibrillation ([place]) 06/23/2013   Hyperlipidemia 06/23/2013   Essential hypertension 06/23/2013   Gallstone pancreatitis 06/23/2013    Past Surgical History:  Procedure Laterality Date   CARDIAC CATHETERIZATION     CHOLECYSTECTOMY     COLONOSCOPY WITH PROPOFOL N/A 08/23/2020   Procedure: COLONOSCOPY WITH PROPOFOL;  Surgeon: [name], MD;  Location: [facility] ENDOSCOPY;  Service: Gastroenterology;  Laterality: N/A;   CORONARY ANGIOPLASTY  06/2004   s/p stent placement @ [facility]   CORONARY ARTERY BYPASS GRAFT  04-24-2004   CABG x 3 [facility]   ESOPHAGOGASTRODUODENOSCOPY (EGD) WITH PROPOFOL N/A 08/23/2020   Procedure: ESOPHAGOGASTRODUODENOSCOPY (EGD) WITH PROPOFOL;  Surgeon: [name], MD;  Location: [facility];  Service: Gastroenterology;  Laterality: N/A;  Needs to be last case    Prior to Admission medications   Medication Sig Start Date End Date Taking? Authorizing Provider  atorvastatin (LIPITOR) 80 MG tablet TAKE 1 TABLET BY MOUTH EVERY DAY 03/23/21  Yes Gollan, Kathlene November, MD  ferrous sulfate 325 (65 FE) MG tablet TAKE 1 TABLET BY MOUTH EVERY DAY 12/13/20  Yes [name],  MD  furosemide (LASIX) 20 MG tablet Take 20 mg by mouth daily. 12/30/20  Yes [provider]  pantoprazole (PROTONIX) 40 MG tablet Take 1 tablet (40 mg total) by mouth daily. 07/08/20  Yes Loel Dubonnet, NP  sildenafil (REVATIO) 20 MG tablet Take 1 tablet (20 mg total) by mouth 3 (three) times daily. 12/06/20  Yes [name], MD  spironolactone (ALDACTONE) 25 MG tablet Take 1 tablet (25 mg total) by mouth daily. 07/04/20   Yes [name], MD  torsemide (DEMADEX) 20 MG tablet Take 1 tablet (20 mg total) by mouth daily. 07/25/20  Yes Loel Dubonnet, NP  rivaroxaban (XARELTO) 20 MG TABS tablet Take 1 tablet (20 mg total) by mouth daily with supper. 08/24/20   Loel Dubonnet, NP    Allergies Sulfa antibiotics and Other  Family History  Problem Relation Age of Onset   Heart disease Father    Heart disease Paternal Uncle    Heart attack Brother     Social History Social History   Tobacco Use   Smoking status: Never   Smokeless tobacco: Never  Vaping Use   Vaping Use: Never used  Substance Use Topics   Alcohol use: No   Drug use: No    Review of Systems  Constitutional: No fever/chills Eyes: No visual changes. ENT: No sore throat. Respiratory: Denies cough Cardiovascular: Positive chest pain Gastrointestinal: Positive abdominal pain Genitourinary: Negative for dysuria. Musculoskeletal: Negative for back pain. Skin: Negative for rash. Psychiatric: no mood changes,     ____________________________________________   PHYSICAL EXAM:  VITAL SIGNS: ED Triage Vitals  Enc Vitals Group     BP 03/29/21 0941 (!) 161/65     Pulse Rate 03/29/21 0939 (!) 59     Resp 03/29/21 0939 18     Temp 03/29/21 0939 97.9 F (36.6 C)     Temp Source 03/29/21 0939 Oral     SpO2 03/29/21 0939 99 %     Weight 03/29/21 0941 164 lb (74.4 kg)     Height 03/29/21 0941 5\' 11"  (1.803 m)     Head Circumference --      Peak Flow --      Pain Score 03/29/21 0941 7     Pain Loc --      Pain Edu? --      Excl. in [place]? --     Constitutional: Alert and oriented. Well appearing and in no acute distress.  Slight jaundice noted to the skin Eyes: Conjunctivae are normal.  Head: Atraumatic. Nose: No congestion/rhinnorhea. Mouth/Throat: Mucous membranes are moist.   Neck:  supple no lymphadenopathy noted Cardiovascular: Normal rate, irregular rhythm. Heart sounds are normal Respiratory: Normal  respiratory effort.  No retractions, lungs c t a  Abd: soft tender in the left upper quadrant bs normal all 4 quad GU: deferred Musculoskeletal: FROM all extremities, warm and well perfused, no pitting edema noted Neurologic:  Normal speech and language.  Skin:  Skin is warm, dry and intact. No rash noted. Psychiatric: Mood and affect are normal. Speech and behavior are normal.  ____________________________________________   LABS (all labs ordered are listed, but only abnormal results are displayed)  Labs Reviewed  BASIC METABOLIC PANEL - Abnormal; Notable for the following components:      Result Value   Sodium 120 (*)    Chloride 83 (*)    Glucose, Bld 102 (*)    All other components within normal limits  CBC - Abnormal; Notable  for the following components:   RBC 3.09 (*)    Hemoglobin 10.4 (*)    HCT 29.3 (*)    All other components within normal limits  HEPATIC FUNCTION PANEL - Abnormal; Notable for the following components:   Total Protein 8.3 (*)    Albumin 3.4 (*)    Total Bilirubin 2.3 (*)    Bilirubin, Direct 0.4 (*)    Indirect Bilirubin 1.9 (*)    All other components within normal limits  LIPASE, BLOOD - Abnormal; Notable for the following components:   Lipase 52 (*)    All other components within normal limits  RESP PANEL BY RT-PCR (FLU A&B, COVID) ARPGX2  SODIUM, URINE, RANDOM  CREATININE, URINE, RANDOM  OSMOLALITY, URINE  OSMOLALITY  TROPONIN I (HIGH SENSITIVITY)  TROPONIN I (HIGH SENSITIVITY)   ____________________________________________   ____________________________________________  RADIOLOGY  Chest x-ray CT chest abdomen/pelvis  ____________________________________________   PROCEDURES  Procedure(s) performed: No  Procedures    ____________________________________________   INITIAL IMPRESSION / ASSESSMENT AND PLAN / ED COURSE  Pertinent labs & imaging results that were available during my care of the patient were reviewed by me  and considered in my medical decision making (see chart for details).   Patient is a 80 year old male with history of A. fib and cirrhosis presents with nausea/chest pain and weakness.  See HPI.  Physical exam shows patient to be stable at this time  DDx: MI, CHF, CAP, COVID, bowel obstruction, liver failure  CBC shows his H&H is improved from 3 weeks ago Metabolic panel shows sodium of 120 which is decreased from 3 weeks.  Ekg shows afib, see physician read  Chest x-ray reviewed by me confirmed by radiology to have infiltrate in the right lower lung, some pulmonary congestion also  Due to the chest pain associated with nausea and abdominal pain, CT of the chest abdomen pelvis was ordered   Ct shows concerns for neoplasm  Due to the hyponatremia and weakness will have patient admitted Consult to hospitalist for admission Patient is admitted in stable condition  Jordan Perkins was evaluated in Emergency Department on 03/29/2021 for the symptoms described in the history of present illness. He was evaluated in the context of the global COVID-19 pandemic, which necessitated consideration that the patient might be at risk for infection with the SARS-CoV-2 virus that causes COVID-19. Institutional protocols and algorithms that pertain to the evaluation of patients at risk for COVID-19 are in a state of rapid change based on information released by regulatory bodies including the CDC and federal and state organizations. These policies and algorithms were followed during the patient's care in the ED.    As part of my medical decision making, I reviewed the following data within the [place] notes reviewed and incorporated, Labs reviewed , EKG interpreted atrial fibrillation, Old chart reviewed, Radiograph reviewed , Discussed with admitting physician , Notes from prior ED visits, and [place] Controlled Substance  Database  ____________________________________________   FINAL CLINICAL IMPRESSION(S) / ED DIAGNOSES  Final diagnoses:  Abdominal pain  Weakness  Hyponatremia      NEW MEDICATIONS STARTED DURING THIS VISIT:  New Prescriptions   No medications on file     Note:  This document was prepared using Dragon voice recognition software and may include unintentional dictation errors.    Versie Starks, PA-C 03/29/21 1313    Merlyn Lot, MD 03/29/21 1321

## 2021-03-29 NOTE — ED Notes (Signed)
Pt family called and updated; I spoke with Dewaine Oats patients niece.

## 2021-03-29 NOTE — ED Notes (Signed)
Called and gave report to christina, rn. She said that patient is going to 7

## 2021-03-29 NOTE — ED Notes (Signed)
Jordan Perkins, EDT obtained a full rainbow, (light green, lav, and blue top)

## 2021-03-29 NOTE — ED Notes (Signed)
Pt alert and oriented, does not have any questions at this time.

## 2021-03-29 NOTE — ED Triage Notes (Signed)
Pt here via [facility] with nausea and CP. Pt states this has been going on for a few days and that he has a lot of health issues lately. Pt stable in triage.

## 2021-03-29 NOTE — H&P (Addendum)
History and Physical    Jordan Perkins OMB:[id number] DOB: 24-Aug-1941 DOA: 03/29/2021  PCP: [name], MD  Chief Complaint: Generalized weakness, nausea, abdominal pain  HPI: Jordan Perkins is a 80 y.o. male with a past medical history of diastolic CHF, hypertension, coronary artery disease status post CABG, PAH, chronic A. Fib, peripheral arterial disease, bilateral carotid artery disease, dyslipidemia.  The patient presents to the emergency department with nausea, generalized weakness for the past 3 to 4 days.  He states he can barely stand.  He uses a cane at baseline to assist with ambulation.  No fevers or chills.  Endorses some left-sided abdominal pain but states he has not had a bowel movement in 2 days.  He does have intermittent chest pain that is burning in nature.  Reports a dry cough.  No shortness of breath.  No wheezing.  No blood in his stools.  He has black stools but he is chronically on iron.  He states he has never had a colonoscopy.  He is concerned that he may have colon cancer or prostate cancer.  Denies any significant unintentional weight loss.  No night sweats.    ED Course: Lab work and imaging obtained in the emergency department.  Patient has been given a normal saline 500 cc bolus in the emergency department.  Plain film chest x-ray shows possible right lower lobe infiltrate with pulmonary congestion.  CT imaging of the chest abdomen pelvis shows right lung base masslike consolidation/atelectasis with radiology favoring around atelectasis over neoplasm.  Recommend elective outpatient PET/CT.  Review of Systems: 14 point review of systems is negative except for what is mentioned above in the HPI.   Past Medical History:  Diagnosis Date   (HFpEF) heart failure with preserved ejection fraction (HCC)    CHF (congestive heart failure) (HCC)    Coronary artery disease    Hyperlipidemia    Hypertension    PAH (pulmonary artery hypertension) (HCC)     Permanent atrial fibrillation (HCC)    Pleural effusion     Past Surgical History:  Procedure Laterality Date   CARDIAC CATHETERIZATION     CHOLECYSTECTOMY     COLONOSCOPY WITH PROPOFOL N/A 08/23/2020   Procedure: COLONOSCOPY WITH PROPOFOL;  Surgeon: [name], MD;  Location: [facility] ENDOSCOPY;  Service: Gastroenterology;  Laterality: N/A;   CORONARY ANGIOPLASTY  06/2004   s/p stent placement @ [facility]   CORONARY ARTERY BYPASS GRAFT  04-24-2004   CABG x 3 [facility]   ESOPHAGOGASTRODUODENOSCOPY (EGD) WITH PROPOFOL N/A 08/23/2020   Procedure: ESOPHAGOGASTRODUODENOSCOPY (EGD) WITH PROPOFOL;  Surgeon: [name], MD;  Location: [place];  Service: Gastroenterology;  Laterality: N/A;  Needs to be last case    Social History   Socioeconomic History   Marital status: Single    Spouse name: none   Number of children: Not on file   Years of education: high school   Highest education level: Not on file  Occupational History   Occupation: retired  Tobacco Use   Smoking status: Never   Smokeless tobacco: Never  Vaping Use   Vaping Use: Never used  Substance and Sexual Activity   Alcohol use: No   Drug use: No   Sexual activity: Not on file  Other Topics Concern   Not on file  Social History Narrative   Not on file   Social Determinants of Health   Financial Resource Strain: Not on file  Food Insecurity: No Food Insecurity   Worried  About Running Out of Food in the Last Year: Never true   Ran Out of Food in the Last Year: Never true  Transportation Needs: Unmet Transportation Needs   Lack of Transportation (Medical): Yes   Lack of Transportation (Non-Medical): No  Physical Activity: Unknown   Days of Exercise per Week: 0 days   Minutes of Exercise per Session: Not on file  Stress: Not on file  Social Connections: Unknown   Frequency of Communication with Friends and Family: More than three times a week   Frequency of Social Gatherings with Friends and Family:  More than three times a week   Attends Religious Services: Patient refused   Marine scientist or Organizations: Patient refused   Attends Music therapist: Patient refused   Marital Status: Never married  Human resources officer Violence: Not At Risk   Fear of Current or Ex-Partner: No   Emotionally Abused: No   Physically Abused: No   Sexually Abused: No    Allergies  Allergen Reactions   Sulfa Antibiotics Rash    Rash/hives    Other Rash    Family History  Problem Relation Age of Onset   Heart disease Father    Heart disease Paternal Uncle    Heart attack Brother     Prior to Admission medications   Medication Sig Start Date End Date Taking? Authorizing Provider  atorvastatin (LIPITOR) 80 MG tablet TAKE 1 TABLET BY MOUTH EVERY DAY 03/23/21   [name], MD  ferrous sulfate 325 (65 FE) MG tablet TAKE 1 TABLET BY MOUTH EVERY DAY 12/13/20   [name], MD  furosemide (LASIX) 20 MG tablet Take 20 mg by mouth daily. 12/30/20   [provider]  pantoprazole (PROTONIX) 40 MG tablet Take 1 tablet (40 mg total) by mouth daily. 07/08/20   Loel Dubonnet, NP  rivaroxaban (XARELTO) 20 MG TABS tablet Take 1 tablet (20 mg total) by mouth daily with supper. 08/24/20   Loel Dubonnet, NP  sildenafil (REVATIO) 20 MG tablet Take 1 tablet (20 mg total) by mouth 3 (three) times daily. 12/06/20   [name], MD  spironolactone (ALDACTONE) 25 MG tablet Take 1 tablet (25 mg total) by mouth daily. 07/04/20   [name], MD  torsemide (DEMADEX) 20 MG tablet Take 1 tablet (20 mg total) by mouth daily. 07/25/20   Loel Dubonnet, NP    Physical Exam: Vitals:   03/29/21 0939 03/29/21 0941 03/29/21 1130 03/29/21 1245  BP:  (!) 161/65 (!) 160/63 (!) 170/69  Pulse: (!) 59  (!) 56 64  Resp: 18  17 18   Temp: 97.9 F (36.6 C)     TempSrc: Oral     SpO2: 99%  100% 98%  Weight:  74.4 kg    Height:  5\' 11"  (1.803 m)       General:  Appears calm  and comfortable and is in NAD, clinically appears dry Cardiovascular: Irregular rhythm with regular rate Respiratory:   CTA bilaterally with no wheezes/rales/rhonchi.  Normal respiratory effort. Abdomen:  soft, NT, ND, NABS Skin:  no rash or induration seen on limited exam Musculoskeletal:  grossly normal tone BUE/BLE, good ROM, no bony abnormality Lower extremity:  No LE edema.  Limited foot exam with no ulcerations.  2+ distal pulses. Psychiatric:  grossly normal mood and affect, speech fluent and appropriate, AOx3 Neurologic:  CN 2-12 grossly intact, moves all extremities in coordinated fashion, sensation intact    Radiological  Exams on Admission: Independently reviewed - see discussion in A/P where applicable  DG Chest 2 View  Result Date: 03/29/2021 CLINICAL DATA:  Chest pain. EXAM: CHEST - 2 VIEW COMPARISON:  04/30/2020. FINDINGS: Prior CABG. Stable cardiomegaly. Mild pulmonary venous congestion. Mild bibasilar atelectasis. Mild right base infiltrate cannot be excluded. Small right pleural effusion. No pneumothorax. IMPRESSION: 1. Prior CABG. Stable cardiomegaly. Mild pulmonary venous congestion. 2.  Low lung volumes with bibasilar atelectasis. 3. Mild right base infiltrate cannot be excluded. Small right pleural effusion. Electronically Signed   By: Marcello Moores  Register   On: 03/29/2021 10:46   CT CHEST ABDOMEN PELVIS W CONTRAST  Result Date: 03/29/2021 CLINICAL DATA:  Abdominal pain, chest pain, shortness of breath EXAM: CT CHEST, ABDOMEN, AND PELVIS WITH CONTRAST TECHNIQUE: Multidetector CT imaging of the chest, abdomen and pelvis was performed following the standard protocol during bolus administration of intravenous contrast. CONTRAST:  114mL OMNIPAQUE IOHEXOL 300 MG/ML  SOLN COMPARISON:  03/23/2020 FINDINGS: CT CHEST FINDINGS Cardiovascular: Mild cardiomegaly with biatrial enlargement. Dilated central pulmonary arteries suggesting pulmonary hypertension. Mitral annulus calcifications.  Extensive coronary calcifications. Previous CABG. Atheromatous thoracic aorta without aneurysm or dissection. Mediastinum/Nodes: No mass or adenopathy. Lungs/Pleura: Persistent small right pleural effusion with some pleural thickening. Mass-like peripheral consolidation/atelectasis at the right lung base has slightly increased, but given the curving of bronchovascular structures favor round atelectasis over neoplasm. Left lung is clear. Musculoskeletal: Sternotomy wires. No fracture or worrisome bone lesion. CT ABDOMEN PELVIS FINDINGS Hepatobiliary: No focal liver abnormality is seen. Status post cholecystectomy. No biliary dilatation. Pancreas: Unremarkable. No pancreatic ductal dilatation. Mild inflammatory/edematous change or in the pancreatic head is stable. No peripancreatic fluid collections. Spleen: Normal in size without focal abnormality. Adrenals/Urinary Tract: Adrenal glands unremarkable. Bilateral renal cysts, largest 4.1 cm from right lower pole, remainder much smaller. No urolithiasis or hydronephrosis. The urinary bladder is physiologically distended. Stomach/Bowel: The stomach is incompletely distended. The small bowel is nondilated. Normal appendix. Colon is nondilated, unremarkable. Vascular/Lymphatic: Scattered aortoiliac calcified plaque without aneurysm or evident stenosis. No abdominal or pelvic adenopathy. Reproductive: Mild prostate enlargement with central coarse calcifications. Other: The ascites seen previously has resolved.  No free air. Musculoskeletal: Degenerative disc disease L4-5. No fracture or worrisome bone lesion. IMPRESSION: 1. No acute findings. 2. Chronic small right pleural effusion with worsening masslike consolidation/atelectasis at the right lung base, favor round atelectasis over neoplasm. Elective outpatient PET-CT may provide more definitive differentiation. 3. Coronary and Aortic Atherosclerosis (ICD10-170.0). Electronically Signed   By: Lucrezia Europe M.D.   On:  03/29/2021 12:30    EKG: Independently reviewed.  A. fib with rate 62   Labs on Admission: I have personally reviewed the available labs and imaging studies at the time of the admission.  Pertinent labs: Sodium 120, chloride 83, hemoglobin 10.4, hematocrit 29.3, T bili 2.3.     Assessment/Plan:  Generalized weakness likely secondary to acute on chronic hyponatremia: He has a history of chronic hyponatremia. He has been encouraged to increase salt in his diet according to the palliative care notes.  My review of his labs this is the lowest his sodium has been.  Has been given a normal saline 500 cc bolus in the emergency department.  We will start him on maintenance fluids with normal saline at 75 cc an hour.  Repeat BMP every 6 hours. Nephrology consulted for further recommendations.  Consider starting him on salt tabs.  Reviewing his med rec he is on 2 diuretics on furosemide and torsemide.  I reviewed his last visit with cardiology for CHF and it appears that he is only supposed to be on torsemide.  Clinically currently appears dry.  We will hold the torsemide and Aldactone temporarily as it could be contributing to the hyponatremia synergistically.  Will discontinue the furosemide upon discharge.    Diastolic CHF, not in acute decompensation: Currently holding diuretics temporarily as noted above.  Monitor clinically for signs of volume overload.  Essential hypertension, uncontrolled: Blood pressures in the 381'W systolics.  We will start him on amlodipine 5 mg daily.  Incidental finding for masslike consolidation: CT imaging notes masslike consolidation favoring atelectasis but recommends outpatient PET CT scan to further evaluate for possibly neoplasm.  Elevated total bilirubin: Has a previous cholecystectomy.  Will order right upper quadrant ultrasound.  Chest pain: He describes as burning.  EKG shows no acute ischemic changes.  Troponins are 13, 13.  Unlikely to be ACS. Can consider  increasing his home Protonix if chest pain persists.  Iron deficiency anemia: Continue home ferrous sulfate 325 mg daily  GERD: Continue home Protonix 40 mg daily  Chronic atrial fibrillation: Continue home Xarelto 20 mg daily  Pulmonary arterial hypertension: Continue home sildenafil 20 mg 3 times daily  Coronary artery disease: Continue home atorvastatin 80 mg daily  Cirrhosis: No acute treatment.  No clinical signs of decompensation.  Continue monitoring.  Peripheral vascular disease: Denies any claudication.  Carotid artery disease, bilaterally: Followed outpatient with periodic ultrasounds  Level of Care: Med surg DVT prophylaxis: On Xarelto already at home Code Status: Full code Consults: None Admission status: Inpatient   Leslee Home DO [facility]   How to contact the [facility] Attending or Consulting provider [place] or covering provider during after hours [place], for this patient?  Check the care team in [facility] and look for a) attending/consulting [facility] provider listed and b) the [facility] team listed Log into [email or web address] and use [place] universal password to access. If you do not have the password, please contact the hospital operator. Locate the [facility] provider you are looking for under [facility] and page to a number that you can be directly reached. If you still have difficulty reaching the provider, please page the [facility] (Director on Call) for the Hospitalists listed on amion for assistance.   03/29/2021, 1:04 PM

## 2021-03-29 NOTE — ED Triage Notes (Signed)
First Nurse Note:  Arrives from home via [facility].  C/O nausea and generalized weakness x 4 days. Decrease appetite and generalized pain.  Hx Afib, CABG x 3.  VS wnl.  Zofran taken at home PTA.

## 2021-03-30 ENCOUNTER — Telehealth: Payer: Self-pay | Admitting: Family Medicine

## 2021-03-30 DIAGNOSIS — I10 Essential (primary) hypertension: Secondary | ICD-10-CM | POA: Diagnosis not present

## 2021-03-30 DIAGNOSIS — E871 Hypo-osmolality and hyponatremia: Secondary | ICD-10-CM | POA: Diagnosis not present

## 2021-03-30 LAB — BASIC METABOLIC PANEL
Anion gap: 4 — ABNORMAL LOW (ref 5–15)
Anion gap: 10 (ref 5–15)
BUN: 21 mg/dL (ref 8–23)
BUN: 20 mg/dL (ref 8–23)
CO2: 28 mmol/L (ref 22–32)
CO2: 25 mmol/L (ref 22–32)
Calcium: 9.1 mg/dL (ref 8.9–10.3)
Calcium: 9 mg/dL (ref 8.9–10.3)
Chloride: 94 mmol/L — ABNORMAL LOW (ref 98–111)
Chloride: 93 mmol/L — ABNORMAL LOW (ref 98–111)
Creatinine, Ser: 1 mg/dL (ref 0.61–1.24)
Creatinine, Ser: 0.91 mg/dL (ref 0.61–1.24)
GFR, Estimated: >60 mL/min (ref >60)
GFR, Estimated: >60 mL/min (ref >60)
Glucose, Bld: 111 mg/dL — ABNORMAL HIGH (ref 70–99)
Glucose, Bld: 131 mg/dL — ABNORMAL HIGH (ref 70–99)
Potassium: 4 mmol/L (ref 3.5–5.1)
Potassium: 3.8 mmol/L (ref 3.5–5.1)
Sodium: 126 mmol/L — ABNORMAL LOW (ref 135–145)
Sodium: 128 mmol/L — ABNORMAL LOW (ref 135–145)

## 2021-03-30 LAB — CBC
HCT: 27.6 % — ABNORMAL LOW (ref 39.0–52.0)
Hemoglobin: 9.7 g/dL — ABNORMAL LOW (ref 13.0–17.0)
MCH: 34 pg (ref 26.0–34.0)
MCHC: 35.1 g/dL (ref 30.0–36.0)
MCV: 96.8 fL (ref 80.0–100.0)
Platelets: 167 10*3/uL (ref 150–400)
RBC: 2.85 MIL/uL — ABNORMAL LOW (ref 4.22–5.81)
RDW: 13.6 % (ref 11.5–15.5)
WBC: 6.8 10*3/uL (ref 4.0–10.5)
nRBC: 0 % (ref 0.0–0.2)

## 2021-03-30 LAB — PSA: Prostatic Specific Antigen: 0.7 ng/mL (ref 0.00–4.00)

## 2021-03-30 NOTE — Evaluation (Signed)
Occupational Therapy Evaluation Patient Details Name: Jordan Perkins MRN: [id number] DOB: 22-Apr-1941 Today's Date: 03/30/2021    History of Present Illness 80 y.o. male with a past medical history of diastolic CHF, hypertension, coronary artery disease status post CABG, PAH, chronic A. Fib, peripheral arterial disease, bilateral carotid artery disease, dyslipidemia.  The patient presented to the emergency department with nausea, generalized weakness.  Pt with low sodium, reporting he could hardly stand on arrival.   Clinical Impression   Patient presenting with decreased I in self care, balance, functional mobility/transfers, endurance,and safety awareness. Patient reports living with brother and use of [facility] for functional mobility PTA. He is independent in self care tasks and cooks at home.His family assists with laundry and some cleaning. He endorses landlord drives them to get groceries or to appointments. Patient currently functioning at supervision level with use of [facility] for functional mobility and self care task. No significant LOB but pt reports not feeling as supported as he usually does. OT recommended use of RW at home. Patient will benefit from acute OT to increase overall independence in the areas of ADLs, functional mobility, and safety awareness in order to safely discharge home with family.    Follow Up Recommendations  No OT follow up;Supervision - Intermittent    Equipment Recommendations  None recommended by OT;Other (comment) (Pt reports use of SPC at baseline but does have RW. Pt agrees that he is more stable with RW at this time and recommended he use at discharge for safety.)       Precautions / Restrictions Precautions Precautions: Fall Restrictions Weight Bearing Restrictions: No      Mobility Bed Mobility Overal bed mobility: Modified Independent             General bed mobility comments: seated in recliner chair    Transfers Overall transfer level:  Needs assistance Equipment used: Straight cane Transfers: Sit to/from Stand;Stand Pivot Transfers Sit to Stand: Supervision Stand pivot transfers: Supervision       General transfer comment: Pt was able to rise w/o hesitation or assist, no lightheadness, LOBs or other concerns.    Balance Overall balance assessment: Mild deficits observed, not formally tested                                         ADL either performed or assessed with clinical judgement   ADL                                         General ADL Comments: supervision overall for dressing, toileting, and functional transfer this session. Pt with no LOB or dizziness with functional tasks.     Vision Patient Visual Report: No change from baseline              Pertinent Vitals/Pain Pain Assessment: No/denies pain     Hand Dominance Right   Extremity/Trunk Assessment Upper Extremity Assessment Upper Extremity Assessment: Overall WFL for tasks assessed;Generalized weakness   Lower Extremity Assessment Lower Extremity Assessment: Overall WFL for tasks assessed;Generalized weakness       Communication Communication Communication: No difficulties   Cognition Arousal/Alertness: Awake/alert Behavior During Therapy: WFL for tasks assessed/performed Overall Cognitive Status: Within Functional Limits for tasks assessed  General Comments: Pt is very pleasant and cooperative              Home Living Family/patient expects to be discharged to:: Private residence Living Arrangements: Other relatives (brother) Available Help at Discharge: Family;Available 24 hours/day Type of Home: House Home Access: Stairs to enter [employer] of Steps: 4 Entrance Stairs-Rails: Right Home Layout: One level     Bathroom Shower/Tub: Occupational psychologist: Standard Bathroom Accessibility: Yes   Home Equipment:  Environmental consultant - 2 wheels;Cane - single point;Shower seat          Prior Functioning/Environment Level of Independence: Independent with assistive device(s)        Comments: Pt reports Mod I with use of SPC for mobility. He endorses performing his own self care, cooking, and some light cleaning in home. Landlord drives him to grocery/appts and family assists with laundry. Pt endorses performing his own medication management.        OT Problem List: Decreased strength;Decreased activity tolerance;Decreased safety awareness;Impaired balance (sitting and/or standing)      OT Treatment/Interventions: Self-care/ADL training;Balance training;Therapeutic exercise;Modalities;Therapeutic activities;Energy conservation;Cognitive remediation/compensation;DME and/or AE instruction;Patient/family education;Manual therapy    OT Goals(Current goals can be found in the care plan section) Acute Rehab OT Goals Patient Stated Goal: go home OT Goal Formulation: With patient Time For Goal Achievement: 04/13/21 Potential to Achieve Goals: Fair ADL Goals Pt Will Perform Grooming: with modified independence;standing Pt Will Perform Lower Body Dressing: with modified independence;sit to/from stand Pt Will Transfer to Toilet: with modified independence;ambulating Pt Will Perform Toileting - Clothing Manipulation and hygiene: with modified independence;sit to/from stand  OT Frequency: Min 2X/week   Barriers to D/C:    none known at this time          AM-PAC OT "6 Clicks" Daily Activity     Outcome Measure Help from another person eating meals?: None Help from another person taking care of personal grooming?: None Help from another person toileting, which includes using toliet, bedpan, or urinal?: A Little Help from another person bathing (including washing, rinsing, drying)?: None Help from another person to put on and taking off regular upper body clothing?: None Help from another person to put on and  taking off regular lower body clothing?: A Little 6 Click Score: 22   End of Session Equipment Utilized During Treatment: Other (comment) (SPC)  Activity Tolerance: Patient tolerated treatment well Patient left: in chair;with call bell/phone within reach;with chair alarm set  OT Visit Diagnosis: Unsteadiness on feet (R26.81);Muscle weakness (generalized) (M62.81)                Time: 6759-1638 OT Time Calculation (min): 24 min Charges:  OT General Charges $OT Visit: 1 Visit OT Evaluation $OT Eval Low Complexity: 1 Low OT Treatments $Self Care/Home Management : 8-22 mins  Darleen Crocker, MS, OTR/L , CBIS ascom [phone]  03/30/21, 10:27 AM

## 2021-03-30 NOTE — Care Management Important Message (Signed)
Important Message  Patient Details  Name: Jordan Perkins MRN: [id number] Date of Birth: 19-Nov-1940   Medicare Important Message Given:  N/A - LOS <3 / Initial given by admissions  Initial Medicare IM reviewed with [name], POA, by [name], Patient Access Associate on 03/30/2021 at 10:34am.   [name] 03/30/2021, 2:03 PM

## 2021-03-30 NOTE — TOC Transition Note (Signed)
Transition of Care [facility]) - CM/SW Discharge Note   Patient Details  Name: Jordan Perkins MRN: [id number] Date of Birth: 11-Aug-1941  Transition of Care [facility]) CM/SW Contact:  Anselm Pancoast, RN Phone Number: 03/30/2021, 4:42 PM   Clinical Narrative:    Called into room and spoke to patient regarding Code 44 status and change to observation. Patient verbalized understanding and very appreciative of contact. States he is eager to discharge home and will need assistance getting home. Patient reports nurse is calling him a taxi for discharge. No other needs at this time.      Barriers to Discharge: Continued Medical Work up   Patient Goals and CMS Choice        Discharge Placement                       Discharge Plan and Services   Discharge Planning Services: CM Consult                      HH Arranged: RN [facility] Agency: [place] Date [place]: 03/30/21   Representative spoke with at [place]: [place]  Social Determinants of Health ([place]) Interventions     Readmission Risk Interventions No flowsheet data found.

## 2021-03-30 NOTE — TOC Initial Note (Signed)
Transition of Care [facility]) - Initial/Assessment Note    Patient Details  Name: Jordan Perkins MRN: [id number] Date of Birth: [birth year]  Transition of Care [facility]) CM/SW Contact:    Beverly Sessions, RN Phone Number: 03/30/2021, 12:18 PM  Clinical Narrative:                 Patient admitted from home with hyponatremia Patient lives at home with brother  PCP Caryl Bis - states that his landlord typically provides transportation Denies issues obtaining medications  PT has assessed patient and does not recommend any PT follow up Patient states that he has a RW and cane  Patient agreeable for home health RN services for CHF.  Patient states he does not have a preference of home health agency.  Referral made to [place] with [facility].  Orders faxed Patient confirms he has scales in the home to check daily weights   Expected Discharge Plan: [place] Barriers to Discharge: Continued Medical Work up   Patient Goals and CMS Choice        Expected Discharge Plan and Services Expected Discharge Plan: [place]   Discharge Planning Services: CM Consult   Living arrangements for the past 2 months: Single Family Home                           HH Arranged: RN [place] Agency: [place] Date [facility] Agency Contacted: 03/30/21   Representative spoke with at [place]: [place]  Prior Living Arrangements/Services Living arrangements for the past 2 months: [place] [address] with:: Siblings Patient language and need for interpreter reviewed:: Yes Do you feel safe going back to the place where you live?: Yes      Need for Family Participation in Patient Care: Yes (Comment) Care giver support system in place?: Yes (comment) Current home services: DME Criminal Activity/Legal Involvement Pertinent to Current Situation/Hospitalization: No - Comment as needed  Activities of Daily Living Home Assistive Devices/Equipment: Cane (specify quad or  straight), Eyeglasses (STRAIGHT) ADL Screening (condition at time of admission) Patient's cognitive ability adequate to safely complete daily activities?: Yes Is the patient deaf or have difficulty hearing?: No Does the patient have difficulty seeing, even when wearing glasses/contacts?: No Does the patient have difficulty concentrating, remembering, or making decisions?: No Patient able to express need for assistance with ADLs?: No Does the patient have difficulty dressing or bathing?: No Independently performs ADLs?: Yes (appropriate for developmental age) Does the patient have difficulty walking or climbing stairs?: Yes Weakness of Legs: Both Weakness of Arms/Hands: None  Permission Sought/Granted                  Emotional Assessment       Orientation: : Oriented to Self, Oriented to Place, Oriented to  Time, Oriented to Situation Alcohol / Substance Use: Not Applicable Psych Involvement: No (comment)  Admission diagnosis:  Acute hyponatremia [E87.1] Hyponatremia [E87.1] Weakness [R53.1] Abdominal pain [R10.9] Atrial fibrillation, unspecified type ([place]) [I48.91] Patient Active Problem List   Diagnosis Date Noted   Acute hyponatremia 03/29/2021   Rash 03/08/2021   History of cirrhosis of liver    Ankle pain 07/01/2020   Hyposmolality and/or hyponatremia 05/13/2020   Chronic kidney disease 05/11/2020   Protein-calorie malnutrition, severe 05/04/2020   DNR (do not resuscitate)    Occult blood positive stool 04/15/2020   Chronic diastolic CHF (congestive heart failure) ([place]) 03/23/2020   Coronary  artery disease    Stage 3a chronic kidney disease ([place]) 07/12/2019   Prediabetes 06/12/2019   Macrocytic anemia 03/10/2019   Pulmonary hypertension, unspecified (HCC)    Pleural effusion    CHF (congestive heart failure) ([place]) 02/16/2019   PAD (peripheral artery disease) ([place]) 01/02/2019   Bilateral carotid artery disease ([place]) 01/02/2019   Memory difficulty 11/05/2016    Seborrheic keratoses 11/05/2016   Hyponatremia 10/29/2016   CAD (coronary artery disease) 06/23/2013   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 [id number]: [phone]: [phone]    Social Determinants of Health (SDOH) Interventions    Readmission Risk Interventions No flowsheet data found.

## 2021-03-30 NOTE — Evaluation (Signed)
Physical Therapy Evaluation Patient Details Name: Jordan Perkins MRN: [id number] DOB: April 13, 1941 Today's Date: 03/30/2021   History of Present Illness  80 y.o. male with a past medical history of diastolic CHF, hypertension, coronary artery disease status post CABG, PAH, chronic A. Fib, peripheral arterial disease, bilateral carotid artery disease, dyslipidemia.  The patient presented to the emergency department with nausea, generalized weakness.  Pt with low sodium, reporting he could hardly stand on arrival.  Clinical Impression  Pt with low sodium (most recently 126) at time of eval, spoke with nurse before session and okayed for participation with PT.  Pt ultimately did quite well, he was able to get to EOB and to standing w/o any assist, he ambulated ~200 ft around the nurses' station with consistent and safe cadence using RW.  His HR did increase from ~60 at rest to ~100 with activity but he reports only mild fatigue.  Pt safe to return home and after discussion with PT feels that he can get back into his normal routine w/o HHPT; he uses SPC at baseline but instructed to use walker when he initially returns home and he agrees.  Will maintain on caseload while admitted, plan to trial [facility] and stairs next session.      Follow Up Recommendations No PT follow up (will maintain on caseload to assess steps, amb with SPC, maximize mobility/activity)    Equipment Recommendations  None recommended by PT    Recommendations for Other Services       Precautions / Restrictions Precautions Precautions: Fall Restrictions Weight Bearing Restrictions: No      Mobility  Bed Mobility Overal bed mobility: Modified Independent             General bed mobility comments: easily transitions to EOB from supine w/o issue    Transfers Overall transfer level: Modified independent Equipment used: Rolling walker (2 wheeled)             General transfer comment: Pt was able to rise w/o  hesitation or assist, no lightheadness, LOBs or other concerns.  Ambulation/Gait Ambulation/Gait assistance: Supervision Gait Distance (Feet): 200 Feet Assistive device: Rolling walker (2 wheeled)       General Gait Details: Pt was able to maintain relatively consistent cadence with good safety during circumambulation of the nurses' station.  He was not overly reliant on the walker (typically uses a cane, but has a walker at home) and reported only mild fatigue with the effort.  His HR did increase from 60s to ~100 with the effort but O2 was in the high 90s and overall he did very well.  Stairs            Wheelchair Mobility    Modified Rankin (Stroke Patients Only)       Balance Overall balance assessment: Modified Independent                                           Pertinent Vitals/Pain Pain Assessment:  (reports mild b/l foot soreness, no real pain)    Home Living Family/patient expects to be discharged to:: Private residence Living Arrangements: Other relatives (brother) Available Help at Discharge: Family;Available 24 hours/day (landloard runs errands/drives PRN, family member stops in most weekends to clean and assist around the home) Type of Home: House Home Access: Stairs to enter Entrance Stairs-Rails: Right Entrance Stairs-Number of Steps: 4 Home Layout: One  level Home Equipment: Walker - 2 wheels;Cane - single point      Prior Function Level of Independence: Needs assistance         Comments: Pt states independent w/ bathing, dressing, and household mobility. Vocalizes he uses a SPC or 2WW at baseline for household ambulation w/o difficulty. Pt states does not ambulate community distances and uses facility buggy or WC     Hand Dominance        Extremity/Trunk Assessment   Upper Extremity Assessment Upper Extremity Assessment: Overall WFL for tasks assessed;Generalized weakness    Lower Extremity Assessment Lower Extremity  Assessment: Overall WFL for tasks assessed;Generalized weakness       Communication   Communication: No difficulties  Cognition Arousal/Alertness: Awake/alert Behavior During Therapy: WFL for tasks assessed/performed Overall Cognitive Status: Within Functional Limits for tasks assessed                                        General Comments      Exercises     Assessment/Plan    PT Assessment Patient needs continued PT services  PT Problem List Decreased activity tolerance;Decreased knowledge of use of DME;Decreased safety awareness;Decreased balance;Decreased mobility;Cardiopulmonary status limiting activity       PT Treatment Interventions Gait training;Stair training;Functional mobility training;Therapeutic activities;Therapeutic exercise;Balance training;Cognitive remediation;Patient/family education    PT Goals (Current goals can be found in the Care Plan section)  Acute Rehab PT Goals Patient Stated Goal: go home PT Goal Formulation: With patient Time For Goal Achievement: 04/13/21 Potential to Achieve Goals: Good    Frequency Min 2X/week   Barriers to discharge        Co-evaluation               AM-PAC PT "6 Clicks" Mobility  Outcome Measure Help needed turning from your back to your side while in a flat bed without using bedrails?: None Help needed moving from lying on your back to sitting on the side of a flat bed without using bedrails?: None Help needed moving to and from a bed to a chair (including a wheelchair)?: None Help needed standing up from a chair using your arms (e.g., wheelchair or bedside chair)?: None Help needed to walk in hospital room?: None Help needed climbing 3-5 steps with a railing? : None 6 Click Score: 24    End of Session Equipment Utilized During Treatment: Gait belt Activity Tolerance: Patient tolerated treatment well Patient left: with call bell/phone within reach;with chair alarm set Nurse  Communication: Mobility status PT Visit Diagnosis: Muscle weakness (generalized) (M62.81);Difficulty in walking, not elsewhere classified (R26.2)    Time: [id number] PT Time Calculation (min) (ACUTE ONLY): 28 min   Charges:   PT Evaluation $PT Eval Low Complexity: 1 Low PT Treatments $Gait Training: 8-22 mins        Kreg Shropshire, DPT 03/30/2021, 9:46 AM

## 2021-03-30 NOTE — Progress Notes (Signed)
Patient given instructions to keep follow up appointments, Ivs taken out with bleeding noted & pressure applied, tele discontinued, when to return for worsening symptoms, & awaiting transportation

## 2021-03-30 NOTE — Consult Note (Signed)
[facility]  CONSULT NOTE    Date: 03/30/2021                  Patient Name:  Jordan Perkins  MRN: [id number]  DOB: March 21, 1941  Age / Sex: 80 y.o., male         PCP: [name], MD                 Service Requesting Consult: [facility]                 Reason for Consult: Hyponatremia            History of Present Illness: Mr. Bow Buntyn is a 80 y.o.  male with CHF, hyperlipidemia, hypertension, atrial fibrillation, and CAD, who was admitted to [facility] on 03/29/2021 for Acute hyponatremia [E87.1] Hyponatremia [E87.1] Weakness [R53.1] Abdominal pain [R10.9] Atrial fibrillation, unspecified type ([place]) [I48.91]   We have been consulted to evaluate and assist with hyponatremia management. Patient currently sees Dr. Caryl Bis who manages this outpatient. Patient presented to ED with nausea and weakness. Denies loss of appetite and states he drinks 8 glasses of water a day. Denies shortness of breath Intermittent cough, nonproductive. Takes medications as prescribed.    Medications: Outpatient medications: Medications Prior to Admission  Medication Sig Dispense Refill Last Dose   atorvastatin (LIPITOR) 80 MG tablet TAKE 1 TABLET BY MOUTH EVERY DAY 90 tablet 0 03/28/2021   ferrous sulfate 325 (65 FE) MG tablet TAKE 1 TABLET BY MOUTH EVERY DAY 90 tablet 1 03/28/2021   pantoprazole (PROTONIX) 40 MG tablet Take 1 tablet (40 mg total) by mouth daily. 60 tablet 11 03/28/2021   rivaroxaban (XARELTO) 20 MG TABS tablet Take 1 tablet (20 mg total) by mouth daily with supper. 30 tablet 5 03/28/2021   sildenafil (REVATIO) 20 MG tablet Take 1 tablet (20 mg total) by mouth 3 (three) times daily. 90 tablet 7 03/28/2021   spironolactone (ALDACTONE) 25 MG tablet Take 1 tablet (25 mg total) by mouth daily. 90 tablet 3 03/28/2021   torsemide (DEMADEX) 20 MG tablet Take 1 tablet (20 mg total) by mouth daily. 90 tablet 3 03/28/2021    Current medications: Current  Facility-Administered Medications  Medication Dose Route Frequency Provider Last Rate Last Admin   0.9 %  sodium chloride infusion   Intravenous Continuous Leslee Home, DO 75 mL/hr at 03/30/21 0920 New Bag at 03/30/21 0920   amLODipine (NORVASC) tablet 5 mg  5 mg Oral Daily Imagene Sheller S, DO   5 mg at 03/30/21 0916   atorvastatin (LIPITOR) tablet 80 mg  80 mg Oral Daily Leslee Home, DO   80 mg at 03/30/21 9379   ferrous sulfate tablet 325 mg  325 mg Oral Daily Imagene Sheller S, DO   325 mg at 03/30/21 0916   HYDROcodone-acetaminophen (NORCO/VICODIN) 5-325 MG per tablet 1 tablet  1 tablet Oral Q4H PRN Imagene Sheller S, DO   1 tablet at 03/29/21 2100   morphine 2 MG/ML injection 2 mg  2 mg Intravenous Q4H PRN Imagene Sheller S, DO       pantoprazole (PROTONIX) EC tablet 40 mg  40 mg Oral Daily Imagene Sheller S, DO   40 mg at 03/30/21 0240   rivaroxaban (XARELTO) tablet 20 mg  20 mg Oral Q supper Imagene Sheller S, DO   20 mg at 03/29/21 1614   sildenafil (REVATIO) tablet 20 mg  20 mg Oral TID Leslee Home,  DO   20 mg at 03/30/21 0354      Allergies: Allergies  Allergen Reactions   Sulfa Antibiotics Rash    Rash/hives    Other Rash      Past Medical History: Past Medical History:  Diagnosis Date   (HFpEF) heart failure with preserved ejection fraction (HCC)    CHF (congestive heart failure) (HCC)    Coronary artery disease    Hyperlipidemia    Hypertension    PAH (pulmonary artery hypertension) (HCC)    Permanent atrial fibrillation (HCC)    Pleural effusion      Past Surgical History: Past Surgical History:  Procedure Laterality Date   CARDIAC CATHETERIZATION     CHOLECYSTECTOMY     COLONOSCOPY WITH PROPOFOL N/A 08/23/2020   Procedure: COLONOSCOPY WITH PROPOFOL;  Surgeon: [name], MD;  Location: [facility] ENDOSCOPY;  Service: Gastroenterology;  Laterality: N/A;   CORONARY ANGIOPLASTY  06/2004   s/p stent placement @ [facility]   CORONARY ARTERY BYPASS GRAFT   04-24-2004   CABG x 3 [facility]   ESOPHAGOGASTRODUODENOSCOPY (EGD) WITH PROPOFOL N/A 08/23/2020   Procedure: ESOPHAGOGASTRODUODENOSCOPY (EGD) WITH PROPOFOL;  Surgeon: [name], MD;  Location: [facility];  Service: Gastroenterology;  Laterality: N/A;  Needs to be last case     Family History: Family History  Problem Relation Age of Onset   Heart disease Father    Heart disease Paternal Uncle    Heart attack Brother      Social History: Social History   Socioeconomic History   Marital status: Single    Spouse name: none   Number of children: Not on file   Years of education: high school   Highest education level: Not on file  Occupational History   Occupation: retired  Tobacco Use   Smoking status: Never   Smokeless tobacco: Never  Vaping Use   Vaping Use: Never used  Substance and Sexual Activity   Alcohol use: No   Drug use: No   Sexual activity: Not on file  Other Topics Concern   Not on file  Social History Narrative   Not on file   Social Determinants of Health   Financial Resource Strain: Not on file  Food Insecurity: No Food Insecurity   Worried About Running Out of Food in the Last Year: Never true   [place] in the Last Year: Never true  Transportation Needs: Unmet Transportation Needs   Lack of Transportation (Medical): Yes   Lack of Transportation (Non-Medical): No  Physical Activity: Unknown   Days of Exercise per Week: 0 days   Minutes of Exercise per Session: Not on file  Stress: Not on file  Social Connections: Unknown   Frequency of Communication with Friends and Family: More than three times a week   Frequency of Social Gatherings with Friends and Family: More than three times a week   Attends Religious Services: Patient refused   Marine scientist or Organizations: Patient refused   Attends Music therapist: Patient refused   Marital Status: Never married  Human resources officer Violence: Not At Risk   Fear of  Current or Ex-Partner: No   Emotionally Abused: No   Physically Abused: No   Sexually Abused: No     Review of Systems: ROS  Vital Signs: Blood pressure (!) 129/58, pulse (!) 55, temperature 97.8 F (36.6 C), temperature source Oral, resp. rate 16, height 5\' 11"  (1.803 m), weight 67.2 kg, SpO2 100 %.  Weight  trends: Filed Weights   03/29/21 0941 03/29/21 2247 03/30/21 0417  Weight: 74.4 kg 67.2 kg 67.2 kg    Physical Exam: General: NAD, sitting in chair  Head: Normocephalic, atraumatic. Moist oral mucosal membranes  Eyes: Anicteric  Lungs:  Clear to auscultation, normal effort  Heart: Regular rate and rhythm  Abdomen:  Soft, nontender  Extremities:  no peripheral edema.  Neurologic: Nonfocal, moving all four extremities  Skin: No lesions        Lab results: Basic Metabolic Panel: Recent Labs  Lab 03/29/21 2312 03/30/21 0547 03/30/21 1010  NA 125* 126* 128*  K 3.9 4.0 3.8  CL 90* 94* 93*  CO2 26 28 25   GLUCOSE 129* 111* 131*  BUN 21 21 20   CREATININE 1.17 1.00 0.91  CALCIUM 8.9 9.1 9.0    Liver Function Tests: Recent Labs  Lab 03/29/21 0944  AST 27  ALT 17  ALKPHOS 101  BILITOT 2.3*  PROT 8.3*  ALBUMIN 3.4*   Recent Labs  Lab 03/29/21 0944  LIPASE 52*   No results for input(s): AMMONIA in the last 168 hours.  CBC: Recent Labs  Lab 03/29/21 0944 03/30/21 0547  WBC 6.1 6.8  HGB 10.4* 9.7*  HCT 29.3* 27.6*  MCV 94.8 96.8  PLT 184 167    Cardiac Enzymes: No results for input(s): CKTOTAL, CKMB, CKMBINDEX, TROPONINI in the last 168 hours.  BNP: Invalid input(s): POCBNP  CBG: No results for input(s): GLUCAP in the last 168 hours.  Microbiology: Results for orders placed or performed during the hospital encounter of 03/29/21  Resp Panel by RT-PCR (Flu A&B, Covid) Nasopharyngeal Swab     Status: None   Collection Time: 03/29/21 11:14 AM   Specimen: Nasopharyngeal Swab; Nasopharyngeal(NP) swabs in vial transport medium  Result Value  Ref Range Status   SARS Coronavirus 2 by RT PCR NEGATIVE NEGATIVE Final    Comment: (NOTE) SARS-CoV-2 target nucleic acids are NOT DETECTED.  The SARS-CoV-2 RNA is generally detectable in upper respiratory specimens during the acute phase of infection. The lowest concentration of SARS-CoV-2 viral copies this assay can detect is 138 copies/mL. A negative result does not preclude SARS-Cov-2 infection and should not be used as the sole basis for treatment or other patient management decisions. A negative result may occur with  improper specimen collection/handling, submission of specimen other than nasopharyngeal swab, presence of viral mutation(s) within the areas targeted by this assay, and inadequate number of viral copies(<138 copies/mL). A negative result must be combined with clinical observations, patient history, and epidemiological information. The expected result is Negative.  Fact Sheet for Patients:  EntrepreneurPulse.com.au  Fact Sheet for Healthcare Providers:  IncredibleEmployment.be  This test is no t yet approved or cleared by the [place] FDA and  has been authorized for detection and/or diagnosis of SARS-CoV-2 by FDA under an Emergency Use Authorization (EUA). This EUA will remain  in effect (meaning this test can be used) for the duration of the COVID-19 declaration under Section 564(b)(1) of the Act, 21 U.S.C.section 360bbb-3(b)(1), unless the authorization is terminated  or revoked sooner.       Influenza A by PCR NEGATIVE NEGATIVE Final   Influenza B by PCR NEGATIVE NEGATIVE Final    Comment: (NOTE) The Xpert Xpress SARS-CoV-2/FLU/RSV plus assay is intended as an aid in the diagnosis of influenza from Nasopharyngeal swab specimens and should not be used as a sole basis for treatment. Nasal washings and aspirates are unacceptable for Xpert Xpress SARS-CoV-2/FLU/RSV testing.  Fact Sheet for  Patients: EntrepreneurPulse.com.au  Fact Sheet for Healthcare Providers: IncredibleEmployment.be  This test is not yet approved or cleared by the [place] FDA and has been authorized for detection and/or diagnosis of SARS-CoV-2 by FDA under an Emergency Use Authorization (EUA). This EUA will remain in effect (meaning this test can be used) for the duration of the COVID-19 declaration under Section 564(b)(1) of the Act, 21 U.S.C. section 360bbb-3(b)(1), unless the authorization is terminated or revoked.  Performed at [facility], [address] [place], [place] [address]     Coagulation Studies: No results for input(s): LABPROT, INR in the last 72 hours.  Urinalysis: No results for input(s): COLORURINE, LABSPEC, PHURINE, GLUCOSEU, HGBUR, BILIRUBINUR, KETONESUR, PROTEINUR, UROBILINOGEN, NITRITE, LEUKOCYTESUR in the last 72 hours.  Invalid input(s): APPERANCEUR    Imaging: DG Chest 2 View  Result Date: 03/29/2021 CLINICAL DATA:  Chest pain. EXAM: CHEST - 2 VIEW COMPARISON:  04/30/2020. FINDINGS: Prior CABG. Stable cardiomegaly. Mild pulmonary venous congestion. Mild bibasilar atelectasis. Mild right base infiltrate cannot be excluded. Small right pleural effusion. No pneumothorax. IMPRESSION: 1. Prior CABG. Stable cardiomegaly. Mild pulmonary venous congestion. 2.  Low lung volumes with bibasilar atelectasis. 3. Mild right base infiltrate cannot be excluded. Small right pleural effusion. Electronically Signed   By: Marcello Moores  Register   On: 03/29/2021 10:46   CT CHEST ABDOMEN PELVIS W CONTRAST  Result Date: 03/29/2021 CLINICAL DATA:  Abdominal pain, chest pain, shortness of breath EXAM: CT CHEST, ABDOMEN, AND PELVIS WITH CONTRAST TECHNIQUE: Multidetector CT imaging of the chest, abdomen and pelvis was performed following the standard protocol during bolus administration of intravenous contrast. CONTRAST:  145mL OMNIPAQUE IOHEXOL 300  MG/ML  SOLN COMPARISON:  03/23/2020 FINDINGS: CT CHEST FINDINGS Cardiovascular: Mild cardiomegaly with biatrial enlargement. Dilated central pulmonary arteries suggesting pulmonary hypertension. Mitral annulus calcifications. Extensive coronary calcifications. Previous CABG. Atheromatous thoracic aorta without aneurysm or dissection. Mediastinum/Nodes: No mass or adenopathy. Lungs/Pleura: Persistent small right pleural effusion with some pleural thickening. Mass-like peripheral consolidation/atelectasis at the right lung base has slightly increased, but given the curving of bronchovascular structures favor round atelectasis over neoplasm. Left lung is clear. Musculoskeletal: Sternotomy wires. No fracture or worrisome bone lesion. CT ABDOMEN PELVIS FINDINGS Hepatobiliary: No focal liver abnormality is seen. Status post cholecystectomy. No biliary dilatation. Pancreas: Unremarkable. No pancreatic ductal dilatation. Mild inflammatory/edematous change or in the pancreatic head is stable. No peripancreatic fluid collections. Spleen: Normal in size without focal abnormality. Adrenals/Urinary Tract: Adrenal glands unremarkable. Bilateral renal cysts, largest 4.1 cm from right lower pole, remainder much smaller. No urolithiasis or hydronephrosis. The urinary bladder is physiologically distended. Stomach/Bowel: The stomach is incompletely distended. The small bowel is nondilated. Normal appendix. Colon is nondilated, unremarkable. Vascular/Lymphatic: Scattered aortoiliac calcified plaque without aneurysm or evident stenosis. No abdominal or pelvic adenopathy. Reproductive: Mild prostate enlargement with central coarse calcifications. Other: The ascites seen previously has resolved.  No free air. Musculoskeletal: Degenerative disc disease L4-5. No fracture or worrisome bone lesion. IMPRESSION: 1. No acute findings. 2. Chronic small right pleural effusion with worsening masslike consolidation/atelectasis at the right lung  base, favor round atelectasis over neoplasm. Elective outpatient PET-CT may provide more definitive differentiation. 3. Coronary and Aortic Atherosclerosis (ICD10-170.0). Electronically Signed   By: Lucrezia Europe M.D.   On: 03/29/2021 12:30   US Abdomen Limited RUQ (LIVER/GB)  Result Date: 03/29/2021 CLINICAL DATA:  Right upper quadrant pain. EXAM: ULTRASOUND ABDOMEN LIMITED RIGHT UPPER QUADRANT COMPARISON:  CT abdomen pelvis 03/23/2020. FINDINGS: Gallbladder: Status  post cholecystectomy. No sonographic Murphy sign noted by sonographer. Common bile duct: Diameter: 5 mm. Liver: No focal lesion identified. Within normal limits in parenchymal echogenicity. Portal vein is patent on color Doppler imaging with normal direction of blood flow towards the liver. Other: At least trace right pleural effusion. No definite ascites within the right upper quadrant identified. IMPRESSION: 1. At least trace volume right pleural effusion. 2. Otherwise unremarkable right upper quadrant ultrasound in a patient status post cholecystectomy. Electronically Signed   By: Iven Finn M.D.   On: 03/29/2021 15:05     Assessment & Plan: Mr. Broadus Costilla is a 80 y.o.  male with CHF, hyperlipidemia, hypertension, atrial fibrillation, and CAD, who was admitted to [facility] on 03/29/2021 for Acute hyponatremia [E87.1] Hyponatremia [E87.1] Weakness [R53.1] Abdominal pain [R10.9] Atrial fibrillation, unspecified type ([place]) [I48.91]   Hyponatremia, appears chronically low. Likely due in part to diuretics Furosemide and Torsemide. Level 126 this am. Currently 128 later this morning. Receiving 0.9 NS @ 75 ml/hr. Tolerating meals. UOP 1.2L. Will hold diuretics. Instructed patient to drink when thirsty, 8 glasses may be excessive for this patient and diluting sodium levels  2. Hypertension 129/58 Currently on Amlodipine  No home medications    LOS: 1 Dorleen Kissel 7/21/202211:38 AM

## 2021-03-30 NOTE — Discharge Summary (Signed)
Physician Discharge Summary   Jordan Perkins  male DOB: [birth year]  QAS:[id number]  PCP: [name], MD  Admit date: 03/29/2021 Discharge date: 03/30/2021  Admitted From: home Disposition:  home Home Health: Yes CODE STATUS: Full code  Discharge Instructions     Amb referral to [facility]   Complete by: As directed    Diet - low sodium heart healthy   Complete by: As directed         Hospital Course:  For full details, please see H&P, progress notes, consult notes and ancillary notes.  Briefly,  Jordan Perkins is a 80 y.o. male with a past medical history of diastolic CHF, hypertension, coronary artery disease status post CABG, PAH, chronic A. Fib, peripheral arterial disease, bilateral carotid artery disease, dyslipidemia.  The patient presents to the emergency department with nausea, generalized weakness for the past 3 to 4 days.  He states he can barely stand.  He uses a cane at baseline to assist with ambulation.  Pt was admitted due to worsening hyponatremia.  Generalized weakness likely secondary to acute on chronic hyponatremia and deconditioning  --PT worked with pt and found pt safe to return home.  Chronic hyponatremia --Na 120 on presentation.  Past Na has been as low as 110's.  Likely due to diuretic use, and cirrhosis.  Pt may have been taking lasix, torsemide and aldactone at home.  Lasix d/c'ed at discharge. --urine and osm studies neg for SIADH --Na improved to 128 the next day with IVF --Nephrology was consulted on admission and advised pt to avoid excessive free water consumption. --Pt also advised on low-sodium diet.   Diastolic CHF, not in acute decompensation:  --home diuretic held while pt received IVF.  Discharged back on home torsemide and aldactone.   Essential hypertension --BP varied widely.   --home diuretic held while pt received IVF.  Discharged back on home torsemide and aldactone.   Incidental finding for masslike  consolidation:  CT imaging notes masslike consolidation favoring atelectasis but recommends outpatient PET CT scan to further evaluate for possibly neoplasm.   Elevated total bilirubin:  Has a previous cholecystectomy.  Right upper quadrant ultrasound unremarkable.   Chest pain, ACS ruled out He describes as burning.  EKG shows no acute ischemic changes.  Troponins are 13, 13.     Iron deficiency anemia:  Continue home ferrous sulfate 325 mg daily   GERD:  Continue home Protonix 40 mg daily   Chronic atrial fibrillation:  Continue home Xarelto 20 mg daily   Pulmonary arterial hypertension: Continue home sildenafil 20 mg 3 times daily   Coronary artery disease:  Continue home atorvastatin 80 mg daily   Cirrhosis:  No acute treatment.  No clinical signs of decompensation.     Peripheral vascular disease:  Denies any claudication.   Carotid artery disease, bilaterally: Followed outpatient with periodic ultrasounds   Discharge Diagnoses:  Active Problems:   Acute hyponatremia   30 Day Unplanned Readmission Risk Score    Flowsheet Row ED to Hosp-Admission (Current) from 03/29/2021 in [facility]  30 Day Unplanned Readmission Risk Score (%) 16.09 Filed at 03/30/2021 1200       This score is the patient's risk of an unplanned readmission within 30 days of being discharged (0 -100%). The score is based on dignosis, age, lab data, medications, orders, and past utilization.   Low:  0-14.9   Medium: 15-21.9   High: 22-29.9   Extreme: 30  and above         Discharge Instructions:  Allergies as of 03/30/2021       Reactions   Sulfa Antibiotics Rash   Rash/hives   Other Rash        Medication List     TAKE these medications    atorvastatin 80 MG tablet Commonly known as: LIPITOR TAKE 1 TABLET BY MOUTH EVERY DAY   ferrous sulfate 325 (65 FE) MG tablet TAKE 1 TABLET BY MOUTH EVERY DAY   pantoprazole 40 MG tablet Commonly  known as: PROTONIX Take 1 tablet (40 mg total) by mouth daily.   rivaroxaban 20 MG Tabs tablet Commonly known as: XARELTO Take 1 tablet (20 mg total) by mouth daily with supper.   sildenafil 20 MG tablet Commonly known as: REVATIO Take 1 tablet (20 mg total) by mouth 3 (three) times daily.   spironolactone 25 MG tablet Commonly known as: ALDACTONE Take 1 tablet (25 mg total) by mouth daily.   torsemide 20 MG tablet Commonly known as: DEMADEX Take 1 tablet (20 mg total) by mouth daily.         Follow-up Information     [name], MD Follow up in 1 week(s).   Specialty: Family Medicine Contact information: [place] [address] [phone]         [name], MD .   Specialty: Cardiology Contact information: [address] [place] [address] [phone]                 Allergies  Allergen Reactions   Sulfa Antibiotics Rash    Rash/hives    Other Rash     The results of significant diagnostics from this hospitalization (including imaging, microbiology, ancillary and laboratory) are listed below for reference.   Consultations:   Procedures/Studies: DG Chest 2 View  Result Date: 03/29/2021 CLINICAL DATA:  Chest pain. EXAM: CHEST - 2 VIEW COMPARISON:  04/30/2020. FINDINGS: Prior CABG. Stable cardiomegaly. Mild pulmonary venous congestion. Mild bibasilar atelectasis. Mild right base infiltrate cannot be excluded. Small right pleural effusion. No pneumothorax. IMPRESSION: 1. Prior CABG. Stable cardiomegaly. Mild pulmonary venous congestion. 2.  Low lung volumes with bibasilar atelectasis. 3. Mild right base infiltrate cannot be excluded. Small right pleural effusion. Electronically Signed   By: Marcello Moores  Register   On: 03/29/2021 10:46   CT CHEST ABDOMEN PELVIS W CONTRAST  Result Date: 03/29/2021 CLINICAL DATA:  Abdominal pain, chest pain, shortness of breath EXAM: CT CHEST, ABDOMEN, AND PELVIS  WITH CONTRAST TECHNIQUE: Multidetector CT imaging of the chest, abdomen and pelvis was performed following the standard protocol during bolus administration of intravenous contrast. CONTRAST:  175mL OMNIPAQUE IOHEXOL 300 MG/ML  SOLN COMPARISON:  03/23/2020 FINDINGS: CT CHEST FINDINGS Cardiovascular: Mild cardiomegaly with biatrial enlargement. Dilated central pulmonary arteries suggesting pulmonary hypertension. Mitral annulus calcifications. Extensive coronary calcifications. Previous CABG. Atheromatous thoracic aorta without aneurysm or dissection. Mediastinum/Nodes: No mass or adenopathy. Lungs/Pleura: Persistent small right pleural effusion with some pleural thickening. Mass-like peripheral consolidation/atelectasis at the right lung base has slightly increased, but given the curving of bronchovascular structures favor round atelectasis over neoplasm. Left lung is clear. Musculoskeletal: Sternotomy wires. No fracture or worrisome bone lesion. CT ABDOMEN PELVIS FINDINGS Hepatobiliary: No focal liver abnormality is seen. Status post cholecystectomy. No biliary dilatation. Pancreas: Unremarkable. No pancreatic ductal dilatation. Mild inflammatory/edematous change or in the pancreatic head is stable. No peripancreatic fluid collections. Spleen: Normal in size without focal abnormality.  Adrenals/Urinary Tract: Adrenal glands unremarkable. Bilateral renal cysts, largest 4.1 cm from right lower pole, remainder much smaller. No urolithiasis or hydronephrosis. The urinary bladder is physiologically distended. Stomach/Bowel: The stomach is incompletely distended. The small bowel is nondilated. Normal appendix. Colon is nondilated, unremarkable. Vascular/Lymphatic: Scattered aortoiliac calcified plaque without aneurysm or evident stenosis. No abdominal or pelvic adenopathy. Reproductive: Mild prostate enlargement with central coarse calcifications. Other: The ascites seen previously has resolved.  No free air.  Musculoskeletal: Degenerative disc disease L4-5. No fracture or worrisome bone lesion. IMPRESSION: 1. No acute findings. 2. Chronic small right pleural effusion with worsening masslike consolidation/atelectasis at the right lung base, favor round atelectasis over neoplasm. Elective outpatient PET-CT may provide more definitive differentiation. 3. Coronary and Aortic Atherosclerosis (ICD10-170.0). Electronically Signed   By: Lucrezia Europe M.D.   On: 03/29/2021 12:30   US Abdomen Limited RUQ (LIVER/GB)  Result Date: 03/29/2021 CLINICAL DATA:  Right upper quadrant pain. EXAM: ULTRASOUND ABDOMEN LIMITED RIGHT UPPER QUADRANT COMPARISON:  CT abdomen pelvis 03/23/2020. FINDINGS: Gallbladder: Status post cholecystectomy. No sonographic Murphy sign noted by sonographer. Common bile duct: Diameter: 5 mm. Liver: No focal lesion identified. Within normal limits in parenchymal echogenicity. Portal vein is patent on color Doppler imaging with normal direction of blood flow towards the liver. Other: At least trace right pleural effusion. No definite ascites within the right upper quadrant identified. IMPRESSION: 1. At least trace volume right pleural effusion. 2. Otherwise unremarkable right upper quadrant ultrasound in a patient status post cholecystectomy. Electronically Signed   By: Iven Finn M.D.   On: 03/29/2021 15:05      Labs: BNP (last 3 results) Recent Labs    04/14/20 1027 04/30/20 1912  BNP 283.3* 169.6*   Basic Metabolic Panel: Recent Labs  Lab 03/29/21 0944 03/29/21 2312 03/30/21 0547 03/30/21 1010  NA 120* 125* 126* 128*  K 3.8 3.9 4.0 3.8  CL 83* 90* 94* 93*  CO2 26 26 28 25   GLUCOSE 102* 129* 111* 131*  BUN 19 21 21 20   CREATININE 0.95 1.17 1.00 0.91  CALCIUM 9.2 8.9 9.1 9.0   Liver Function Tests: Recent Labs  Lab 03/29/21 0944  AST 27  ALT 17  ALKPHOS 101  BILITOT 2.3*  PROT 8.3*  ALBUMIN 3.4*   Recent Labs  Lab 03/29/21 0944  LIPASE 52*   No results for  input(s): AMMONIA in the last 168 hours. CBC: Recent Labs  Lab 03/29/21 0944 03/30/21 0547  WBC 6.1 6.8  HGB 10.4* 9.7*  HCT 29.3* 27.6*  MCV 94.8 96.8  PLT 184 167   Cardiac Enzymes: No results for input(s): CKTOTAL, CKMB, CKMBINDEX, TROPONINI in the last 168 hours. BNP: Invalid input(s): POCBNP CBG: No results for input(s): GLUCAP in the last 168 hours. D-Dimer No results for input(s): DDIMER in the last 72 hours. Hgb A1c No results for input(s): HGBA1C in the last 72 hours. Lipid Profile No results for input(s): CHOL, HDL, LDLCALC, TRIG, CHOLHDL, LDLDIRECT in the last 72 hours. Thyroid function studies No results for input(s): TSH, T4TOTAL, T3FREE, THYROIDAB in the last 72 hours.  Invalid input(s): FREET3 Anemia work up No results for input(s): VITAMINB12, FOLATE, FERRITIN, TIBC, IRON, RETICCTPCT in the last 72 hours. Urinalysis    Component Value Date/Time   COLORURINE YELLOW (A) 03/23/2020 0913   APPEARANCEUR CLEAR (A) 03/23/2020 0913   APPEARANCEUR Hazy 06/06/2013 0947   LABSPEC 1.011 03/23/2020 0913   LABSPEC 1.021 06/06/2013 0947   PHURINE 7.0 03/23/2020 0913  GLUCOSEU NEGATIVE 03/23/2020 0913   GLUCOSEU Negative 06/06/2013 0947   HGBUR NEGATIVE 03/23/2020 0913   BILIRUBINUR NEGATIVE 03/23/2020 0913   BILIRUBINUR Negative 06/06/2013 0947   KETONESUR NEGATIVE 03/23/2020 0913   PROTEINUR NEGATIVE 03/23/2020 0913   NITRITE NEGATIVE 03/23/2020 0913   LEUKOCYTESUR NEGATIVE 03/23/2020 0913   LEUKOCYTESUR Negative 06/06/2013 0947   Sepsis Labs Invalid input(s): PROCALCITONIN,  WBC,  LACTICIDVEN Microbiology Recent Results (from the past 240 hour(s))  Resp Panel by RT-PCR (Flu A&B, Covid) Nasopharyngeal Swab     Status: None   Collection Time: 03/29/21 11:14 AM   Specimen: Nasopharyngeal Swab; Nasopharyngeal(NP) swabs in vial transport medium  Result Value Ref Range Status   SARS Coronavirus 2 by RT PCR NEGATIVE NEGATIVE Final    Comment: (NOTE) SARS-CoV-2  target nucleic acids are NOT DETECTED.  The SARS-CoV-2 RNA is generally detectable in upper respiratory specimens during the acute phase of infection. The lowest concentration of SARS-CoV-2 viral copies this assay can detect is 138 copies/mL. A negative result does not preclude SARS-Cov-2 infection and should not be used as the sole basis for treatment or other patient management decisions. A negative result may occur with  improper specimen collection/handling, submission of specimen other than nasopharyngeal swab, presence of viral mutation(s) within the areas targeted by this assay, and inadequate number of viral copies(<138 copies/mL). A negative result must be combined with clinical observations, patient history, and epidemiological information. The expected result is Negative.  Fact Sheet for Patients:  EntrepreneurPulse.com.au  Fact Sheet for Healthcare Providers:  IncredibleEmployment.be  This test is no t yet approved or cleared by the [place] FDA and  has been authorized for detection and/or diagnosis of SARS-CoV-2 by FDA under an Emergency Use Authorization (EUA). This EUA will remain  in effect (meaning this test can be used) for the duration of the COVID-19 declaration under Section 564(b)(1) of the Act, 21 U.S.C.section 360bbb-3(b)(1), unless the authorization is terminated  or revoked sooner.       Influenza A by PCR NEGATIVE NEGATIVE Final   Influenza B by PCR NEGATIVE NEGATIVE Final    Comment: (NOTE) The Xpert Xpress SARS-CoV-2/FLU/RSV plus assay is intended as an aid in the diagnosis of influenza from Nasopharyngeal swab specimens and should not be used as a sole basis for treatment. Nasal washings and aspirates are unacceptable for Xpert Xpress SARS-CoV-2/FLU/RSV testing.  Fact Sheet for Patients: EntrepreneurPulse.com.au  Fact Sheet for Healthcare  Providers: IncredibleEmployment.be  This test is not yet approved or cleared by the [place] FDA and has been authorized for detection and/or diagnosis of SARS-CoV-2 by FDA under an Emergency Use Authorization (EUA). This EUA will remain in effect (meaning this test can be used) for the duration of the COVID-19 declaration under Section 564(b)(1) of the Act, 21 U.S.C. section 360bbb-3(b)(1), unless the authorization is terminated or revoked.  Performed at [facility], [address] [place], [place] [address]      Total time spend on discharging this patient, including the last patient exam, discussing the hospital stay, instructions for ongoing care as it relates to all pertinent caregivers, as well as preparing the medical discharge records, prescriptions, and/or referrals as applicable, is 50 minutes.    Enzo Bi, MD  [facility] 03/30/2021, 3:48 PM

## 2021-03-30 NOTE — Care Management CC44 (Signed)
Condition Code 44 Documentation Completed  Patient Details  Name: Jordan Perkins MRN: [id number] Date of Birth: 1941-01-31   Condition Code 44 given:  Yes Patient signature on Condition Code 44 notice:  Yes Documentation of 2 MD's agreement:  Yes Code 44 added to claim:  Yes    Anselm Pancoast, RN 03/30/2021, 4:33 PM

## 2021-03-30 NOTE — Care Management Obs Status (Signed)
[facility] NOTIFICATION   Patient Details  Name: Jordan Perkins MRN: [id number] Date of Birth: 06/22/1941   Medicare Observation Status Notification Given:  Yes    Anselm Pancoast, RN 03/30/2021, 4:33 PM

## 2021-03-30 NOTE — Progress Notes (Addendum)
Attempted to call patient's brother to inform about discharge. Brother aware of discharge & transportation coming for the patient.

## 2021-03-30 NOTE — Telephone Encounter (Signed)
Patient has been scheduled for 7/28 hospital follow up.

## 2021-03-31 ENCOUNTER — Telehealth: Payer: Self-pay

## 2021-03-31 DIAGNOSIS — I110 Hypertensive heart disease with heart failure: Secondary | ICD-10-CM | POA: Diagnosis not present

## 2021-03-31 DIAGNOSIS — I10 Essential (primary) hypertension: Secondary | ICD-10-CM | POA: Diagnosis not present

## 2021-03-31 DIAGNOSIS — I700 Atherosclerosis of aorta: Secondary | ICD-10-CM | POA: Diagnosis not present

## 2021-03-31 DIAGNOSIS — E871 Hypo-osmolality and hyponatremia: Secondary | ICD-10-CM | POA: Diagnosis not present

## 2021-03-31 DIAGNOSIS — R531 Weakness: Secondary | ICD-10-CM | POA: Diagnosis not present

## 2021-03-31 DIAGNOSIS — K219 Gastro-esophageal reflux disease without esophagitis: Secondary | ICD-10-CM | POA: Diagnosis not present

## 2021-03-31 DIAGNOSIS — I272 Pulmonary hypertension, unspecified: Secondary | ICD-10-CM | POA: Diagnosis not present

## 2021-03-31 DIAGNOSIS — D509 Iron deficiency anemia, unspecified: Secondary | ICD-10-CM | POA: Diagnosis not present

## 2021-03-31 DIAGNOSIS — I2510 Atherosclerotic heart disease of native coronary artery without angina pectoris: Secondary | ICD-10-CM | POA: Diagnosis not present

## 2021-03-31 DIAGNOSIS — I5030 Unspecified diastolic (congestive) heart failure: Secondary | ICD-10-CM | POA: Diagnosis not present

## 2021-03-31 DIAGNOSIS — R918 Other nonspecific abnormal finding of lung field: Secondary | ICD-10-CM | POA: Diagnosis not present

## 2021-03-31 NOTE — Telephone Encounter (Signed)
Transition Care Management Follow-up Telephone Call Date of discharge and from where: 03/30/21 from [facility]. How have you been since you were released from the hospital? I am doing better. Denies chest pain/burning, dizziness, confusion, extreme fatigue, N/V/D and all other symptoms. Any questions or concerns? No  Items Reviewed: Did the pt receive and understand the discharge instructions provided? Yes  Medications obtained and verified? Yes Other? No  Any new allergies since your discharge? No  Dietary orders reviewed? Yes Do you have support at home? Yes   Home Care and Equipment/Supplies: Were home health services ordered? no  Functional Questionnaire: (I = Independent and D = Dependent) ADLs: I  Bathing/Dressing- I  Meal Prep- I  Eating- I  Maintaining continence- I  Transferring/Ambulation- Cane  Managing Meds- I  Follow up appointments reviewed:  PCP Hospital f/u appt confirmed? Yes  Scheduled to see dr. Caryl Bis on 7/28 @ 3:30. [facility] f/u appt confirmed? Yes  Scheduled to see Cardiology on 7/26 @ 8:20. Are transportation arrangements needed? No  If their condition worsens, is the pt aware to call PCP or go to the Emergency Dept.? Yes Was the patient provided with contact information for the PCP's office or ED? Yes Was to pt encouraged to call back with questions or concerns? Yes

## 2021-03-31 NOTE — Telephone Encounter (Signed)
Reviewed

## 2021-04-04 ENCOUNTER — Ambulatory Visit: Payer: Medicare HMO | Admitting: Cardiovascular Disease

## 2021-04-06 ENCOUNTER — Ambulatory Visit: Payer: Medicare HMO | Admitting: Family Medicine

## 2021-04-06 ENCOUNTER — Telehealth: Payer: Self-pay | Admitting: Family Medicine

## 2021-04-06 DIAGNOSIS — I2510 Atherosclerotic heart disease of native coronary artery without angina pectoris: Secondary | ICD-10-CM | POA: Diagnosis not present

## 2021-04-06 DIAGNOSIS — R918 Other nonspecific abnormal finding of lung field: Secondary | ICD-10-CM | POA: Diagnosis not present

## 2021-04-06 DIAGNOSIS — I700 Atherosclerosis of aorta: Secondary | ICD-10-CM | POA: Diagnosis not present

## 2021-04-06 DIAGNOSIS — E871 Hypo-osmolality and hyponatremia: Secondary | ICD-10-CM | POA: Diagnosis not present

## 2021-04-06 DIAGNOSIS — I272 Pulmonary hypertension, unspecified: Secondary | ICD-10-CM | POA: Diagnosis not present

## 2021-04-06 DIAGNOSIS — D509 Iron deficiency anemia, unspecified: Secondary | ICD-10-CM | POA: Diagnosis not present

## 2021-04-06 DIAGNOSIS — I5030 Unspecified diastolic (congestive) heart failure: Secondary | ICD-10-CM | POA: Diagnosis not present

## 2021-04-06 DIAGNOSIS — K219 Gastro-esophageal reflux disease without esophagitis: Secondary | ICD-10-CM | POA: Diagnosis not present

## 2021-04-06 DIAGNOSIS — R531 Weakness: Secondary | ICD-10-CM | POA: Diagnosis not present

## 2021-04-06 DIAGNOSIS — I110 Hypertensive heart disease with heart failure: Secondary | ICD-10-CM | POA: Diagnosis not present

## 2021-04-06 NOTE — Telephone Encounter (Signed)
Noted.  I believe they only stopped his Lasix.  Please see if he is taking the torsemide and spironolactone.  He was supposed to have a follow-up visit at 3:30 today from his hospitalization.  If he does not show up this will need to be rescheduled.  He does not need to be no showed.

## 2021-04-06 NOTE — Telephone Encounter (Signed)
Home health called to report that pt BP is 150/66. Pt stated that he was taken off of BP medication   [place] [phone] with any further questions

## 2021-04-07 DIAGNOSIS — I2510 Atherosclerotic heart disease of native coronary artery without angina pectoris: Secondary | ICD-10-CM | POA: Diagnosis not present

## 2021-04-07 DIAGNOSIS — R918 Other nonspecific abnormal finding of lung field: Secondary | ICD-10-CM | POA: Diagnosis not present

## 2021-04-07 DIAGNOSIS — R531 Weakness: Secondary | ICD-10-CM | POA: Diagnosis not present

## 2021-04-07 DIAGNOSIS — I700 Atherosclerosis of aorta: Secondary | ICD-10-CM | POA: Diagnosis not present

## 2021-04-07 DIAGNOSIS — I110 Hypertensive heart disease with heart failure: Secondary | ICD-10-CM | POA: Diagnosis not present

## 2021-04-07 DIAGNOSIS — D509 Iron deficiency anemia, unspecified: Secondary | ICD-10-CM | POA: Diagnosis not present

## 2021-04-07 DIAGNOSIS — I5030 Unspecified diastolic (congestive) heart failure: Secondary | ICD-10-CM | POA: Diagnosis not present

## 2021-04-07 DIAGNOSIS — E871 Hypo-osmolality and hyponatremia: Secondary | ICD-10-CM | POA: Diagnosis not present

## 2021-04-07 DIAGNOSIS — K219 Gastro-esophageal reflux disease without esophagitis: Secondary | ICD-10-CM | POA: Diagnosis not present

## 2021-04-07 DIAGNOSIS — I272 Pulmonary hypertension, unspecified: Secondary | ICD-10-CM | POA: Diagnosis not present

## 2021-04-07 NOTE — Telephone Encounter (Signed)
Spoke with Home health. Nurse stated she will send the message over to get him rescheduled for a follow up to his nurse. They did confirm he is taking his torsemide and spirolactone.

## 2021-04-07 NOTE — Telephone Encounter (Signed)
Left message for [facility] health to call back.

## 2021-04-08 ENCOUNTER — Other Ambulatory Visit: Payer: Self-pay | Admitting: Family Medicine

## 2021-04-11 DIAGNOSIS — K219 Gastro-esophageal reflux disease without esophagitis: Secondary | ICD-10-CM | POA: Diagnosis not present

## 2021-04-11 DIAGNOSIS — I700 Atherosclerosis of aorta: Secondary | ICD-10-CM | POA: Diagnosis not present

## 2021-04-11 DIAGNOSIS — E871 Hypo-osmolality and hyponatremia: Secondary | ICD-10-CM | POA: Diagnosis not present

## 2021-04-11 DIAGNOSIS — I110 Hypertensive heart disease with heart failure: Secondary | ICD-10-CM | POA: Diagnosis not present

## 2021-04-11 DIAGNOSIS — D509 Iron deficiency anemia, unspecified: Secondary | ICD-10-CM | POA: Diagnosis not present

## 2021-04-11 DIAGNOSIS — R531 Weakness: Secondary | ICD-10-CM | POA: Diagnosis not present

## 2021-04-11 DIAGNOSIS — I272 Pulmonary hypertension, unspecified: Secondary | ICD-10-CM | POA: Diagnosis not present

## 2021-04-11 DIAGNOSIS — I5030 Unspecified diastolic (congestive) heart failure: Secondary | ICD-10-CM | POA: Diagnosis not present

## 2021-04-11 DIAGNOSIS — I2510 Atherosclerotic heart disease of native coronary artery without angina pectoris: Secondary | ICD-10-CM | POA: Diagnosis not present

## 2021-04-11 DIAGNOSIS — R918 Other nonspecific abnormal finding of lung field: Secondary | ICD-10-CM | POA: Diagnosis not present

## 2021-04-12 DIAGNOSIS — I272 Pulmonary hypertension, unspecified: Secondary | ICD-10-CM | POA: Diagnosis not present

## 2021-04-12 DIAGNOSIS — K219 Gastro-esophageal reflux disease without esophagitis: Secondary | ICD-10-CM | POA: Diagnosis not present

## 2021-04-12 DIAGNOSIS — I5030 Unspecified diastolic (congestive) heart failure: Secondary | ICD-10-CM | POA: Diagnosis not present

## 2021-04-12 DIAGNOSIS — I2510 Atherosclerotic heart disease of native coronary artery without angina pectoris: Secondary | ICD-10-CM | POA: Diagnosis not present

## 2021-04-12 DIAGNOSIS — R918 Other nonspecific abnormal finding of lung field: Secondary | ICD-10-CM | POA: Diagnosis not present

## 2021-04-12 DIAGNOSIS — I700 Atherosclerosis of aorta: Secondary | ICD-10-CM | POA: Diagnosis not present

## 2021-04-12 DIAGNOSIS — I110 Hypertensive heart disease with heart failure: Secondary | ICD-10-CM | POA: Diagnosis not present

## 2021-04-12 DIAGNOSIS — D509 Iron deficiency anemia, unspecified: Secondary | ICD-10-CM | POA: Diagnosis not present

## 2021-04-12 DIAGNOSIS — R531 Weakness: Secondary | ICD-10-CM | POA: Diagnosis not present

## 2021-04-12 DIAGNOSIS — E871 Hypo-osmolality and hyponatremia: Secondary | ICD-10-CM | POA: Diagnosis not present

## 2021-04-13 ENCOUNTER — Other Ambulatory Visit: Payer: Medicare HMO

## 2021-04-13 ENCOUNTER — Other Ambulatory Visit: Payer: Self-pay

## 2021-04-13 ENCOUNTER — Other Ambulatory Visit: Payer: Self-pay | Admitting: Family

## 2021-04-13 VITALS — BP 140/58 | HR 68 | Temp 97.7°F | Wt 164.0 lb

## 2021-04-13 DIAGNOSIS — Z515 Encounter for palliative care: Secondary | ICD-10-CM

## 2021-04-13 NOTE — Progress Notes (Signed)
COMMUNITY PALLIATIVE CARE SW NOTE  PATIENT NAME: Jordan Perkins DOB: [birth year] MRN: [id number]  PRIMARY CARE PROVIDER: [name], MD  RESPONSIBLE PARTY:  Acct ID - Guarantor Home Phone Work Phone Relationship Acct Type  [id number] - [phone]  Self P/F     [address], [address] [place], [place] [address]     PLAN OF CARE and INTERVENTIONS:             GOALS OF CARE/ ADVANCE CARE PLANNING:  Patient is now a FULL CODE.Marland Kitchen Patient's goal is to remain at home with brother.   2.         SOCIAL/EMOTIONAL/SPIRITUAL ASSESSMENT/ INTERVENTIONS:  SW and RN Almyra Free met with patient in patients home for follow up visit. Patient lives in a single story mobile home with his brother. Patient updated SW an Therapist, sports on medical condition.   Patient had recent hospital visit on 03/29/21 due to weakness resulting from low sodium level. Patient being followed by [facility].   Patient shares that he sleeps well and naps during the day. No loss in appetite, patient preps and cooks his own meals. No falls reported. Patient uses [facility] for ambulation. Patient has RW if needed.   RN reviewed medications and took vitals. Patient reminded to discontinue Lasix. Patient manages his own medications. SW rescheduled PCP appointment for Fri 8/12 _0 . SW set transportation with [employer] [phone].    SW discussed goals, reviewed care plan, provided emotional support, used active and reflective listening. No psychosocial needs at this time. Patients family and landlord assist with meal and transportation, and has the ability to use local transit if needed. Patient also receives MOW's. Palliative care will continue to monitor and assist with long term care planning as needed.   3.         PATIENT/CAREGIVER EDUCATION/ COPING:  Patient A&O x3, Pt able to answer all questions appropriately. Patient has normal motor skills. Patient has good reasoning ability. No S/S of depression or anxiety. Patients family is  supportive. Brother resides with him and niece lives close by and checks on him often. Patient shares that he enjoyed playing the banjo and guitar.   4.         PERSONAL EMERGENCY PLAN:  Patient will call 9-1-1 for emergencies.   5.         COMMUNITY RESOURCES COORDINATION/ HEALTH CARE NAVIGATION:  Patient manages care.   6.         FINANCIAL/LEGAL CONCERNS/INTERVENTIONS:  None.     SOCIAL HX:  Social History   Tobacco Use   Smoking status: Never   Smokeless tobacco: Never  Substance Use Topics   Alcohol use: No    CODE STATUS: Full code ADVANCED DIRECTIVES: Y MOST FORM COMPLETE:  Y HOSPICE EDUCATION PROVIDED: N  PPS: Patient is I with ADL's.    Time spent: 45 min      [place], [place]

## 2021-04-13 NOTE — Progress Notes (Signed)
PATIENT NAME: Jordan Perkins DOB: Sep 20, 1940 MRN: [id number]  PRIMARY CARE PROVIDER: [name], MD  RESPONSIBLE PARTY:  Acct ID - Guarantor Home Phone Work Phone Relationship Acct Type  [id number] - [phone]  Self P/F     [place], [address] [place], [place] [address]    PLAN OF CARE and INTERVENTIONS:               1.  GOALS OF CARE/ ADVANCE CARE PLANNING:  Remain home under the care of his brother and nieces.               2.  PATIENT/CAREGIVER EDUCATION:  Medications               4. PERSONAL EMERGENCY PLAN:  Activate 911 for emergencies.               5.  DISEASE STATUS:  Joint visit completed with [place], SW.  Patient seen in the ED last month.  Noted weakness with hyponatremia. Patient is now being followed by [facility] and is receiving therapy services.    Patient denies any issues with light-headedness, dizziness, nausea, vomiting or headaches.  No falls reported and patient continues to use assistive devices for safe ambulation.  Patient reports appetite remains good.  He continues to do light cooking, family brings meals in on the weekends and he receives MOW.  Weight is maintaining between 164-169 lbs.   Reviewed recent discharge summary and discussed medications.  Patient has still been taking his lasix 20 mg.  I have advised patient this needs to be stopped per discharge summary and PCP order.  Medication has been removed from daily medication regimen.  Patient was not aware of his appt with PCP on July 28th and missed this appointment.  Visit has been rescheduled for August 12 @ 115 pm.  Transportation has been arranged by Palliative Care SW.   Home phone is not working at this time.  I have contacted patient's niece Clarene Critchley to advise of this message left. Patient states she will be coming today to drop medications off.   Call back received from [place] and advised of the phone situation and our visit.  No other concerns are voiced.   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  Patient is being followed by Palliative Care monthly and PRN.  CODE STATUS: Full ADVANCED DIRECTIVES: Yes MOST FORM: No PPS: 50%   PHYSICAL EXAM:   VITALS: Today's Vitals   04/13/21 0911  BP: (!) 140/58  Pulse: 68  Temp: 97.7 F (36.5 C)  SpO2: 98%  Weight: 164 lb (74.4 kg)  PainSc: 0-No pain    LUNGS: clear to auscultation  CARDIAC: Cor irreg, irreg RRR}  EXTREMITIES: - edema SKIN: Skin color, texture, turgor normal. No rashes or lesions or normal  NEURO: positive for gait problems       Lorenza Burton, RN

## 2021-04-13 NOTE — Telephone Encounter (Signed)
Prescription refill request for Xarelto received.  Indication:afibafib Last office visit:gollan 12/06/20 Weight:74.6kg Age:77mScr:0.91 CrCl:71.7

## 2021-04-18 DIAGNOSIS — I2510 Atherosclerotic heart disease of native coronary artery without angina pectoris: Secondary | ICD-10-CM | POA: Diagnosis not present

## 2021-04-18 DIAGNOSIS — K219 Gastro-esophageal reflux disease without esophagitis: Secondary | ICD-10-CM | POA: Diagnosis not present

## 2021-04-18 DIAGNOSIS — R531 Weakness: Secondary | ICD-10-CM | POA: Diagnosis not present

## 2021-04-18 DIAGNOSIS — I110 Hypertensive heart disease with heart failure: Secondary | ICD-10-CM | POA: Diagnosis not present

## 2021-04-18 DIAGNOSIS — I5030 Unspecified diastolic (congestive) heart failure: Secondary | ICD-10-CM | POA: Diagnosis not present

## 2021-04-18 DIAGNOSIS — R918 Other nonspecific abnormal finding of lung field: Secondary | ICD-10-CM | POA: Diagnosis not present

## 2021-04-18 DIAGNOSIS — I272 Pulmonary hypertension, unspecified: Secondary | ICD-10-CM | POA: Diagnosis not present

## 2021-04-18 DIAGNOSIS — D509 Iron deficiency anemia, unspecified: Secondary | ICD-10-CM | POA: Diagnosis not present

## 2021-04-18 DIAGNOSIS — I700 Atherosclerosis of aorta: Secondary | ICD-10-CM | POA: Diagnosis not present

## 2021-04-18 DIAGNOSIS — E871 Hypo-osmolality and hyponatremia: Secondary | ICD-10-CM | POA: Diagnosis not present

## 2021-04-19 DIAGNOSIS — K219 Gastro-esophageal reflux disease without esophagitis: Secondary | ICD-10-CM | POA: Diagnosis not present

## 2021-04-19 DIAGNOSIS — R918 Other nonspecific abnormal finding of lung field: Secondary | ICD-10-CM | POA: Diagnosis not present

## 2021-04-19 DIAGNOSIS — I110 Hypertensive heart disease with heart failure: Secondary | ICD-10-CM | POA: Diagnosis not present

## 2021-04-19 DIAGNOSIS — I5030 Unspecified diastolic (congestive) heart failure: Secondary | ICD-10-CM | POA: Diagnosis not present

## 2021-04-19 DIAGNOSIS — I272 Pulmonary hypertension, unspecified: Secondary | ICD-10-CM | POA: Diagnosis not present

## 2021-04-19 DIAGNOSIS — E871 Hypo-osmolality and hyponatremia: Secondary | ICD-10-CM | POA: Diagnosis not present

## 2021-04-19 DIAGNOSIS — I2510 Atherosclerotic heart disease of native coronary artery without angina pectoris: Secondary | ICD-10-CM | POA: Diagnosis not present

## 2021-04-19 DIAGNOSIS — D509 Iron deficiency anemia, unspecified: Secondary | ICD-10-CM | POA: Diagnosis not present

## 2021-04-19 DIAGNOSIS — I700 Atherosclerosis of aorta: Secondary | ICD-10-CM | POA: Diagnosis not present

## 2021-04-19 DIAGNOSIS — R531 Weakness: Secondary | ICD-10-CM | POA: Diagnosis not present

## 2021-04-20 ENCOUNTER — Telehealth: Payer: Self-pay | Admitting: Family Medicine

## 2021-04-20 NOTE — Telephone Encounter (Signed)
   Jordan Perkins DOB: 1941-07-20 MRN: [id number]   RIDER WAIVER AND RELEASE OF LIABILITY  For purposes of improving physical access to our facilities, [facility] is pleased to partner with third parties to provide [facility] patients or other authorized individuals the option of convenient, on-demand ground transportation services (the [place]") through use of the technology service that enables users to request on-demand ground transportation from independent third-party providers.  By opting to use and accept these [employer], I, the undersigned, hereby agree on behalf of myself, and on behalf of any minor child using the Government social research officer for whom I am the parent or legal guardian, as follows:  Government social research officer provided to me are provided by independent third-party transportation providers who are not [employer] or employees and who are unaffiliated with [employer]. [facility] is neither a transportation carrier nor a common or public carrier. [facility] has no control over the quality or safety of the transportation that occurs as a result of the [employer]. [facility] cannot guarantee that any third-party transportation provider will complete any arranged transportation service. [facility] makes no representation, warranty, or guarantee regarding the reliability, timeliness, quality, safety, suitability, or availability of any of the Transport Services or that they will be error free. I fully understand that traveling by vehicle involves risks and dangers of serious bodily injury, including permanent disability, paralysis, and death. I agree, on behalf of myself and on behalf of any minor child using the Transport Services for whom I am the parent or legal guardian, that the entire risk arising out of my use of the [employer] remains solely with me, to the maximum extent permitted under applicable law. The [employer] are provided  "as is" and "as available." [facility] disclaims all representations and warranties, express, implied or statutory, not expressly set out in these terms, including the implied warranties of merchantability and fitness for a particular purpose. I hereby waive and release [facility], its agents, employees, officers, directors, representatives, insurers, attorneys, assigns, successors, subsidiaries, and affiliates from any and all past, present, or future claims, demands, liabilities, actions, causes of action, or suits of any kind directly or indirectly arising from acceptance and use of the [employer]. I further waive and release [facility] and its affiliates from all present and future liability and responsibility for any injury or death to persons or damages to property caused by or related to the use of the [employer]. I have read this Waiver and Release of Liability, and I understand the terms used in it and their legal significance. This Waiver is freely and voluntarily given with the understanding that my right (as well as the right of any minor child for whom I am the parent or legal guardian using the [employer]) to legal recourse against [facility] in connection with the [employer] is knowingly surrendered in return for use of these services.   I attest that I read the consent document to Jordan Perkins, gave Jordan Perkins the opportunity to ask questions and answered the questions asked (if any). I affirm that Jordan Perkins then provided consent for he's participation in this program.     Jordan Perkins

## 2021-04-21 ENCOUNTER — Other Ambulatory Visit: Payer: Self-pay

## 2021-04-21 ENCOUNTER — Ambulatory Visit (INDEPENDENT_AMBULATORY_CARE_PROVIDER_SITE_OTHER): Payer: Medicare HMO | Admitting: Family Medicine

## 2021-04-21 ENCOUNTER — Telehealth: Payer: Self-pay | Admitting: *Deleted

## 2021-04-21 ENCOUNTER — Inpatient Hospital Stay
Admission: EM | Admit: 2021-04-21 | Discharge: 2021-04-23 | DRG: 641 | Disposition: A | Discharge status: Home / Self Care | Payer: Medicare HMO | Attending: Internal Medicine | Admitting: Internal Medicine

## 2021-04-21 VITALS — BP 134/76 | HR 60 | Temp 97.6°F | Resp 16 | Ht 71.0 in | Wt 161.6 lb

## 2021-04-21 DIAGNOSIS — I5032 Chronic diastolic (congestive) heart failure: Secondary | ICD-10-CM | POA: Diagnosis not present

## 2021-04-21 DIAGNOSIS — Z882 Allergy status to sulfonamides status: Secondary | ICD-10-CM

## 2021-04-21 DIAGNOSIS — I2721 Secondary pulmonary arterial hypertension: Secondary | ICD-10-CM | POA: Diagnosis present

## 2021-04-21 DIAGNOSIS — N1831 Chronic kidney disease, stage 3a: Secondary | ICD-10-CM | POA: Diagnosis not present

## 2021-04-21 DIAGNOSIS — D649 Anemia, unspecified: Secondary | ICD-10-CM | POA: Diagnosis present

## 2021-04-21 DIAGNOSIS — I4821 Permanent atrial fibrillation: Secondary | ICD-10-CM | POA: Diagnosis not present

## 2021-04-21 DIAGNOSIS — R918 Other nonspecific abnormal finding of lung field: Secondary | ICD-10-CM | POA: Diagnosis not present

## 2021-04-21 DIAGNOSIS — R531 Weakness: Secondary | ICD-10-CM | POA: Diagnosis not present

## 2021-04-21 DIAGNOSIS — E871 Hypo-osmolality and hyponatremia: Secondary | ICD-10-CM

## 2021-04-21 DIAGNOSIS — I4891 Unspecified atrial fibrillation: Secondary | ICD-10-CM | POA: Diagnosis present

## 2021-04-21 DIAGNOSIS — I272 Pulmonary hypertension, unspecified: Secondary | ICD-10-CM

## 2021-04-21 DIAGNOSIS — E785 Hyperlipidemia, unspecified: Secondary | ICD-10-CM

## 2021-04-21 DIAGNOSIS — Z20822 Contact with and (suspected) exposure to covid-19: Secondary | ICD-10-CM | POA: Diagnosis present

## 2021-04-21 DIAGNOSIS — D638 Anemia in other chronic diseases classified elsewhere: Secondary | ICD-10-CM | POA: Diagnosis present

## 2021-04-21 DIAGNOSIS — K7460 Unspecified cirrhosis of liver: Secondary | ICD-10-CM | POA: Diagnosis present

## 2021-04-21 DIAGNOSIS — Z7901 Long term (current) use of anticoagulants: Secondary | ICD-10-CM

## 2021-04-21 DIAGNOSIS — I25118 Atherosclerotic heart disease of native coronary artery with other forms of angina pectoris: Secondary | ICD-10-CM | POA: Diagnosis not present

## 2021-04-21 DIAGNOSIS — I10 Essential (primary) hypertension: Secondary | ICD-10-CM

## 2021-04-21 DIAGNOSIS — R7303 Prediabetes: Secondary | ICD-10-CM | POA: Diagnosis not present

## 2021-04-21 DIAGNOSIS — Z951 Presence of aortocoronary bypass graft: Secondary | ICD-10-CM

## 2021-04-21 DIAGNOSIS — Z8249 Family history of ischemic heart disease and other diseases of the circulatory system: Secondary | ICD-10-CM

## 2021-04-21 DIAGNOSIS — I110 Hypertensive heart disease with heart failure: Secondary | ICD-10-CM | POA: Diagnosis not present

## 2021-04-21 DIAGNOSIS — I2510 Atherosclerotic heart disease of native coronary artery without angina pectoris: Secondary | ICD-10-CM | POA: Diagnosis present

## 2021-04-21 DIAGNOSIS — I5030 Unspecified diastolic (congestive) heart failure: Secondary | ICD-10-CM | POA: Diagnosis not present

## 2021-04-21 DIAGNOSIS — Z743 Need for continuous supervision: Secondary | ICD-10-CM | POA: Diagnosis not present

## 2021-04-21 DIAGNOSIS — K5900 Constipation, unspecified: Secondary | ICD-10-CM | POA: Diagnosis present

## 2021-04-21 DIAGNOSIS — K219 Gastro-esophageal reflux disease without esophagitis: Secondary | ICD-10-CM | POA: Diagnosis not present

## 2021-04-21 DIAGNOSIS — D509 Iron deficiency anemia, unspecified: Secondary | ICD-10-CM | POA: Diagnosis not present

## 2021-04-21 DIAGNOSIS — I700 Atherosclerosis of aorta: Secondary | ICD-10-CM | POA: Diagnosis not present

## 2021-04-21 DIAGNOSIS — Z79899 Other long term (current) drug therapy: Secondary | ICD-10-CM

## 2021-04-21 LAB — COMPREHENSIVE METABOLIC PANEL
ALT: 15 U/L (ref 0–53)
AST: 23 U/L (ref 0–37)
Albumin: 3.9 g/dL (ref 3.5–5.2)
Alkaline Phosphatase: 101 U/L (ref 39–117)
BUN: 34 mg/dL — ABNORMAL HIGH (ref 6–23)
CO2: 28 mEq/L (ref 19–32)
Calcium: 9.4 mg/dL (ref 8.4–10.5)
Chloride: 85 mEq/L — ABNORMAL LOW (ref 96–112)
Creatinine, Ser: 1.09 mg/dL (ref 0.40–1.50)
GFR: 65.36 mL/min (ref >60.00)
Glucose, Bld: 104 mg/dL — ABNORMAL HIGH (ref 70–99)
Potassium: 4.3 mEq/L (ref 3.5–5.1)
Sodium: 120 mEq/L — CL (ref 135–145)
Total Bilirubin: 1.4 mg/dL — ABNORMAL HIGH (ref 0.2–1.2)
Total Protein: 8.1 g/dL (ref 6.0–8.3)

## 2021-04-21 LAB — RESP PANEL BY RT-PCR (FLU A&B, COVID) ARPGX2
Influenza A by PCR: NEGATIVE
Influenza B by PCR: NEGATIVE
SARS Coronavirus 2 by RT PCR: NEGATIVE

## 2021-04-21 LAB — URINALYSIS, COMPLETE (UACMP) WITH MICROSCOPIC
Bilirubin Urine: NEGATIVE
Glucose, UA: NEGATIVE mg/dL
Ketones, ur: NEGATIVE mg/dL
Nitrite: NEGATIVE
Protein, ur: 30 mg/dL — AB
Specific Gravity, Urine: 1.006 (ref 1.005–1.030)
pH: 7 (ref 5.0–8.0)

## 2021-04-21 LAB — CBC
HCT: 27.5 % — ABNORMAL LOW (ref 39.0–52.0)
Hemoglobin: 9.8 g/dL — ABNORMAL LOW (ref 13.0–17.0)
MCH: 34.9 pg — ABNORMAL HIGH (ref 26.0–34.0)
MCHC: 35.6 g/dL (ref 30.0–36.0)
MCV: 97.9 fL (ref 80.0–100.0)
Platelets: 161 10*3/uL (ref 150–400)
RBC: 2.81 MIL/uL — ABNORMAL LOW (ref 4.22–5.81)
RDW: 14.5 % (ref 11.5–15.5)
WBC: 6.5 10*3/uL (ref 4.0–10.5)
nRBC: 0 % (ref 0.0–0.2)

## 2021-04-21 LAB — BASIC METABOLIC PANEL
Anion gap: 9 (ref 5–15)
BUN: 35 mg/dL — ABNORMAL HIGH (ref 8–23)
CO2: 26 mmol/L (ref 22–32)
Calcium: 9.3 mg/dL (ref 8.9–10.3)
Chloride: 87 mmol/L — ABNORMAL LOW (ref 98–111)
Creatinine, Ser: 1.08 mg/dL (ref 0.61–1.24)
GFR, Estimated: >60 mL/min (ref >60)
Glucose, Bld: 165 mg/dL — ABNORMAL HIGH (ref 70–99)
Potassium: 4.4 mmol/L (ref 3.5–5.1)
Sodium: 122 mmol/L — ABNORMAL LOW (ref 135–145)

## 2021-04-21 LAB — LIPID PANEL
Cholesterol: 113 mg/dL (ref 0–200)
HDL: 58.7 mg/dL (ref >39.00)
LDL Cholesterol: 45 mg/dL (ref 0–99)
NonHDL: 54.61
Total CHOL/HDL Ratio: 2
Triglycerides: 46 mg/dL (ref 0.0–149.0)
VLDL: 9.2 mg/dL (ref 0.0–40.0)

## 2021-04-21 LAB — HEMOGLOBIN A1C: Hgb A1c MFr Bld: 6 % (ref 4.6–6.5)

## 2021-04-21 LAB — MICROALBUMIN / CREATININE URINE RATIO
Creatinine,U: 20.2 mg/dL
Microalb Creat Ratio: 52.2 mg/g — ABNORMAL HIGH (ref 0.0–30.0)
Microalb, Ur: 10.6 mg/dL — ABNORMAL HIGH (ref 0.0–1.9)

## 2021-04-21 LAB — MAGNESIUM: Magnesium: 2 mg/dL (ref 1.7–2.4)

## 2021-04-21 MED ORDER — SODIUM CHLORIDE 0.9 % IV BOLUS
500.0000 mL | Freq: Once | INTRAVENOUS | Status: AC
  Administered 2021-04-21: 500 mL via INTRAVENOUS

## 2021-04-21 NOTE — Progress Notes (Signed)
Marikay Alar, MD Phone: [phone]  Jordan Perkins is a 80 y.o. male who presents today for follow-up.  Hyponatremia: Patient was recently hospitalized for acute worsening of his chronic hyponatremia.  He presented with generalized weakness, nausea, and abdominal pain.  His hyponatremia was felt to be related to diuretic use and his cirrhosis.  He was given IV fluids and nephrology was consulted.  Nephrology advised to avoid excessive free water consumption.  Lasix was discontinued.  He reports the weakness has improved significantly.  He said no confusion.  No dizziness.  He is urinating normally.  No chest pain or shortness of breath.  No ankle edema.  Pulmonary hypertension: Appears he is most not sildenafil though he notes he has not received that prescription.  Cardiology placed him on this back in March.  Hypertension: Notes his blood pressure is typically 130/60.  He is currently taking torsemide 20 mg once daily and spironolactone 25 mg daily.  Social History   Tobacco Use  Smoking Status Never  Smokeless Tobacco Never    Current Outpatient Medications on File Prior to Visit  Medication Sig Dispense Refill   atorvastatin (LIPITOR) 80 MG tablet TAKE 1 TABLET BY MOUTH EVERY DAY 90 tablet 0   ferrous sulfate 325 (65 FE) MG tablet TAKE 1 TABLET BY MOUTH EVERY DAY 90 tablet 1   pantoprazole (PROTONIX) 40 MG tablet Take 1 tablet (40 mg total) by mouth daily. 60 tablet 11   sildenafil (REVATIO) 20 MG tablet Take 1 tablet (20 mg total) by mouth 3 (three) times daily. (Patient not taking: Reported on 04/21/2021) 90 tablet 7   spironolactone (ALDACTONE) 25 MG tablet Take 1 tablet (25 mg total) by mouth daily. 90 tablet 3   torsemide (DEMADEX) 20 MG tablet Take 1 tablet (20 mg total) by mouth daily. 90 tablet 3   XARELTO 20 MG TABS tablet TAKE 1 TABLET BY MOUTH DAILY WITH SUPPER. 90 tablet 1   No current facility-administered medications on file prior to visit.     ROS see  history of present illness  Objective  Physical Exam Vitals:   04/21/21 1310  BP: 134/76  Pulse: 60  Resp: 16  Temp: 97.6 F (36.4 C)  SpO2: 98%    BP Readings from Last 3 Encounters:  04/21/21 134/76  04/13/21 (!) 140/58  03/30/21 (!) 121/51   Wt Readings from Last 3 Encounters:  04/21/21 161 lb 9.6 oz (73.3 kg)  04/13/21 164 lb (74.4 kg)  03/30/21 148 lb 2.4 oz (67.2 kg)    Physical Exam Constitutional:      General: He is not in acute distress.    Appearance: He is not diaphoretic.  Cardiovascular:     Rate and Rhythm: Normal rate and regular rhythm.     Heart sounds: Normal heart sounds.  Pulmonary:     Effort: Pulmonary effort is normal.     Breath sounds: Normal breath sounds.  Musculoskeletal:     Right lower leg: No edema.     Left lower leg: No edema.  Skin:    General: Skin is warm and dry.  Neurological:     Mental Status: He is alert.     Comments: 5/5 strength in bilateral biceps, triceps, grip, quads, hamstrings, plantar and dorsiflexion     Assessment/Plan: Please see individual problem list.  Problem List Items Addressed This Visit     Acute hyponatremia - Primary   Relevant Orders   Comp Met (CMET)   Essential hypertension  Adequate control.  He will continue torsemide and spironolactone.      Hyperlipidemia    Check lipid panel.  Continue atorvastatin 80 mg once daily.      Relevant Orders   Comp Met (CMET)   Lipid panel   Hyponatremia    The patient had acute on chronic worsening of his hyponatremia.  This improved prior to discharge.  He is no longer takes Lasix.  He does drink 6 to 7 glasses of water daily.  Discussed rechecking his sodium level and if needed we can have him reduce his free water intake.      Relevant Orders   Comp Met (CMET)   Prediabetes    Check A1c.      Relevant Orders   HgB A1c   Urine Microalbumin w/creat. ratio   Pulmonary hypertension, unspecified ([place])    Discussed that he should be taking  sildenafil.  We will check with his pharmacy to determine if they have a prescription on file.      Stage 3a chronic kidney disease (HCC)   Relevant Orders   Urine Microalbumin w/creat. ratio   Return in about 3 months (around 07/22/2021).  This visit occurred during the SARS-CoV-2 public health emergency.  Safety protocols were in place, including screening questions prior to the visit, additional usage of staff PPE, and extensive cleaning of exam room while observing appropriate contact time as indicated for disinfecting solutions.    [name], MD [place]

## 2021-04-21 NOTE — ED Triage Notes (Signed)
Pt comes into the ED via [facility] from home c/o hyponatremia that's been going on for over a year.  Pt only c/o weakness as a symptom.    179/66 84 HR 100% Ra CBg 152 NSR

## 2021-04-21 NOTE — Telephone Encounter (Signed)
I called patient & he had no transportation to hospital. I asked if okay to call [facility]. I have called them & they are on their way to get patient. I called patient back & gave him EMS instruction to not eat or drink until they get there & to make sure that door was unlocked. Pt verbalized understanding.

## 2021-04-21 NOTE — Assessment & Plan Note (Signed)
Discussed that he should be taking sildenafil.  We will check with his pharmacy to determine if they have a prescription on file.

## 2021-04-21 NOTE — ED Provider Notes (Addendum)
[facility] Emergency Department Provider Note  ____________________________________________   Event Date/Time   First MD Initiated Contact with Patient 04/21/21 2219     (approximate)  I have reviewed the triage vital signs and the nursing notes.   HISTORY  Chief Complaint Abnormal Labs (Sodium)   HPI Jordan Perkins is a 80 y.o. male  with a past medical history of diastolic CHF, hypertension, coronary artery disease status post CABG, PAH, chronic A. Fib, peripheral arterial disease, bilateral carotid artery disease, dyslipidemia and recent admission 7/20-7/21 for hyponatremia with an NA of 120 thought to be secondary to overdiuresis who presents to the ED for assessment of hyponatremia obtain outpatient labs earlier today.  Patient states he has been feeling very weak over the last 2 or 3 days.  He has not had any falls or injuries but states he only takes a couple steps before feeling too weak to continue.  States he has been taking all of his medicines as directed.  He is no longer taking Lasix and is now on torsemide.  He denies any headache, earache, sore throat, cough, chest pain, Donnell pain, nausea, vomiting, diarrhea, dysuria, rash or any other clear acute sick symptoms.  States this feels similar to how he felt last time he was hospitalized for low sodium.         Past Medical History:  Diagnosis Date   (HFpEF) heart failure with preserved ejection fraction (HCC)    CHF (congestive heart failure) (HCC)    Coronary artery disease    Hyperlipidemia    Hypertension    PAH (pulmonary artery hypertension) (HCC)    Permanent atrial fibrillation (HCC)    Pleural effusion     Patient Active Problem List   Diagnosis Date Noted   Acute hyponatremia 03/29/2021   Rash 03/08/2021   History of cirrhosis of liver    Ankle pain 07/01/2020   Hyposmolality and/or hyponatremia 05/13/2020   Chronic kidney disease 05/11/2020   Protein-calorie  malnutrition, severe 05/04/2020   DNR (do not resuscitate)    Occult blood positive stool 04/15/2020   Chronic diastolic CHF (congestive heart failure) ([place]) 03/23/2020   Coronary artery disease    Stage 3a chronic kidney disease ([place]) 07/12/2019   Prediabetes 06/12/2019   Macrocytic anemia 03/10/2019   Pulmonary hypertension, unspecified (HCC)    Pleural effusion    CHF (congestive heart failure) ([place]) 02/16/2019   PAD (peripheral artery disease) ([place]) 01/02/2019   Bilateral carotid artery disease ([place]) 01/02/2019   Memory difficulty 11/05/2016   Seborrheic keratoses 11/05/2016   Hyponatremia 10/29/2016   CAD (coronary artery disease) 06/23/2013   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   Atrial fibrillation ([place]) 06/23/2013   Hyperlipidemia 06/23/2013   Essential hypertension 06/23/2013   Gallstone pancreatitis 06/23/2013    Past Surgical History:  Procedure Laterality Date   CARDIAC CATHETERIZATION     CHOLECYSTECTOMY     COLONOSCOPY WITH PROPOFOL N/A 08/23/2020   Procedure: COLONOSCOPY WITH PROPOFOL;  Surgeon: [name], MD;  Location: [facility] ENDOSCOPY;  Service: Gastroenterology;  Laterality: N/A;   CORONARY ANGIOPLASTY  06/2004   s/p stent placement @ [facility]   CORONARY ARTERY BYPASS GRAFT  04-24-2004   CABG x 3 [facility]   ESOPHAGOGASTRODUODENOSCOPY (EGD) WITH PROPOFOL N/A 08/23/2020   Procedure: ESOPHAGOGASTRODUODENOSCOPY (EGD) WITH PROPOFOL;  Surgeon: [name], MD;  Location: [facility];  Service: Gastroenterology;  Laterality: N/A;  Needs to be last case    Prior to Admission  medications   Medication Sig Start Date End Date Taking? Authorizing Provider  atorvastatin (LIPITOR) 80 MG tablet TAKE 1 TABLET BY MOUTH EVERY DAY 03/23/21   [name], MD  ferrous sulfate 325 (65 FE) MG tablet TAKE 1 TABLET BY MOUTH EVERY DAY 12/13/20   [name], MD  pantoprazole (PROTONIX) 40 MG tablet Take 1 tablet (40 mg total) by mouth daily.  07/08/20   Loel Dubonnet, NP  sildenafil (REVATIO) 20 MG tablet Take 1 tablet (20 mg total) by mouth 3 (three) times daily. Patient not taking: Reported on 04/21/2021 12/06/20   [name], MD  spironolactone (ALDACTONE) 25 MG tablet Take 1 tablet (25 mg total) by mouth daily. 07/04/20   [name], MD  torsemide (DEMADEX) 20 MG tablet Take 1 tablet (20 mg total) by mouth daily. 07/25/20   Loel Dubonnet, NP  XARELTO 20 MG TABS tablet TAKE 1 TABLET BY MOUTH DAILY WITH SUPPER. 04/13/21   [name], MD    Allergies Sulfa antibiotics and Other  Family History  Problem Relation Age of Onset   Heart disease Father    Heart disease Paternal Uncle    Heart attack Brother     Social History Social History   Tobacco Use   Smoking status: Never   Smokeless tobacco: Never  Vaping Use   Vaping Use: Never used  Substance Use Topics   Alcohol use: No   Drug use: No    Review of Systems  ROS    ____________________________________________   PHYSICAL EXAM:  VITAL SIGNS: ED Triage Vitals  Enc Vitals Group     BP 04/21/21 1748 (!) 175/66     Pulse Rate 04/21/21 1748 67     Resp 04/21/21 1748 16     Temp 04/21/21 1748 98.7 F (37.1 C)     Temp Source 04/21/21 1748 Oral     SpO2 04/21/21 1748 94 %     Weight 04/21/21 1749 160 lb 15 oz (73 kg)     Height 04/21/21 1749 '5\' 11"'$  (1.803 m)     Head Circumference --      Peak Flow --      Pain Score 04/21/21 1748 0     Pain Loc --      Pain Edu? --      Excl. in [place]? --    Vitals:   04/21/21 2038 04/21/21 2215  BP: (!) 188/71 (!) 157/72  Pulse: 62 63  Resp: 16 14  Temp:    SpO2: 100% 99%   Physical Exam Vitals and nursing note reviewed.  Constitutional:      Appearance: He is well-developed.  HENT:     Head: Normocephalic and atraumatic.     Right Ear: External ear normal.     Left Ear: External ear normal.     Nose: Nose normal.     Mouth/Throat:     Mouth: Mucous membranes are dry.  Eyes:      Conjunctiva/sclera: Conjunctivae normal.  Cardiovascular:     Rate and Rhythm: Normal rate and regular rhythm.     Pulses: Normal pulses.     Heart sounds: No murmur heard. Pulmonary:     Effort: Pulmonary effort is normal. No respiratory distress.     Breath sounds: Normal breath sounds.  Abdominal:     Palpations: Abdomen is soft.     Tenderness: There is no abdominal tenderness. There is no right CVA tenderness or left CVA tenderness.  Musculoskeletal:     Cervical back: Neck supple.  Skin:    General: Skin is warm and dry.     Capillary Refill: Capillary refill takes less than 2 seconds.  Neurological:     Mental Status: He is alert and oriented to person, place, and time.  Psychiatric:        Mood and Affect: Mood normal.    Patient is oriented x3.  Cranial nerves II through XII grossly intact.  He is symmetric strength in his upper and extremities.  Sensation is intact light touch of all extremities ____________________________________________   LABS (all labs ordered are listed, but only abnormal results are displayed)  Labs Reviewed  BASIC METABOLIC PANEL - Abnormal; Notable for the following components:      Result Value   Sodium 122 (*)    Chloride 87 (*)    Glucose, Bld 165 (*)    BUN 35 (*)    All other components within normal limits  CBC - Abnormal; Notable for the following components:   RBC 2.81 (*)    Hemoglobin 9.8 (*)    HCT 27.5 (*)    MCH 34.9 (*)    All other components within normal limits  URINALYSIS, COMPLETE (UACMP) WITH MICROSCOPIC - Abnormal; Notable for the following components:   Color, Urine STRAW (*)    APPearance CLEAR (*)    Hgb urine dipstick SMALL (*)    Protein, ur 30 (*)    Leukocytes,Ua TRACE (*)    Bacteria, UA FEW (*)    All other components within normal limits  RESP PANEL BY RT-PCR (FLU A&B, COVID) ARPGX2  MAGNESIUM   ____________________________________________  EKG  A. fib with a rate of 69, normal axis,  unremarkable intervals without clearance of acute ischemia or significant arrhythmia. ____________________________________________  RADIOLOGY  ED MD interpretation:   Official radiology report(s): No results found.  ____________________________________________   PROCEDURES  Procedure(s) performed (including Critical Care):  .Critical Care  Date/Time: 04/22/2021 1:14 AM Performed by: Lucrezia Starch, MD Authorized by: Lucrezia Starch, MD   Critical care provider statement:    Critical care time (minutes):  45   Critical care was necessary to treat or prevent imminent or life-threatening deterioration of the following conditions:  Metabolic crisis   Critical care was time spent personally by me on the following activities:  Discussions with consultants, evaluation of patient's response to treatment, examination of patient, ordering and performing treatments and interventions, ordering and review of laboratory studies, ordering and review of radiographic studies, pulse oximetry, re-evaluation of patient's condition, obtaining history from patient or surrogate and review of old charts   ____________________________________________   INITIAL IMPRESSION / [place] / ED COURSE      Patient presents after being referred to the ED for low sodium on outpatient labs after initially evaluated for 2 or 3 days of worsening generalized weakness.  On arrival he is hypertensive with otherwise stable vital signs on room air.  He is oriented has a nonfocal neuro exam.  I was able to review labs obtained earlier today which showed a sodium of 120.  SPECT this may be the etiology of patient's generalized weakness.  There is no history or exam features to suggest acute trauma or focal deficits to suggest stroke.  Low suspicion for toxic ingestion.  Repeat labs today in emergency room shows sodium of 122 chloride of 87.  This is after patient has had some fluid restriction today has been  waiting  quite some time to be seen.  Anticoagulated on Xarelto however his hemoglobin is stable compared 3 weeks ago and I have a low suspicion for acute acute symptomatic anemia.  UA shows protein and trace leukocyte esterase but no convincing evidence of urinary tract infection.  I suspect patient may have been slightly over diuresed causing hyponatremia again today.  Will defer 3% as it seems pt primarily has constitutional symptoms. I will give some normal sodium chloride and plan to admit to medicine service for further evaluation and management.  ____________________________________________   FINAL CLINICAL IMPRESSION(S) / ED DIAGNOSES  Final diagnoses:  Hyponatremia    Medications  sodium chloride 0.9 % bolus 500 mL (500 mLs Intravenous New Bag/Given 04/21/21 2242)     ED Discharge Orders     None        Note:  This document was prepared using Dragon voice recognition software and may include unintentional dictation errors.    Lucrezia Starch, MD 04/21/21 2253    Lucrezia Starch, MD 04/22/21 [phone]

## 2021-04-21 NOTE — Telephone Encounter (Signed)
CRITICAL VALUE STICKER  CRITICAL VALUE: Sodium 120  RECEIVER (on-site recipient of call):  DATE & TIME NOTIFIED: 8/12 @ 4:37pm  MESSENGER (representative from lab):  MD NOTIFIED: [place]  TIME OF NOTIFICATION: 4:37pm  RESPONSE:

## 2021-04-21 NOTE — Patient Instructions (Signed)
Nice to see you. Organ to get some labs. We are going to clarify your sildenafil prescription. Please do not increase the amount of water you are drinking.

## 2021-04-21 NOTE — Assessment & Plan Note (Signed)
Adequate control.  He will continue torsemide and spironolactone.

## 2021-04-21 NOTE — Assessment & Plan Note (Signed)
Check A1c. 

## 2021-04-21 NOTE — H&P (Signed)
History and Physical    Mckenley Hopper [id number] DOB: 09-25-1940 DOA: 04/21/2021  PCP: [name], MD  Patient coming from: Home  I have personally briefly reviewed patient's old medical records in [place]  Chief Complaint: low sodiun  HPI: Savio Thiesse is a 80 y.o. male with medical history significant for atrial fibrillation on Xarelto, CAD s/p CABG, chronic diastolic heart failure, cirrhosis, hypertension, CKD stage3, HLD who presents with concerns of low sodium.  Patient was recently hospitalized overnight in July for acute on chronic hyponatremia.  He was admitted with a sodium of 120 and was discharged with sodium of 128.  Etiology was thought to be due to his diuretic use so his Lasix was discontinued at discharge.  Nephrology was consulted and advised excessive free water consumption.  He presented to his primary physician for follow-up today and had repeat lab showing sodium level of 120 and was asked to present to the ED.  He reports that for the past 3 days he has been feeling worsening generalized weakness.  Normally ambulates with a cane.  He denies any dizziness or lightheadedness.  Denies any nausea, vomiting or diarrhea.  Has felt constipation last bowel movement yesterday.  Reports drinking a glass of milk with his 3 meals daily.  Also drinks about 6 to 7 glasses of water daily.  States that this has increased from about 5 glasses of water from his admission back in July.  He was otherwise afebrile, mildly hypertensive up to systolic of [id number].  No WBC.  Hemoglobin at baseline of 9.8.  BG of 165.  Review of Systems: Constitutional: No Weight Change, No Fever ENT/Mouth: No sore throat, No Rhinorrhea Eyes: No Eye Pain, No Vision Changes Cardiovascular: No Chest Pain, no SOB,No Edema, Respiratory: No Cough, No Sputum, No Wheezing, no Dyspnea  Gastrointestinal: No Nausea, No Vomiting, No Diarrhea, + Constipation, No Pain Genitourinary: no  Urinary Incontinence Musculoskeletal: No Arthralgias, No Myalgias Skin: No Skin Lesions, No Pruritus, Neuro:+ Weakness, No Numbness Psych: No Anxiety/Panic, No Depression, no decrease appetite Heme/Lymph: No Bruising, No Bleeding  Past Medical History:  Diagnosis Date   (HFpEF) heart failure with preserved ejection fraction (HCC)    CHF (congestive heart failure) (HCC)    Coronary artery disease    Hyperlipidemia    Hypertension    PAH (pulmonary artery hypertension) (HCC)    Permanent atrial fibrillation (HCC)    Pleural effusion     Past Surgical History:  Procedure Laterality Date   CARDIAC CATHETERIZATION     CHOLECYSTECTOMY     COLONOSCOPY WITH PROPOFOL N/A 08/23/2020   Procedure: COLONOSCOPY WITH PROPOFOL;  Surgeon: [name], MD;  Location: [facility] ENDOSCOPY;  Service: Gastroenterology;  Laterality: N/A;   CORONARY ANGIOPLASTY  06/2004   s/p stent placement @ [facility]   CORONARY ARTERY BYPASS GRAFT  04-24-2004   CABG x 3 [facility]   ESOPHAGOGASTRODUODENOSCOPY (EGD) WITH PROPOFOL N/A 08/23/2020   Procedure: ESOPHAGOGASTRODUODENOSCOPY (EGD) WITH PROPOFOL;  Surgeon: [name], MD;  Location: [facility];  Service: Gastroenterology;  Laterality: N/A;  Needs to be last case     reports that he has never smoked. He has never used smokeless tobacco. He reports that he does not drink alcohol and does not use drugs. Social History  Allergies  Allergen Reactions   Sulfa Antibiotics Rash    Rash/hives    Other Rash    Family History  Problem Relation Age of Onset   Heart disease Father  Heart disease Paternal Uncle    Heart attack Brother      Prior to Admission medications   Medication Sig Start Date End Date Taking? Authorizing Provider  atorvastatin (LIPITOR) 80 MG tablet TAKE 1 TABLET BY MOUTH EVERY DAY 03/23/21  Yes Gollan, Kathlene November, MD  ferrous sulfate 325 (65 FE) MG tablet TAKE 1 TABLET BY MOUTH EVERY DAY 12/13/20  Yes [name], MD   pantoprazole (PROTONIX) 40 MG tablet Take 1 tablet (40 mg total) by mouth daily. 07/08/20  Yes Loel Dubonnet, NP  spironolactone (ALDACTONE) 25 MG tablet Take 1 tablet (25 mg total) by mouth daily. 07/04/20  Yes [name], MD  torsemide (DEMADEX) 20 MG tablet Take 1 tablet (20 mg total) by mouth daily. 07/25/20  Yes Walker, Martie Lee, NP  XARELTO 20 MG TABS tablet TAKE 1 TABLET BY MOUTH DAILY WITH SUPPER. 04/13/21  Yes Gollan, Kathlene November, MD  sildenafil (REVATIO) 20 MG tablet Take 1 tablet (20 mg total) by mouth 3 (three) times daily. Patient not taking: No sig reported 12/06/20   [name], MD    Physical Exam: Vitals:   04/21/21 1748 04/21/21 1749 04/21/21 2038 04/21/21 2215  BP: (!) 175/66  (!) 188/71 (!) 157/72  Pulse: 67  62 63  Resp: '16  16 14  '$ Temp: 98.7 F (37.1 C)     TempSrc: Oral     SpO2: 94%  100% 99%  Weight:  73 kg    Height:  '5\' 11"'$  (1.803 m)      Constitutional: NAD, calm, comfortable, elderly gentleman sitting upright in bed watching TV Vitals:   04/21/21 1748 04/21/21 1749 04/21/21 2038 04/21/21 2215  BP: (!) 175/66  (!) 188/71 (!) 157/72  Pulse: 67  62 63  Resp: '16  16 14  '$ Temp: 98.7 F (37.1 C)     TempSrc: Oral     SpO2: 94%  100% 99%  Weight:  73 kg    Height:  '5\' 11"'$  (1.803 m)     Eyes: PERRL, lids and conjunctivae normal ENMT: Mucous membranes are moist.  Neck: normal, supple Respiratory: clear to auscultation bilaterally, no wheezing, no crackles. Normal respiratory effort. No accessory muscle use.  Cardiovascular: Regular rate and rhythm, no murmurs / rubs / gallops. No extremity edema.   Abdomen: no tenderness, no abdominal distention, no masses palpated. Bowel sounds positive.  Musculoskeletal: no clubbing / cyanosis. No joint deformity upper and lower extremities. Good ROM, no contractures. Normal muscle tone.  Skin: no rashes, lesions, ulcers. No induration Neurologic: CN 2-12 grossly intact. Sensation intact,  Strength 5/5  in all 4.  Psychiatric: Normal judgment and insight. Alert and oriented x 3. Normal mood.    Labs on Admission: I have personally reviewed following labs and imaging studies  CBC: Recent Labs  Lab 04/21/21 1801  WBC 6.5  HGB 9.8*  HCT 27.5*  MCV 97.9  PLT [id number]   Basic Metabolic Panel: Recent Labs  Lab 04/21/21 1333 04/21/21 1801  NA 120* 122*  K 4.3 4.4  CL 85* 87*  CO2 28 26  GLUCOSE 104* 165*  BUN 34* 35*  CREATININE 1.09 1.08  CALCIUM 9.4 9.3  MG  --  2.0   GFR: Estimated Creatinine Clearance: 59.1 mL/min (by C-G formula based on SCr of 1.08 mg/dL). Liver Function Tests: Recent Labs  Lab 04/21/21 1333  AST 23  ALT 15  ALKPHOS 101  BILITOT 1.4*  PROT 8.1  ALBUMIN 3.9  No results for input(s): LIPASE, AMYLASE in the last 168 hours. No results for input(s): AMMONIA in the last 168 hours. Coagulation Profile: No results for input(s): INR, PROTIME in the last 168 hours. Cardiac Enzymes: No results for input(s): CKTOTAL, CKMB, CKMBINDEX, TROPONINI in the last 168 hours. BNP (last 3 results) No results for input(s): PROBNP in the last 8760 hours. HbA1C: Recent Labs    04/21/21 1333  HGBA1C 6.0   CBG: No results for input(s): GLUCAP in the last 168 hours. Lipid Profile: Recent Labs    04/21/21 1333  CHOL 113  HDL 58.70  LDLCALC 45  TRIG 46.0  CHOLHDL 2   Thyroid Function Tests: No results for input(s): TSH, T4TOTAL, FREET4, T3FREE, THYROIDAB in the last 72 hours. Anemia Panel: No results for input(s): VITAMINB12, FOLATE, FERRITIN, TIBC, IRON, RETICCTPCT in the last 72 hours. Urine analysis:    Component Value Date/Time   COLORURINE STRAW (A) 04/21/2021 1801   APPEARANCEUR CLEAR (A) 04/21/2021 1801   APPEARANCEUR Hazy 06/06/2013 0947   LABSPEC 1.006 04/21/2021 1801   LABSPEC 1.021 06/06/2013 0947   PHURINE 7.0 04/21/2021 1801   GLUCOSEU NEGATIVE 04/21/2021 1801   GLUCOSEU Negative 06/06/2013 0947   HGBUR SMALL (A) 04/21/2021 1801    BILIRUBINUR NEGATIVE 04/21/2021 1801   BILIRUBINUR Negative 06/06/2013 0947   KETONESUR NEGATIVE 04/21/2021 1801   PROTEINUR 30 (A) 04/21/2021 1801   NITRITE NEGATIVE 04/21/2021 1801   LEUKOCYTESUR TRACE (A) 04/21/2021 1801   LEUKOCYTESUR Negative 06/06/2013 0947    Radiological Exams on Admission: No results found.    Assessment/Plan  Acute on chronic hyponatremia -Patient previously admitted in July with Na of 120 and d/c Na of 128. Lasix discontinued as diuretic use and cirrhosis thought to be the cause. Urine studies at that time was negative for SIADH.  Nephrology also consulted at that time avoid excessive free water consumption. -Obtain urine studies -Hold diuretics at this time  -received 500cc bolus with improvement to 123. Continue gentle IV 50cc/hr fluid for 10 hours -check BMP q4hr -restrict excess free water consumption   Chronic diastolic heart failure -appears euvolemic on exam   Hypertension -holding diuretics   CKD stage III - stable  Chronic atrial fibrillation - Continue home Xarelto  CAD - Continue home statin  DVT prophylaxis:.Xarelto  code Status: Full Family Communication: Plan discussed with patient at bedside  disposition Plan: Home with at least 2 midnight stays  Consults called:  Admission status: inpatient  Level of care: Stepdown  Status is: Inpatient  Remains inpatient appropriate because:Persistent severe electrolyte disturbances  Dispo: The patient is from: Home              Anticipated d/c is to: Home              Patient currently is not medically stable to d/c.   Difficult to place patient No         Orene Desanctis DO [facility]   If 7PM-7AM, please contact night-coverage [email or web address]   04/21/2021, 11:30 PM

## 2021-04-21 NOTE — Telephone Encounter (Signed)
Can you please call the patient and let him know his sodium is dangerously low again at 120? He needs to go back to the hospital for evaluation and management of this. If it drops lower he runs the risk of having a seizure. Thanks.

## 2021-04-21 NOTE — ED Notes (Signed)
ED Provider at bedside. 

## 2021-04-21 NOTE — Assessment & Plan Note (Signed)
The patient had acute on chronic worsening of his hyponatremia.  This improved prior to discharge.  He is no longer takes Lasix.  He does drink 6 to 7 glasses of water daily.  Discussed rechecking his sodium level and if needed we can have him reduce his free water intake.

## 2021-04-21 NOTE — Assessment & Plan Note (Signed)
Check lipid panel.  Continue atorvastatin 80 mg once daily.

## 2021-04-22 ENCOUNTER — Encounter: Payer: Self-pay | Admitting: Internal Medicine

## 2021-04-22 DIAGNOSIS — D638 Anemia in other chronic diseases classified elsewhere: Secondary | ICD-10-CM | POA: Diagnosis not present

## 2021-04-22 DIAGNOSIS — Z8249 Family history of ischemic heart disease and other diseases of the circulatory system: Secondary | ICD-10-CM | POA: Diagnosis not present

## 2021-04-22 DIAGNOSIS — Z951 Presence of aortocoronary bypass graft: Secondary | ICD-10-CM | POA: Diagnosis not present

## 2021-04-22 DIAGNOSIS — K7460 Unspecified cirrhosis of liver: Secondary | ICD-10-CM | POA: Diagnosis not present

## 2021-04-22 DIAGNOSIS — I2510 Atherosclerotic heart disease of native coronary artery without angina pectoris: Secondary | ICD-10-CM | POA: Diagnosis present

## 2021-04-22 DIAGNOSIS — E785 Hyperlipidemia, unspecified: Secondary | ICD-10-CM | POA: Diagnosis not present

## 2021-04-22 DIAGNOSIS — R531 Weakness: Secondary | ICD-10-CM | POA: Diagnosis not present

## 2021-04-22 DIAGNOSIS — Z7901 Long term (current) use of anticoagulants: Secondary | ICD-10-CM | POA: Diagnosis not present

## 2021-04-22 DIAGNOSIS — E871 Hypo-osmolality and hyponatremia: Secondary | ICD-10-CM | POA: Diagnosis not present

## 2021-04-22 DIAGNOSIS — Z20822 Contact with and (suspected) exposure to covid-19: Secondary | ICD-10-CM | POA: Diagnosis not present

## 2021-04-22 DIAGNOSIS — I5032 Chronic diastolic (congestive) heart failure: Secondary | ICD-10-CM | POA: Diagnosis not present

## 2021-04-22 DIAGNOSIS — D649 Anemia, unspecified: Secondary | ICD-10-CM | POA: Diagnosis present

## 2021-04-22 DIAGNOSIS — I4811 Longstanding persistent atrial fibrillation: Secondary | ICD-10-CM | POA: Diagnosis not present

## 2021-04-22 DIAGNOSIS — Z882 Allergy status to sulfonamides status: Secondary | ICD-10-CM | POA: Diagnosis not present

## 2021-04-22 DIAGNOSIS — I4821 Permanent atrial fibrillation: Secondary | ICD-10-CM | POA: Diagnosis not present

## 2021-04-22 DIAGNOSIS — K5900 Constipation, unspecified: Secondary | ICD-10-CM | POA: Diagnosis present

## 2021-04-22 DIAGNOSIS — I110 Hypertensive heart disease with heart failure: Secondary | ICD-10-CM | POA: Diagnosis not present

## 2021-04-22 DIAGNOSIS — I2721 Secondary pulmonary arterial hypertension: Secondary | ICD-10-CM | POA: Diagnosis not present

## 2021-04-22 DIAGNOSIS — Z79899 Other long term (current) drug therapy: Secondary | ICD-10-CM | POA: Diagnosis not present

## 2021-04-22 LAB — BASIC METABOLIC PANEL
Anion gap: 8 (ref 5–15)
Anion gap: 9 (ref 5–15)
Anion gap: 7 (ref 5–15)
BUN: 31 mg/dL — ABNORMAL HIGH (ref 8–23)
BUN: 26 mg/dL — ABNORMAL HIGH (ref 8–23)
BUN: 24 mg/dL — ABNORMAL HIGH (ref 8–23)
CO2: 24 mmol/L (ref 22–32)
CO2: 25 mmol/L (ref 22–32)
CO2: 28 mmol/L (ref 22–32)
Calcium: 8.7 mg/dL — ABNORMAL LOW (ref 8.9–10.3)
Calcium: 9.1 mg/dL (ref 8.9–10.3)
Calcium: 9.3 mg/dL (ref 8.9–10.3)
Chloride: 91 mmol/L — ABNORMAL LOW (ref 98–111)
Chloride: 93 mmol/L — ABNORMAL LOW (ref 98–111)
Chloride: 95 mmol/L — ABNORMAL LOW (ref 98–111)
Creatinine, Ser: 0.96 mg/dL (ref 0.61–1.24)
Creatinine, Ser: 0.96 mg/dL (ref 0.61–1.24)
Creatinine, Ser: 1.03 mg/dL (ref 0.61–1.24)
GFR, Estimated: >60 mL/min (ref >60)
GFR, Estimated: >60 mL/min (ref >60)
GFR, Estimated: >60 mL/min (ref >60)
Glucose, Bld: 123 mg/dL — ABNORMAL HIGH (ref 70–99)
Glucose, Bld: 111 mg/dL — ABNORMAL HIGH (ref 70–99)
Glucose, Bld: 110 mg/dL — ABNORMAL HIGH (ref 70–99)
Potassium: 4.1 mmol/L (ref 3.5–5.1)
Potassium: 4 mmol/L (ref 3.5–5.1)
Potassium: 4.2 mmol/L (ref 3.5–5.1)
Sodium: 123 mmol/L — ABNORMAL LOW (ref 135–145)
Sodium: 127 mmol/L — ABNORMAL LOW (ref 135–145)
Sodium: 130 mmol/L — ABNORMAL LOW (ref 135–145)

## 2021-04-22 LAB — IRON AND TIBC
Iron: 57 ug/dL (ref 45–182)
Saturation Ratios: 17 % — ABNORMAL LOW (ref 17.9–39.5)
TIBC: 330 ug/dL (ref 250–450)
UIBC: 273 ug/dL

## 2021-04-22 LAB — OSMOLALITY, URINE: Osmolality, Ur: 273 mOsm/kg — ABNORMAL LOW (ref 300–900)

## 2021-04-22 LAB — SODIUM, URINE, RANDOM: Sodium, Ur: 40 mmol/L

## 2021-04-22 MED ORDER — PANTOPRAZOLE SODIUM 40 MG PO TBEC: 40.0000 mg | DELAYED_RELEASE_TABLET | Freq: Two times a day (BID) | ORAL | 0 refills | Status: DC

## 2021-04-22 MED ORDER — FERROUS SULFATE 325 (65 FE) MG PO TABS
325.0000 mg | ORAL_TABLET | Freq: Every day | ORAL | Status: DC
  Administered 2021-04-22 – 2021-04-23 (×2): 325 mg via ORAL
  Filled 2021-04-22 (×2): qty 1

## 2021-04-22 MED ORDER — SODIUM CHLORIDE 0.9 % IV SOLN: INTRAVENOUS | Status: DC

## 2021-04-22 MED ORDER — PANTOPRAZOLE SODIUM 40 MG PO TBEC
40.0000 mg | DELAYED_RELEASE_TABLET | Freq: Every day | ORAL | Status: DC
  Administered 2021-04-22 – 2021-04-23 (×2): 40 mg via ORAL
  Filled 2021-04-22 (×2): qty 1

## 2021-04-22 MED ORDER — ATORVASTATIN CALCIUM 20 MG PO TABS
80.0000 mg | ORAL_TABLET | Freq: Every day | ORAL | Status: DC
  Administered 2021-04-22 – 2021-04-23 (×2): 80 mg via ORAL
  Filled 2021-04-22 (×2): qty 4

## 2021-04-22 MED ORDER — RIVAROXABAN 20 MG PO TABS
20.0000 mg | ORAL_TABLET | Freq: Every day | ORAL | Status: DC
  Administered 2021-04-22: 20 mg via ORAL
  Filled 2021-04-22 (×2): qty 1

## 2021-04-22 NOTE — ED Notes (Signed)
Report to [place], [place]

## 2021-04-22 NOTE — ED Notes (Signed)
Pt resting comfortably at this time. Breakfast tray discarded. Pt requested OJ to take medications.

## 2021-04-22 NOTE — ED Notes (Signed)
Pt resting comfortably at this time NAD noted at this time. VSS. Call bell in reach. NS @ 50 ml/hr.

## 2021-04-22 NOTE — Plan of Care (Signed)
Reviewed with pt

## 2021-04-22 NOTE — Progress Notes (Signed)
PROGRESS NOTE    Jordan Perkins  [id number] DOB: 10/07/1940 DOA: 04/21/2021 PCP: [name], MD   Follow-up on hyponatremia Brief Narrative:   Jordan Perkins is a 80 y.o. male with medical history significant for atrial fibrillation on Xarelto, CAD s/p CABG, chronic diastolic heart failure, cirrhosis, hypertension, CKD stage3, HLD who presents with concerns of low sodium.  Assessment & Plan:   Principal Problem:   Hyponatremia Active Problems:   CAD (coronary artery disease)   Atrial fibrillation (HCC)   Chronic diastolic CHF (congestive heart failure) ([place])  #1.  Acute on chronic hyponatremia. Appears to be secondary to dehydration.  Patient was receiving normal saline, sodium level increased to 130. IV fluids will be discontinued, patient will be followed for another day.  Recheck BMP tomorrow.  Most likely he will be given sodium tablets if sodium still out by tomorrow.  2.  Chronic atrial fibrillation. Chronic diastolic congestive heart failure Essential hypertension  Coronary artery disease. Condition all stable.  Heart rate under control.  On Xarelto.  #3.  Anemia. Check iron B12 level.  Patient does not have chronic kidney disease stage III  DVT prophylaxis: Eliquis Code Status: full Family Communication:  Disposition Plan:    Status is: Inpatient  Remains inpatient appropriate because:Inpatient level of care appropriate due to severity of illness  Dispo: The patient is from: Home              Anticipated d/c is to: Home              Patient currently is not medically stable to d/c.   Difficult to place patient No        I/O last 3 completed shifts: In: -  Out: 1800 [Urine:1800] No intake/output data recorded.     Consultants:  None  Procedures: None  Antimicrobials: None   Subjective: Patient doing better today, still has some weakness, denies any short of breath or cough No chest pain or palpitation. No fever  chills pain No dysuria or hematuria.  Objective: Vitals:   04/22/21 0610 04/22/21 0723 04/22/21 1000 04/22/21 1200  BP: (!) 138/54 (!) 150/62 (!) 148/62 (!) 156/58  Pulse: 65 (!) 51 (!) 56 60  Resp: '16 12 14 17  '$ Temp:      TempSrc:      SpO2: 99% 100% 100% 100%  Weight:      Height:        Intake/Output Summary (Last 24 hours) at 04/22/2021 1504 Last data filed at 04/22/2021 0611 Gross per 24 hour  Intake --  Output 1800 ml  Net -1800 ml   Filed Weights   04/21/21 1749  Weight: 73 kg    Examination:  General exam: Appears calm and comfortable  Respiratory system: Clear to auscultation. Respiratory effort normal. Cardiovascular system: S1 & S2 heard, RRR. No JVD, murmurs, rubs, gallops or clicks. No pedal edema. Gastrointestinal system: Abdomen is nondistended, soft and nontender. No organomegaly or masses felt. Normal bowel sounds heard. Central nervous system: Alert and oriented. No focal neurological deficits. Extremities: Symmetric 5 x 5 power. Skin: No rashes, lesions or ulcers Psychiatry: Judgement and insight appear normal. Mood & affect appropriate.     Data Reviewed: I have personally reviewed following labs and imaging studies  CBC: Recent Labs  Lab 04/21/21 1801  WBC 6.5  HGB 9.8*  HCT 27.5*  MCV 97.9  PLT Q000111Q   Basic Metabolic Panel: Recent Labs  Lab 04/21/21 1333 04/21/21 1801 04/22/21 0044  04/22/21 0612 04/22/21 0905  NA 120* 122* 123* 127* 130*  K 4.3 4.4 4.1 4.0 4.2  CL 85* 87* 91* 93* 95*  CO2 '28 26 24 25 28  '$ GLUCOSE 104* 165* 123* 111* 110*  BUN 34* 35* 31* 26* 24*  CREATININE 1.09 1.08 0.96 0.96 1.03  CALCIUM 9.4 9.3 8.7* 9.1 9.3  MG  --  2.0  --   --   --    GFR: Estimated Creatinine Clearance: 62 mL/min (by C-G formula based on SCr of 1.03 mg/dL). Liver Function Tests: Recent Labs  Lab 04/21/21 1333  AST 23  ALT 15  ALKPHOS 101  BILITOT 1.4*  PROT 8.1  ALBUMIN 3.9   No results for input(s): LIPASE, AMYLASE in the  last 168 hours. No results for input(s): AMMONIA in the last 168 hours. Coagulation Profile: No results for input(s): INR, PROTIME in the last 168 hours. Cardiac Enzymes: No results for input(s): CKTOTAL, CKMB, CKMBINDEX, TROPONINI in the last 168 hours. BNP (last 3 results) No results for input(s): PROBNP in the last 8760 hours. HbA1C: Recent Labs    04/21/21 1333  HGBA1C 6.0   CBG: No results for input(s): GLUCAP in the last 168 hours. Lipid Profile: Recent Labs    04/21/21 1333  CHOL 113  HDL 58.70  LDLCALC 45  TRIG 46.0  CHOLHDL 2   Thyroid Function Tests: No results for input(s): TSH, T4TOTAL, FREET4, T3FREE, THYROIDAB in the last 72 hours. Anemia Panel: No results for input(s): VITAMINB12, FOLATE, FERRITIN, TIBC, IRON, RETICCTPCT in the last 72 hours. Sepsis Labs: No results for input(s): PROCALCITON, LATICACIDVEN in the last 168 hours.  Recent Results (from the past 240 hour(s))  Resp Panel by RT-PCR (Flu A&B, Covid) Nasopharyngeal Swab     Status: None   Collection Time: 04/21/21 10:40 PM   Specimen: Nasopharyngeal Swab; Nasopharyngeal(NP) swabs in vial transport medium  Result Value Ref Range Status   SARS Coronavirus 2 by RT PCR NEGATIVE NEGATIVE Final    Comment: (NOTE) SARS-CoV-2 target nucleic acids are NOT DETECTED.  The SARS-CoV-2 RNA is generally detectable in upper respiratory specimens during the acute phase of infection. The lowest concentration of SARS-CoV-2 viral copies this assay can detect is 138 copies/mL. A negative result does not preclude SARS-Cov-2 infection and should not be used as the sole basis for treatment or other patient management decisions. A negative result may occur with  improper specimen collection/handling, submission of specimen other than nasopharyngeal swab, presence of viral mutation(s) within the areas targeted by this assay, and inadequate number of viral copies(<138 copies/mL). A negative result must be combined  with clinical observations, patient history, and epidemiological information. The expected result is Negative.  Fact Sheet for Patients:  EntrepreneurPulse.com.au  Fact Sheet for Healthcare Providers:  IncredibleEmployment.be  This test is no t yet approved or cleared by the [place] FDA and  has been authorized for detection and/or diagnosis of SARS-CoV-2 by FDA under an Emergency Use Authorization (EUA). This EUA will remain  in effect (meaning this test can be used) for the duration of the COVID-19 declaration under Section 564(b)(1) of the Act, 21 U.S.C.section 360bbb-3(b)(1), unless the authorization is terminated  or revoked sooner.       Influenza A by PCR NEGATIVE NEGATIVE Final   Influenza B by PCR NEGATIVE NEGATIVE Final    Comment: (NOTE) The Xpert Xpress SARS-CoV-2/FLU/RSV plus assay is intended as an aid in the diagnosis of influenza from Nasopharyngeal swab specimens and should not  be used as a sole basis for treatment. Nasal washings and aspirates are unacceptable for Xpert Xpress SARS-CoV-2/FLU/RSV testing.  Fact Sheet for Patients: EntrepreneurPulse.com.au  Fact Sheet for Healthcare Providers: IncredibleEmployment.be  This test is not yet approved or cleared by the [place] FDA and has been authorized for detection and/or diagnosis of SARS-CoV-2 by FDA under an Emergency Use Authorization (EUA). This EUA will remain in effect (meaning this test can be used) for the duration of the COVID-19 declaration under Section 564(b)(1) of the Act, 21 U.S.C. section 360bbb-3(b)(1), unless the authorization is terminated or revoked.  Performed at [facility], [address], [place], [place] [address]          Radiology Studies: No results found.      Scheduled Meds:  atorvastatin  80 mg Oral Daily   ferrous sulfate  325 mg Oral Daily   pantoprazole  40 mg Oral  Daily   rivaroxaban  20 mg Oral Q supper   Continuous Infusions:   LOS: 1 day    Time spent: 23 minutes    Sharen Hones, MD [facility]   To contact the attending provider between 7A-7P or the covering provider during after hours 7P-7A, please log into the web site [email or web address] and access using universal [place] password for that web site. If you do not have the password, please call the hospital operator.  04/22/2021, 3:04 PM

## 2021-04-22 NOTE — ED Notes (Signed)
Per pt and niece request, Niece [name]) updated on pts IP room and pt status

## 2021-04-23 LAB — BASIC METABOLIC PANEL
Anion gap: 6 (ref 5–15)
BUN: 23 mg/dL (ref 8–23)
CO2: 26 mmol/L (ref 22–32)
Calcium: 9.2 mg/dL (ref 8.9–10.3)
Chloride: 99 mmol/L (ref 98–111)
Creatinine, Ser: 0.98 mg/dL (ref 0.61–1.24)
GFR, Estimated: >60 mL/min (ref >60)
Glucose, Bld: 118 mg/dL — ABNORMAL HIGH (ref 70–99)
Potassium: 4.2 mmol/L (ref 3.5–5.1)
Sodium: 131 mmol/L — ABNORMAL LOW (ref 135–145)

## 2021-04-23 LAB — CBC WITH DIFFERENTIAL/PLATELET
Abs Immature Granulocytes: 0.03 10*3/uL (ref 0.00–0.07)
Basophils Absolute: 0.1 10*3/uL (ref 0.0–0.1)
Basophils Relative: 1 %
Eosinophils Absolute: 0.2 10*3/uL (ref 0.0–0.5)
Eosinophils Relative: 4 %
HCT: 27.7 % — ABNORMAL LOW (ref 39.0–52.0)
Hemoglobin: 9.9 g/dL — ABNORMAL LOW (ref 13.0–17.0)
Immature Granulocytes: 1 %
Lymphocytes Relative: 15 %
Lymphs Abs: 1 10*3/uL (ref 0.7–4.0)
MCH: 35.4 pg — ABNORMAL HIGH (ref 26.0–34.0)
MCHC: 35.7 g/dL (ref 30.0–36.0)
MCV: 98.9 fL (ref 80.0–100.0)
Monocytes Absolute: 0.7 10*3/uL (ref 0.1–1.0)
Monocytes Relative: 10 %
Neutro Abs: 4.7 10*3/uL (ref 1.7–7.7)
Neutrophils Relative %: 69 %
Platelets: 171 10*3/uL (ref 150–400)
RBC: 2.8 MIL/uL — ABNORMAL LOW (ref 4.22–5.81)
RDW: 14.6 % (ref 11.5–15.5)
WBC: 6.6 10*3/uL (ref 4.0–10.5)
nRBC: 0 % (ref 0.0–0.2)

## 2021-04-23 LAB — MAGNESIUM: Magnesium: 1.7 mg/dL (ref 1.7–2.4)

## 2021-04-23 LAB — VITAMIN B12: Vitamin B-12: 633 pg/mL (ref 180–914)

## 2021-04-23 MED ORDER — SODIUM CHLORIDE 1 G PO TABS: 1.0000 g | ORAL_TABLET | Freq: Two times a day (BID) | ORAL | 0 refills | Status: DC

## 2021-04-23 MED ORDER — SODIUM CHLORIDE 1 G PO TABS
1.0000 g | ORAL_TABLET | Freq: Two times a day (BID) | ORAL | Status: DC
  Administered 2021-04-23: 1 g via ORAL
  Filled 2021-04-23 (×2): qty 1

## 2021-04-23 MED ORDER — RIVAROXABAN 20 MG PO TABS: 20.0000 mg | ORAL_TABLET | Freq: Every day | ORAL | 0 refills | Status: DC

## 2021-04-23 NOTE — Progress Notes (Signed)
Patient provided with discharge education and materials verbalized understanding. IV removed, no issues noted. Currently waiting for ride to arrive.

## 2021-04-23 NOTE — Plan of Care (Signed)
  Problem: Clinical Measurements: Goal: Ability to maintain clinical measurements within normal limits will improve Outcome: Progressing Goal: Will remain free from infection Outcome: Progressing Goal: Diagnostic test results will improve Outcome: Progressing Goal: Respiratory complications will improve Outcome: Progressing Goal: Cardiovascular complication will be avoided Outcome: Progressing   Problem: Activity: Goal: Risk for activity intolerance will decrease Outcome: Progressing   Pt is involved in and agrees with the plan of care. V/S stable.

## 2021-04-23 NOTE — Discharge Summary (Signed)
Physician Discharge Summary  Patient ID: Jordan Perkins MRN: [id number] DOB/AGE: 15-Sep-1940 80 y.o.  Admit date: 04/21/2021 Discharge date: 04/23/2021  Admission Diagnoses:  Discharge Diagnoses:  Principal Problem:   Hyponatremia Active Problems:   CAD (coronary artery disease)   Atrial fibrillation (HCC)   Chronic diastolic CHF (congestive heart failure) (HCC)   Symptomatic anemia   Discharged Condition: good  Hospital Course:  Jordan Perkins is a 80 y.o. male with medical history significant for atrial fibrillation on Xarelto, CAD s/p CABG, chronic diastolic heart failure, cirrhosis, hypertension, CKD stage3, HLD who presents with concerns of low sodium.  #1.  Acute on chronic hyponatremia. Appears to be secondary to dehydration.  Patient was receiving normal saline, sodium level increased to 130.  Normal saline was discontinued yesterday, sodium today is 131. Patient currently has no symptoms, he is medically stable to be discharged.  We will give salt tablets 1 g twice a day.  I will continue his diuretics with torsemide, discontinue Aldactone.  He need to check another BMP at next visit with PCP.  2.  Chronic atrial fibrillation. Chronic diastolic congestive heart failure Essential hypertension  Coronary artery disease. Condition all stable.  Heart rate under control.  On Xarelto.   #3.  Anemia of chronic disease. No significant iron deficiency, B12 level still pending after 24 hours.  Please check on results when available   Patient does not have chronic kidney disease stage III. Ruled out.   Consults: None  Significant Diagnostic Studies:  Treatments: IVF  Discharge Exam: Blood pressure (!) 129/52, pulse (!) 58, temperature 97.8 F (36.6 C), temperature source Oral, resp. rate 14, height '5\' 11"'$  (1.803 m), weight 73 kg, SpO2 99 %. General appearance: alert and cooperative Resp: clear to auscultation bilaterally Cardio: regular rate and rhythm, S1, S2  normal, no murmur, click, rub or gallop GI: soft, non-tender; bowel sounds normal; no masses,  no organomegaly Extremities: extremities normal, atraumatic, no cyanosis or edema  Disposition: Discharge disposition: 01-Home or Self Care       Discharge Instructions     Diet - low sodium heart healthy   Complete by: As directed    Diet - low sodium heart healthy   Complete by: As directed    Increase activity slowly   Complete by: As directed    Increase activity slowly   Complete by: As directed       Allergies as of 04/23/2021       Reactions   Sulfa Antibiotics Rash   Rash/hives   Other Rash        Medication List     STOP taking these medications    sildenafil 20 MG tablet Commonly known as: REVATIO       TAKE these medications    atorvastatin 80 MG tablet Commonly known as: LIPITOR TAKE 1 TABLET BY MOUTH EVERY DAY   ferrous sulfate 325 (65 FE) MG tablet TAKE 1 TABLET BY MOUTH EVERY DAY   pantoprazole 40 MG tablet Commonly known as: PROTONIX Take 1 tablet (40 mg total) by mouth 2 (two) times daily. What changed: when to take this   rivaroxaban 20 MG Tabs tablet Commonly known as: Xarelto Take 1 tablet (20 mg total) by mouth daily with supper. What changed: how much to take   sodium chloride 1 g tablet Take 1 tablet (1 g total) by mouth 2 (two) times daily with a meal.   spironolactone 25 MG tablet Commonly known as: ALDACTONE Take 1 tablet (  25 mg total) by mouth daily.   torsemide 20 MG tablet Commonly known as: DEMADEX Take 1 tablet (20 mg total) by mouth daily.        Follow-up Information     [name], MD Follow up in 1 week(s).   Specialty: Family Medicine Contact information: [address] [place] [address] [phone]         [name], MD .   Specialty: Cardiology Contact information: [place] [address] [phone]                  Signed: Sharen Hones 04/23/2021, 9:44 AM

## 2021-04-23 NOTE — Progress Notes (Signed)
Patient discharged from unit with all belongings. No issues or concerns noted at this time.

## 2021-04-24 DIAGNOSIS — I272 Pulmonary hypertension, unspecified: Secondary | ICD-10-CM | POA: Diagnosis not present

## 2021-04-24 DIAGNOSIS — R531 Weakness: Secondary | ICD-10-CM | POA: Diagnosis not present

## 2021-04-24 DIAGNOSIS — I700 Atherosclerosis of aorta: Secondary | ICD-10-CM | POA: Diagnosis not present

## 2021-04-24 DIAGNOSIS — R918 Other nonspecific abnormal finding of lung field: Secondary | ICD-10-CM | POA: Diagnosis not present

## 2021-04-24 DIAGNOSIS — I110 Hypertensive heart disease with heart failure: Secondary | ICD-10-CM | POA: Diagnosis not present

## 2021-04-24 DIAGNOSIS — D509 Iron deficiency anemia, unspecified: Secondary | ICD-10-CM | POA: Diagnosis not present

## 2021-04-24 DIAGNOSIS — K219 Gastro-esophageal reflux disease without esophagitis: Secondary | ICD-10-CM | POA: Diagnosis not present

## 2021-04-24 DIAGNOSIS — I2510 Atherosclerotic heart disease of native coronary artery without angina pectoris: Secondary | ICD-10-CM | POA: Diagnosis not present

## 2021-04-24 DIAGNOSIS — I5030 Unspecified diastolic (congestive) heart failure: Secondary | ICD-10-CM | POA: Diagnosis not present

## 2021-04-24 DIAGNOSIS — E871 Hypo-osmolality and hyponatremia: Secondary | ICD-10-CM | POA: Diagnosis not present

## 2021-04-25 NOTE — Progress Notes (Deleted)
Cardiology Office Note:    Date:  04/26/2021   ID:  Jordan Perkins, DOB Sep 03, 1941, MRN [id number]  PCP:  [name], MD  [facility] Cardiologist:  Ida Rogue, MD  [facility] Electrophysiologist:  None   Referring MD: [name], MD   Chief Complaint: Hospital follow-up  History of Present Illness:    Jordan Perkins is a 80 y.o. male with a hx of CAD s/p CABG in 2005 at [facility] with subsequent stenting, HTN, HLD, permanent Afib on Xarelto, HFpEF, CKD stage 3, pulmonary HTN, b/l carotid artery disease, and anemia who presents for hospital follow-up.   Patient had a stress test 2014 that showed no ischemia. Echo 05/2013 showed normal LVEF.  Carotid US showed 09/2015 50-59% stenosis on the left, <39% on the right. Echo July 2021 showed EF 60-65%, severely elevated right heart pressures.   Hospitalized 03/2020 for anasarca. BB discontinued for bradycardia. Xarelto was held for FOBT and falls, but this was subsequently started again.    Echo at [facility] 03/2020 showed EF 60%, pulmonary HTN.   Heart monitor 05/2020 showed rate controlled afib with avg 5bpm, 2 episodes of VT, fastest run 7 beats.    Last seen 12/06/20 and was doing well. BP a little high. He had previously been referred to heart failure clinic but was unable to drive to [place]. He was started on sildenafil. Repeat echo 02/02/21 showed LVEF 55-60%, G2DD, mildly reduced RV function, Rv moderately enlarged, severely dilated LA, moderately dilated RA, mild MR, PASP 105,mmHg.   Recently admitted twice in the last 2 months for hyponatremia.   Today,     Past Medical History:  Diagnosis Date   (HFpEF) heart failure with preserved ejection fraction (HCC)    CHF (congestive heart failure) (HCC)    Coronary artery disease    Hyperlipidemia    Hypertension    PAH (pulmonary artery hypertension) (HCC)    Permanent atrial fibrillation (HCC)    Pleural effusion     Past Surgical History:   Procedure Laterality Date   CARDIAC CATHETERIZATION     CHOLECYSTECTOMY     COLONOSCOPY WITH PROPOFOL N/A 08/23/2020   Procedure: COLONOSCOPY WITH PROPOFOL;  Surgeon: [name], MD;  Location: [facility] ENDOSCOPY;  Service: Gastroenterology;  Laterality: N/A;   CORONARY ANGIOPLASTY  06/2004   s/p stent placement @ [facility]   CORONARY ARTERY BYPASS GRAFT  04-24-2004   CABG x 3 [facility]   ESOPHAGOGASTRODUODENOSCOPY (EGD) WITH PROPOFOL N/A 08/23/2020   Procedure: ESOPHAGOGASTRODUODENOSCOPY (EGD) WITH PROPOFOL;  Surgeon: [name], MD;  Location: [place];  Service: Gastroenterology;  Laterality: N/A;  Needs to be last case    Current Medications: No outpatient medications have been marked as taking for the 04/26/21 encounter (Appointment) with Kathlen Mody, Daxten Kovalenko H, PA-C.     Allergies:   Sulfa antibiotics and Other   Social History   Socioeconomic History   Marital status: Single    Spouse name: none   Number of children: Not on file   Years of education: high school   Highest education level: Not on file  Occupational History   Occupation: retired  Tobacco Use   Smoking status: Never   Smokeless tobacco: Never  Vaping Use   Vaping Use: Never used  Substance and Sexual Activity   Alcohol use: No   Drug use: No   Sexual activity: Not Currently  Other Topics Concern   Not on file  Social History Narrative   Not on  file   Social Determinants of Health   Financial Resource Strain: Not on file  Food Insecurity: No Food Insecurity   Worried About Charity fundraiser in the Last Year: Never true   Ran Out of Food in the Last Year: Never true  Transportation Needs: Unmet Transportation Needs   Lack of Transportation (Medical): Yes   Lack of Transportation (Non-Medical): No  Physical Activity: Unknown   Days of Exercise per Week: 0 days   Minutes of Exercise per Session: Not on file  Stress: Not on file  Social Connections: Unknown   Frequency of Communication with  Friends and Family: More than three times a week   Frequency of Social Gatherings with Friends and Family: More than three times a week   Attends Religious Services: Patient refused   Marine scientist or Organizations: Patient refused   Attends Archivist Meetings: Patient refused   Marital Status: Never married     Family History: The patient's family history includes Heart attack in his brother; Heart disease in his father and paternal uncle.  ROS:   Please see the history of present illness.     All other systems reviewed and are negative.  EKGs/Labs/Other Studies Reviewed:    The following studies were reviewed today:  Echo 01/2021  1. Left ventricular ejection fraction, by estimation, is 55 to 60%. The  left ventricle has normal function. The left ventricle has no regional  wall motion abnormalities. Left ventricular diastolic parameters are  consistent with Grade II diastolic  dysfunction (pseudonormalization).   2. Right ventricular systolic function is mildly reduced. The right  ventricular size is moderately enlarged.   3. Left atrial size was severely dilated.   4. Right atrial size was moderately dilated.   5. The mitral valve is degenerative. Mild mitral valve regurgitation.   6. The aortic valve is tricuspid. Aortic valve regurgitation is not  visualized. Mild aortic valve sclerosis is present, with no evidence of  aortic valve stenosis.   7. The inferior vena cava is dilated in size with <50% respiratory  variability, suggesting right atrial pressure of 15 mmHg.   Comparison(s): EF 60-65%, a fib, PASP 105 mmHg.   Heart monitor 05/2020 Event Monitor    Atrial Fibrillation occurred continuously (100% burden), ranging from 32-104 bpm (avg of 56 bpm).   2 Ventricular Tachycardia runs occurred, the run with the fastest interval lasting 7 beats with a max rate of 171 bpm (avg 117 bpm);  the run with the fastest interval was also the longest.     Isolated VEs were occasional (2.6%, 28107), VE Couplets were rare (<1.0%, 393), and VE Triplets were rare (<1.0%, 14). Ventricular Bigeminy and Trigeminy were present.   Signed, Esmond Plants, MD, Ph.D [facility]     EKG:  EKG is *** ordered today.  The ekg ordered today demonstrates ***  Recent Labs: 04/30/2020: B Natriuretic Peptide 283.6 04/21/2021: ALT 15 04/23/2021: BUN 23; Creatinine, Ser 0.98; Hemoglobin 9.9; Magnesium 1.7; Platelets 171; Potassium 4.2; Sodium 131  Recent Lipid Panel    Component Value Date/Time   CHOL 113 04/21/2021 1333   CHOL 89 (L) 04/06/2015 1642   CHOL 150 06/06/2013 0947   TRIG 46.0 04/21/2021 1333   TRIG 85 06/06/2013 0947   HDL 58.70 04/21/2021 1333   HDL 40 04/06/2015 1642   HDL 43 06/06/2013 0947   CHOLHDL 2 04/21/2021 1333   VLDL 9.2 04/21/2021 1333   VLDL 17 06/06/2013 0947  LDLCALC 45 04/21/2021 1333   LDLCALC 36 04/06/2015 1642   LDLCALC 90 06/06/2013 0947     Risk Assessment/Calculations:   {Does this patient have ATRIAL FIBRILLATION?:[phone]}   Physical Exam:    VS:  There were no vitals taken for this visit.    Wt Readings from Last 3 Encounters:  04/21/21 160 lb 15 oz (73 kg)  04/21/21 161 lb 9.6 oz (73.3 kg)  04/13/21 164 lb (74.4 kg)     GEN: *** Well nourished, well developed in no acute distress HEENT: Normal NECK: No JVD; No carotid bruits LYMPHATICS: No lymphadenopathy CARDIAC: ***RRR, no murmurs, rubs, gallops RESPIRATORY:  Clear to auscultation without rales, wheezing or rhonchi  ABDOMEN: Soft, non-tender, non-distended MUSCULOSKELETAL:  No edema; No deformity  SKIN: Warm and dry NEUROLOGIC:  Alert and oriented x 3 PSYCHIATRIC:  Normal affect   ASSESSMENT:    1. Permanent atrial fibrillation ([place])   2. Essential hypertension   3. Pulmonary hypertension, unspecified ([place])   4. Chronic diastolic congestive heart failure ([place])   5. PAD (peripheral artery disease) ([place])   6. Coronary artery disease  of native artery of native heart with stable angina pectoris (HCC)    PLAN:    In order of problems listed above:  Permanent Afib Xarelto  CAD s/p CABG  HFpEF  Pulmonary HTN Sildenafil Right heart cath  Bilateral carotid artery disease  HTN  Disposition: Follow up {follow up:15908} with ***   Shared Decision Making/Informed Consent   {Are you ordering a CV Procedure (e.g. stress test, cath, DCCV, TEE, etc)?   Press F2        :[id number]    Signed, Berkleigh Beckles Arlyss Repress  04/26/2021 7:36 AM    [facility]

## 2021-04-26 ENCOUNTER — Telehealth: Payer: Self-pay

## 2021-04-26 ENCOUNTER — Ambulatory Visit: Payer: Medicaid Other | Admitting: Medical

## 2021-04-26 ENCOUNTER — Other Ambulatory Visit: Payer: Self-pay | Admitting: *Deleted

## 2021-04-26 DIAGNOSIS — I700 Atherosclerosis of aorta: Secondary | ICD-10-CM | POA: Diagnosis not present

## 2021-04-26 DIAGNOSIS — I272 Pulmonary hypertension, unspecified: Secondary | ICD-10-CM | POA: Diagnosis not present

## 2021-04-26 DIAGNOSIS — R918 Other nonspecific abnormal finding of lung field: Secondary | ICD-10-CM | POA: Diagnosis not present

## 2021-04-26 DIAGNOSIS — I2510 Atherosclerotic heart disease of native coronary artery without angina pectoris: Secondary | ICD-10-CM | POA: Diagnosis not present

## 2021-04-26 DIAGNOSIS — D509 Iron deficiency anemia, unspecified: Secondary | ICD-10-CM | POA: Diagnosis not present

## 2021-04-26 DIAGNOSIS — I110 Hypertensive heart disease with heart failure: Secondary | ICD-10-CM | POA: Diagnosis not present

## 2021-04-26 DIAGNOSIS — K219 Gastro-esophageal reflux disease without esophagitis: Secondary | ICD-10-CM | POA: Diagnosis not present

## 2021-04-26 DIAGNOSIS — E871 Hypo-osmolality and hyponatremia: Secondary | ICD-10-CM | POA: Diagnosis not present

## 2021-04-26 DIAGNOSIS — R531 Weakness: Secondary | ICD-10-CM | POA: Diagnosis not present

## 2021-04-26 DIAGNOSIS — I5030 Unspecified diastolic (congestive) heart failure: Secondary | ICD-10-CM | POA: Diagnosis not present

## 2021-04-26 NOTE — Telephone Encounter (Signed)
Transition Care Management Unsuccessful Follow-up Telephone Call  Date of discharge and from where:  04/23/21 from [facility]  Attempts:  1st Attempt  Reason for unsuccessful TCM follow-up call:  Unable to reach patient

## 2021-04-26 NOTE — Patient Outreach (Signed)
[facility]) Care Management  04/26/2021  Jordan Perkins [birth year] [id number]   RN Health Coach attempted follow up outreach call to patient.  Patient was unavailable. HIPPA compliance voicemail message left with return callback number.  Plan: RN will call patient again within 30 days.  [place] Care Management [phone]

## 2021-04-27 ENCOUNTER — Encounter: Payer: Self-pay | Admitting: Medical

## 2021-04-27 ENCOUNTER — Telehealth: Payer: Self-pay | Admitting: Family Medicine

## 2021-04-27 ENCOUNTER — Telehealth: Payer: Self-pay

## 2021-04-27 NOTE — Telephone Encounter (Signed)
Transition Care Management Unsuccessful Follow-up Telephone Call  Date of discharge and from where:  04/23/21 from [facility]  Attempts:  2nd Attempt  Reason for unsuccessful TCM follow-up call:  Unable to leave message.

## 2021-04-27 NOTE — Telephone Encounter (Signed)
Can he come in at 4:15 on 04/28/21?

## 2021-04-27 NOTE — Telephone Encounter (Signed)
9 am.  Phone call made to patient to follow up on recent hospitalization.   No scheduled follow up appointment with PCP.  Patient agreeable to have an appointment set up but will need transportation assistance.  Advised that I would reach out to PCP office to have appointment scheduled and will arrange transportation for patient.    40 am.  Phone call made to PCP office.  Message will be sent to PCP nurse and call back is expected to schedule follow up appointment for patient.

## 2021-04-27 NOTE — Telephone Encounter (Signed)
Almyra Free from [facility] is calling to schedule the patient for a hospital follow up by the end of this week.No openings available end of this week,please advise.Almyra Free can be reached at [phone].

## 2021-04-28 NOTE — Telephone Encounter (Signed)
Transition Care Management Unsuccessful Follow-up Telephone Call  Date of discharge and from where:   04/23/21 from [facility]  Attempts:  3rd Attempt  Reason for unsuccessful TCM follow-up call:  Unable to leave message

## 2021-04-28 NOTE — Telephone Encounter (Signed)
04/28/21 1011 am.  Incoming call from Gae Bon advising patient can be seen today at 415 pm.  Advised patient will need transportation.  [place] # provided and Gae Bon will follow up with see if patient can be brought in today.  417 pm.  Incoming call from [place].  She is has been unable to reach anyone at the transportation # that I provided.  I also tried to call but was unsuccessful.   Advised that I would reach out to the office Monday to see how much notice was needed for transportation to be scheduled.  I will follow back up with Gae Bon on Monday once this information is obtained and visit with PCP can be scheduled.

## 2021-04-28 NOTE — Telephone Encounter (Signed)
I called Almyra Free from [place] [facility] and she gave me the number for [facility] for [facility] [employer] at [phone] and I could not reach anyone to set up transportation, I called Almyra Free back and informed her that I could not reach anyone and I can't schedule the patient til we have transportation and she understood and will reach out to me on Monday.  Aoife Bold,cma

## 2021-05-01 DIAGNOSIS — I5030 Unspecified diastolic (congestive) heart failure: Secondary | ICD-10-CM | POA: Diagnosis not present

## 2021-05-01 DIAGNOSIS — I700 Atherosclerosis of aorta: Secondary | ICD-10-CM | POA: Diagnosis not present

## 2021-05-01 DIAGNOSIS — E871 Hypo-osmolality and hyponatremia: Secondary | ICD-10-CM | POA: Diagnosis not present

## 2021-05-01 DIAGNOSIS — R531 Weakness: Secondary | ICD-10-CM | POA: Diagnosis not present

## 2021-05-01 DIAGNOSIS — R918 Other nonspecific abnormal finding of lung field: Secondary | ICD-10-CM | POA: Diagnosis not present

## 2021-05-01 DIAGNOSIS — K219 Gastro-esophageal reflux disease without esophagitis: Secondary | ICD-10-CM | POA: Diagnosis not present

## 2021-05-01 DIAGNOSIS — I110 Hypertensive heart disease with heart failure: Secondary | ICD-10-CM | POA: Diagnosis not present

## 2021-05-01 DIAGNOSIS — I272 Pulmonary hypertension, unspecified: Secondary | ICD-10-CM | POA: Diagnosis not present

## 2021-05-01 DIAGNOSIS — I2510 Atherosclerotic heart disease of native coronary artery without angina pectoris: Secondary | ICD-10-CM | POA: Diagnosis not present

## 2021-05-01 DIAGNOSIS — D509 Iron deficiency anemia, unspecified: Secondary | ICD-10-CM | POA: Diagnosis not present

## 2021-05-02 DIAGNOSIS — K219 Gastro-esophageal reflux disease without esophagitis: Secondary | ICD-10-CM | POA: Diagnosis not present

## 2021-05-02 DIAGNOSIS — R918 Other nonspecific abnormal finding of lung field: Secondary | ICD-10-CM | POA: Diagnosis not present

## 2021-05-02 DIAGNOSIS — I2510 Atherosclerotic heart disease of native coronary artery without angina pectoris: Secondary | ICD-10-CM | POA: Diagnosis not present

## 2021-05-02 DIAGNOSIS — R531 Weakness: Secondary | ICD-10-CM | POA: Diagnosis not present

## 2021-05-02 DIAGNOSIS — E871 Hypo-osmolality and hyponatremia: Secondary | ICD-10-CM | POA: Diagnosis not present

## 2021-05-02 DIAGNOSIS — I700 Atherosclerosis of aorta: Secondary | ICD-10-CM | POA: Diagnosis not present

## 2021-05-02 DIAGNOSIS — D509 Iron deficiency anemia, unspecified: Secondary | ICD-10-CM | POA: Diagnosis not present

## 2021-05-02 DIAGNOSIS — I5030 Unspecified diastolic (congestive) heart failure: Secondary | ICD-10-CM | POA: Diagnosis not present

## 2021-05-02 DIAGNOSIS — I110 Hypertensive heart disease with heart failure: Secondary | ICD-10-CM | POA: Diagnosis not present

## 2021-05-02 DIAGNOSIS — I272 Pulmonary hypertension, unspecified: Secondary | ICD-10-CM | POA: Diagnosis not present

## 2021-05-03 ENCOUNTER — Other Ambulatory Visit: Payer: Self-pay

## 2021-05-03 ENCOUNTER — Telehealth: Payer: Self-pay

## 2021-05-03 DIAGNOSIS — R531 Weakness: Secondary | ICD-10-CM | POA: Diagnosis not present

## 2021-05-03 DIAGNOSIS — D509 Iron deficiency anemia, unspecified: Secondary | ICD-10-CM | POA: Diagnosis not present

## 2021-05-03 DIAGNOSIS — R918 Other nonspecific abnormal finding of lung field: Secondary | ICD-10-CM | POA: Diagnosis not present

## 2021-05-03 DIAGNOSIS — E871 Hypo-osmolality and hyponatremia: Secondary | ICD-10-CM | POA: Diagnosis not present

## 2021-05-03 DIAGNOSIS — I110 Hypertensive heart disease with heart failure: Secondary | ICD-10-CM | POA: Diagnosis not present

## 2021-05-03 DIAGNOSIS — I700 Atherosclerosis of aorta: Secondary | ICD-10-CM | POA: Diagnosis not present

## 2021-05-03 DIAGNOSIS — I5030 Unspecified diastolic (congestive) heart failure: Secondary | ICD-10-CM | POA: Diagnosis not present

## 2021-05-03 DIAGNOSIS — K219 Gastro-esophageal reflux disease without esophagitis: Secondary | ICD-10-CM | POA: Diagnosis not present

## 2021-05-03 DIAGNOSIS — I2510 Atherosclerotic heart disease of native coronary artery without angina pectoris: Secondary | ICD-10-CM | POA: Diagnosis not present

## 2021-05-03 DIAGNOSIS — I272 Pulmonary hypertension, unspecified: Secondary | ICD-10-CM | POA: Diagnosis not present

## 2021-05-03 NOTE — Telephone Encounter (Signed)
542 pm.  Phone call made to patient to advise of his appointment with PCP on August 31 @ 1130 am.  Orion Modest has been contacted to set up transportation.  Patient confirmed appointment and pick up.

## 2021-05-04 NOTE — Telephone Encounter (Signed)
Patient is scheduled to see the provider.  Ramla Hase,cma

## 2021-05-08 DIAGNOSIS — D509 Iron deficiency anemia, unspecified: Secondary | ICD-10-CM | POA: Diagnosis not present

## 2021-05-08 DIAGNOSIS — R918 Other nonspecific abnormal finding of lung field: Secondary | ICD-10-CM | POA: Diagnosis not present

## 2021-05-08 DIAGNOSIS — I5030 Unspecified diastolic (congestive) heart failure: Secondary | ICD-10-CM | POA: Diagnosis not present

## 2021-05-08 DIAGNOSIS — K219 Gastro-esophageal reflux disease without esophagitis: Secondary | ICD-10-CM | POA: Diagnosis not present

## 2021-05-08 DIAGNOSIS — R531 Weakness: Secondary | ICD-10-CM | POA: Diagnosis not present

## 2021-05-08 DIAGNOSIS — I110 Hypertensive heart disease with heart failure: Secondary | ICD-10-CM | POA: Diagnosis not present

## 2021-05-08 DIAGNOSIS — I2510 Atherosclerotic heart disease of native coronary artery without angina pectoris: Secondary | ICD-10-CM | POA: Diagnosis not present

## 2021-05-08 DIAGNOSIS — E871 Hypo-osmolality and hyponatremia: Secondary | ICD-10-CM | POA: Diagnosis not present

## 2021-05-08 DIAGNOSIS — I700 Atherosclerosis of aorta: Secondary | ICD-10-CM | POA: Diagnosis not present

## 2021-05-08 DIAGNOSIS — I272 Pulmonary hypertension, unspecified: Secondary | ICD-10-CM | POA: Diagnosis not present

## 2021-05-09 DIAGNOSIS — I272 Pulmonary hypertension, unspecified: Secondary | ICD-10-CM | POA: Diagnosis not present

## 2021-05-09 DIAGNOSIS — I110 Hypertensive heart disease with heart failure: Secondary | ICD-10-CM | POA: Diagnosis not present

## 2021-05-09 DIAGNOSIS — I700 Atherosclerosis of aorta: Secondary | ICD-10-CM | POA: Diagnosis not present

## 2021-05-09 DIAGNOSIS — E871 Hypo-osmolality and hyponatremia: Secondary | ICD-10-CM | POA: Diagnosis not present

## 2021-05-09 DIAGNOSIS — I2510 Atherosclerotic heart disease of native coronary artery without angina pectoris: Secondary | ICD-10-CM | POA: Diagnosis not present

## 2021-05-09 DIAGNOSIS — K219 Gastro-esophageal reflux disease without esophagitis: Secondary | ICD-10-CM | POA: Diagnosis not present

## 2021-05-09 DIAGNOSIS — I5030 Unspecified diastolic (congestive) heart failure: Secondary | ICD-10-CM | POA: Diagnosis not present

## 2021-05-09 DIAGNOSIS — R918 Other nonspecific abnormal finding of lung field: Secondary | ICD-10-CM | POA: Diagnosis not present

## 2021-05-09 DIAGNOSIS — R531 Weakness: Secondary | ICD-10-CM | POA: Diagnosis not present

## 2021-05-09 DIAGNOSIS — D509 Iron deficiency anemia, unspecified: Secondary | ICD-10-CM | POA: Diagnosis not present

## 2021-05-10 ENCOUNTER — Other Ambulatory Visit: Payer: Self-pay | Admitting: *Deleted

## 2021-05-10 ENCOUNTER — Ambulatory Visit: Payer: Medicaid Other | Admitting: Family Medicine

## 2021-05-10 ENCOUNTER — Other Ambulatory Visit: Payer: Self-pay

## 2021-05-10 ENCOUNTER — Telehealth: Payer: Self-pay | Admitting: Family Medicine

## 2021-05-10 ENCOUNTER — Ambulatory Visit: Payer: Self-pay | Admitting: *Deleted

## 2021-05-10 DIAGNOSIS — I10 Essential (primary) hypertension: Secondary | ICD-10-CM

## 2021-05-10 NOTE — Telephone Encounter (Signed)
Patient called in today to cancel his hospital follow up for today due to transportation issues.Offered to reschedule patient but there are no openings until 10/4,please advise.  Jaquel Glassburn,cma

## 2021-05-10 NOTE — Telephone Encounter (Signed)
Patient called in today to cancel his hospital follow up for today due to transportation issues.Offered to reschedule patient but there are no openings until 10/4,please advise.

## 2021-05-10 NOTE — Patient Outreach (Signed)
[facility]) Care Management  05/10/2021  Jordan Perkins 1940/10/06 [id number]   RN Health Coach Discipline closure. Patient had been admitted to hospital. Patient will be followed by Complex Care Coordinator once discharged.  Plan: RN Health Coach Discipline Closure Discipline Closure letter sent to PCP  [place] Management [phone]

## 2021-05-10 NOTE — Telephone Encounter (Signed)
1 PM on September 6.

## 2021-05-11 ENCOUNTER — Telehealth: Payer: Self-pay

## 2021-05-11 NOTE — Telephone Encounter (Signed)
I called Almyra Free and she set up his transportation through [employer] and I scheduled the patient per dr. Caryl Bis for 1 pm on September 6, I then called the patient and gave him the information and he understood and will be ready.  Meline Russaw,cma

## 2021-05-11 NOTE — Telephone Encounter (Signed)
229 pm.  Return call made to [facility].  Patient missed his visit yesterday as transportation did not pick him up as previously scheduled.  PCP has an opening  September 6 @ 1 pm but will need transportation set up.  Attempted to contact [facility] but unable to reach an attendant. Phone call made to [facility] and transportation is scheduled with pick up time of 12 pm on September 6.  Return call made to Gae Bon with PCP office to advise of above.

## 2021-05-11 NOTE — Chronic Care Management (AMB) (Signed)
  Chronic Care Management   Note  05/11/2021 Name: Jordan Perkins MRN: [id number] DOB: [birth year]  Jordan Perkins is a 80 y.o. year old male who is a primary care patient of Caryl Bis, Angela Adam, MD. I reached out to [name] by phone today in response to a referral sent by Jordan Perkins PCP, [name], MD      Jordan Perkins was given information about Chronic Care Management services today including:  CCM service includes personalized support from designated clinical staff supervised by his physician, including individualized plan of care and coordination with other care providers 24/7 contact phone numbers for assistance for urgent and routine care needs. Service will only be billed when office clinical staff spend 20 minutes or more in a month to coordinate care. Only one practitioner may furnish and bill the service in a calendar month. The patient may stop CCM services at any time (effective at the end of the month) by phone call to the office staff. The patient will be responsible for cost sharing (co-pay) of up to 20% of the service fee (after annual deductible is met).  Patient did not agree to enrollment in care management services and does not wish to consider at this time.  Follow up plan: Patient declines engagement by the care management team. Appropriate care team members and provider have been notified via electronic communication.   Noreene Larsson, [place], [place], [place] [address] Direct Dial: [phone] [email or web address] Website: [email or web address]

## 2021-05-11 NOTE — Telephone Encounter (Signed)
I called Almyra Free and LVM informing her to call back and setup transportation for the patient. Halley Kincer,cma

## 2021-05-15 DIAGNOSIS — I2510 Atherosclerotic heart disease of native coronary artery without angina pectoris: Secondary | ICD-10-CM | POA: Diagnosis not present

## 2021-05-15 DIAGNOSIS — R918 Other nonspecific abnormal finding of lung field: Secondary | ICD-10-CM | POA: Diagnosis not present

## 2021-05-15 DIAGNOSIS — I272 Pulmonary hypertension, unspecified: Secondary | ICD-10-CM | POA: Diagnosis not present

## 2021-05-15 DIAGNOSIS — I110 Hypertensive heart disease with heart failure: Secondary | ICD-10-CM | POA: Diagnosis not present

## 2021-05-15 DIAGNOSIS — R531 Weakness: Secondary | ICD-10-CM | POA: Diagnosis not present

## 2021-05-15 DIAGNOSIS — K219 Gastro-esophageal reflux disease without esophagitis: Secondary | ICD-10-CM | POA: Diagnosis not present

## 2021-05-15 DIAGNOSIS — I700 Atherosclerosis of aorta: Secondary | ICD-10-CM | POA: Diagnosis not present

## 2021-05-15 DIAGNOSIS — E871 Hypo-osmolality and hyponatremia: Secondary | ICD-10-CM | POA: Diagnosis not present

## 2021-05-15 DIAGNOSIS — I5030 Unspecified diastolic (congestive) heart failure: Secondary | ICD-10-CM | POA: Diagnosis not present

## 2021-05-15 DIAGNOSIS — D509 Iron deficiency anemia, unspecified: Secondary | ICD-10-CM | POA: Diagnosis not present

## 2021-05-16 ENCOUNTER — Ambulatory Visit (INDEPENDENT_AMBULATORY_CARE_PROVIDER_SITE_OTHER): Payer: Medicare HMO | Admitting: Family Medicine

## 2021-05-16 ENCOUNTER — Encounter: Payer: Self-pay | Admitting: Family Medicine

## 2021-05-16 ENCOUNTER — Other Ambulatory Visit: Payer: Self-pay

## 2021-05-16 VITALS — BP 130/80 | HR 82 | Temp 98.8°F | Ht 71.0 in | Wt 169.6 lb

## 2021-05-16 DIAGNOSIS — E871 Hypo-osmolality and hyponatremia: Secondary | ICD-10-CM | POA: Diagnosis not present

## 2021-05-16 DIAGNOSIS — Z23 Encounter for immunization: Secondary | ICD-10-CM

## 2021-05-16 LAB — BASIC METABOLIC PANEL
BUN: 27 mg/dL — ABNORMAL HIGH (ref 6–23)
CO2: 27 mEq/L (ref 19–32)
Calcium: 9.1 mg/dL (ref 8.4–10.5)
Chloride: 104 mEq/L (ref 96–112)
Creatinine, Ser: 1.08 mg/dL (ref 0.40–1.50)
GFR: 66.05 mL/min (ref >60.00)
Glucose, Bld: 103 mg/dL — ABNORMAL HIGH (ref 70–99)
Potassium: 4.1 mEq/L (ref 3.5–5.1)
Sodium: 139 mEq/L (ref 135–145)

## 2021-05-16 NOTE — Assessment & Plan Note (Signed)
Patient has had ongoing issues with hyponatremia.  This could be medication based and we will certainly see if it improves with him being off of Aldactone.  He seems to be adequately hydrated based on his urine output so I doubt his hyponatremia is related to that though we will check his kidney function today.  We will recheck his sodium level.  He will maintain his current hydration status.  He will follow-up in 6 weeks.

## 2021-05-16 NOTE — Progress Notes (Signed)
Jordan Rumps, MD Phone: [phone]  Jordan Perkins is a 80 y.o. male who presents today for hospital follow-up.  Hyponatremia: The patient was found to be hyponatremic after his last office visit.  He was admitted to the hospital and started on IV fluids.  It was felt as though his hyponatremia may have been dehydration related.  He was discharged on sodium chloride tablets and will he notes he finished those last week.  He is drinking 5 glasses of water a day as well as 3 glasses of milk a day.  He notes his urine is clear.  He urinates 8-9 times per day.  He notes no confusion.  He feels that his balance is getting better and he has been working with physical therapy on that.  He has no longer on Aldactone.  He does note some lower extremity swelling that improves as the day goes on.  Social History   Tobacco Use  Smoking Status Never  Smokeless Tobacco Never    Current Outpatient Medications on File Prior to Visit  Medication Sig Dispense Refill   atorvastatin (LIPITOR) 80 MG tablet TAKE 1 TABLET BY MOUTH EVERY DAY 90 tablet 0   ferrous sulfate 325 (65 FE) MG tablet TAKE 1 TABLET BY MOUTH EVERY DAY 90 tablet 1   pantoprazole (PROTONIX) 40 MG tablet Take 1 tablet (40 mg total) by mouth 2 (two) times daily. 60 tablet 0   rivaroxaban (XARELTO) 20 MG TABS tablet Take 1 tablet (20 mg total) by mouth daily with supper. 30 tablet 0   sodium chloride 1 g tablet Take 1 tablet (1 g total) by mouth 2 (two) times daily with a meal. 20 tablet 0   torsemide (DEMADEX) 20 MG tablet Take 1 tablet (20 mg total) by mouth daily. 90 tablet 3   No current facility-administered medications on file prior to visit.     ROS see history of present illness  Objective  Physical Exam Vitals:   05/16/21 1305  BP: 130/80  Pulse: 82  Temp: 98.8 F (37.1 C)  SpO2: 98%    BP Readings from Last 3 Encounters:  05/16/21 130/80  04/23/21 (!) 129/52  04/21/21 134/76   Wt Readings from Last 3  Encounters:  05/16/21 169 lb 9.6 oz (76.9 kg)  04/21/21 160 lb 15 oz (73 kg)  04/21/21 161 lb 9.6 oz (73.3 kg)    Physical Exam Constitutional:      General: He is not in acute distress.    Appearance: He is not diaphoretic.  Cardiovascular:     Rate and Rhythm: Normal rate and regular rhythm.     Heart sounds: Normal heart sounds.  Pulmonary:     Effort: Pulmonary effort is normal.     Breath sounds: Normal breath sounds.  Musculoskeletal:     Comments: Trace edema lower extremities at the shins  Skin:    General: Skin is warm and dry.  Neurological:     Mental Status: He is alert.     Assessment/Plan: Please see individual problem list.  Problem List Items Addressed This Visit     Hyponatremia - Primary    Patient has had ongoing issues with hyponatremia.  This could be medication based and we will certainly see if it improves with him being off of Aldactone.  He seems to be adequately hydrated based on his urine output so I doubt his hyponatremia is related to that though we will check his kidney function today.  We will recheck  his sodium level.  He will maintain his current hydration status.  He will follow-up in 6 weeks.      Relevant Orders   Basic Metabolic Panel (BMET)   Other Visit Diagnoses     Need for immunization against influenza       Relevant Orders   Flu Vaccine QUAD High Dose(Fluad) (Completed)       Return in about 6 weeks (around 06/27/2021) for Is is.  This visit occurred during the SARS-CoV-2 public health emergency.  Safety protocols were in place, including screening questions prior to the visit, additional usage of staff PPE, and extensive cleaning of exam room while observing appropriate contact time as indicated for disinfecting solutions.    Jordan Rumps, MD [place]

## 2021-05-16 NOTE — Patient Instructions (Signed)
Nice to see you. We will get some blood work today. I will see you back in 6 weeks.

## 2021-05-17 ENCOUNTER — Other Ambulatory Visit: Payer: Self-pay | Admitting: Family Medicine

## 2021-05-17 DIAGNOSIS — I700 Atherosclerosis of aorta: Secondary | ICD-10-CM | POA: Diagnosis not present

## 2021-05-17 DIAGNOSIS — I272 Pulmonary hypertension, unspecified: Secondary | ICD-10-CM | POA: Diagnosis not present

## 2021-05-17 DIAGNOSIS — K219 Gastro-esophageal reflux disease without esophagitis: Secondary | ICD-10-CM | POA: Diagnosis not present

## 2021-05-17 DIAGNOSIS — R531 Weakness: Secondary | ICD-10-CM | POA: Diagnosis not present

## 2021-05-17 DIAGNOSIS — E871 Hypo-osmolality and hyponatremia: Secondary | ICD-10-CM

## 2021-05-17 DIAGNOSIS — I2510 Atherosclerotic heart disease of native coronary artery without angina pectoris: Secondary | ICD-10-CM | POA: Diagnosis not present

## 2021-05-17 DIAGNOSIS — I110 Hypertensive heart disease with heart failure: Secondary | ICD-10-CM | POA: Diagnosis not present

## 2021-05-17 DIAGNOSIS — D509 Iron deficiency anemia, unspecified: Secondary | ICD-10-CM | POA: Diagnosis not present

## 2021-05-17 DIAGNOSIS — R918 Other nonspecific abnormal finding of lung field: Secondary | ICD-10-CM | POA: Diagnosis not present

## 2021-05-17 DIAGNOSIS — I5030 Unspecified diastolic (congestive) heart failure: Secondary | ICD-10-CM | POA: Diagnosis not present

## 2021-05-18 DIAGNOSIS — E871 Hypo-osmolality and hyponatremia: Secondary | ICD-10-CM | POA: Diagnosis not present

## 2021-05-18 DIAGNOSIS — R531 Weakness: Secondary | ICD-10-CM | POA: Diagnosis not present

## 2021-05-18 DIAGNOSIS — I5030 Unspecified diastolic (congestive) heart failure: Secondary | ICD-10-CM | POA: Diagnosis not present

## 2021-05-18 DIAGNOSIS — I110 Hypertensive heart disease with heart failure: Secondary | ICD-10-CM | POA: Diagnosis not present

## 2021-05-18 DIAGNOSIS — K219 Gastro-esophageal reflux disease without esophagitis: Secondary | ICD-10-CM | POA: Diagnosis not present

## 2021-05-18 DIAGNOSIS — I700 Atherosclerosis of aorta: Secondary | ICD-10-CM | POA: Diagnosis not present

## 2021-05-18 DIAGNOSIS — R918 Other nonspecific abnormal finding of lung field: Secondary | ICD-10-CM | POA: Diagnosis not present

## 2021-05-18 DIAGNOSIS — I272 Pulmonary hypertension, unspecified: Secondary | ICD-10-CM | POA: Diagnosis not present

## 2021-05-18 DIAGNOSIS — I2510 Atherosclerotic heart disease of native coronary artery without angina pectoris: Secondary | ICD-10-CM | POA: Diagnosis not present

## 2021-05-18 DIAGNOSIS — D509 Iron deficiency anemia, unspecified: Secondary | ICD-10-CM | POA: Diagnosis not present

## 2021-05-23 ENCOUNTER — Telehealth: Payer: Self-pay

## 2021-05-23 NOTE — Telephone Encounter (Signed)
115 pm.  Phone call made to patient to schedule a home visit.  Patient is agreeable to Thursday at 9 am.

## 2021-05-24 DIAGNOSIS — D509 Iron deficiency anemia, unspecified: Secondary | ICD-10-CM | POA: Diagnosis not present

## 2021-05-24 DIAGNOSIS — E871 Hypo-osmolality and hyponatremia: Secondary | ICD-10-CM | POA: Diagnosis not present

## 2021-05-24 DIAGNOSIS — I700 Atherosclerosis of aorta: Secondary | ICD-10-CM | POA: Diagnosis not present

## 2021-05-24 DIAGNOSIS — R531 Weakness: Secondary | ICD-10-CM | POA: Diagnosis not present

## 2021-05-24 DIAGNOSIS — I5030 Unspecified diastolic (congestive) heart failure: Secondary | ICD-10-CM | POA: Diagnosis not present

## 2021-05-24 DIAGNOSIS — K219 Gastro-esophageal reflux disease without esophagitis: Secondary | ICD-10-CM | POA: Diagnosis not present

## 2021-05-24 DIAGNOSIS — I110 Hypertensive heart disease with heart failure: Secondary | ICD-10-CM | POA: Diagnosis not present

## 2021-05-24 DIAGNOSIS — I2510 Atherosclerotic heart disease of native coronary artery without angina pectoris: Secondary | ICD-10-CM | POA: Diagnosis not present

## 2021-05-24 DIAGNOSIS — I272 Pulmonary hypertension, unspecified: Secondary | ICD-10-CM | POA: Diagnosis not present

## 2021-05-24 DIAGNOSIS — R918 Other nonspecific abnormal finding of lung field: Secondary | ICD-10-CM | POA: Diagnosis not present

## 2021-05-25 ENCOUNTER — Other Ambulatory Visit: Payer: Medicare HMO

## 2021-05-25 ENCOUNTER — Other Ambulatory Visit: Payer: Self-pay

## 2021-05-25 VITALS — BP 130/70 | HR 53 | Temp 97.3°F | Resp 16 | Wt 166.0 lb

## 2021-05-25 DIAGNOSIS — Z515 Encounter for palliative care: Secondary | ICD-10-CM

## 2021-05-25 NOTE — Progress Notes (Signed)
PATIENT NAME: Jordan Perkins DOB: [birth year] MRN: [id number]  PRIMARY CARE PROVIDER: [name], MD  RESPONSIBLE PARTY:  Acct ID - Guarantor Home Phone Work Phone Relationship Acct Type  [id number] - [phone]  Self P/F     [place], [address], [place] [address]    PLAN OF CARE and INTERVENTIONS:               1.  GOALS OF CARE/ ADVANCE CARE PLANNING:  Remain home with the assistance of family and friends.               2.  PATIENT/CAREGIVER EDUCATION:  Safety               4. PERSONAL EMERGENCY PLAN:  Activate 911 for emergencies.                5.  DISEASE STATUS:  Patient found sitting in his recliner chair and awake.  Reviewed chart and patient's last visit with PCP.  Patient aware of Na+ being therapeutic and notes he has been feeling better.  Patient appreciates [employer] [employer] assisting with transportation to this appointment and would like assistance in established transportation to cardiology visit on 05/30/21 @ 1440 pm.  Phone call made to [facility] @ 915 am to set up transport.  Transportation established and patient will be picked up on 05/30/21 at 145 pm.  Patient aware to be ready before this time.   Patient is using a cane for safe mobility in/out of the home.  He denies any falls.  No longer using the rolling walker and continues with Physical Therapy 1 x weekly but patient is uncertain which agency.  Discussion on safety completed with patient.  Patient is receiving Meals on Wheels, he is cooking some meals and family is also bringing meals in on the weekends.  Patient endorses a good appetite.  Weight loss of 3 lbs noted over the last month.  Patient denies any new issues with shortness of breath, palpitations, chest pain, dizziness, nausea or vomiting.   He continues to weigh himself daily and notes an improvement in lower extremity edema.  None present this am.    HISTORY OF PRESENT ILLNESS:  80 year old male with a history of CHF and  Cirrhosis of the Liver.  Patient is being followed by Palliative Care monthly and PRN.   CODE STATUS: Full ADVANCED DIRECTIVES: No MOST FORM: Yes PPS: 50%   PHYSICAL EXAM:   VITALS: Today's Vitals   05/25/21 0858  BP: 130/70  Pulse: (!) 53  Resp: 16  Temp: (!) 97.3 F (36.3 C)  SpO2: 99%  Weight: 166 lb (75.3 kg)  PainSc: 0-No pain    LUNGS: clear to auscultation  CARDIAC: Cor irreg, irreg RRR}  EXTREMITIES: - for edema SKIN: Skin color, texture, turgor normal. No rashes or lesions or normal  NEURO: positive for gait problems       Lorenza Burton, RN

## 2021-05-30 ENCOUNTER — Other Ambulatory Visit: Payer: Self-pay

## 2021-05-30 ENCOUNTER — Encounter: Payer: Self-pay | Admitting: Cardiovascular Disease

## 2021-05-30 ENCOUNTER — Ambulatory Visit (INDEPENDENT_AMBULATORY_CARE_PROVIDER_SITE_OTHER): Payer: Medicare HMO | Admitting: Cardiovascular Disease

## 2021-05-30 VITALS — BP 140/72 | HR 66 | Ht 71.0 in | Wt 165.1 lb

## 2021-05-30 DIAGNOSIS — I4811 Longstanding persistent atrial fibrillation: Secondary | ICD-10-CM | POA: Diagnosis not present

## 2021-05-30 DIAGNOSIS — I739 Peripheral vascular disease, unspecified: Secondary | ICD-10-CM

## 2021-05-30 DIAGNOSIS — Z01810 Encounter for preprocedural cardiovascular examination: Secondary | ICD-10-CM

## 2021-05-30 DIAGNOSIS — Z01818 Encounter for other preprocedural examination: Secondary | ICD-10-CM | POA: Diagnosis not present

## 2021-05-30 DIAGNOSIS — I779 Disorder of arteries and arterioles, unspecified: Secondary | ICD-10-CM

## 2021-05-30 DIAGNOSIS — E782 Mixed hyperlipidemia: Secondary | ICD-10-CM

## 2021-05-30 DIAGNOSIS — I25118 Atherosclerotic heart disease of native coronary artery with other forms of angina pectoris: Secondary | ICD-10-CM | POA: Diagnosis not present

## 2021-05-30 DIAGNOSIS — I10 Essential (primary) hypertension: Secondary | ICD-10-CM

## 2021-05-30 NOTE — Progress Notes (Signed)
Date:  05/30/2021   ID:  Jordan Perkins, DOB Oct 02, 1940, MRN [id number]  Patient Location:  [place] [address]   Provider location:   [facility], [place] office  PCP:  Jordan Haven, MD  Cardiologist:  Arvid Right [facility]  Chief Complaint  Patient presents with   [facility] follow up     "Doing well." Medications reviewed by the patient's bottles.     History of Present Illness:    Jordan Perkins is a 80 y.o. male  past medical history of CAD,  bypass surgery, 2005 at [facility] stents in 2005,  hypertension,  hyperlipidemia,  Chronic atrial fibrillation , on xarelto hospital with shortness of breath and chest pain, rapid atrial fibrillation.  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  Lives with his brother Friend drives him Reports unable to drive to [facility] for appointments, previously referred to advanced heart failure clinic for pulmonary hypertension  In the hospital 04/2021, weak, sodium low, 120 Given IVF, sodium improved by increasing his oral salt intake Was discharged back on his torsemide Now sodium 139  History of chronic anemia, Last hemoglobin 9.9 , chronic  Other lab work reviewed Creatinine 1.08, BUN 27  Reviewed echo as detailed below, elevated right heart pressures, dilated IVC  Echo 01/2021 reviewed with him Right heart pressures estimated 100 mmHg on echo  1. Left ventricular ejection fraction, by estimation, is 55 to 60%. The  left ventricle has  normal function. The left ventricle has no regional  wall motion abnormalities. Left ventricular diastolic parameters are  consistent with Grade II diastolic  dysfunction (pseudonormalization).   2. Right ventricular systolic function is mildly reduced. The right  ventricular size is moderately enlarged.   3. Left atrial size was severely dilated.   4. Right atrial size was moderately dilated.   5. The mitral valve is degenerative. Mild mitral valve regurgitation.   6. The aortic valve is tricuspid. Aortic valve regurgitation is not  visualized. Mild aortic valve sclerosis is present, with no evidence of  aortic valve stenosis.   7. The inferior vena cava is dilated in size with <50% respiratory  variability, suggesting right atrial pressure of 15 mmHg.    EKG personally reviewed by myself on todays visit Atrial fibrillation rate 67 bpm  Echocardiogram July 2021 ejection fraction 60 to 65% Severely elevated right heart pressures   Other past medical history reviewed Zio: Atrial Fibrillation occurred continuously (100% burden), ranging from 32-104 bpm (avg of 56 bpm). 2 Ventricular Tachycardia runs occurred, the run with the fastest interval lasting 7 beats with a max rate of 171 bpm (avg 117 bpm);  the run with the fastest interval was also the longest.  Isolated VEs were occasional (2.6%, 28107), VE Couplets were rare (<1.0%, 393), and VE Triplets were rare (<1.0%, 14). Ventricular Bigeminy and Trigeminy were present.  Hospital admission 10/2016 Hyponatremia, weakness, elevated T.Bili (gilberts)   hospital admission in 2014, noted to have gallstones.   negative Hida scan. Now s/p  Cholecystectomy 11/14  CT scan in the hospital of his abdomen and pelvis showed acute pancreatitis with increased fluid density in the peri-pancreatic fat, multiple gallstones, small hiatal hernia  Prior CV studies:   The following studies were reviewed today:  Echocardiogram showed ejection  fraction 60-65%, mild to moderate TR, moderately elevated right ventricular systolic pressures  Past Medical History:  Diagnosis Date   (HFpEF) heart failure with preserved ejection fraction (HCC)    CHF (congestive heart failure) ([place])    Coronary artery disease    Hyperlipidemia    Hypertension    PAH (pulmonary artery hypertension) (HCC)    Permanent atrial fibrillation (HCC)    Pleural effusion    Past Surgical History:  Procedure Laterality Date   CARDIAC CATHETERIZATION     CHOLECYSTECTOMY     COLONOSCOPY WITH PROPOFOL N/A 08/23/2020   Procedure: COLONOSCOPY WITH PROPOFOL;  Surgeon: [name], MD;  Location: [facility] ENDOSCOPY;  Service: Gastroenterology;  Laterality: N/A;   CORONARY ANGIOPLASTY  06/2004   s/p stent placement @ [facility]   CORONARY ARTERY BYPASS GRAFT  04-24-2004   CABG x 3 [facility]   ESOPHAGOGASTRODUODENOSCOPY (EGD) WITH PROPOFOL N/A 08/23/2020   Procedure: ESOPHAGOGASTRODUODENOSCOPY (EGD) WITH PROPOFOL;  Surgeon: [name], MD;  Location: [place];  Service: Gastroenterology;  Laterality: N/A;  Needs to be last case     Current Meds  Medication Sig   atorvastatin (LIPITOR) 80 MG tablet TAKE 1 TABLET BY MOUTH EVERY DAY   ferrous sulfate 325 (65 FE) MG tablet TAKE 1 TABLET BY MOUTH EVERY DAY   pantoprazole (PROTONIX) 40 MG tablet Take 1 tablet (40 mg total) by mouth 2 (two) times daily.   rivaroxaban (XARELTO) 20 MG TABS tablet Take 1 tablet (20 mg total) by mouth daily with supper.   sodium chloride 1 g tablet Take 1 tablet (1 g total) by mouth 2 (two) times daily with a meal.   torsemide (DEMADEX) 20 MG tablet Take 1 tablet (20 mg total) by mouth daily.     Allergies:   Sulfa antibiotics and Other   Social History   Tobacco Use   Smoking status: Never   Smokeless tobacco: Never  Vaping Use   Vaping Use: Never used  Substance Use Topics   Alcohol use: No   Drug use: No      Family Hx: The patient's family history includes Heart  attack in his brother; Heart disease in his father and paternal uncle.  ROS:   Review of Systems  Constitutional: Negative.   HENT: Negative.    Respiratory: Negative.    Cardiovascular: Negative.   Gastrointestinal: Negative.   Musculoskeletal: Negative.   Neurological: Negative.   Psychiatric/Behavioral: Negative.    All other systems reviewed and are negative.   Labs/Other Tests and Data Reviewed:    Recent Labs: 04/21/2021: ALT 15 04/23/2021: Hemoglobin 9.9; Magnesium 1.7; Platelets 171 05/16/2021: BUN 27; Creatinine, Ser 1.08; Potassium 4.1; Sodium 139   Recent Lipid Panel Lab Results  Component Value Date/Time   CHOL 113 04/21/2021 01:33 PM   CHOL 89 (L) 04/06/2015 04:42 PM   CHOL 150 06/06/2013 09:47 AM   TRIG 46.0 04/21/2021 01:33 PM   TRIG 85 06/06/2013 09:47 AM   HDL 58.70 04/21/2021 01:33 PM   HDL 40 04/06/2015 04:42 PM   HDL 43 06/06/2013 09:47 AM   CHOLHDL 2 04/21/2021 01:33 PM   LDLCALC 45 04/21/2021 01:33 PM   LDLCALC 36 04/06/2015 04:42 PM   LDLCALC 90 06/06/2013 09:47 AM  Wt Readings from Last 3 Encounters:  05/30/21 165 lb 2 oz (74.9 kg)  05/25/21 166 lb (75.3 kg)  05/16/21 169 lb 9.6 oz (76.9 kg)     Exam:    BP 140/72 (BP Location: Left Arm, Patient Position: Sitting, Cuff Size: Normal)   Pulse 66   Ht 5\' 11"  (1.803 m)   Wt 165 lb 2 oz (74.9 kg)   SpO2 98%   BMI 23.03 kg/m  Constitutional:  oriented to person, place, and time. No distress.  HENT:  Head: Grossly normal Eyes:  no discharge. No scleral icterus.  Neck: No JVD, no carotid bruits  Cardiovascular: Regular rate and rhythm, no murmurs appreciated Pulmonary/Chest: Clear to auscultation bilaterally, no wheezes or rails Abdominal: Soft.  no distension.  no tenderness.  Musculoskeletal: Normal range of motion Neurological:  normal muscle tone. Coordination normal. No atrophy Skin: Skin warm and dry Psychiatric: normal affect, pleasant   ASSESSMENT & PLAN:    Chronic diastolic  CHF/pulmonary hypertension Long discussion with him, concern for elevated pressures based off echocardiogram in 2021 and May 2022 Need to definitively clarify right heart pressures We have recommended right heart catheterization in [place], will schedule for next week -Continue torsemide 20 daily Difficulty driving to [facility] for appointments  Atrial fibrillation, unspecified type ([place]) Permanent atrial fibrillation On anticoagulation,  Rate controlled, not on beta-blocker  Coronary artery disease of native artery of native heart with stable angina pectoris ([place]) Currently with no symptoms of angina. No further workup at this time. Continue current medication regimen.  PAD (peripheral artery disease) (HCC) Denies claudication symptoms  Bilateral carotid artery disease, unspecified type ([place]) Cholesterol at goal Mild disease on left  S/P CABG (coronary artery bypass graft) Denies anginal symptoms  Essential hypertension Blood pressure is well controlled on today's visit. No changes made to the medications.    Total encounter time more than 35 minutes  Greater than 50% was spent in counseling and coordination of care with the patient   Signed, Ida Rogue, MD  05/30/2021 3:40 PM    [place] Office [address] [place] [address], [place], [place] [address]

## 2021-05-30 NOTE — H&P (View-Only) (Signed)
Date:  05/30/2021   ID:  [name], DOB [birth year], MRN [id number]  Patient Location:  [place] [address]   Provider location:   [facility], [place] office  PCP:  Jordan Haven, MD  Cardiologist:  Jordan Perkins [facility]  Chief Complaint  Patient presents with   [facility] follow up     "Doing well." Medications reviewed by the patient's bottles.     History of Present Illness:    Jordan Perkins is a 80 y.o. male  past medical history of CAD,  bypass surgery, 2005 at [facility] stents in 2005,  hypertension,  hyperlipidemia,  Chronic atrial fibrillation , on xarelto hospital with shortness of breath and chest pain, rapid atrial fibrillation.  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  Lives with his brother Friend drives him Reports unable to drive to [facility] for appointments, previously referred to advanced heart failure clinic for pulmonary hypertension  In the hospital 04/2021, weak, sodium low, 120 Given IVF, sodium improved by increasing his oral salt intake Was discharged back on his torsemide Now sodium 139  History of chronic anemia, Last hemoglobin 9.9 , chronic  Other lab work reviewed Creatinine 1.08, BUN 27  Reviewed echo as detailed below, elevated Perkins heart pressures, dilated IVC  Echo 01/2021 reviewed with him Perkins heart pressures estimated 100 mmHg on echo  1. Left ventricular ejection fraction, by estimation, is 55 to 60%. The  left ventricle has  normal function. The left ventricle has no regional  wall motion abnormalities. Left ventricular diastolic parameters are  consistent with Grade II diastolic  dysfunction (pseudonormalization).   2. Perkins ventricular systolic function is mildly reduced. The Perkins  ventricular size is moderately enlarged.   3. Left atrial size was severely dilated.   4. Perkins atrial size was moderately dilated.   5. The mitral valve is degenerative. Mild mitral valve regurgitation.   6. The aortic valve is tricuspid. Aortic valve regurgitation is not  visualized. Mild aortic valve sclerosis is present, with no evidence of  aortic valve stenosis.   7. The inferior vena cava is dilated in size with <50% respiratory  variability, suggesting Perkins atrial pressure of 15 mmHg.    EKG personally reviewed by myself on todays visit Atrial fibrillation rate 67 bpm  Echocardiogram July 2021 ejection fraction 60 to 65% Severely elevated Perkins heart pressures   Other past medical history reviewed Zio: Atrial Fibrillation occurred continuously (100% burden), ranging from 32-104 bpm (avg of 56 bpm). 2 Ventricular Tachycardia runs occurred, the run with the fastest interval lasting 7 beats with a max rate of 171 bpm (avg 117 bpm);  the run with the fastest interval was also the longest.  Isolated VEs were occasional (2.6%, 28107), VE Couplets were rare (<1.0%, 393), and VE Triplets were rare (<1.0%, 14). Ventricular Bigeminy and Trigeminy were present.  Hospital admission 10/2016 Hyponatremia, weakness, elevated T.Bili (gilberts)   hospital admission in 2014, noted to have gallstones.   negative Hida scan. Now s/p  Cholecystectomy 11/14  CT scan in the hospital of his abdomen and pelvis showed acute pancreatitis with increased fluid density in the peri-pancreatic fat, multiple gallstones, small hiatal hernia  Prior CV studies:   The following studies were reviewed today:  Echocardiogram showed ejection  fraction 60-65%, mild to moderate TR, moderately elevated Perkins ventricular systolic pressures  Past Medical History:  Diagnosis Date   (HFpEF) heart failure with preserved ejection fraction (HCC)    CHF (congestive heart failure) ([place])    Coronary artery disease    Hyperlipidemia    Hypertension    PAH (pulmonary artery hypertension) (HCC)    Permanent atrial fibrillation (HCC)    Pleural effusion    Past Surgical History:  Procedure Laterality Date   CARDIAC CATHETERIZATION     CHOLECYSTECTOMY     COLONOSCOPY WITH PROPOFOL N/A 08/23/2020   Procedure: COLONOSCOPY WITH PROPOFOL;  Surgeon: Jordan Landsman, MD;  Location: [facility] ENDOSCOPY;  Service: Gastroenterology;  Laterality: N/A;   CORONARY ANGIOPLASTY  06/2004   s/p stent placement @ [facility]   CORONARY ARTERY BYPASS GRAFT  04-24-2004   CABG x 3 [facility]   ESOPHAGOGASTRODUODENOSCOPY (EGD) WITH PROPOFOL N/A 08/23/2020   Procedure: ESOPHAGOGASTRODUODENOSCOPY (EGD) WITH PROPOFOL;  Surgeon: Jordan Landsman, MD;  Location: [place];  Service: Gastroenterology;  Laterality: N/A;  Needs to be last case     Current Meds  Medication Sig   atorvastatin (LIPITOR) 80 MG tablet TAKE 1 TABLET BY MOUTH EVERY DAY   ferrous sulfate 325 (65 FE) MG tablet TAKE 1 TABLET BY MOUTH EVERY DAY   pantoprazole (PROTONIX) 40 MG tablet Take 1 tablet (40 mg total) by mouth 2 (two) times daily.   rivaroxaban (XARELTO) 20 MG TABS tablet Take 1 tablet (20 mg total) by mouth daily with supper.   sodium chloride 1 g tablet Take 1 tablet (1 g total) by mouth 2 (two) times daily with a meal.   torsemide (DEMADEX) 20 MG tablet Take 1 tablet (20 mg total) by mouth daily.     Allergies:   Sulfa antibiotics and Other   Social History   Tobacco Use   Smoking status: Never   Smokeless tobacco: Never  Vaping Use   Vaping Use: Never used  Substance Use Topics   Alcohol use: No   Drug use: No      Family Hx: The patient's family history includes Heart  attack in his brother; Heart disease in his father and paternal uncle.  ROS:   Review of Systems  Constitutional: Negative.   HENT: Negative.    Respiratory: Negative.    Cardiovascular: Negative.   Gastrointestinal: Negative.   Musculoskeletal: Negative.   Neurological: Negative.   Psychiatric/Behavioral: Negative.    All other systems reviewed and are negative.   Labs/Other Tests and Data Reviewed:    Recent Labs: 04/21/2021: ALT 15 04/23/2021: Hemoglobin 9.9; Magnesium 1.7; Platelets 171 05/16/2021: BUN 27; Creatinine, Ser 1.08; Potassium 4.1; Sodium 139   Recent Lipid Panel Lab Results  Component Value Date/Time   CHOL 113 04/21/2021 01:33 PM   CHOL 89 (L) 04/06/2015 04:42 PM   CHOL 150 06/06/2013 09:47 AM   TRIG 46.0 04/21/2021 01:33 PM   TRIG 85 06/06/2013 09:47 AM   HDL 58.70 04/21/2021 01:33 PM   HDL 40 04/06/2015 04:42 PM   HDL 43 06/06/2013 09:47 AM   CHOLHDL 2 04/21/2021 01:33 PM   LDLCALC 45 04/21/2021 01:33 PM   LDLCALC 36 04/06/2015 04:42 PM   LDLCALC 90 06/06/2013 09:47 AM  Wt Readings from Last 3 Encounters:  05/30/21 165 lb 2 oz (74.9 kg)  05/25/21 166 lb (75.3 kg)  05/16/21 169 lb 9.6 oz (76.9 kg)     Exam:    BP 140/72 (BP Location: Left Arm, Patient Position: Sitting, Cuff Size: Normal)   Pulse 66   Ht 5\' 11"  (1.803 m)   Wt 165 lb 2 oz (74.9 kg)   SpO2 98%   BMI 23.03 kg/m  Constitutional:  oriented to person, place, and time. No distress.  HENT:  Head: Grossly normal Eyes:  no discharge. No scleral icterus.  Neck: No JVD, no carotid bruits  Cardiovascular: Regular rate and rhythm, no murmurs appreciated Pulmonary/Chest: Clear to auscultation bilaterally, no wheezes or rails Abdominal: Soft.  no distension.  no tenderness.  Musculoskeletal: Normal range of motion Neurological:  normal muscle tone. Coordination normal. No atrophy Skin: Skin warm and dry Psychiatric: normal affect, pleasant   ASSESSMENT & PLAN:    Chronic diastolic  CHF/pulmonary hypertension Long discussion with him, concern for elevated pressures based off echocardiogram in 2021 and May 2022 Need to definitively clarify Perkins heart pressures We have recommended Perkins heart catheterization in [place], will schedule for next week -Continue torsemide 20 daily Difficulty driving to [facility] for appointments  Atrial fibrillation, unspecified type ([place]) Permanent atrial fibrillation On anticoagulation,  Rate controlled, not on beta-blocker  Coronary artery disease of native artery of native heart with stable angina pectoris ([place]) Currently with no symptoms of angina. No further workup at this time. Continue current medication regimen.  PAD (peripheral artery disease) (HCC) Denies claudication symptoms  Bilateral carotid artery disease, unspecified type ([place]) Cholesterol at goal Mild disease on left  S/P CABG (coronary artery bypass graft) Denies anginal symptoms  Essential hypertension Blood pressure is well controlled on today's visit. No changes made to the medications.    Total encounter time more than 35 minutes  Greater than 50% was spent in counseling and coordination of care with the patient   Signed, Ida Rogue, MD  05/30/2021 3:40 PM    [place] Office [address] [place] [address], [place], [place] [address]

## 2021-05-30 NOTE — Patient Instructions (Addendum)
Medication Instructions:  No changes  If you need a refill on your cardiac medications before your next appointment, please call your pharmacy.   Lab work: CBC & BMET (pre-procedure)  Testing/Procedures: Right Heart Cath  Follow-Up: At [facility], you and your health needs are our priority.  As part of our continuing mission to provide you with exceptional heart care, we have created designated Provider Care Teams.  These Care Teams include your primary Cardiologist (physician) and Advanced Practice Providers (APPs -  Physician Assistants and Nurse Practitioners) who all work together to provide you with the care you need, when you need it.  You will need a follow up appointment in 6 months  Providers on your designated Care Team:   Murray Hodgkins, NP Christell Faith, PA-C Marrianne Mood, PA-C Cadence [place], [place]   COVID-19 Vaccine Information can be found at: ShippingScam.co.uk For questions related to vaccine distribution or appointments, please email vaccine@[place] .com or call [phone].   [facility] Cardiac Cath Instructions  You are scheduled for a Cardiac Cath on: 06/09/2021 Please arrive at 09:30 am on the day of your procedure Your start time is 10:30 am Please expect a call from our [facility] to pre-register you Do not eat/drink anything after midnight Someone will need to drive you home It is recommended someone be with you for the first 24 hours after your procedure Wear clothes that are easy to get on/off and wear slip on shoes if possible  Please bring a current list of all medications with you  _X__ You may take all of your medications the morning of your procedure with enough water to swallow safely  ___ DO NOT take these medications before your procedure: None, may take your medications  ___ Please STOP Xarelto for 2 days  Stop on 9/28 Wednesday   For your safety and being  able to monitor your condition, we request that they you do not wear nail polish, gel polish, artificial nails, or any other type of covering on natural nails including finger and toenails. If you have artificial nails, gel coating, etc.that requires to be removed by a nail saloon please have this removed prior to procedure. Your procedure may need to be canceled/ delayed if the performing provider/anesthesia team feels like the patient is unable to be adequately monitored.  Day of your procedure:  Arrive at the [facility] entrance.  Free valet service is available.  After entering the [facility] please check-in at the registration desk (1st desk on your right) to receive your armband. After receiving your armband someone will escort you to the cardiac cath/special procedures waiting area.  The usual length of stay after your procedure is about 2 to 3 hours.  This can vary.  If you have any questions, please call our office at [phone], or you may call the cardiac cath lab at [facility] directly at [phone]  Your physician has requested that you have a cardiac catheterization. Cardiac catheterization is used to diagnose and/or treat various heart conditions. Doctors may recommend this procedure for a number of different reasons. The most common reason is to evaluate chest pain. Chest pain can be a symptom of coronary artery disease (CAD), and cardiac catheterization can show whether plaque is narrowing or blocking your heart's arteries. This procedure is also used to evaluate the valves, as well as measure the blood flow and oxygen levels in different parts of your heart. For further information please visit HugeFiesta.tn. Please follow instruction sheet, as given.

## 2021-05-31 ENCOUNTER — Ambulatory Visit: Payer: Medicare HMO

## 2021-05-31 LAB — BASIC METABOLIC PANEL
BUN/Creatinine Ratio: 29 — ABNORMAL HIGH (ref 10–24)
BUN: 32 mg/dL — ABNORMAL HIGH (ref 8–27)
CO2: 22 mmol/L (ref 20–29)
Calcium: 9.8 mg/dL (ref 8.6–10.2)
Chloride: 99 mmol/L (ref 96–106)
Creatinine, Ser: 1.09 mg/dL (ref 0.76–1.27)
Glucose: 109 mg/dL — ABNORMAL HIGH (ref 65–99)
Potassium: 4 mmol/L (ref 3.5–5.2)
Sodium: 137 mmol/L (ref 134–144)
eGFR: 70 mL/min/{1.73_m2} (ref >59)

## 2021-05-31 LAB — CBC
Hematocrit: 31.3 % — ABNORMAL LOW (ref 37.5–51.0)
Hemoglobin: 11.1 g/dL — ABNORMAL LOW (ref 13.0–17.7)
MCH: 34.8 pg — ABNORMAL HIGH (ref 26.6–33.0)
MCHC: 35.5 g/dL (ref 31.5–35.7)
MCV: 98 fL — ABNORMAL HIGH (ref 79–97)
Platelets: 133 10*3/uL — ABNORMAL LOW (ref 150–450)
RBC: 3.19 x10E6/uL — ABNORMAL LOW (ref 4.14–5.80)
RDW: 14.1 % (ref 11.6–15.4)
WBC: 5.8 10*3/uL (ref 3.4–10.8)

## 2021-06-05 ENCOUNTER — Telehealth: Payer: Self-pay

## 2021-06-05 NOTE — Telephone Encounter (Signed)
Able to reach pt regarding his recent lab work, Dr. Rockey Situ had a chance to review their results and advised   "Lab work has not changed,  Within normal limits  Anemia improving "  Mr. Palazzi thankful for the call, no concerns at this time, will call back for anything further.

## 2021-06-07 ENCOUNTER — Telehealth: Payer: Self-pay

## 2021-06-07 ENCOUNTER — Other Ambulatory Visit: Payer: Self-pay | Admitting: Cardiovascular Disease

## 2021-06-07 DIAGNOSIS — I272 Pulmonary hypertension, unspecified: Secondary | ICD-10-CM

## 2021-06-07 NOTE — Telephone Encounter (Signed)
1236 pm.  Incoming call from Jordan Perkins.  He advises of a procedure on Friday and will need transportation.  Reviewed Epic notes and patient is scheduled for [facility] cardiac cath procedure on Friday.  Phone call made to [facility] to advise of need for Friday, Sept 30 with an arrival time of 930 am.  Transportation is scheduled with a pick up time of 845 am.  1246 pm.  Return call made to Jordan Perkins to advise of pick up time.  Reviewed instructions for cath lab.  Patient verbalizes understanding and is aware of 9/28 stop date for xarelto.  No further needs/questions voiced.

## 2021-06-07 NOTE — Telephone Encounter (Signed)
Reached out to Mrs. [place] regarding heart cath on Friday, 9/30 with Dr. Saunders Revel at [facility]. Making sure pt understood his instruction and to review Xarelto.   Pt verbalized understanding of heart cath instructions, pt confirmed has NOT taken his Xarelto today 9/28, will not take tomorrow, or Friday morning. Otherwise all questions or concerns were address and no additional concerns at this time. Will have family drive him to his procedure Friday morning.   Pt thankful for call.

## 2021-06-09 ENCOUNTER — Encounter
Admission: RE | Disposition: A | Discharge status: Home / Self Care | Payer: Self-pay | Source: Home / Self Care | Attending: Internal Medicine

## 2021-06-09 ENCOUNTER — Encounter: Payer: Self-pay | Admitting: Internal Medicine

## 2021-06-09 ENCOUNTER — Ambulatory Visit
Admission: RE | Admit: 2021-06-09 | Discharge: 2021-06-09 | Disposition: A | Discharge status: Home / Self Care | Payer: Medicare HMO | Attending: Internal Medicine | Admitting: Internal Medicine

## 2021-06-09 ENCOUNTER — Telehealth: Payer: Self-pay | Admitting: Internal Medicine

## 2021-06-09 DIAGNOSIS — I739 Peripheral vascular disease, unspecified: Secondary | ICD-10-CM | POA: Diagnosis not present

## 2021-06-09 DIAGNOSIS — Z79899 Other long term (current) drug therapy: Secondary | ICD-10-CM | POA: Diagnosis not present

## 2021-06-09 DIAGNOSIS — I5032 Chronic diastolic (congestive) heart failure: Secondary | ICD-10-CM | POA: Diagnosis not present

## 2021-06-09 DIAGNOSIS — I25118 Atherosclerotic heart disease of native coronary artery with other forms of angina pectoris: Secondary | ICD-10-CM

## 2021-06-09 DIAGNOSIS — E785 Hyperlipidemia, unspecified: Secondary | ICD-10-CM | POA: Insufficient documentation

## 2021-06-09 DIAGNOSIS — I4821 Permanent atrial fibrillation: Secondary | ICD-10-CM | POA: Insufficient documentation

## 2021-06-09 DIAGNOSIS — Z8249 Family history of ischemic heart disease and other diseases of the circulatory system: Secondary | ICD-10-CM | POA: Diagnosis not present

## 2021-06-09 DIAGNOSIS — Z951 Presence of aortocoronary bypass graft: Secondary | ICD-10-CM | POA: Diagnosis not present

## 2021-06-09 DIAGNOSIS — I272 Pulmonary hypertension, unspecified: Secondary | ICD-10-CM | POA: Diagnosis not present

## 2021-06-09 DIAGNOSIS — Z882 Allergy status to sulfonamides status: Secondary | ICD-10-CM | POA: Diagnosis not present

## 2021-06-09 DIAGNOSIS — Z7901 Long term (current) use of anticoagulants: Secondary | ICD-10-CM | POA: Diagnosis not present

## 2021-06-09 DIAGNOSIS — I110 Hypertensive heart disease with heart failure: Secondary | ICD-10-CM | POA: Diagnosis not present

## 2021-06-09 HISTORY — PX: RIGHT HEART CATH: CATH118263

## 2021-06-09 SURGERY — RIGHT HEART CATH
Anesthesia: Moderate Sedation

## 2021-06-09 SURGERY — RIGHT HEART CATH
Anesthesia: Moderate Sedation | Laterality: Right

## 2021-06-09 MED ORDER — ASPIRIN 81 MG PO CHEW: 81.0000 mg | CHEWABLE_TABLET | ORAL | Status: DC

## 2021-06-09 MED ORDER — HYDRALAZINE HCL 20 MG/ML IJ SOLN: 10.0000 mg | INTRAMUSCULAR | Status: DC | PRN

## 2021-06-09 MED ORDER — MIDAZOLAM HCL 2 MG/2ML IJ SOLN
INTRAMUSCULAR | Status: AC
  Filled 2021-06-09: qty 2

## 2021-06-09 MED ORDER — SODIUM CHLORIDE 0.9% FLUSH: 3.0000 mL | INTRAVENOUS | Status: DC | PRN

## 2021-06-09 MED ORDER — ONDANSETRON HCL 4 MG/2ML IJ SOLN: 4.0000 mg | Freq: Four times a day (QID) | INTRAMUSCULAR | Status: DC | PRN

## 2021-06-09 MED ORDER — TORSEMIDE 20 MG PO TABS: 20.0000 mg | ORAL_TABLET | Freq: Two times a day (BID) | ORAL | 5 refills | Status: DC

## 2021-06-09 MED ORDER — SODIUM CHLORIDE 0.9 % IV SOLN: 250.0000 mL | INTRAVENOUS | Status: DC | PRN

## 2021-06-09 MED ORDER — HEPARIN (PORCINE) IN NACL 1000-0.9 UT/500ML-% IV SOLN
INTRAVENOUS | Status: AC
  Filled 2021-06-09: qty 1000

## 2021-06-09 MED ORDER — SODIUM CHLORIDE 0.9% FLUSH: 3.0000 mL | Freq: Two times a day (BID) | INTRAVENOUS | Status: DC

## 2021-06-09 MED ORDER — ACETAMINOPHEN 325 MG PO TABS: 650.0000 mg | ORAL_TABLET | ORAL | Status: DC | PRN

## 2021-06-09 MED ORDER — HEPARIN (PORCINE) IN NACL 1000-0.9 UT/500ML-% IV SOLN
INTRAVENOUS | Status: DC | PRN
  Administered 2021-06-09: 500 mL

## 2021-06-09 MED ORDER — LIDOCAINE HCL (PF) 1 % IJ SOLN
INTRAMUSCULAR | Status: DC | PRN
  Administered 2021-06-09: 2 mL

## 2021-06-09 MED ORDER — LIDOCAINE HCL 1 % IJ SOLN
INTRAMUSCULAR | Status: AC
  Filled 2021-06-09: qty 20

## 2021-06-09 MED ORDER — SODIUM CHLORIDE 0.9 % IV SOLN: INTRAVENOUS | Status: DC

## 2021-06-09 MED ORDER — FENTANYL CITRATE (PF) 100 MCG/2ML IJ SOLN
INTRAMUSCULAR | Status: AC
  Filled 2021-06-09: qty 2

## 2021-06-09 SURGICAL SUPPLY — 5 items
CATH BALLN WEDGE 5F 110CM (CATHETERS) ×2 IMPLANT
DRAPE BRACHIAL (DRAPES) ×2 IMPLANT
KIT RIGHT HEART (MISCELLANEOUS) ×2 IMPLANT
PACK CARDIAC CATH (CUSTOM PROCEDURE TRAY) ×2 IMPLANT
SHEATH GLIDE SLENDER 4/5FR (SHEATH) ×2 IMPLANT

## 2021-06-09 NOTE — Interval H&P Note (Signed)
History and Physical Interval Note:  06/09/2021 11:27 AM  Jordan Perkins  has presented today for surgery, with the diagnosis of pulmonary hypertension.  The various methods of treatment have been discussed with the patient and family. After consideration of risks, benefits and other options for treatment, the patient has consented to  Procedure(s): RIGHT HEART CATH (Right) as a surgical intervention.  The patient's history has been reviewed, patient examined, no change in status, stable for surgery.  I have reviewed the patient's chart and labs.  Questions were answered to the patient's satisfaction.     Jordan Perkins

## 2021-06-09 NOTE — Telephone Encounter (Signed)
[facility] called to schedule 1 week fu labs and 2 week ov  Please advise as no lab orders entered

## 2021-06-10 ENCOUNTER — Other Ambulatory Visit: Payer: Self-pay | Admitting: Family Medicine

## 2021-06-12 ENCOUNTER — Encounter: Payer: Self-pay | Admitting: Internal Medicine

## 2021-06-12 NOTE — Telephone Encounter (Signed)
End, Harrell Gave, MD  P Cv Div Burl Scheduling; P Cv Div Burl Triage Cc: [name], MD Hello,   Could you arrange for [name] to be seen in the office in about 2 weeks with Dr. Rockey Situ or an APP for follow-up of his right heart catheterization?  He will also need a BMP in about a week, as I am increasing his torsemide today.  Thanks.   Gerald Stabs

## 2021-06-12 NOTE — Telephone Encounter (Signed)
Order for BMP placed an linked with 06/16/21 lab appt. F/U appt already schedule for 06/22/21 with Ignacia Bayley, NP

## 2021-06-13 NOTE — Telephone Encounter (Signed)
Pt has lab appt 10/7 for BMET. 2 week f/u scheduled with Ignacia Bayley, NP 10/13.

## 2021-06-16 ENCOUNTER — Other Ambulatory Visit: Payer: Medicare HMO

## 2021-06-19 ENCOUNTER — Telehealth: Payer: Self-pay | Admitting: Nurse Practitioner

## 2021-06-19 ENCOUNTER — Other Ambulatory Visit: Payer: Self-pay

## 2021-06-19 ENCOUNTER — Other Ambulatory Visit: Payer: Medicare HMO

## 2021-06-19 ENCOUNTER — Other Ambulatory Visit: Payer: Self-pay | Admitting: Cardiovascular Disease

## 2021-06-19 VITALS — BP 120/58 | HR 53 | Temp 98.4°F | Resp 20 | Wt 166.0 lb

## 2021-06-19 DIAGNOSIS — Z515 Encounter for palliative care: Secondary | ICD-10-CM

## 2021-06-19 NOTE — Telephone Encounter (Signed)
Per [facility] patient missed labs and has transportation issues   Notes in appt desk to get overdue labs at visit

## 2021-06-19 NOTE — Progress Notes (Signed)
PATIENT NAME: Jordan Perkins DOB: October 29, 1940 MRN: [id number]  PRIMARY CARE PROVIDER: [name], MD  RESPONSIBLE PARTY:  Acct ID - Guarantor Home Phone Work Phone Relationship Acct Type  [id number] SHERRY, [phone]  Self P/F     [address], [place], [place] [address]    PLAN OF CARE and INTERVENTIONS:               1.  GOALS OF CARE/ ADVANCE CARE PLANNING:  Remain in his home with the assistance of his family.               2.  PATIENT/CAREGIVER EDUCATION:  Transportation.               4. PERSONAL EMERGENCY PLAN:  Activate 911 for emergencies.               5.  DISEASE STATUS:  Joint visit completed with [place], SW.  Patient found sitting in the recliner chair with legs elevated.   Patient misses his appointment last week for blood work as he did not have transportation.  Phone call made to [place] office and advised of this issue.  Blood work can be drawn on Thursday per [place].  Patient notified of this and transportation set up with [facility] [employer] by [place], SW.  Encouraged patient to contact [place] or [facility] [employer] for future transportation needs.  Patient has both numbers.   Patient using a walker or cane when ambulating.  No recent falls.  Continues to prepare meals daily and family is bringing meals in over the weekend.    Denies insomnia and reports napping during the day.  Overall, patient feels he is doing well but needed assistance with transportation.   HISTORY OF PRESENT ILLNESS:  80 year old male with a history of CHF.  Patient is being followed by Palliative Care every 4-8 weeks.  CODE STATUS: Full ADVANCED DIRECTIVES: Yes MOST FORM: No PPS: 50%   PHYSICAL EXAM:   VITALS: Today's Vitals   06/19/21 1216  BP: (!) 120/58  Pulse: (!) 53  Resp: 20  Temp: 98.4 F (36.9 C)  SpO2: 98%  Weight: 166 lb (75.3 kg)  PainSc: 0-No pain    LUNGS: clear to auscultation  CARDIAC: Cor RRR}  EXTREMITIES: - for  edema SKIN: Skin color, texture, turgor normal. No rashes or lesions or dry skin   NEURO: positive for gait problems       Lorenza Burton, RN

## 2021-06-19 NOTE — Telephone Encounter (Signed)
Please see previous telephone encounter 06/09/21.  Pt to have repeat BMET.  Appt notes updated for ov 06/22/21 with Ignacia Bayley, NP to have BMET at visit due to overdue lab and transportation issues.

## 2021-06-19 NOTE — Progress Notes (Signed)
COMMUNITY PALLIATIVE CARE SW NOTE  PATIENT NAME: Jordan Perkins DOB: 1941/06/07 MRN: [id number]  PRIMARY CARE PROVIDER: [name], MD  RESPONSIBLE PARTY:  Acct ID - Guarantor Home Phone Work Phone Relationship Acct Type  [id number] KEIVON, [phone]  Self P/F     [address], [place], [place] [address]     PLAN OF CARE and INTERVENTIONS:             GOALS OF CARE/ ADVANCE CARE PLANNING:  Patient is now a FULL CODE. Patient's goal is to remain at home with brother.   2.         SOCIAL/EMOTIONAL/SPIRITUAL ASSESSMENT/ INTERVENTIONS:  SW and RN Almyra Free met with patient in patients home for follow up visit. Patient lives in a single story mobile home with his brother. Patient updated SW an Therapist, sports on medical condition.  Patient shared that he has been feeling as though he has stomach virus. No vomiting or diarrhea reported. Identifies stomach as having some pain/discomfort, no nausea. Patient shared that he has been having regular bowel movements but shares that he took some pepto bismol on yesterday and it addressed that stomach discomfort. No loss in appetite noted.    Patient shares that he sleeps well and naps during the day. No falls reported. Patient uses [facility] for ambulation. Patient has RW if needed.   RN took vitals and reviewed upcoming medical appointments. Patient recently missed an appointment for lab work due to no transportation. RN outreached cardio office to rescheduled lab work for same day as patients scheduled EKG. SW set transportation with [facility] [phone] for 06/22/21 for cardio appointment at 2:15pm. SW also set up transportation for scheduled f/u PCP appt with Dr. Caryl Bis for 06/28/21 $RemoveBefo'@3'IiyegGhwJxa$ :45pm.   Psychosocial assessment completed. No physical needs identified at this time. Patient continues need ongoing support with transportation. Patient also receives MOW's. Family also brings meals weekly. No other needs at this time.   Palliative care  will continue to monitor and assist with long term care planning as needed.   3.         PATIENT/CAREGIVER EDUCATION/ COPING:  Patient A&O x3, Pt able to answer all questions appropriately. Patient has normal motor skills. Patient has good reasoning ability. No S/S of depression or anxiety. Patients family is supportive. Brother resides with him and niece lives close by and checks on him often. Patient shares that he enjoyed playing the banjo and guitar.   4.         PERSONAL EMERGENCY PLAN:  Patient will call 9-1-1 for emergencies.   5.         COMMUNITY RESOURCES COORDINATION/ HEALTH CARE NAVIGATION:  Patient manages care.   6.         FINANCIAL/LEGAL CONCERNS/INTERVENTIONS:  None.     SOCIAL HX:  Social History   Tobacco Use   Smoking status: Never   Smokeless tobacco: Never  Substance Use Topics   Alcohol use: No    CODE STATUS: Full code ADVANCED DIRECTIVES: Y MOST FORM COMPLETE:  N HOSPICE EDUCATION PROVIDED: N  PPS: Patient is independent with all ADL's at this time Patient is able to cook small things. Patient is A&O x3 with average insight and judgement.     Time spent: 45 min   [place], [place]

## 2021-06-21 ENCOUNTER — Telehealth: Payer: Self-pay | Admitting: Primary Care

## 2021-06-21 NOTE — Telephone Encounter (Signed)
Message from [place] patient now wishes to have full code/intervention measures and asks for DNR to be shredded. Per message he requests DNR removed from [facility]. We will review MOST form on next visit.

## 2021-06-22 ENCOUNTER — Ambulatory Visit: Payer: Medicare HMO | Admitting: Nurse Practitioner

## 2021-06-22 NOTE — Progress Notes (Deleted)
Office Visit    Patient Name: Jordan Perkins Date of Encounter: 06/22/2021  Primary Care Provider:  [name], MD Primary Cardiologist:  Ida Rogue, MD  Chief Complaint    ***  Past Medical History    Past Medical History:  Diagnosis Date   (HFpEF) heart failure with preserved ejection fraction [facility])    CHF (congestive heart failure) ([place])    Coronary artery disease    Hyperlipidemia    Hypertension    PAH (pulmonary artery hypertension) ([place])    Permanent atrial fibrillation ([place])    Pleural effusion    Past Surgical History:  Procedure Laterality Date   CARDIAC CATHETERIZATION     CHOLECYSTECTOMY     COLONOSCOPY WITH PROPOFOL N/A 08/23/2020   Procedure: COLONOSCOPY WITH PROPOFOL;  Surgeon: [name], MD;  Location: [facility] ENDOSCOPY;  Service: Gastroenterology;  Laterality: N/A;   CORONARY ANGIOPLASTY  06/2004   s/p stent placement @ [facility]   CORONARY ARTERY BYPASS GRAFT  04-24-2004   CABG x 3 [facility]   ESOPHAGOGASTRODUODENOSCOPY (EGD) WITH PROPOFOL N/A 08/23/2020   Procedure: ESOPHAGOGASTRODUODENOSCOPY (EGD) WITH PROPOFOL;  Surgeon: [name], MD;  Location: [place];  Service: Gastroenterology;  Laterality: N/A;  Needs to be last case   RIGHT HEART CATH Right 06/09/2021   Procedure: RIGHT HEART CATH;  Surgeon: Nelva Bush, MD;  Location: [place] CV LAB;  Service: Cardiovascular;  Laterality: Right;    Allergies  Allergies  Allergen Reactions   Sulfa Antibiotics Rash    Rash/hives     History of Present Illness    ***  Home Medications    Current Outpatient Medications  Medication Sig Dispense Refill   atorvastatin (LIPITOR) 80 MG tablet TAKE 1 TABLET BY MOUTH EVERY DAY 90 tablet 1   ferrous sulfate 325 (65 FE) MG tablet TAKE 1 TABLET BY MOUTH EVERY DAY 90 tablet 1   pantoprazole (PROTONIX) 40 MG tablet Take 1 tablet (40 mg total) by mouth 2 (two) times daily. (Patient taking differently: Take 40 mg by  mouth daily.) 60 tablet 0   rivaroxaban (XARELTO) 20 MG TABS tablet Take 1 tablet (20 mg total) by mouth daily with supper. 30 tablet 0   torsemide (DEMADEX) 20 MG tablet Take 1 tablet (20 mg total) by mouth 2 (two) times daily. 60 tablet 5   No current facility-administered medications for this visit.     Review of Systems    ***.  All other systems reviewed and are otherwise negative except as noted above.  Physical Exam    VS:  There were no vitals taken for this visit. , BMI There is no height or weight on file to calculate BMI.     GEN: Well nourished, well developed, in no acute distress. HEENT: normal. Neck: Supple, no JVD, carotid bruits, or masses. Cardiac: RRR, no murmurs, rubs, or gallops. No clubbing, cyanosis, edema.  Radials/DP/PT 2+ and equal bilaterally.  Respiratory:  Respirations regular and unlabored, clear to auscultation bilaterally. GI: Soft, nontender, nondistended, BS + x 4. MS: no deformity or atrophy. Skin: warm and dry, no rash. Neuro:  Strength and sensation are intact. Psych: Normal affect.  Accessory Clinical Findings    ECG personally reviewed by me today - *** - no acute changes.  Lab Results  Component Value Date   WBC 5.8 05/30/2021   HGB 11.1 (L) 05/30/2021   HCT 31.3 (L) 05/30/2021   MCV 98 (H) 05/30/2021   PLT 133 (L) 05/30/2021  Lab Results  Component Value Date   CREATININE 1.09 05/30/2021   BUN 32 (H) 05/30/2021   NA 137 05/30/2021   K 4.0 05/30/2021   CL 99 05/30/2021   CO2 22 05/30/2021   Lab Results  Component Value Date   ALT 15 04/21/2021   AST 23 04/21/2021   ALKPHOS 101 04/21/2021   BILITOT 1.4 (H) 04/21/2021   Lab Results  Component Value Date   CHOL 113 04/21/2021   HDL 58.70 04/21/2021   LDLCALC 45 04/21/2021   TRIG 46.0 04/21/2021   CHOLHDL 2 04/21/2021    Lab Results  Component Value Date   HGBA1C 6.0 04/21/2021    Assessment & Plan    1.  ***   Murray Hodgkins, NP 06/22/2021, 7:30 AM

## 2021-06-22 NOTE — Telephone Encounter (Signed)
Note was previously forwarded to me as an [place]. Patient was scheduled to see Ignacia Bayley, NP today and BMP was to be drawn, however, the patient cancelled his appointment and has rescheduled to 06/27/21 with Dr. Rockey Situ.  Will forward to Dr. Donivan Scull nurse to follow up on the BMP at his clinic visit.  Also added to appt notes.

## 2021-06-27 ENCOUNTER — Ambulatory Visit (INDEPENDENT_AMBULATORY_CARE_PROVIDER_SITE_OTHER): Payer: Medicare HMO | Admitting: Cardiovascular Disease

## 2021-06-27 ENCOUNTER — Other Ambulatory Visit: Payer: Self-pay

## 2021-06-27 ENCOUNTER — Encounter: Payer: Self-pay | Admitting: Cardiovascular Disease

## 2021-06-27 VITALS — BP 138/68 | HR 61 | Ht 71.0 in | Wt 168.5 lb

## 2021-06-27 DIAGNOSIS — I10 Essential (primary) hypertension: Secondary | ICD-10-CM | POA: Diagnosis not present

## 2021-06-27 DIAGNOSIS — I5032 Chronic diastolic (congestive) heart failure: Secondary | ICD-10-CM

## 2021-06-27 DIAGNOSIS — I739 Peripheral vascular disease, unspecified: Secondary | ICD-10-CM | POA: Diagnosis not present

## 2021-06-27 DIAGNOSIS — Z79899 Other long term (current) drug therapy: Secondary | ICD-10-CM

## 2021-06-27 DIAGNOSIS — I272 Pulmonary hypertension, unspecified: Secondary | ICD-10-CM | POA: Diagnosis not present

## 2021-06-27 DIAGNOSIS — I779 Disorder of arteries and arterioles, unspecified: Secondary | ICD-10-CM

## 2021-06-27 DIAGNOSIS — E782 Mixed hyperlipidemia: Secondary | ICD-10-CM

## 2021-06-27 DIAGNOSIS — I4811 Longstanding persistent atrial fibrillation: Secondary | ICD-10-CM | POA: Diagnosis not present

## 2021-06-27 DIAGNOSIS — I25118 Atherosclerotic heart disease of native coronary artery with other forms of angina pectoris: Secondary | ICD-10-CM | POA: Diagnosis not present

## 2021-06-27 MED ORDER — SILDENAFIL CITRATE 20 MG PO TABS: 20.0000 mg | ORAL_TABLET | Freq: Three times a day (TID) | ORAL | 1 refills | Status: DC

## 2021-06-27 MED ORDER — TORSEMIDE 20 MG PO TABS: 20.0000 mg | ORAL_TABLET | Freq: Two times a day (BID) | ORAL | 6 refills | Status: DC

## 2021-06-27 NOTE — Patient Instructions (Addendum)
Medication Instructions:  Please START  sildenafil 20 mg  three times a day as needed Take script and coupon to [facility]  Please INCREASE torsemide up to 20 mg twice a day  If you need a refill on your cardiac medications before your next appointment, please call your pharmacy.    Lab work: BMP in 2 weeks  Testing/Procedures: No new testing needed  Follow-Up: At [facility], you and your health needs are our priority.  As part of our continuing mission to provide you with exceptional heart care, we have created designated Provider Care Teams.  These Care Teams include your primary Cardiologist (physician) and Advanced Practice Providers (APPs -  Physician Assistants and Nurse Practitioners) who all work together to provide you with the care you need, when you need it.  You will need a follow up appointment in 3 months  Providers on your designated Care Team:   Murray Hodgkins, NP Christell Faith, PA-C Marrianne Mood, PA-C Cadence [place], [place]  COVID-19 Vaccine Information can be found at: [email or web address].uk For questions related to vaccine distribution or appointments, please email vaccine@[email or web address] .[email or web address] or call 8476299769.

## 2021-06-27 NOTE — Progress Notes (Signed)
Date:  06/27/2021   ID:  [name], DOB 08/14/1941, MRN [id number]  Patient Location:  [place] [address]   Provider location:   [facility], [place] office  PCP:  [name], MD  Cardiologist:  [facility]  Chief Complaint  Patient presents with   2 week follow up     Patient c/o shortness of breath. Medications reviewed by the patient verbally.     History of Present Illness:    Jordan Perkins is a 80 y.o. male  past medical history of CAD,  bypass surgery, 2005 at [facility] stents in 2005,  hypertension,  hyperlipidemia,  Chronic atrial fibrillation , on xarelto hospital with shortness of breath and chest pain, rapid atrial fibrillation.  Echocardiogram showed ejection fraction 60-65% Carotid 07/2016: 40 to 59% stenosis on left, <39% on the right Pulmonary HTN, right heart pressures estimated 100 on echocardiogram Chronic anemia Who presents for follow up of his CAD and atrial fibrillation, pulmonary HTN (estimated right heart pressures on echo of 100 mmHg on recent echo May 2022 and echo July 2021)  Recently seen in clinic May 30, 2021 Concern at that time for severely elevated right heart pressures on echocardiogram He denies significant symptoms, walks with a cane/walker, sedentary lifestyle, spends most of his time in the house as he does not drive, relies on friends and neighbors to drive him to appointments  RHC performed June 09, 2021 Severe pulmonary hypertension (mean PAP 63 mmHg, PVR 5.9 WU). Moderately elevated left and right heart filling pressures (RAP 15 mmHg, PCWP 30 mmHg). Normal Fick cardiac output/index.   Recommendation made to increase torsemide to 20 mg BID with follow-up BMP in 1 week.  In follow-up today, presents in a wheelchair reports he is taking torsemide 20 daily When pushed, reports having some occasional shortness of breath on exertion but not very  symptomatic Denies abdominal fullness, no leg swelling, no PND orthopnea  On discussion of his pulmonary hypertension and options for treatment, Reports he does not want to go to [facility], unable to drive Lives with his brother  Reports walking limited by arthritides  EKG personally reviewed by myself on todays visit Atrial fibrillation rate 61 bpm no significant ST-T wave changes  Other past medical history reviewed In the hospital 04/2021, weak, sodium low, 120 Given IVF, sodium improved by increasing his oral salt intake Was discharged back on his torsemide Now sodium 139  History of chronic anemia, Last hemoglobin 9.9 , chronic  Other lab work reviewed Creatinine 1.08, BUN 27  Echo 01/2021  Right heart pressures estimated 100 mmHg on echo  1. Left ventricular ejection fraction, by estimation, is 55 to 60%. The  left ventricle has normal function. The left ventricle has no regional  wall motion abnormalities. Left ventricular diastolic parameters are  consistent with Grade II diastolic  dysfunction (pseudonormalization).   2. Right ventricular systolic function is mildly reduced. The right  ventricular size is moderately enlarged.   3. Left atrial size was severely dilated.   4. Right atrial size was moderately dilated.   5. The mitral valve is degenerative. Mild mitral valve regurgitation.   6. The aortic valve is tricuspid. Aortic valve regurgitation is not  visualized. Mild aortic valve sclerosis is present, with no evidence of  aortic valve stenosis.   7. The inferior vena cava is dilated in size with <50% respiratory  variability, suggesting right atrial pressure of 15 mmHg.  Echocardiogram July 2021 ejection fraction 60 to 65% Severely elevated right heart pressures  [place]io: Atrial Fibrillation occurred continuously (100% burden), ranging from 32-104 bpm (avg of 56 bpm). 2 Ventricular Tachycardia runs occurred, the run with the fastest interval lasting 7 beats  with a max rate of 171 bpm (avg 117 bpm);  the run with the fastest interval was also the longest.  Isolated VEs were occasional (2.6%, 28107), VE Couplets were rare (<1.0%, 393), and VE Triplets were rare (<1.0%, 14). Ventricular Bigeminy and Trigeminy were present.  Hospital admission 10/2016 Hyponatremia, weakness, elevated T.Bili (gilberts)   hospital admission in 2014, noted to have gallstones.   negative Hida scan. Now s/p  Cholecystectomy 11/14   CT scan in the hospital of his abdomen and pelvis showed acute pancreatitis with increased fluid density in the peri-pancreatic fat, multiple gallstones, small hiatal hernia   Past Medical History:  Diagnosis Date   (HFpEF) heart failure with preserved ejection fraction (HCC)    CHF (congestive heart failure) (HCC)    Coronary artery disease    Hyperlipidemia    Hypertension    PAH (pulmonary artery hypertension) (HCC)    Permanent atrial fibrillation (HCC)    Pleural effusion    Past Surgical History:  Procedure Laterality Date   CARDIAC CATHETERIZATION     CHOLECYSTECTOMY     COLONOSCOPY WITH PROPOFOL N/A 08/23/2020   Procedure: COLONOSCOPY WITH PROPOFOL;  Surgeon: [name], MD;  Location: [facility] ENDOS[facility];  Service: Gastroenterology;  Laterality: N/A;   CORONARY ANGIOPLASTY  06/2004   s/p stent placement @ [facility]   CORONARY ARTERY BYPASS GRAFT  04-24-2004   CABG x 3 [facility]   ESOPHAGOGASTRODUODENOSCOPY (EGD) WITH PROPOFOL N/A 08/23/2020   Procedure: ESOPHAGOGASTRODUODENOSCOPY (EGD) WITH PROPOFOL;  Surgeon: [name], MD;  Location: [place];  Service: Gastroenterology;  Laterality: N/A;  Needs to be last case   RIGHT HEART CATH Right 06/09/2021   Procedure: RIGHT HEART CATH;  Surgeon: Nelva Bush, MD;  Location: [place] CV LAB;  Service: Cardiovascular;  Laterality: Right;     Current Meds  Medication Sig   atorvastatin (LIPITOR) 80 MG tablet TAKE 1 TABLET BY MOUTH EVERY DAY   ferrous sulfate 325  (65 FE) MG tablet TAKE 1 TABLET BY MOUTH EVERY DAY   pantoprazole (PROTONIX) 40 MG tablet Take 40 mg by mouth daily.   rivaroxaban (XARELTO) 20 MG TABS tablet Take 1 tablet (20 mg total) by mouth daily with supper.   torsemide (DEMADEX) 20 MG tablet Take 20 mg by mouth daily.     Allergies:   Sulfa antibiotics   Social History   Tobacco Use   Smoking status: Never   Smokeless tobacco: Never  Vaping Use   Vaping Use: Never used  Substance Use Topics   Alcohol use: No   Drug use: No      Family Hx: The patient's family history includes Heart attack in his brother; Heart disease in his father and paternal uncle.  ROS:   Review of Systems  Constitutional: Negative.   HENT: Negative.    Respiratory: Negative.    Cardiovascular: Negative.   Gastrointestinal: Negative.   Musculoskeletal: Negative.   Neurological: Negative.   Psychiatric/Behavioral: Negative.    All other systems reviewed and are negative.   Labs/Other Tests and Data Reviewed:    Recent Labs: 04/21/2021: ALT 15 04/23/2021: Magnesium 1.7 05/30/2021: BUN 32; Creatinine, Ser 1.09; Hemoglobin 11.1; Platelets 133; Potassium 4.0; Sodium 137   Recent Lipid Panel Lab  Results  Component Value Date/Time   CHOL 113 04/21/2021 01:33 PM   CHOL 89 (L) 04/06/2015 04:42 PM   CHOL 150 06/06/2013 09:47 AM   TRIG 46.0 04/21/2021 01:33 PM   TRIG 85 06/06/2013 09:47 AM   HDL 58.70 04/21/2021 01:33 PM   HDL 40 04/06/2015 04:42 PM   HDL 43 06/06/2013 09:47 AM   CHOLHDL 2 04/21/2021 01:33 PM   LDLCALC 45 04/21/2021 01:33 PM   LDLCALC 36 04/06/2015 04:42 PM   LDLCALC 90 06/06/2013 09:47 AM    Wt Readings from Last 3 Encounters:  06/27/21 168 lb 8 oz (76.4 kg)  06/19/21 166 lb (75.3 kg)  05/30/21 165 lb 2 oz (74.9 kg)     Exam:    BP 138/68 (BP Location: Left Arm, Patient Position: Sitting, Cuff Size: Normal)   Pulse 61   Ht 5\' 11"  (1.803 m)   Wt 168 lb 8 oz (76.4 kg)   SpO2 98%   BMI 23.50 kg/m   Constitutional:  oriented to person, place, and time. No distress.  HENT:  Head: Grossly normal Eyes:  no discharge. No scleral icterus.  Neck:  JVD 8-10+, no carotid bruits  Cardiovascular: Regular rate and rhythm, no murmurs appreciated Pulmonary/Chest: Clear to auscultation bilaterally, no wheezes or rails Abdominal: Soft.  no distension.  no tenderness.  Musculoskeletal: Normal range of motion Neurological:  normal muscle tone. Coordination normal. No atrophy Skin: Skin warm and dry Psychiatric: normal affect, pleasant  ASSESSMENT & PLAN:    Chronic diastolic CHF/pulmonary hypertension Severely elevated right heart pressures documented on right heart catheterization and echocardiogram dating back several years -Does not want to go to [facility], has difficulty getting to even his local appointments -Reports being asymptomatic, as detailed above -Recommend he increase torsemide up to 20 twice daily, we will add sildenafil 20 mg 3 times daily with close follow-up -Etiology of his pulmonary hypertension unclear, if not diagnosed thromboembolic disease, he is on Xarelto.  Does not have severe underlying lung disease -Follow-up BMP 2 weeks  Atrial fibrillation, unspecified type ([place]) Permanent atrial fibrillation On anticoagulation,  Rate controlled, not on beta-blocker  Coronary artery disease of native artery of native heart with stable angina pectoris (HCC) Currently with no symptoms of angina. No further workup at this time. Continue current medication regimen.  PAD (peripheral artery disease) (HCC) Denies claudication symptoms Significant leg weakness, does not ambulate very far  Bilateral carotid artery disease, unspecified type ([place]) Cholesterol at goal Mild disease on left  S/P CABG (coronary artery bypass graft) Denies anginal symptoms  Essential hypertension Blood pressure is well controlled on today's visit. No changes made to the medications.    Total  encounter time more than 25 minutes  Greater than 50% was spent in counseling and coordination of care with the patient   Signed, Ida Rogue, MD  06/27/2021 3:28 PM    [place] [facility] [address], [place], [place] [address]

## 2021-06-28 ENCOUNTER — Ambulatory Visit: Payer: Medicare HMO | Admitting: Family Medicine

## 2021-07-06 ENCOUNTER — Telehealth: Payer: Self-pay

## 2021-07-06 NOTE — Telephone Encounter (Signed)
07/06/21 @1PM : Palliative care SW outreached patient to schedule in person home visit.   Home visit scheduled with C SW and RN for 11/3 @12 .  Patient shared that he also has an appointment for lab draw on 11/1. SW called to arrange transportation with [facility] for patient.

## 2021-07-11 ENCOUNTER — Other Ambulatory Visit: Payer: Self-pay | Admitting: Family

## 2021-07-11 ENCOUNTER — Other Ambulatory Visit: Payer: Self-pay

## 2021-07-11 ENCOUNTER — Other Ambulatory Visit (INDEPENDENT_AMBULATORY_CARE_PROVIDER_SITE_OTHER): Payer: Medicare HMO

## 2021-07-11 DIAGNOSIS — Z79899 Other long term (current) drug therapy: Secondary | ICD-10-CM

## 2021-07-11 NOTE — Telephone Encounter (Signed)
Rx(s) sent to pharmacy electronically.  

## 2021-07-12 LAB — ANA W/RFX IF POS TO SJOSSA/B: Anti Nuclear Antibody (ANA): NEGATIVE

## 2021-07-12 LAB — BASIC METABOLIC PANEL
BUN/Creatinine Ratio: 25 — ABNORMAL HIGH (ref 10–24)
BUN: 31 mg/dL — ABNORMAL HIGH (ref 8–27)
CO2: 24 mmol/L (ref 20–29)
Calcium: 9.2 mg/dL (ref 8.6–10.2)
Chloride: 92 mmol/L — ABNORMAL LOW (ref 96–106)
Creatinine, Ser: 1.24 mg/dL (ref 0.76–1.27)
Glucose: 104 mg/dL — ABNORMAL HIGH (ref 70–99)
Potassium: 4.1 mmol/L (ref 3.5–5.2)
Sodium: 131 mmol/L — ABNORMAL LOW (ref 134–144)
eGFR: 60 mL/min/{1.73_m2} (ref >59)

## 2021-07-13 ENCOUNTER — Other Ambulatory Visit: Payer: Self-pay

## 2021-07-13 ENCOUNTER — Other Ambulatory Visit: Payer: Medicare HMO

## 2021-07-13 VITALS — BP 120/68 | HR 89 | Temp 99.5°F | Resp 18 | Wt 166.0 lb

## 2021-07-13 DIAGNOSIS — Z515 Encounter for palliative care: Secondary | ICD-10-CM

## 2021-07-13 NOTE — Progress Notes (Signed)
COMMUNITY PALLIATIVE CARE SW NOTE  PATIENT NAME: Jordan Perkins DOB: 08-Feb-1941 MRN: [id number]  PRIMARY CARE PROVIDER: [name], MD  RESPONSIBLE PARTY:  Acct ID - Guarantor Home Phone Work Phone Relationship Acct Type  [id number] Jordan, [name] [phone]  Self P/F     [address], [place], [place] [address]     PLAN OF CARE and INTERVENTIONS:             GOALS OF CARE/ ADVANCE CARE PLANNING:  Patient is now a FULL CODE. Ongoing discussion about MOST form. Patient's goal is to remain at home with brother.   2.         SOCIAL/EMOTIONAL/SPIRITUAL ASSESSMENT/ INTERVENTIONS:  SW and RN Jordan Perkins met with patient in patients home for follow up visit. Patient lives in a single story mobile home with his brother. Patient updated SW an Jordan Perkins on medical condition.   Patient sitting in recliner, playing banjo during visit. Patient updated PC team on medical changes. Patient recently saw cardiology, Dr. Rockey Perkins on 10/18, recommended to increase torsemide up to 20 twice daily, we will add sildenafil 20 mg 3 times daily, patient has these medications in the home. Cardiology also recommended BMP lab work, of which patient had done on 11/1, to be reviewed by cardiologist.    Patient shares that he sleeps well and naps during the day. No falls reported. Patient uses [facility] for ambulation. Patient has RW if needed. Appetite remains good, current weight is 166lbs. Patients weighs self daily.   RN took vitals and reviewed medications. No needs at this time.   Psychosocial assessment completed. No physical needs identified at this time. Patient continues need ongoing support with transportation. Patient also receives MOW's. Family also brings meals weekly. Patient uses wood fire to heat his home, and share that he will have plenty of wood to heat his home this winter. No other needs at this time.   Palliative care will continue to monitor and assist with long term care planning as needed.   3.          PATIENT/CAREGIVER EDUCATION/ COPING:  Patient A&O x3, Pt able to answer all questions appropriately. Patient has normal motor skills. Patient has good reasoning ability. No S/S of depression or anxiety. Patients family is supportive. Brother resides with him and niece lives close by and checks on him often. Patient shares that he enjoyed playing the banjo and guitar.   4.         PERSONAL EMERGENCY PLAN:  Patient will call 9-1-1 for emergencies.   5.         COMMUNITY RESOURCES COORDINATION/ HEALTH CARE NAVIGATION:  Patient manages care.   6.         FINANCIAL/LEGAL CONCERNS/INTERVENTIONS:  None.     SOCIAL HX:  Social History   Tobacco Use   Smoking status: Never   Smokeless tobacco: Never  Substance Use Topics   Alcohol use: No    CODE STATUS: Full Code  ADVANCED DIRECTIVES: Y MOST FORM COMPLETE:  continue to be discussed HOSPICE EDUCATION PROVIDED: N  AUQ:[id number] is independent with all ADL's at this time Patient is ale to cook small things. Patient is A&O x3 with average insight and judgement.    Time spent: 40 min    Jordan Perkins, [place]

## 2021-07-13 NOTE — Progress Notes (Signed)
PATIENT NAME: Jordan Perkins DOB: March 24, 1941 MRN: [id number]  PRIMARY CARE PROVIDER: [name], MD  RESPONSIBLE PARTY:  Acct ID - Guarantor Home Phone Work Phone Relationship Acct Type  [id number] IYAD, [phone]  Self P/F     [address], [place], [place] [address]    PLAN OF CARE and INTERVENTIONS:               1.  GOALS OF CARE/ ADVANCE CARE PLANNING:  Discussed MOST form with patient.  Will schedule a visit with Palliative Care NP to complete.                2.  PATIENT/CAREGIVER EDUCATION:  MOST               4. PERSONAL EMERGENCY PLAN:  Activate 911 for emergencies.                5.  DISEASE STATUS:  Joint visit completed with [place], SW.  Patient found in the living room playing his banjo.  Patient continues to use a cane or walker in the home.  No falls are reported.    Appetite remains good.  Receiving MOW and continues to do light meal prep.  Nieces are bringing meals during the weekend.  Weight is maintaining between 165-167 lbs.   Denies issues with shortness of breath, chest pain, headaches and nausea or vomiting.  No issues with insomnia.  No napping during the daytime.  Seen yesterday for BMP to be drawn.  Patient has not heard about results at this time.  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  Reviewed medications with patient and no refills are needed at this time.   HISTORY OF PRESENT ILLNESS:  80 year old male with a history of CHF, CAD, and HTN.  Patient is being followed by Palliative Care monthly and PRN.  CODE STATUS: Full ADVANCED DIRECTIVES: Yes MOST FORM: No PPS: 50%   PHYSICAL EXAM:   VITALS: Today's Vitals   07/13/21 1216  BP: 120/68  Pulse: 89  Resp: 18  Temp: 99.5 F (37.5 C)  SpO2: 99%  Weight: 166 lb (75.3 kg)    LUNGS: clear to auscultation  CARDIAC: Cor irreg, irreg RRR}  EXTREMITIES: trace edema SKIN: Skin color, texture, turgor normal. No rashes or lesions or  mobility and turgor normal  NEURO: positive for gait problems       Lorenza Burton, RN

## 2021-07-17 ENCOUNTER — Telehealth: Payer: Self-pay

## 2021-07-17 NOTE — Telephone Encounter (Signed)
Able to reach pt regarding his recent lab work, Dr. Rockey Situ had a chance to review their results and advised   "Stable lab work  Need repeat BMP 3 months to watch sodium "  All questions  were address and no additional concerns at this time. Agreeable to plan, will call back for anything further.  Appt in Jan, will have labs at that time with Dr. Rockey Situ.

## 2021-08-12 ENCOUNTER — Other Ambulatory Visit: Payer: Self-pay | Admitting: Family

## 2021-08-15 ENCOUNTER — Other Ambulatory Visit: Payer: Medicare HMO

## 2021-08-15 ENCOUNTER — Other Ambulatory Visit: Payer: Self-pay

## 2021-08-15 ENCOUNTER — Other Ambulatory Visit: Payer: Medicare HMO | Admitting: Primary Care

## 2021-08-15 VITALS — BP 130/50 | HR 74 | Temp 99.7°F | Resp 18 | Ht 71.0 in | Wt 167.0 lb

## 2021-08-15 DIAGNOSIS — I5032 Chronic diastolic (congestive) heart failure: Secondary | ICD-10-CM | POA: Diagnosis not present

## 2021-08-15 DIAGNOSIS — Z515 Encounter for palliative care: Secondary | ICD-10-CM

## 2021-08-15 DIAGNOSIS — I739 Peripheral vascular disease, unspecified: Secondary | ICD-10-CM | POA: Diagnosis not present

## 2021-08-15 DIAGNOSIS — R413 Other amnesia: Secondary | ICD-10-CM

## 2021-08-15 NOTE — Progress Notes (Signed)
COMMUNITY PALLIATIVE CARE SW NOTE  PATIENT NAME: Jordan Perkins DOB: 12-Jul-1941 MRN: [id number]  PRIMARY CARE PROVIDER: [name], MD  RESPONSIBLE PARTY:  Acct ID - Guarantor Home Phone Work Phone Relationship Acct Type  [id number] ELSWORTH, [id number]  Self P/F     [place], [place], [place] [address]     PLAN OF CARE and INTERVENTIONS:             GOALS OF CARE/ ADVANCE CARE PLANNING:  Patient is now a FULL CODE w/ full scope of treatments. MOST completed. Marland Kitchen MOST form was discussed, patient expressed interest in completing a MOST form. Discussed and reviewed sections of the form in detail, opportunity for questions given, all questions answered. Form completed and signed with patient, signed form left with patient to keep, copy of MOST form uploaded to [facility] EMR. Patients niece, [name], holds [place]. Patient's goal is to remain at home with brother.   2.         SOCIAL/EMOTIONAL/SPIRITUAL ASSESSMENT/ INTERVENTIONS:  SW and RN Almyra Free met with patient in patients home for follow up visit. Patient lives in a single story mobile home with his brother. Patient updated SW an Therapist, sports on medical condition. PC NP, Zannie Cove connected virtually for telemedicine visit as well to address ACP and other needs.   Patient sitting in recliner. Patient updated PC team on medical changes. No changes, patient doing well. Patient share that he has pain in feet, and believes it is due to arthritis.  Patient share he has not has a bowel movement EOD. NP advised patient to add tylenol and senna to medication regimen to assist with pain and constipation.    Patient shares that he sleeps well and naps during the day. No falls reported. Patient uses [facility] for ambulation. Patient has RW if needed. Appetite remains good, current weight is 167lbs. Patients weighs self daily.   RN took vitals and reviewed medications. No needs at this time.   Psychosocial assessment completed. No physical needs  identified at this time. Patient continues need ongoing support with transportation. Patient also receives MOW's. Family also brings meals weekly. Patient uses wood fire to heat his home, and share that he will have plenty of wood to heat his home this winter. No other needs at this time.   Palliative care will continue to monitor and assist with long term care planning as needed.   3.         PATIENT/CAREGIVER EDUCATION/ COPING:  Patient A&O x3, Pt able to answer all questions appropriately. Patient has normal motor skills. Patient has good reasoning ability. No S/S of depression or anxiety. Patients family is supportive. Brother resides with him and niece lives close by and checks on him often. Patient shares that he enjoyed playing the banjo and guitar.   4.         PERSONAL EMERGENCY PLAN:  Patient will call 9-1-1 for emergencies.   5.         COMMUNITY RESOURCES COORDINATION/ HEALTH CARE NAVIGATION:  Patient manages care.   6.         FINANCIAL/LEGAL CONCERNS/INTERVENTIONS:  None.     SOCIAL HX:  Social History   Tobacco Use   Smoking status: Never   Smokeless tobacco: Never  Substance Use Topics   Alcohol use: No    CODE STATUS: FULL Code  ADVANCED DIRECTIVES: Y MOST FORM COMPLETE:  Y HOSPICE EDUCATION PROVIDED: N  PPS: Patient is independent with ADL's.  Patient is able to cook meals. Patient does not drive. Patient is A&O  x3 with normal insight and judgement.   Time spent: 45 min    [place], [place]

## 2021-08-15 NOTE — Progress Notes (Signed)
[place] Consult Note Telephone: [phone]  Fax: [phone]    Date of encounter: 08/15/21 11:07 AM PATIENT NAME: Jordan Perkins   [phone] (home)  DOB: 1941/03/26 MRN: [id number] PRIMARY CARE PROVIDER:    [name], MD,  [address] [place] [address] [phone]  REFERRING PROVIDER:   [name], MD [address] [place],  [place] [address] [phone]  RESPONSIBLE PARTY:    Contact Information     Name Relation Home Work Mobile   Stout,Teresa Niece   [phone]   [facility] Niece   [phone]   sawyer,edward Neighbor   [phone]   DARRIS, STAIGER Brother [phone]        Due to the COVID-19 crisis, this visit was done via telemedicine from my office and it was initiated and consent by this patient and or family.  I connected with  Jordan Perkins OR PROXY on 08/15/21 by a video enabled telemedicine application and verified that I am speaking with the correct person using two identifiers.   I discussed the limitations of evaluation and management by telemedicine. The patient expressed understanding and agreed to proceed.   I met face to face with patient and family in home connecting virtually with Jordan Gather, RN. Palliative Care was asked to follow this patient by consultation request of  Jordan Haven, MD to address advance care planning and complex medical decision making. This is a follow up visit.                                   ASSESSMENT AND PLAN / RECOMMENDATIONS:   Advance Care Planning/Goals of Care: Goals include to maximize quality of life and symptom management. Our advance care planning conversation included a discussion about:    The value and importance of advance care planning-MOST form completed on today's visit Exploration of personal, cultural or spiritual beliefs that might  influence medical decisions  Exploration of goals of care in the event of a sudden injury or illness  Identification of a healthcare agent-already established. Review of an  advance directive document. Patinet outlined he wanted full scope of services to include CPR and intubation, ICU, antibiotic use and full nutrition and hydration interventions. CODE STATUS:Full  I completed a MOST form today. The patient and family outlined their wishes for the following treatment decisions:  Cardiopulmonary Resuscitation: Attempt Resuscitation (CPR)  Medical Interventions: Full Scope of Treatment: Use intubation, advanced airway interventions, mechanical ventilation, cardioversion as indicated, medical treatment, IV fluids, etc, also provide comfort measures. Transfer to the hospital if indicated  Antibiotics: Antibiotics if indicated  IV Fluids: IV fluids if indicated  Feeding Tube: Feeding tube long-term if indicated    I spent 20 minutes providing this consultation. More than 50% of the time in this consultation was spent in counseling and care coordination.  -----------------------------------------------------------------------------------------------------------------------------------------------------------------------------------------------------  Symptom Management/Plan:  CHF:  Continues to weight himself daily.  Weights are stable ranging from 166-167 lbs over the last month. Currently on Demadex 20 mg daily.  Denies shortness of breath with exertion but tires easily when doing household chores.  Discussed importance of continuing with daily weights and notifying Cardiology of a weight gain greater than 3 lbs in a 24 hour period or 5 lbs in a week.  Has had hyponatremia over past months, being monitored by team,  most recent on record is 131. Has been in 120's.Continue to monitor for weakness, per lab orders.  Constipation:  Patient endorse constipation occurring daily. Last bowel movement this  am.  He is taking prune juice that sometimes helps.  Ordered Senna 1 tab po daily to aid in constipation.  Nutrition: Appears stable, weights are stable. Albumin 3.6 in 8/22, most recent on record. Body mass index is 23.29 kg/m.  Pain:  Patient endorse "arthritic pain" to bilateral feet.  Occasionally shooting pain will occur up the legs.  Patient is taking Tylenol as needed.  Ordered Tylenol Arthritis 650 mg po tid to address pain management.  If this does not help we can consider gabapentin 100 mg po at hs on next visit.   Follow up Palliative Care Visit: Palliative care will continue to follow for complex medical decision making, advance care planning, and clarification of goals. Return 8 weeks or prn.  This visit was coded based on medical decision making (MDM).  PPS: 50%  HOSPICE ELIGIBILITY/DIAGNOSIS: TBD  Chief Complaint: Constipation, chronic pain.  HISTORY OF PRESENT ILLNESS:  Jordan Perkins is a 80 y.o. year old male  with multiple medical problems including HFpEF, PAH, PAD, permanent afib, debility. Palliative Care was asked to follow this patient by consultation request of Jordan Haven, MD to help address advance care planning and goals of care. This is a follow up visit. HE endorses mild constipation  in context of chronic illness, and diuretic use, which he reports as difficult bm qod. HE is currently using prune juice. He has not tried other laxatives. I have ordered senna 1 tab daily to address.  History obtained from review of EMR, discussion with primary team, and interview with family, facility staff/caregiver and/or Jordan Perkins.  I reviewed available labs, medications, imaging, studies and related documents from the EMR.  Records reviewed and summarized above.   ROS  General: NAD ENMT: denies dysphagia Cardiovascular: denies chest pain, denies DOE Pulmonary: denies cough, denies increased SOB Abdomen: endorses good appetite, + occasional constipation, endorses  continence of bowel GU: denies dysuria, endorses continence of urine MSK:  + weakness,  no falls reported Skin: denies rashes or wounds Neurological: denies pain, denies insomnia Psych: Endorses positive mood Heme/lymph/immuno: denies bruises, abnormal bleeding  Physical Exam: Current and past weights: 07/2021 166 lbs and today 167 lbs. Constitutional: NAD General: WNWD EYES: anicteric sclera, lids intact, no discharge  ENMT: intact hearing, oral mucous membranes moist, dentition intact CV: S1S2, RRR, no LE edema Pulmonary: LCTA, no increased work of breathing, no cough, room air Abdomen: intake 75-100%, normo-active BS + 4 quadrants, soft and non tender, no ascites GU: deferred MSK: mod sarcopenia, moves all extremities, ambulatory with cane or walker Skin: warm and dry, no rashes or wounds on visible skin Neuro:  + weakness mostly with household chores,  no cognitive impairment Psych: non-anxious affect, A and O x 3 Hem/lymph/immuno: no widespread bruising  Thank you for the opportunity to participate in the care of Jordan Perkins.  The palliative care team will continue to follow. Please call our office at [phone] if we can be of additional assistance.   Lorenza Burton, RN  Cyndia Skeeters DNP, AGPCNP-BC, [facility] ([facility])  COVID-19 PATIENT SCREENING TOOL Asked and negative response unless otherwise noted:   Have you had symptoms of covid, tested positive or been in contact with someone with symptoms/positive test in the past 5-10 days?  No

## 2021-08-29 ENCOUNTER — Other Ambulatory Visit: Payer: Self-pay | Admitting: Family Medicine

## 2021-09-24 NOTE — Progress Notes (Signed)
Date:  09/25/2021   ID:  [name], DOB [birth year], MRN [id number]  Patient Location:  [place] [address]   Provider location:   [facility], [place] office  PCP:  [name], MD  Cardiologist:  Arvid Right [facility]  Chief Complaint  Patient presents with   3 month follow up     Patient c/o fatigue and shortness of breath at times. Medications reviewed by the patient verbally.     History of Present Illness:    Jordan Perkins is a 81 y.o. male  past medical history of CAD,  bypass surgery, 2005 at [facility] stents in 2005,  hypertension,  hyperlipidemia,  Chronic atrial fibrillation , on xarelto hospital with shortness of breath and chest pain, rapid atrial fibrillation.  Echocardiogram showed ejection fraction 60-65% Carotid 07/2016: 40 to 59% stenosis on left, <39% on the right Pulmonary HTN, right heart pressures estimated 100 on echocardiogram Chronic anemia Who presents for follow up of his CAD and atrial fibrillation, pulmonary HTN (estimated right heart pressures on echo of 100 mmHg on recent echo May 2022 and echo July 2021)  Recently seen in clinic oct 2022 Presenting for follow-up of his severely elevated right heart pressures on echocardiogram  Limited, walk short distances, uses a cane Does not drive, relies on friends to get him to appointments Sedentary lifestyle Presents today in a wheelchair  Prior procedure data reviewed RHC performed June 09, 2021 Severe pulmonary hypertension (mean PAP 63 mmHg, PVR 5.9 WU). Moderately elevated left and right heart filling pressures (RAP 15 mmHg, PCWP 30 mmHg). Normal Fick cardiac output/index.  In follow-up today he is unsure if he is taking torsemide once a day or twice a day Denies significant leg swelling, no shortness of breath Thinks he is on sildenafil, he is unsure, possibly twice a day Previously declined referral to pulmonary hypertension clinic  in [place], unable to drive Lives with his brother  Reports walking limited by arthritides  EKG personally reviewed by myself on todays visit Atrial fibrillation rate 70 bpm no significant ST-T wave changes  Other past medical history reviewed In the hospital 04/2021, weak, sodium low, 120 Given IVF, sodium improved by increasing his oral salt intake Was discharged back on his torsemide Now sodium 139  History of chronic anemia, Last hemoglobin 9.9 , chronic  Other lab work reviewed Creatinine 1.08, BUN 27  Echo 01/2021  Right heart pressures estimated 100 mmHg on echo  1. Left ventricular ejection fraction, by estimation, is 55 to 60%. The  left ventricle has normal function. The left ventricle has no regional  wall motion abnormalities. Left ventricular diastolic parameters are  consistent with Grade II diastolic  dysfunction (pseudonormalization).   2. Right ventricular systolic function is mildly reduced. The right  ventricular size is moderately enlarged.   3. Left atrial size was severely dilated.   4. Right atrial size was moderately dilated.   5. The mitral valve is degenerative. Mild mitral valve regurgitation.   6. The aortic valve is tricuspid. Aortic valve regurgitation is not  visualized. Mild aortic valve sclerosis is present, with no evidence of  aortic valve stenosis.   7. The inferior vena cava is dilated in size with <50% respiratory  variability, suggesting right atrial pressure of 15 mmHg.    Echocardiogram July 2021 ejection fraction 60 to 65% Severely elevated right heart pressures  Zio: Atrial Fibrillation occurred continuously (100% burden), ranging from 32-104 bpm (  avg of 56 bpm). 2 Ventricular Tachycardia runs occurred, the run with the fastest interval lasting 7 beats with a max rate of 171 bpm (avg 117 bpm);  the run with the fastest interval was also the longest.  Isolated VEs were occasional (2.6%, 28107), VE Couplets were rare (<1.0%,  393), and VE Triplets were rare (<1.0%, 14). Ventricular Bigeminy and Trigeminy were present.  Hospital admission 10/2016 Hyponatremia, weakness, elevated T.Bili (gilberts)   hospital admission in 2014, noted to have gallstones.   negative Hida scan. Now s/p  Cholecystectomy 11/14   CT scan in the hospital of his abdomen and pelvis showed acute pancreatitis with increased fluid density in the peri-pancreatic fat, multiple gallstones, small hiatal hernia   Past Medical History:  Diagnosis Date   (HFpEF) heart failure with preserved ejection fraction (HCC)    CHF (congestive heart failure) (HCC)    Coronary artery disease    Hyperlipidemia    Hypertension    PAH (pulmonary artery hypertension) (HCC)    Permanent atrial fibrillation (HCC)    Pleural effusion    Past Surgical History:  Procedure Laterality Date   CARDIAC CATHETERIZATION     CHOLECYSTECTOMY     COLONOSCOPY WITH PROPOFOL N/A 08/23/2020   Procedure: COLONOSCOPY WITH PROPOFOL;  Surgeon: [name], MD;  Location: [facility] ENDOSCOPY;  Service: Gastroenterology;  Laterality: N/A;   CORONARY ANGIOPLASTY  06/2004   s/p stent placement @ [facility]   CORONARY ARTERY BYPASS GRAFT  04-24-2004   CABG x 3 [facility]   ESOPHAGOGASTRODUODENOSCOPY (EGD) WITH PROPOFOL N/A 08/23/2020   Procedure: ESOPHAGOGASTRODUODENOSCOPY (EGD) WITH PROPOFOL;  Surgeon: [name], MD;  Location: [place];  Service: Gastroenterology;  Laterality: N/A;  Needs to be last case   RIGHT HEART CATH Right 06/09/2021   Procedure: RIGHT HEART CATH;  Surgeon: Nelva Bush, MD;  Location: [place] [facility];  Service: Cardiovascular;  Laterality: Right;     Current Meds  Medication Sig   atorvastatin (LIPITOR) 80 MG tablet TAKE 1 TABLET BY MOUTH EVERY DAY   ferrous sulfate 325 (65 FE) MG tablet TAKE 1 TABLET BY MOUTH EVERY DAY   pantoprazole (PROTONIX) 40 MG tablet TAKE 1 TABLET BY MOUTH EVERY DAY   rivaroxaban (XARELTO) 20 MG TABS tablet Take 1  tablet (20 mg total) by mouth daily with supper.   sildenafil (REVATIO) 20 MG tablet Take 1 tablet (20 mg total) by mouth 3 (three) times daily.   [DISCONTINUED] torsemide (DEMADEX) 20 MG tablet TAKE 1 TABLET BY MOUTH EVERY DAY     Allergies:   Sulfa antibiotics   Social History   Tobacco Use   Smoking status: Never   Smokeless tobacco: Never  Vaping Use   Vaping Use: Never used  Substance Use Topics   Alcohol use: No   Drug use: No      Family Hx: The patient's family history includes Heart attack in his brother; Heart disease in his father and paternal uncle.  ROS:   Review of Systems  Constitutional: Negative.   HENT: Negative.    Respiratory: Negative.    Cardiovascular: Negative.   Gastrointestinal: Negative.   Musculoskeletal: Negative.   Neurological: Negative.   Psychiatric/Behavioral: Negative.    All other systems reviewed and are negative.   Labs/Other Tests and Data Reviewed:    Recent Labs: 04/21/2021: ALT 15 04/23/2021: Magnesium 1.7 05/30/2021: Hemoglobin 11.1; Platelets 133 07/11/2021: BUN 31; Creatinine, Ser 1.24; Potassium 4.1; Sodium 131   Recent Lipid Panel Lab Results  Component Value Date/Time   CHOL 113 04/21/2021 01:33 PM   CHOL 89 (L) 04/06/2015 04:42 PM   CHOL 150 06/06/2013 09:47 AM   TRIG 46.0 04/21/2021 01:33 PM   TRIG 85 06/06/2013 09:47 AM   HDL 58.70 04/21/2021 01:33 PM   HDL 40 04/06/2015 04:42 PM   HDL 43 06/06/2013 09:47 AM   CHOLHDL 2 04/21/2021 01:33 PM   LDLCALC 45 04/21/2021 01:33 PM   LDLCALC 36 04/06/2015 04:42 PM   LDLCALC 90 06/06/2013 09:47 AM    Wt Readings from Last 3 Encounters:  09/25/21 173 lb 4 oz (78.6 kg)  08/15/21 167 lb (75.8 kg)  07/13/21 166 lb (75.3 kg)     Exam:    BP (!) 158/60 (BP Location: Left Arm, Patient Position: Sitting, Cuff Size: Normal)    Pulse 70    Ht 5\' 11"  (1.803 m)    Wt 173 lb 4 oz (78.6 kg)    SpO2 97%    BMI 24.16 kg/m  Constitutional:  oriented to person, place, and time.  No distress.  HENT:  Head: Grossly normal Eyes:  no discharge. No scleral icterus.  Neck: No JVD, no carotid bruits  Cardiovascular: Regular rate and rhythm, no murmurs appreciated Pulmonary/Chest: Clear to auscultation bilaterally, no wheezes or rails Abdominal: Soft.  no distension.  no tenderness.  Musculoskeletal: Normal range of motion Neurological:  normal muscle tone. Coordination normal. No atrophy Skin: Skin warm and dry Psychiatric: normal affect, pleasant  ASSESSMENT & PLAN:    Chronic diastolic CHF/pulmonary hypertension Severely elevated right heart pressures documented on right heart catheterization and echocardiogram dating back several years -Does not want to go to [place],  -Recommend he continue torsemide 20 twice daily, sildenafil 3 times daily BMP today for low sodium and in several weeks time  Atrial fibrillation, unspecified type (HCC) Permanent atrial fibrillation On anticoagulation,  Rate controlled, not on beta-blocker  Coronary artery disease of native artery of native heart with stable angina pectoris (HCC) Currently with no symptoms of angina. No further workup at this time. Continue current medication regimen.  PAD (peripheral artery disease) (HCC) Denies claudication symptoms Significant leg weakness, does not ambulate very far  Bilateral carotid artery disease, unspecified type ([place]) Cholesterol at goal Mild disease on left  S/P CABG (coronary artery bypass graft) Currently with no symptoms of angina.   Essential hypertension Blood pressure elevated on today's visit, recommend to monitor closely at home Reports recent home visit today was 812 systolic    Total encounter time more than 25 minutes  Greater than 50% was spent in counseling and coordination of care with the patient   Signed, Ida Rogue, MD  09/25/2021 5:37 PM    [place] Office [address], [place], [place]  [address]

## 2021-09-25 ENCOUNTER — Ambulatory Visit (INDEPENDENT_AMBULATORY_CARE_PROVIDER_SITE_OTHER): Payer: Medicare HMO | Admitting: Cardiovascular Disease

## 2021-09-25 ENCOUNTER — Encounter: Payer: Self-pay | Admitting: Cardiovascular Disease

## 2021-09-25 ENCOUNTER — Other Ambulatory Visit: Payer: Self-pay

## 2021-09-25 VITALS — BP 158/60 | HR 70 | Ht 71.0 in | Wt 173.2 lb

## 2021-09-25 DIAGNOSIS — I272 Pulmonary hypertension, unspecified: Secondary | ICD-10-CM

## 2021-09-25 DIAGNOSIS — I739 Peripheral vascular disease, unspecified: Secondary | ICD-10-CM | POA: Diagnosis not present

## 2021-09-25 DIAGNOSIS — Z79899 Other long term (current) drug therapy: Secondary | ICD-10-CM | POA: Diagnosis not present

## 2021-09-25 DIAGNOSIS — I25118 Atherosclerotic heart disease of native coronary artery with other forms of angina pectoris: Secondary | ICD-10-CM

## 2021-09-25 DIAGNOSIS — I779 Disorder of arteries and arterioles, unspecified: Secondary | ICD-10-CM

## 2021-09-25 DIAGNOSIS — I10 Essential (primary) hypertension: Secondary | ICD-10-CM

## 2021-09-25 DIAGNOSIS — I5032 Chronic diastolic (congestive) heart failure: Secondary | ICD-10-CM

## 2021-09-25 DIAGNOSIS — E782 Mixed hyperlipidemia: Secondary | ICD-10-CM | POA: Diagnosis not present

## 2021-09-25 MED ORDER — TORSEMIDE 20 MG PO TABS: 20.0000 mg | ORAL_TABLET | Freq: Two times a day (BID) | ORAL | 1 refills | Status: DC

## 2021-09-25 MED ORDER — TORSEMIDE 20 MG PO TABS: 20.0000 mg | ORAL_TABLET | Freq: Two times a day (BID) | ORAL | 3 refills | Status: DC

## 2021-09-25 NOTE — Patient Instructions (Addendum)
Medication Instructions:  Torsemide 20 mg twice a day  If you need a refill on your cardiac medications before your next appointment, please call your pharmacy.   Lab work: BMP today Repeat BMP in 1 month  Testing/Procedures: No new testing needed  Follow-Up: At [facility], you and your health needs are our priority.  As part of our continuing mission to provide you with exceptional heart care, we have created designated Provider Care Teams.  These Care Teams include your primary Cardiologist (physician) and Advanced Practice Providers (APPs -  Physician Assistants and Nurse Practitioners) who all work together to provide you with the care you need, when you need it.  You will need a follow up appointment in 6 months  Providers on your designated Care Team:   Murray Hodgkins, NP Christell Faith, PA-C Cadence Kathlen Mody, [place]  COVID-19 Vaccine Information can be found at: [email or web address] For questions related to vaccine distribution or appointments, please email vaccine@[email or web address] .[email or web address] or call [phone].

## 2021-09-26 LAB — BASIC METABOLIC PANEL
BUN/Creatinine Ratio: 23 (ref 10–24)
BUN: 31 mg/dL — ABNORMAL HIGH (ref 8–27)
CO2: 21 mmol/L (ref 20–29)
Calcium: 9.3 mg/dL (ref 8.6–10.2)
Chloride: 99 mmol/L (ref 96–106)
Creatinine, Ser: 1.32 mg/dL — ABNORMAL HIGH (ref 0.76–1.27)
Glucose: 102 mg/dL — ABNORMAL HIGH (ref 70–99)
Potassium: 4.9 mmol/L (ref 3.5–5.2)
Sodium: 134 mmol/L (ref 134–144)
eGFR: 55 mL/min/{1.73_m2} — ABNORMAL LOW (ref >59)

## 2021-09-28 ENCOUNTER — Other Ambulatory Visit: Payer: Self-pay

## 2021-09-28 ENCOUNTER — Other Ambulatory Visit: Payer: Medicare HMO

## 2021-09-28 VITALS — BP 142/70 | HR 76 | Temp 98.4°F | Resp 20 | Wt 172.0 lb

## 2021-09-28 DIAGNOSIS — Z515 Encounter for palliative care: Secondary | ICD-10-CM

## 2021-09-28 NOTE — Progress Notes (Signed)
PATIENT NAME: Jordan Perkins DOB: 01-Dec-1940 MRN: [id number]  PRIMARY CARE PROVIDER: [name], MD  RESPONSIBLE PARTY:  Acct ID - Guarantor Home Phone Work Phone Relationship Acct Type  [id number] ARUL, [phone]  Self P/F     [place], [place], [place] [address]    PLAN OF CARE and INTERVENTIONS               1.  GOALS OF CARE/ ADVANCE CARE PLANNING:  Remain home and independent.  Continues with assistance from brother and nieces.                2.  PATIENT/CAREGIVER EDUCATION:  CHF               4. PERSONAL EMERGENCY PLAN:  Activate 911 for emergencies.                5.  DISEASE STATUS:  CHF:  Patient continues with daily weights.  Today he is 173 lbs.  1+ ankle edema present to left ankle.  Patient endorses shortness of breath if he is exerting himself to much. This is his baseline.  He will often rest when needed and then continue on with activities.   Patient sleeps with head of bed elevated at 30 degrees and with 2-3 pills.  Discussed notifying cardiology if weight gain is greater than 3 lbs in 24 hours or 5 lbs in one week.   Medication Management:  Reviewed medication.  Patient confirms taking Demadex 20 mg is 2 x daily and  sildenafil 3 times daily.  We discussed using a pill box but patient feels comfortable taking medications daily out of his pill bottles.  No refills needed at this time.   Mobility:  Patient continues with a cane for safe ambulation.   No falls reported.  Patient states he is up moving around in the home to the bathroom and to cook.  Otherwise, he remains in his recliner chair.    HISTORY OF PRESENT ILLNESS:  Jordan Perkins is a 81 y.o. year old male  with multiple medical problems including HFpEF, PAH, PAD, permanent afib, debility.  Patient is being followed by Palliative Care every 4-8 weeks and PRN.   CODE STATUS: Full ADVANCED DIRECTIVES: Yes MOST FORM: Yes PPS: 50%   PHYSICAL EXAM:   VITALS: Today's Vitals    09/28/21 1009  BP: (!) 142/70  Pulse: 76  Resp: 20  Temp: 98.4 F (36.9 C)  SpO2: 98%  Weight: 172 lb (78 kg)  PainSc: 2     LUNGS: clear to auscultation  CARDIAC: Cor RRR}  EXTREMITIES: 1+ pitting edema to the left lower leg SKIN: Skin color, texture, turgor normal. No rashes or lesions or normal  NEURO: positive for gait problems       Lorenza Burton, RN

## 2021-10-11 ENCOUNTER — Other Ambulatory Visit: Payer: Self-pay | Admitting: Cardiovascular Disease

## 2021-10-11 NOTE — Telephone Encounter (Signed)
Refill request

## 2021-10-11 NOTE — Telephone Encounter (Signed)
Xarelto 20 mg refill request received. Pt is 81 years old, weight-78  kg, Crea- 1.32 on 09/25/21, last seen by Dr. Rockey Situ on 09/25/21, Diagnosis- afib, CrCl- 50.88; Dose is appropriate based on dosing criteria. Will send in refill to requested pharmacy.

## 2021-10-26 ENCOUNTER — Other Ambulatory Visit: Payer: Medicaid Other

## 2021-10-26 ENCOUNTER — Other Ambulatory Visit
Admission: RE | Admit: 2021-10-26 | Discharge: 2021-10-26 | Disposition: A | Discharge status: Home / Self Care | Payer: Medicare HMO | Source: Ambulatory Visit | Attending: Cardiovascular Disease | Admitting: Cardiovascular Disease

## 2021-10-26 DIAGNOSIS — Z79899 Other long term (current) drug therapy: Secondary | ICD-10-CM | POA: Insufficient documentation

## 2021-10-26 DIAGNOSIS — I5032 Chronic diastolic (congestive) heart failure: Secondary | ICD-10-CM | POA: Diagnosis not present

## 2021-10-26 LAB — BASIC METABOLIC PANEL
Anion gap: 8 (ref 5–15)
BUN: 39 mg/dL — ABNORMAL HIGH (ref 8–23)
CO2: 25 mmol/L (ref 22–32)
Calcium: 9.1 mg/dL (ref 8.9–10.3)
Chloride: 98 mmol/L (ref 98–111)
Creatinine, Ser: 1.42 mg/dL — ABNORMAL HIGH (ref 0.61–1.24)
GFR, Estimated: 51 mL/min — ABNORMAL LOW (ref >60)
Glucose, Bld: 106 mg/dL — ABNORMAL HIGH (ref 70–99)
Potassium: 4.1 mmol/L (ref 3.5–5.1)
Sodium: 131 mmol/L — ABNORMAL LOW (ref 135–145)

## 2021-10-27 ENCOUNTER — Other Ambulatory Visit: Payer: Self-pay

## 2021-10-27 ENCOUNTER — Other Ambulatory Visit: Payer: Medicare HMO

## 2021-10-27 DIAGNOSIS — Z515 Encounter for palliative care: Secondary | ICD-10-CM

## 2021-10-27 NOTE — Progress Notes (Signed)
PATIENT NAME: Jordan Perkins DOB: 09/23/1940 MRN: [id number]  PRIMARY CARE PROVIDER: [name], MD  RESPONSIBLE PARTY:  Acct ID - Guarantor Home Phone Work Phone Relationship Acct Type  [id number] RALIEGH, [phone]  Self P/F     [place], [place], [place] [address]   Due to the COVID-19 crisis, this visit was done via telemedicine from my office and it was initiated and consent by this patient and or family.  I connected with  Jordan Perkins OR PROXY on 10/27/21 by telephone and verified that I am speaking with the correct person using two identifiers.   I discussed the limitations of evaluation and management by telemedicine. The patient expressed understanding and agreed to proceed.   PLAN OF CARE and INTERVENTIONS:               1.  GOALS OF CARE/ ADVANCE CARE PLANNING:  Remain home and independent.               2. PERSONAL EMERGENCY PLAN:  Activate 911 for emergencies.                3.  DISEASE STATUS:  Connected with patient by telephone.  Patient advises he has been doing well.  Recently had blood work completed for Dr. Rockey Perkins.  Patient waiting results.  Patient denies any recent falls.  No changes in appetite.  Continues to cook and receives MOW.  Patient reports ongoing bilateral foot pain and is taking tylenol as needed.   No new concerns or changes at this time.  Home visit scheduled for 3/7 @ 9 am.   HISTORY OF PRESENT ILLNESS:   Jordan Perkins is a 81 y.o. year old male  with multiple medical problems including HFpEF, PAH, PAD, permanent afib, debility. Patient is being followed by Palliative Care every 4-8 weeks and PRN.  CODE STATUS: Full ADVANCED DIRECTIVES: Yes MOST FORM: Yes PPS: 50%        Jordan Burton, RN

## 2021-11-14 ENCOUNTER — Other Ambulatory Visit: Payer: Medicare HMO

## 2021-11-14 ENCOUNTER — Other Ambulatory Visit: Payer: Self-pay

## 2021-11-14 VITALS — BP 128/60 | HR 58 | Temp 99.3°F | Resp 22 | Wt 168.0 lb

## 2021-11-14 DIAGNOSIS — Z515 Encounter for palliative care: Secondary | ICD-10-CM

## 2021-11-14 NOTE — Progress Notes (Signed)
PATIENT NAME: Jordan Perkins ?DOB: Aug 27, 1941 ?MRN: [id number] ? ?PRIMARY CARE PROVIDER: [name], MD ? ?RESPONSIBLE PARTY:  ?Acct ID - Guarantor Home Phone Work Phone Relationship Acct Type  ?[id number] - [place]* [phone]  Self P/F  ?   [place], [place], [place] [address]  ? ? ?PLAN OF CARE and INTERVENTIONS: ?              1.  GOALS OF CARE/ ADVANCE CARE PLANNING:  Patient desires to remain home with the assistance of family.   ?              2.  PATIENT/CAREGIVER EDUCATION:  CHF ?              4. PERSONAL EMERGENCY PLAN:  Activate 911 for emergencies.  ?              5.  DISEASE STATUS: ? ?CHF:  Patient is weighing himself daily.  Weight remains stable.  Patient endorses fatigue when ambulating.  No complaints of shortness of breath reported.  Re-enforced continuing with daily weights and contacting cardiology for weight gain of 3 lbs in 24 hours or 5 lbs in 1 week.  ? ?Constipation:  Patient is currently taking Senna Plus as needed for constipation.  No further issues reported. ? ?Medication Management:  Currently out of Pantoprazole.  [facility] [place] [facility] will be ready this afternoon.  Patient is not currently using a pill box.  Continues to manage medications without issues. ? ?Pain:  Continues with "arthritic pain" to bilateral feet.  Taking PRN tylenol for pain.  ? ? ?HISTORY OF PRESENT ILLNESS:   Jahrell Perkins is a 81 y.o. year old male  with multiple medical problems including HFpEF, PAH, PAD, permanent afib, debility.  Patient is being followed by Palliative Care every 4-8 weeks and PRN.  ? ?CODE STATUS: Full ?ADVANCED DIRECTIVES: Yes ?MOST FORM: Yes ?PPS: 50% ? ? ?PHYSICAL EXAM:  ? ?VITALS: ?Today's Vitals  ? 11/14/21 0911  ?BP: 128/60  ?Pulse: (!) 58  ?Resp: (!) 22  ?Temp: 99.3 ?F (37.4 ?C)  ?SpO2: 98%  ?Weight: 168 lb (76.2 kg)  ?PainSc: 2   ?  ?LUNGS: clear to auscultation  ?CARDIAC: Cor RRR}  ?EXTREMITIES: - for edema ?SKIN:  Skin color, texture, turgor normal. No rashes or lesions or mobility and turgor normal  ?NEURO: positive for gait problems ? ? ? ? ? ? ?Lorenza Burton, RN ? ?

## 2021-11-14 NOTE — Progress Notes (Signed)
COMMUNITY PALLIATIVE CARE SW NOTE ? ?PATIENT NAME: Jordan Perkins ?DOB: 07-08-1941 ?MRN: [id number] ? ?PRIMARY CARE PROVIDER: [name], MD ? ?RESPONSIBLE PARTY:  ?Acct ID - Guarantor Home Phone Work Phone Relationship Acct Type  ?[id number] Jordan Perkins* [phone]  Self P/F  ?   [place], [place], [place] [address]  ? ? ? ?PLAN OF CARE and INTERVENTIONS:          ?    ?GOALS OF CARE/ ADVANCE CARE PLANNING:  GOALS OF CARE/ ADVANCE CARE PLANNING:  Patient is now a FULL CODE w/ full scope of treatments. MOST completed. Patients niece, Jordan Perkins, holds [place]. Patient's goal is to remain at home with brother. ?  ?2.         SOCIAL/EMOTIONAL/SPIRITUAL ASSESSMENT/ INTERVENTIONS:  SW and RN Almyra Free met with patient in patients home for follow up visit. Patient lives in a single story mobile home with his brother. Patient updated SW an Therapist, sports on medical condition.  ?  ?Functional changes/updates: Patient sitting in recliner. Patient updated PC team on medical changes. No changes, patient doing well. Patient share that he has pain in feet, and believes it is due to arthritis.  Patient share he has not has a bowel movement EOD. NP advised patient to add tylenol and senna to medication regimen to assist with pain and constipation.  ?  ?Patient shares that he sleeps well and naps during the day. No falls reported. Patient uses [facility] for ambulation. Patient has RW and SPC if needed. Appetite remains good, current weight is 168lbs. Patients weighs self daily. ?  ?Medical assessment: RN took vitals and reviewed medications.  ?  ?Psychosocial assessment: completed. No physical needs identified at this time. Patient continues need ongoing support with transportation. Patient also receives MOW's. Family also brings meals weekly. No other needs at this time. ?  ?Palliative care will continue to monitor and assist with long term care planning as needed. ?  ?3.         PATIENT/CAREGIVER EDUCATION/ COPING:  Patient A&O x3, Pt  able to answer all questions appropriately. Patient has normal motor skills. Patient has good reasoning ability. No S/S of depression or anxiety. Patients family is supportive. Brother resides with him and niece lives close by and checks on him often. Patient shares that he enjoyed playing the banjo and guitar. ?  ?4.         PERSONAL EMERGENCY PLAN:  Patient will call 9-1-1 for emergencies. ?  ?5.         COMMUNITY RESOURCES COORDINATION/ HEALTH CARE NAVIGATION:  Patient manages care. ?  ?6.         FINANCIAL/LEGAL CONCERNS/INTERVENTIONS:  None. ?   ? ?SOCIAL HX:  ?Social History  ? ?Tobacco Use  ? Smoking status: Never  ? Smokeless tobacco: Never  ?Substance Use Topics  ? Alcohol use: No  ? ? ?CODE STATUS: Full code ?ADVANCED DIRECTIVES: Y ?MOST FORM COMPLETE: Y ?HOSPICE EDUCATION PROVIDED: N ? ?PPS: Patient is INDP with all ADLs. Patient is A&O x3 with average insight and judgement.  ? ? ?Time spent: 40 min  ? ? ? ? ? ?Doreene Eland, LCSW ? ?

## 2021-11-15 DIAGNOSIS — E261 Secondary hyperaldosteronism: Secondary | ICD-10-CM | POA: Diagnosis not present

## 2021-11-15 DIAGNOSIS — M1990 Unspecified osteoarthritis, unspecified site: Secondary | ICD-10-CM | POA: Diagnosis not present

## 2021-11-15 DIAGNOSIS — K219 Gastro-esophageal reflux disease without esophagitis: Secondary | ICD-10-CM | POA: Diagnosis not present

## 2021-11-15 DIAGNOSIS — R269 Unspecified abnormalities of gait and mobility: Secondary | ICD-10-CM | POA: Diagnosis not present

## 2021-11-15 DIAGNOSIS — I110 Hypertensive heart disease with heart failure: Secondary | ICD-10-CM | POA: Diagnosis not present

## 2021-11-15 DIAGNOSIS — I2510 Atherosclerotic heart disease of native coronary artery without angina pectoris: Secondary | ICD-10-CM | POA: Diagnosis not present

## 2021-11-15 DIAGNOSIS — I509 Heart failure, unspecified: Secondary | ICD-10-CM | POA: Diagnosis not present

## 2021-11-15 DIAGNOSIS — G8929 Other chronic pain: Secondary | ICD-10-CM | POA: Diagnosis not present

## 2021-11-15 DIAGNOSIS — E785 Hyperlipidemia, unspecified: Secondary | ICD-10-CM | POA: Diagnosis not present

## 2021-11-15 DIAGNOSIS — I739 Peripheral vascular disease, unspecified: Secondary | ICD-10-CM | POA: Diagnosis not present

## 2021-11-15 DIAGNOSIS — Z008 Encounter for other general examination: Secondary | ICD-10-CM | POA: Diagnosis not present

## 2021-11-15 DIAGNOSIS — K5900 Constipation, unspecified: Secondary | ICD-10-CM | POA: Diagnosis not present

## 2021-11-15 DIAGNOSIS — D649 Anemia, unspecified: Secondary | ICD-10-CM | POA: Diagnosis not present

## 2021-11-27 ENCOUNTER — Ambulatory Visit: Payer: Medicare HMO | Admitting: Physician Assistant

## 2021-12-20 ENCOUNTER — Other Ambulatory Visit: Payer: Self-pay | Admitting: Family Medicine

## 2022-01-03 ENCOUNTER — Ambulatory Visit: Payer: Medicaid Other | Admitting: Family Medicine

## 2022-01-03 ENCOUNTER — Other Ambulatory Visit: Payer: Self-pay | Admitting: Family Medicine

## 2022-01-24 ENCOUNTER — Telehealth: Payer: Self-pay

## 2022-01-24 NOTE — Telephone Encounter (Signed)
905 am.  Incoming call from patient.  He is currently out of his xarelto and torsemide.  Patient is requesting a refill.  [facility] [place] are present on both prescriptions per Cristie Hem.  Meds to be refilled today.  Visit scheduled with patient on 02/08/22 @ 12 pm. ?

## 2022-02-01 ENCOUNTER — Telehealth: Payer: Self-pay

## 2022-02-01 NOTE — Telephone Encounter (Signed)
Attempted to call pt. No answer no voicemail. Pt is due for a yearly chronic care management follow up. Please schedule if pt returns call.

## 2022-02-08 ENCOUNTER — Other Ambulatory Visit: Payer: Medicare HMO

## 2022-02-08 VITALS — BP 148/60 | HR 62 | Temp 98.2°F | Resp 18 | Wt 168.0 lb

## 2022-02-08 DIAGNOSIS — Z515 Encounter for palliative care: Secondary | ICD-10-CM

## 2022-02-08 NOTE — Progress Notes (Signed)
PATIENT NAME: Jordan Perkins DOB: 1940-09-11 MRN: [id number]  PRIMARY CARE PROVIDER: [name], MD  RESPONSIBLE PARTY:  Acct ID - Guarantor Home Phone Work Phone Relationship Acct Type  [id number] BRAYSON, [phone]  Self P/F     [place], [place], [place] [address]    PLAN OF CARE and INTERVENTIONS:               1.  GOALS OF CARE/ ADVANCE CARE PLANNING:  Remain home with the assistance of his nieces and brother.                2.  PATIENT/CAREGIVER EDUCATION:  Follow Up appointment.                4. PERSONAL EMERGENCY PLAN:  Activate 911 for emergencies.                5.  DISEASE STATUS:  CHF:  Continues to monitor weights daily.  Maintaining at 168 lbs.  Denies any issues with shortness of breath, dizziness or chest pain.  Reports fatigue that is ongoing.   Falls:  Patient endorses a fall outside about 2 weeks ago.  He lost his balance while working in his garden.  Patient advised he crawled back to porch and was able to get up from there.  No injuries reported.   Follow up with PCP:  Advised patient that PCP office had tried to reach him on 02/01/22.  Patient was unaware of this call.  Phone call made to PCP office and visit scheduled for next Tuesday at 9 am.   Medication Management:  Refill needed on pantoprazole one refill left on bottle.   [facility]  Refill expected to be ready on Monday, June 5.    HISTORY OF PRESENT ILLNESS:  Jordan Perkins is a 81 y.o. year old male  with multiple medical problems including HFpEF, PAH, PAD, permanent afib, debility.  Patient is being followed by Palliative Care every 4-8 weeks and PRN.     CODE STATUS: Full ADVANCED DIRECTIVES: Yes MOST FORM: Yes PPS: 50%   PHYSICAL EXAM:   VITALS: Today's Vitals   02/08/22 1212  BP: (!) 148/60  Pulse: 62  Resp: 18  Temp: 98.2 F (36.8 C)  SpO2: 99%  Weight: 168 lb (76.2 kg)    LUNGS: clear to auscultation  CARDIAC: Cor RRR}   EXTREMITIES: 1 + bilateral pitting edema-legs elevated on this visit.  SKIN: Skin color, texture, turgor normal. No rashes or lesions or mobility and turgor normal  NEURO: positive for gait problems       Lorenza Burton, RN

## 2022-02-08 NOTE — Progress Notes (Signed)
COMMUNITY PALLIATIVE CARE SW NOTE  PATIENT NAME: Jordan Perkins DOB: 02-Mar-1941 MRN: [id number]  PRIMARY CARE PROVIDER: [name], MD  RESPONSIBLE PARTY:  Acct ID - Guarantor Home Phone Work Phone Relationship Acct Type  [id number] [place], [place]603 [phone]  Self P/F     [address], [place], [place] [address]     PLAN OF CARE and INTERVENTIONS:              GOALS OF CARE/ ADVANCE CARE PLANNING: Goals include to maximize quality of life and symptom management. Our advance care planning conversation included a discussion about:    The value and importance of advance care planning  Review and updating or creation of an advance directive document.  Code status: FULL CODE. Patients niece is POA.   Palliative care encounter: Palliative medicine team will continue to support patient, patient's family, and medical team. Visit consisted of counseling and education dealing with the complex and emotionally intense issues of symptom management and palliative care in the setting of serious and potentially life-threatening illness. SW completed visit with PC RN J. Owens Shark, met with patient for f/u PC home visit. Patient lives in a single wide mobile home.  Functional changes/updates: Patient has not any functional changes or decline since previous PC visit. Patient continues to be independent with ADL's. Patient reports a fall a couple of weeks while outside in his yard. Patient lost footing. Patient was using SPC at the time. Patient denies any injuries. Fall precautions discussed. Patients uses AD consistently   Psychosocial assessment: completed.  Home support: patients brother resides with him. Transportation: patient depends on [facility] transportation and/or his niece for transport to medical appts. SW called [facility] [phone] to set up transportation to next PCP appt on 02/13/22. Food: no concerns. Patient receives MOW's and niece brings cooked meals weekly. Patient is able to prepare  his own food. Ongoing support/resources will continued to be offered if needed.   Medical assessment: RN reviewed medications and took vitals.  RN scheduled f/u PCP appt with Dr. Caryl Bis for 02/13/22 $[id number]$ . Upcoming appointments: 7/27- Dr. Manuella Ghazi, neurology                                               11/9 - Dr. Ginette Pitman, PCP  Hospice discussion: N/A  SW discussed goals, reviewed care plan, provided emotional support, used active and reflective listening in the form of reciprocity emotional response. Questions and concerns were addressed. The patient/family was encouraged to call with any additional questions and/or concerns. PC Provided general support and encouragement, no other unmet needs identified. Will continue to follow.  3.         PATIENT/CAREGIVER EDUCATION/ COPING:   Appearance: well groomed, appropriate given situation  Mental Status: alert and oriented  Eye Contact: good Thought Process: rational  Thought Content: good Speech: Normal rate, volume, tone  Mood: Normal and calm Affect: Congruent to endorsed mood, full ranging Insight: good Judgement: good Interaction Style: Cooperative Patient A&O x3, Pt able to answer all questions appropriately. Patient has normal motor skills. Patient has good reasoning ability. No S/S of depression or anxiety. Patients family is supportive. Brother resides with him and niece lives close by and checks on him often. Patient shares that he enjoyed playing the banjo and guitar.   4.         PERSONAL EMERGENCY PLAN:  Patient will call 9-1-1 for emergencies.   5.         COMMUNITY RESOURCES COORDINATION/ HEALTH CARE NAVIGATION:  Patient manages care.   6.         FINANCIAL/LEGAL CONCERNS/INTERVENTIONS:  None   Primary Health Insurance: [employer] Secondary Health Insurance: medicaid Prescription Coverage: Yes, no history of difficulty obtaining or affording prescriptions reported     SOCIAL HX:  Social History   Tobacco Use   Smoking  status: Never   Smokeless tobacco: Never  Substance Use Topics   Alcohol use: No    CODE STATUS: FULL CODE ADVANCED DIRECTIVES: N MOST FORM COMPLETE:  Y HOSPICE EDUCATION PROVIDED: N  PPS: Patient is Independent with all ADL's at this time. Patient is A&O  with good insight and judgement.    Time spent: 40 min    [place] Henrene Pastor, [place]

## 2022-02-13 ENCOUNTER — Ambulatory Visit (INDEPENDENT_AMBULATORY_CARE_PROVIDER_SITE_OTHER): Payer: Medicare HMO | Admitting: Family Medicine

## 2022-02-13 ENCOUNTER — Encounter: Payer: Self-pay | Admitting: Family Medicine

## 2022-02-13 ENCOUNTER — Ambulatory Visit (INDEPENDENT_AMBULATORY_CARE_PROVIDER_SITE_OTHER): Payer: Medicare HMO

## 2022-02-13 VITALS — BP 140/60 | HR 67 | Temp 97.9°F | Ht 71.0 in | Wt 172.0 lb

## 2022-02-13 DIAGNOSIS — I4811 Longstanding persistent atrial fibrillation: Secondary | ICD-10-CM

## 2022-02-13 DIAGNOSIS — M21371 Foot drop, right foot: Secondary | ICD-10-CM

## 2022-02-13 DIAGNOSIS — I872 Venous insufficiency (chronic) (peripheral): Secondary | ICD-10-CM

## 2022-02-13 DIAGNOSIS — E871 Hypo-osmolality and hyponatremia: Secondary | ICD-10-CM

## 2022-02-13 DIAGNOSIS — M545 Low back pain, unspecified: Secondary | ICD-10-CM | POA: Diagnosis not present

## 2022-02-13 DIAGNOSIS — I2510 Atherosclerotic heart disease of native coronary artery without angina pectoris: Secondary | ICD-10-CM | POA: Diagnosis not present

## 2022-02-13 DIAGNOSIS — I10 Essential (primary) hypertension: Secondary | ICD-10-CM | POA: Diagnosis not present

## 2022-02-13 DIAGNOSIS — R29898 Other symptoms and signs involving the musculoskeletal system: Secondary | ICD-10-CM

## 2022-02-13 DIAGNOSIS — R7303 Prediabetes: Secondary | ICD-10-CM

## 2022-02-13 DIAGNOSIS — G629 Polyneuropathy, unspecified: Secondary | ICD-10-CM

## 2022-02-13 LAB — BASIC METABOLIC PANEL
BUN: 19 mg/dL (ref 6–23)
CO2: 29 mEq/L (ref 19–32)
Calcium: 9.7 mg/dL (ref 8.4–10.5)
Chloride: 91 mEq/L — ABNORMAL LOW (ref 96–112)
Creatinine, Ser: 0.93 mg/dL (ref 0.40–1.50)
GFR: 78.62 mL/min (ref >60.00)
Glucose, Bld: 86 mg/dL (ref 70–99)
Potassium: 4.2 mEq/L (ref 3.5–5.1)
Sodium: 129 mEq/L — ABNORMAL LOW (ref 135–145)

## 2022-02-13 LAB — HEMOGLOBIN A1C: Hgb A1c MFr Bld: 5.9 % (ref 4.6–6.5)

## 2022-02-13 LAB — VITAMIN B12: Vitamin B-12: 1054 pg/mL — ABNORMAL HIGH (ref 211–911)

## 2022-02-13 NOTE — Assessment & Plan Note (Signed)
Check A1c. 

## 2022-02-13 NOTE — Assessment & Plan Note (Signed)
Likely related to prediabetes. We will check his A1c and B12.

## 2022-02-13 NOTE — Assessment & Plan Note (Signed)
Likely deconditioning related though he does have some right foot drop that could indicate a nerve impingement issue.  We will get an x-ray of his lumbar spine today.  We will refer for physical therapy.  Consider further imaging and referrals once the x-ray returns.

## 2022-02-13 NOTE — Assessment & Plan Note (Signed)
Adequate control.  He will continue torsemide 20 mg twice daily.

## 2022-02-13 NOTE — Assessment & Plan Note (Signed)
Sinus rhythm.  He will continue Xarelto 20 mg daily.

## 2022-02-13 NOTE — Assessment & Plan Note (Signed)
Discussed he likely has venous insufficiency. Recommended propping his legs up as much as possible.

## 2022-02-13 NOTE — Patient Instructions (Signed)
Nice to see you. We will have home health physical therapy work with you to try to strengthen your legs. We will get some blood work today. We will get an x-ray of your back as well.

## 2022-02-13 NOTE — Assessment & Plan Note (Signed)
Asymptomatic.  Patient will continue risk factor reduction.

## 2022-02-13 NOTE — Progress Notes (Signed)
Jordan Rumps, MD Phone: [phone]  Jordan Perkins is a 81 y.o. male who presents today for f/u.  CAD/A-fib/hypertension: No chest pain, shortness of breath, palpitations, or bleeding.  He does note some edema during the day that resolves overnight.  He is taking Lipitor, Revatio, torsemide, and Xarelto.  Leg pain/weakness: Patient notes chronic pain in his knees and feet.  He also notes that his legs seem to be weak.  Notes his feet feel numb at times for the last several months.  He notes a couple weeks ago he had a fall where he lost his balance in the garden though he had no injury.  He did not hit his head.  He has noticed some right foot drop for some time now.  No history of back pain. He does note occasional tingling in his feet.   Social History   Tobacco Use  Smoking Status Never  Smokeless Tobacco Never    Current Outpatient Medications on File Prior to Visit  Medication Sig Dispense Refill   atorvastatin (LIPITOR) 80 MG tablet TAKE 1 TABLET BY MOUTH EVERY DAY 90 tablet 1   ferrous sulfate 325 (65 FE) MG tablet TAKE 1 TABLET BY MOUTH EVERY DAY 90 tablet 1   pantoprazole (PROTONIX) 40 MG tablet TAKE 1 TABLET BY MOUTH EVERY DAY 90 tablet 2   sildenafil (REVATIO) 20 MG tablet Take 1 tablet (20 mg total) by mouth 3 (three) times daily. 90 tablet 1   torsemide (DEMADEX) 20 MG tablet Take 1 tablet (20 mg total) by mouth 2 (two) times daily. 180 tablet 3   XARELTO 20 MG TABS tablet TAKE 1 TABLET BY MOUTH DAILY WITH SUPPER 90 tablet 1   No current facility-administered medications on file prior to visit.     ROS see history of present illness  Objective  Physical Exam Vitals:   02/13/22 0919  BP: 140/60  Pulse: 67  Temp: 97.9 F (36.6 C)  SpO2: 97%    BP Readings from Last 3 Encounters:  02/13/22 140/60  02/08/22 (!) 148/60  11/14/21 128/60   Wt Readings from Last 3 Encounters:  02/13/22 172 lb (78 kg)  02/08/22 168 lb (76.2 kg)  11/14/21 168 lb (76.2  kg)    Physical Exam Constitutional:      General: He is not in acute distress.    Appearance: He is not diaphoretic.  Cardiovascular:     Rate and Rhythm: Normal rate and regular rhythm.     Heart sounds: Normal heart sounds.  Pulmonary:     Effort: Pulmonary effort is normal.     Breath sounds: Normal breath sounds.  Musculoskeletal:     Comments: Bilateral knees are nontender, no swelling in his knees, feet are sore to palpation in the arches, 1+ pitting edema bilaterally with venous stasis skin changes noted  Skin:    General: Skin is warm and dry.  Neurological:     Mental Status: He is alert.     Comments: 4/5 strength right foot dorsiflexion, 5/5 strength right foot plantarflexion, left foot dorsiflexion and plantarflexion, bilateral quads, and hamstrings, sensation to light touch intact bilateral lower extremities     Assessment/Plan: Please see individual problem list.  Problem List Items Addressed This Visit     Atrial fibrillation (HCC) (Chronic)    Sinus rhythm.  He will continue Xarelto 20 mg daily.       Bilateral leg weakness (Chronic)    Likely deconditioning related though he does have some right  foot drop that could indicate a nerve impingement issue.  We will get an x-ray of his lumbar spine today.  We will refer for physical therapy.  Consider further imaging and referrals once the x-ray returns.       Relevant Orders   Ambulatory referral to Home Health   CAD (coronary artery disease) - Primary (Chronic)    Asymptomatic.  Patient will continue risk factor reduction.       Essential hypertension (Chronic)    Adequate control.  He will continue torsemide 20 mg twice daily.       Neuropathy (Chronic)    Likely related to prediabetes. We will check his A1c and B12.        Relevant Orders   B12   Prediabetes (Chronic)    Check A1c.       Relevant Orders   HgB A1c   Hyponatremia    Recheck today.       Relevant Orders   Basic  Metabolic Panel (BMET)   Right foot drop   Relevant Orders   DG Lumbar Spine Complete   Ambulatory referral to Home Health   Venous insufficiency    Discussed he likely has venous insufficiency. Recommended propping his legs up as much as possible.         Return in about 6 weeks (around 03/27/2022) for Leg weakness.   Jordan Rumps, MD [place]

## 2022-02-13 NOTE — Assessment & Plan Note (Signed)
Recheck today. 

## 2022-02-16 ENCOUNTER — Other Ambulatory Visit: Payer: Self-pay | Admitting: Family Medicine

## 2022-02-16 DIAGNOSIS — G8929 Other chronic pain: Secondary | ICD-10-CM | POA: Diagnosis not present

## 2022-02-16 DIAGNOSIS — Z9181 History of falling: Secondary | ICD-10-CM | POA: Diagnosis not present

## 2022-02-16 DIAGNOSIS — I4891 Unspecified atrial fibrillation: Secondary | ICD-10-CM | POA: Diagnosis not present

## 2022-02-16 DIAGNOSIS — M21371 Foot drop, right foot: Secondary | ICD-10-CM

## 2022-02-16 DIAGNOSIS — I10 Essential (primary) hypertension: Secondary | ICD-10-CM | POA: Diagnosis not present

## 2022-02-16 DIAGNOSIS — G629 Polyneuropathy, unspecified: Secondary | ICD-10-CM | POA: Diagnosis not present

## 2022-02-16 DIAGNOSIS — M6289 Other specified disorders of muscle: Secondary | ICD-10-CM | POA: Diagnosis not present

## 2022-02-16 DIAGNOSIS — M25562 Pain in left knee: Secondary | ICD-10-CM | POA: Diagnosis not present

## 2022-02-16 DIAGNOSIS — Z7901 Long term (current) use of anticoagulants: Secondary | ICD-10-CM | POA: Diagnosis not present

## 2022-02-16 DIAGNOSIS — I872 Venous insufficiency (chronic) (peripheral): Secondary | ICD-10-CM | POA: Diagnosis not present

## 2022-02-16 DIAGNOSIS — E871 Hypo-osmolality and hyponatremia: Secondary | ICD-10-CM | POA: Diagnosis not present

## 2022-02-16 DIAGNOSIS — M25561 Pain in right knee: Secondary | ICD-10-CM | POA: Diagnosis not present

## 2022-02-16 DIAGNOSIS — I2510 Atherosclerotic heart disease of native coronary artery without angina pectoris: Secondary | ICD-10-CM | POA: Diagnosis not present

## 2022-02-16 DIAGNOSIS — R7303 Prediabetes: Secondary | ICD-10-CM | POA: Diagnosis not present

## 2022-02-21 DIAGNOSIS — G629 Polyneuropathy, unspecified: Secondary | ICD-10-CM | POA: Diagnosis not present

## 2022-02-21 DIAGNOSIS — Z9181 History of falling: Secondary | ICD-10-CM | POA: Diagnosis not present

## 2022-02-21 DIAGNOSIS — I2510 Atherosclerotic heart disease of native coronary artery without angina pectoris: Secondary | ICD-10-CM | POA: Diagnosis not present

## 2022-02-21 DIAGNOSIS — I872 Venous insufficiency (chronic) (peripheral): Secondary | ICD-10-CM | POA: Diagnosis not present

## 2022-02-21 DIAGNOSIS — M25562 Pain in left knee: Secondary | ICD-10-CM | POA: Diagnosis not present

## 2022-02-21 DIAGNOSIS — R7303 Prediabetes: Secondary | ICD-10-CM | POA: Diagnosis not present

## 2022-02-21 DIAGNOSIS — E871 Hypo-osmolality and hyponatremia: Secondary | ICD-10-CM | POA: Diagnosis not present

## 2022-02-21 DIAGNOSIS — Z7901 Long term (current) use of anticoagulants: Secondary | ICD-10-CM | POA: Diagnosis not present

## 2022-02-21 DIAGNOSIS — G8929 Other chronic pain: Secondary | ICD-10-CM | POA: Diagnosis not present

## 2022-02-21 DIAGNOSIS — I10 Essential (primary) hypertension: Secondary | ICD-10-CM | POA: Diagnosis not present

## 2022-02-21 DIAGNOSIS — I4891 Unspecified atrial fibrillation: Secondary | ICD-10-CM | POA: Diagnosis not present

## 2022-02-21 DIAGNOSIS — M25561 Pain in right knee: Secondary | ICD-10-CM | POA: Diagnosis not present

## 2022-02-21 DIAGNOSIS — M21371 Foot drop, right foot: Secondary | ICD-10-CM | POA: Diagnosis not present

## 2022-02-22 ENCOUNTER — Other Ambulatory Visit: Payer: Self-pay

## 2022-02-22 DIAGNOSIS — I10 Essential (primary) hypertension: Secondary | ICD-10-CM

## 2022-02-27 DIAGNOSIS — E871 Hypo-osmolality and hyponatremia: Secondary | ICD-10-CM | POA: Diagnosis not present

## 2022-02-27 DIAGNOSIS — I2510 Atherosclerotic heart disease of native coronary artery without angina pectoris: Secondary | ICD-10-CM | POA: Diagnosis not present

## 2022-02-27 DIAGNOSIS — Z9181 History of falling: Secondary | ICD-10-CM | POA: Diagnosis not present

## 2022-02-27 DIAGNOSIS — I4891 Unspecified atrial fibrillation: Secondary | ICD-10-CM | POA: Diagnosis not present

## 2022-02-27 DIAGNOSIS — G629 Polyneuropathy, unspecified: Secondary | ICD-10-CM | POA: Diagnosis not present

## 2022-02-27 DIAGNOSIS — R7303 Prediabetes: Secondary | ICD-10-CM | POA: Diagnosis not present

## 2022-02-27 DIAGNOSIS — I10 Essential (primary) hypertension: Secondary | ICD-10-CM | POA: Diagnosis not present

## 2022-02-27 DIAGNOSIS — I872 Venous insufficiency (chronic) (peripheral): Secondary | ICD-10-CM | POA: Diagnosis not present

## 2022-02-27 DIAGNOSIS — Z7901 Long term (current) use of anticoagulants: Secondary | ICD-10-CM | POA: Diagnosis not present

## 2022-02-27 DIAGNOSIS — G8929 Other chronic pain: Secondary | ICD-10-CM | POA: Diagnosis not present

## 2022-02-27 DIAGNOSIS — M25562 Pain in left knee: Secondary | ICD-10-CM | POA: Diagnosis not present

## 2022-02-27 DIAGNOSIS — M25561 Pain in right knee: Secondary | ICD-10-CM | POA: Diagnosis not present

## 2022-02-27 DIAGNOSIS — M21371 Foot drop, right foot: Secondary | ICD-10-CM | POA: Diagnosis not present

## 2022-02-28 ENCOUNTER — Ambulatory Visit: Payer: Medicare HMO

## 2022-03-01 ENCOUNTER — Other Ambulatory Visit: Payer: Medicare HMO

## 2022-03-02 DIAGNOSIS — M25561 Pain in right knee: Secondary | ICD-10-CM | POA: Diagnosis not present

## 2022-03-02 DIAGNOSIS — M25562 Pain in left knee: Secondary | ICD-10-CM | POA: Diagnosis not present

## 2022-03-02 DIAGNOSIS — M21371 Foot drop, right foot: Secondary | ICD-10-CM | POA: Diagnosis not present

## 2022-03-02 DIAGNOSIS — I4891 Unspecified atrial fibrillation: Secondary | ICD-10-CM | POA: Diagnosis not present

## 2022-03-02 DIAGNOSIS — I2510 Atherosclerotic heart disease of native coronary artery without angina pectoris: Secondary | ICD-10-CM | POA: Diagnosis not present

## 2022-03-02 DIAGNOSIS — Z9181 History of falling: Secondary | ICD-10-CM | POA: Diagnosis not present

## 2022-03-02 DIAGNOSIS — R7303 Prediabetes: Secondary | ICD-10-CM | POA: Diagnosis not present

## 2022-03-02 DIAGNOSIS — I10 Essential (primary) hypertension: Secondary | ICD-10-CM | POA: Diagnosis not present

## 2022-03-02 DIAGNOSIS — G629 Polyneuropathy, unspecified: Secondary | ICD-10-CM | POA: Diagnosis not present

## 2022-03-02 DIAGNOSIS — G8929 Other chronic pain: Secondary | ICD-10-CM | POA: Diagnosis not present

## 2022-03-02 DIAGNOSIS — Z7901 Long term (current) use of anticoagulants: Secondary | ICD-10-CM

## 2022-03-02 DIAGNOSIS — I872 Venous insufficiency (chronic) (peripheral): Secondary | ICD-10-CM | POA: Diagnosis not present

## 2022-03-02 DIAGNOSIS — E871 Hypo-osmolality and hyponatremia: Secondary | ICD-10-CM | POA: Diagnosis not present

## 2022-03-06 DIAGNOSIS — M25562 Pain in left knee: Secondary | ICD-10-CM | POA: Diagnosis not present

## 2022-03-06 DIAGNOSIS — M21371 Foot drop, right foot: Secondary | ICD-10-CM | POA: Diagnosis not present

## 2022-03-06 DIAGNOSIS — R7303 Prediabetes: Secondary | ICD-10-CM | POA: Diagnosis not present

## 2022-03-06 DIAGNOSIS — G8929 Other chronic pain: Secondary | ICD-10-CM | POA: Diagnosis not present

## 2022-03-06 DIAGNOSIS — I2510 Atherosclerotic heart disease of native coronary artery without angina pectoris: Secondary | ICD-10-CM | POA: Diagnosis not present

## 2022-03-06 DIAGNOSIS — I10 Essential (primary) hypertension: Secondary | ICD-10-CM | POA: Diagnosis not present

## 2022-03-06 DIAGNOSIS — I872 Venous insufficiency (chronic) (peripheral): Secondary | ICD-10-CM | POA: Diagnosis not present

## 2022-03-06 DIAGNOSIS — Z9181 History of falling: Secondary | ICD-10-CM | POA: Diagnosis not present

## 2022-03-06 DIAGNOSIS — I4891 Unspecified atrial fibrillation: Secondary | ICD-10-CM | POA: Diagnosis not present

## 2022-03-06 DIAGNOSIS — M25561 Pain in right knee: Secondary | ICD-10-CM | POA: Diagnosis not present

## 2022-03-06 DIAGNOSIS — G629 Polyneuropathy, unspecified: Secondary | ICD-10-CM | POA: Diagnosis not present

## 2022-03-06 DIAGNOSIS — Z7901 Long term (current) use of anticoagulants: Secondary | ICD-10-CM | POA: Diagnosis not present

## 2022-03-06 DIAGNOSIS — E871 Hypo-osmolality and hyponatremia: Secondary | ICD-10-CM | POA: Diagnosis not present

## 2022-03-08 DIAGNOSIS — Z9181 History of falling: Secondary | ICD-10-CM | POA: Diagnosis not present

## 2022-03-08 DIAGNOSIS — M25561 Pain in right knee: Secondary | ICD-10-CM | POA: Diagnosis not present

## 2022-03-08 DIAGNOSIS — G8929 Other chronic pain: Secondary | ICD-10-CM | POA: Diagnosis not present

## 2022-03-08 DIAGNOSIS — M21371 Foot drop, right foot: Secondary | ICD-10-CM | POA: Diagnosis not present

## 2022-03-08 DIAGNOSIS — I2510 Atherosclerotic heart disease of native coronary artery without angina pectoris: Secondary | ICD-10-CM | POA: Diagnosis not present

## 2022-03-08 DIAGNOSIS — I872 Venous insufficiency (chronic) (peripheral): Secondary | ICD-10-CM | POA: Diagnosis not present

## 2022-03-08 DIAGNOSIS — Z7901 Long term (current) use of anticoagulants: Secondary | ICD-10-CM | POA: Diagnosis not present

## 2022-03-08 DIAGNOSIS — G629 Polyneuropathy, unspecified: Secondary | ICD-10-CM | POA: Diagnosis not present

## 2022-03-08 DIAGNOSIS — R7303 Prediabetes: Secondary | ICD-10-CM | POA: Diagnosis not present

## 2022-03-08 DIAGNOSIS — E871 Hypo-osmolality and hyponatremia: Secondary | ICD-10-CM | POA: Diagnosis not present

## 2022-03-08 DIAGNOSIS — I10 Essential (primary) hypertension: Secondary | ICD-10-CM | POA: Diagnosis not present

## 2022-03-08 DIAGNOSIS — M25562 Pain in left knee: Secondary | ICD-10-CM | POA: Diagnosis not present

## 2022-03-08 DIAGNOSIS — I4891 Unspecified atrial fibrillation: Secondary | ICD-10-CM | POA: Diagnosis not present

## 2022-03-12 DIAGNOSIS — Z9181 History of falling: Secondary | ICD-10-CM | POA: Diagnosis not present

## 2022-03-12 DIAGNOSIS — M21371 Foot drop, right foot: Secondary | ICD-10-CM | POA: Diagnosis not present

## 2022-03-12 DIAGNOSIS — M25562 Pain in left knee: Secondary | ICD-10-CM | POA: Diagnosis not present

## 2022-03-12 DIAGNOSIS — M25561 Pain in right knee: Secondary | ICD-10-CM | POA: Diagnosis not present

## 2022-03-12 DIAGNOSIS — I4891 Unspecified atrial fibrillation: Secondary | ICD-10-CM | POA: Diagnosis not present

## 2022-03-12 DIAGNOSIS — Z7901 Long term (current) use of anticoagulants: Secondary | ICD-10-CM | POA: Diagnosis not present

## 2022-03-12 DIAGNOSIS — I2510 Atherosclerotic heart disease of native coronary artery without angina pectoris: Secondary | ICD-10-CM | POA: Diagnosis not present

## 2022-03-12 DIAGNOSIS — G8929 Other chronic pain: Secondary | ICD-10-CM | POA: Diagnosis not present

## 2022-03-12 DIAGNOSIS — I10 Essential (primary) hypertension: Secondary | ICD-10-CM | POA: Diagnosis not present

## 2022-03-12 DIAGNOSIS — G629 Polyneuropathy, unspecified: Secondary | ICD-10-CM | POA: Diagnosis not present

## 2022-03-12 DIAGNOSIS — E871 Hypo-osmolality and hyponatremia: Secondary | ICD-10-CM | POA: Diagnosis not present

## 2022-03-12 DIAGNOSIS — R7303 Prediabetes: Secondary | ICD-10-CM | POA: Diagnosis not present

## 2022-03-12 DIAGNOSIS — I872 Venous insufficiency (chronic) (peripheral): Secondary | ICD-10-CM | POA: Diagnosis not present

## 2022-03-16 ENCOUNTER — Telehealth: Payer: Self-pay | Admitting: Family Medicine

## 2022-03-16 NOTE — Telephone Encounter (Signed)
Home health called requesting verbal orders for nursing. She requested his recent ABI results, though I advised that he has not had any ABIs recently and she noted that they would only need ABIs if they felt he needs unna boots for his swelling. Verbal orders given for home health RN.

## 2022-03-20 DIAGNOSIS — G8929 Other chronic pain: Secondary | ICD-10-CM | POA: Diagnosis not present

## 2022-03-20 DIAGNOSIS — I4891 Unspecified atrial fibrillation: Secondary | ICD-10-CM | POA: Diagnosis not present

## 2022-03-20 DIAGNOSIS — R7303 Prediabetes: Secondary | ICD-10-CM | POA: Diagnosis not present

## 2022-03-20 DIAGNOSIS — Z7901 Long term (current) use of anticoagulants: Secondary | ICD-10-CM | POA: Diagnosis not present

## 2022-03-20 DIAGNOSIS — M21371 Foot drop, right foot: Secondary | ICD-10-CM | POA: Diagnosis not present

## 2022-03-20 DIAGNOSIS — M25561 Pain in right knee: Secondary | ICD-10-CM | POA: Diagnosis not present

## 2022-03-20 DIAGNOSIS — M25562 Pain in left knee: Secondary | ICD-10-CM | POA: Diagnosis not present

## 2022-03-20 DIAGNOSIS — G629 Polyneuropathy, unspecified: Secondary | ICD-10-CM | POA: Diagnosis not present

## 2022-03-20 DIAGNOSIS — I2510 Atherosclerotic heart disease of native coronary artery without angina pectoris: Secondary | ICD-10-CM | POA: Diagnosis not present

## 2022-03-20 DIAGNOSIS — Z9181 History of falling: Secondary | ICD-10-CM | POA: Diagnosis not present

## 2022-03-20 DIAGNOSIS — E871 Hypo-osmolality and hyponatremia: Secondary | ICD-10-CM | POA: Diagnosis not present

## 2022-03-20 DIAGNOSIS — I10 Essential (primary) hypertension: Secondary | ICD-10-CM | POA: Diagnosis not present

## 2022-03-20 DIAGNOSIS — I872 Venous insufficiency (chronic) (peripheral): Secondary | ICD-10-CM | POA: Diagnosis not present

## 2022-03-22 DIAGNOSIS — I2510 Atherosclerotic heart disease of native coronary artery without angina pectoris: Secondary | ICD-10-CM | POA: Diagnosis not present

## 2022-03-22 DIAGNOSIS — I10 Essential (primary) hypertension: Secondary | ICD-10-CM | POA: Diagnosis not present

## 2022-03-22 DIAGNOSIS — M25561 Pain in right knee: Secondary | ICD-10-CM | POA: Diagnosis not present

## 2022-03-22 DIAGNOSIS — M21371 Foot drop, right foot: Secondary | ICD-10-CM | POA: Diagnosis not present

## 2022-03-22 DIAGNOSIS — E871 Hypo-osmolality and hyponatremia: Secondary | ICD-10-CM | POA: Diagnosis not present

## 2022-03-22 DIAGNOSIS — R7303 Prediabetes: Secondary | ICD-10-CM | POA: Diagnosis not present

## 2022-03-22 DIAGNOSIS — M25562 Pain in left knee: Secondary | ICD-10-CM | POA: Diagnosis not present

## 2022-03-22 DIAGNOSIS — Z7901 Long term (current) use of anticoagulants: Secondary | ICD-10-CM | POA: Diagnosis not present

## 2022-03-22 DIAGNOSIS — G8929 Other chronic pain: Secondary | ICD-10-CM | POA: Diagnosis not present

## 2022-03-22 DIAGNOSIS — I4891 Unspecified atrial fibrillation: Secondary | ICD-10-CM | POA: Diagnosis not present

## 2022-03-22 DIAGNOSIS — Z9181 History of falling: Secondary | ICD-10-CM | POA: Diagnosis not present

## 2022-03-22 DIAGNOSIS — I872 Venous insufficiency (chronic) (peripheral): Secondary | ICD-10-CM | POA: Diagnosis not present

## 2022-03-22 DIAGNOSIS — G629 Polyneuropathy, unspecified: Secondary | ICD-10-CM | POA: Diagnosis not present

## 2022-03-27 ENCOUNTER — Ambulatory Visit: Payer: Medicare HMO | Admitting: Family Medicine

## 2022-03-27 DIAGNOSIS — I872 Venous insufficiency (chronic) (peripheral): Secondary | ICD-10-CM | POA: Diagnosis not present

## 2022-03-27 DIAGNOSIS — Z9181 History of falling: Secondary | ICD-10-CM | POA: Diagnosis not present

## 2022-03-27 DIAGNOSIS — I4891 Unspecified atrial fibrillation: Secondary | ICD-10-CM | POA: Diagnosis not present

## 2022-03-27 DIAGNOSIS — G8929 Other chronic pain: Secondary | ICD-10-CM | POA: Diagnosis not present

## 2022-03-27 DIAGNOSIS — E871 Hypo-osmolality and hyponatremia: Secondary | ICD-10-CM | POA: Diagnosis not present

## 2022-03-27 DIAGNOSIS — M25562 Pain in left knee: Secondary | ICD-10-CM | POA: Diagnosis not present

## 2022-03-27 DIAGNOSIS — R7303 Prediabetes: Secondary | ICD-10-CM | POA: Diagnosis not present

## 2022-03-27 DIAGNOSIS — I2510 Atherosclerotic heart disease of native coronary artery without angina pectoris: Secondary | ICD-10-CM | POA: Diagnosis not present

## 2022-03-27 DIAGNOSIS — M25561 Pain in right knee: Secondary | ICD-10-CM | POA: Diagnosis not present

## 2022-03-27 DIAGNOSIS — G629 Polyneuropathy, unspecified: Secondary | ICD-10-CM | POA: Diagnosis not present

## 2022-03-27 DIAGNOSIS — M21371 Foot drop, right foot: Secondary | ICD-10-CM | POA: Diagnosis not present

## 2022-03-27 DIAGNOSIS — I10 Essential (primary) hypertension: Secondary | ICD-10-CM | POA: Diagnosis not present

## 2022-03-27 DIAGNOSIS — Z7901 Long term (current) use of anticoagulants: Secondary | ICD-10-CM | POA: Diagnosis not present

## 2022-03-29 IMAGING — CT CT HEAD W/O CM
3 of 5 series · 15 of 47 positions shown, 18 images · non-contrast
Comparison: 03/23/2020

CLINICAL DATA: Altered mental status which began this morning.
Admitted yesterday for weakness and recent falls.

EXAM:
CT HEAD WITHOUT CONTRAST
TECHNIQUE: Contiguous axial images were obtained from the base of the skull
through the vertex without intravenous contrast.

[Series 3: head wo · axial · 0.42mm/px · z∈[-148,-8]mm · 9 of 32 slices shown, 12 images]
[im 2/32  brain]
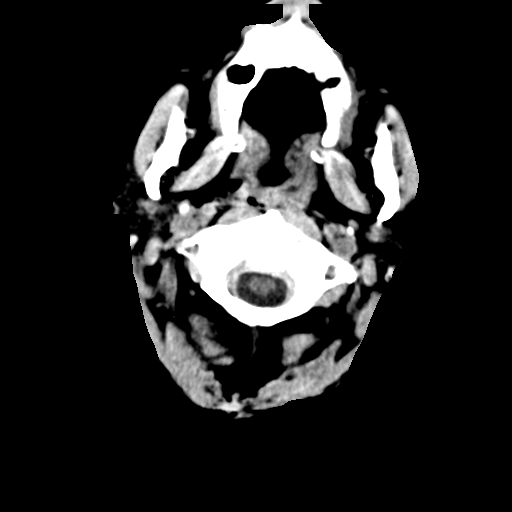
[im 2/32  bone]
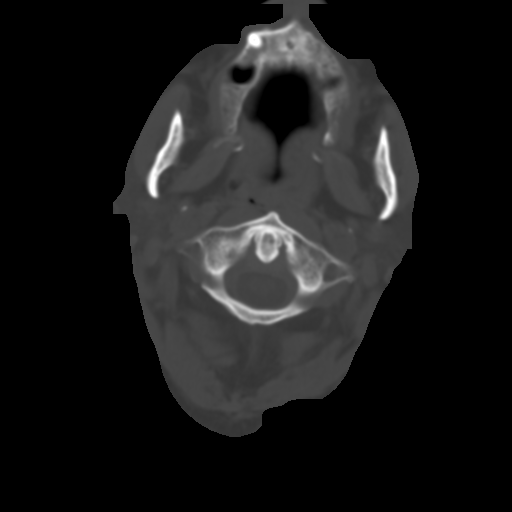
[im 6/32  brain]
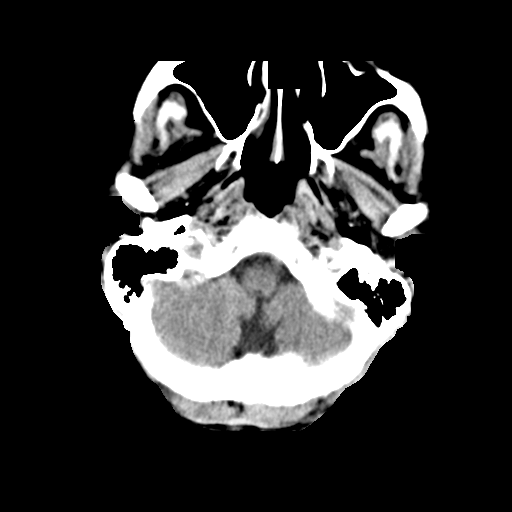
[im 10/32  brain]
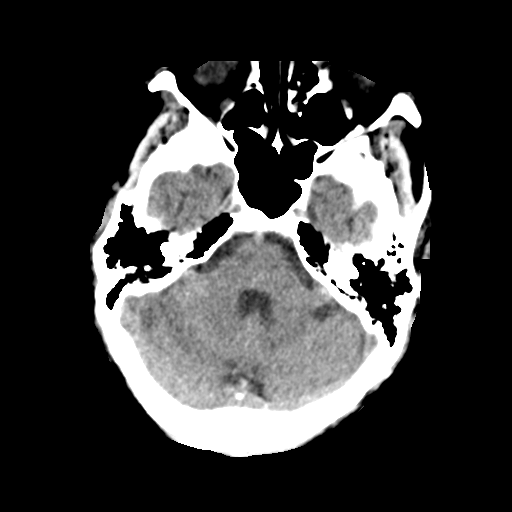
[im 13/32  brain]
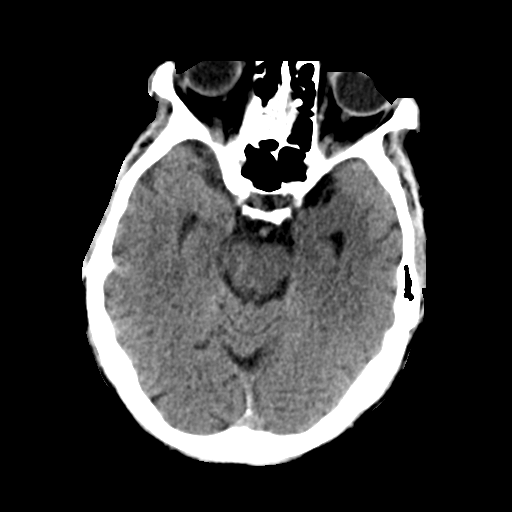
[im 17/32  brain]
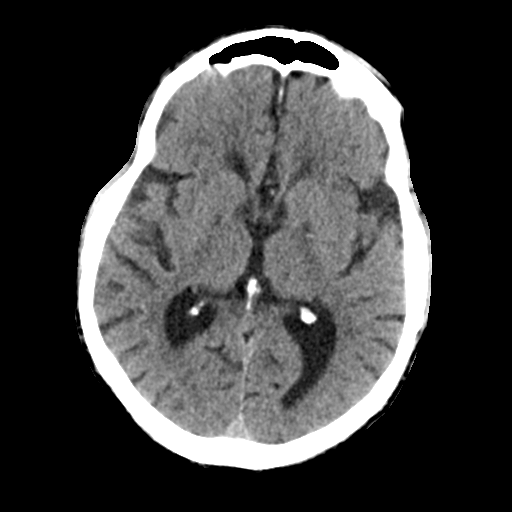
[im 17/32  bone]
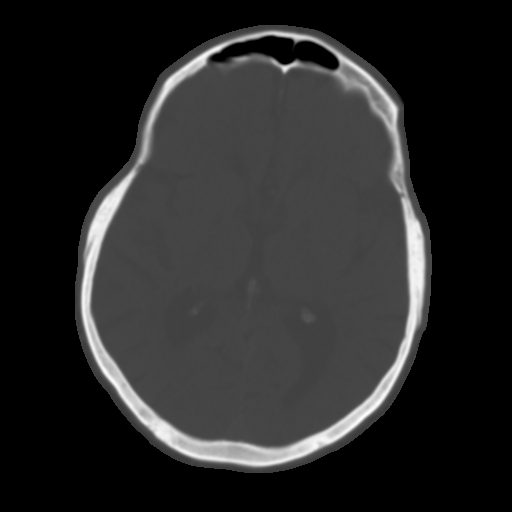
[im 19/32  brain]
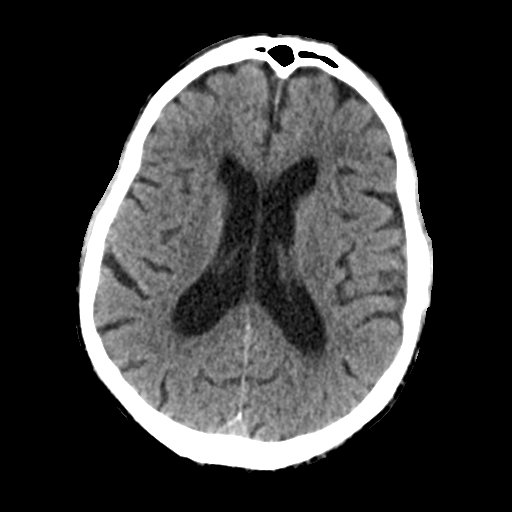
[im 22/32  brain]
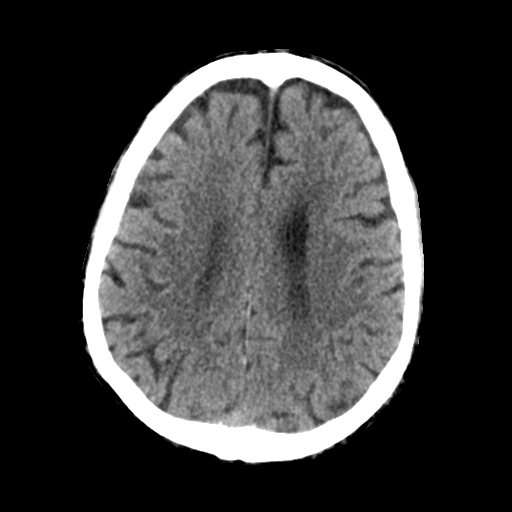
[im 26/32  brain]
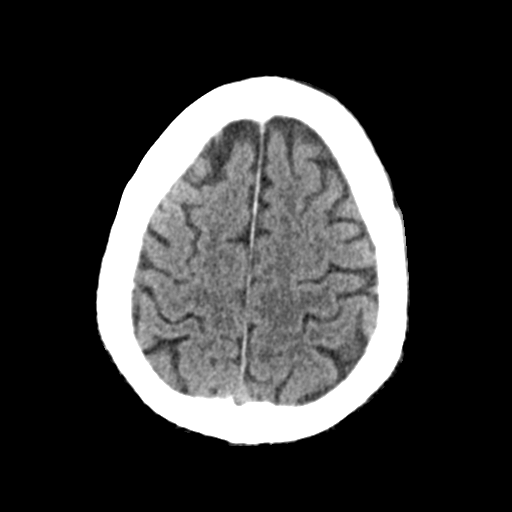
[im 30/32  brain]
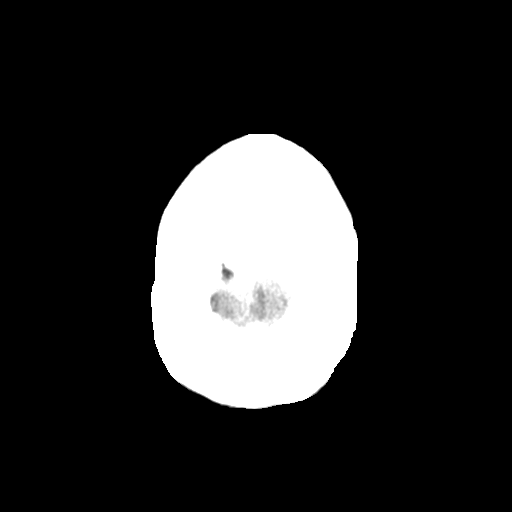
[im 30/32  bone]
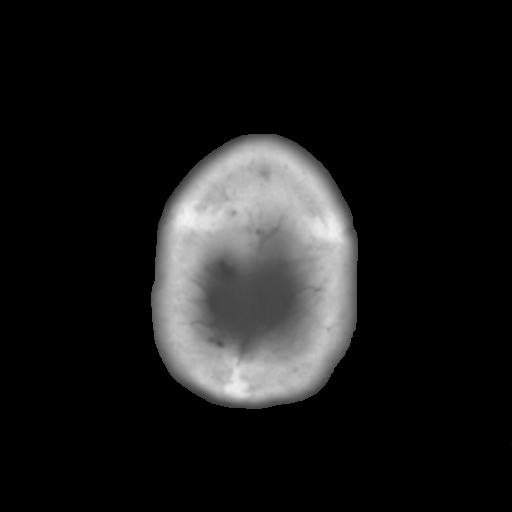

[Series 4: coronal soft tissue · coronal · 0.34mm/px · 3 of 74 slices shown]
[im 25/74  brain]
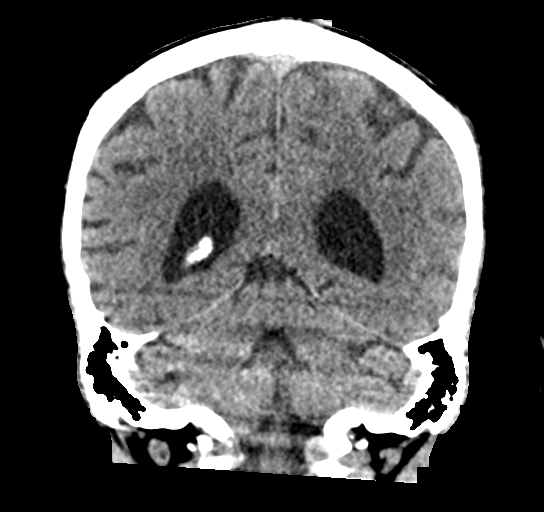
[im 33/74  brain]
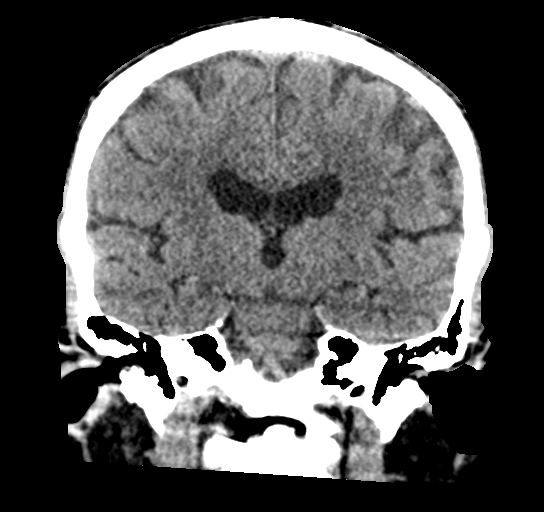
[im 41/74  brain]
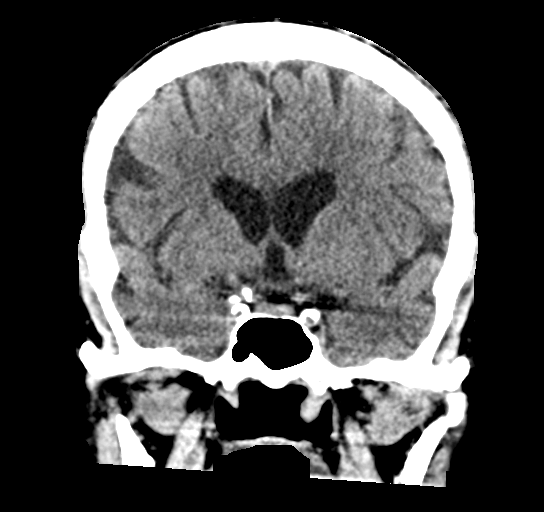

[Series 5: sagittal soft tissue · sagittal · 0.36mm/px · 3 of 56 slices shown]
[im 19/56  brain]
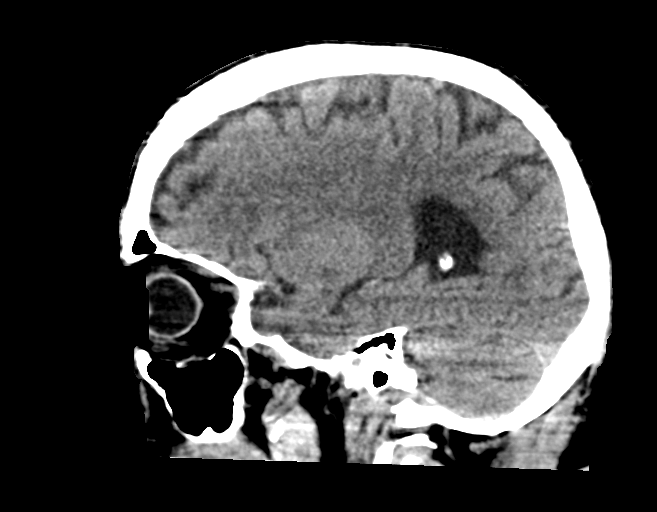
[im 28/56  brain]
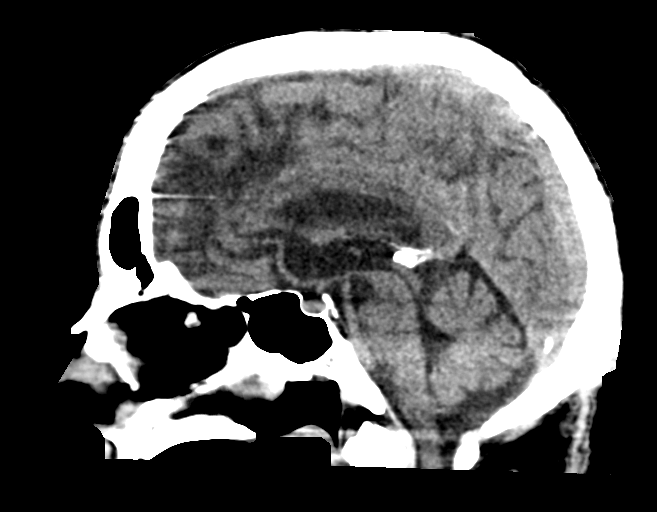
[im 37/56  brain]
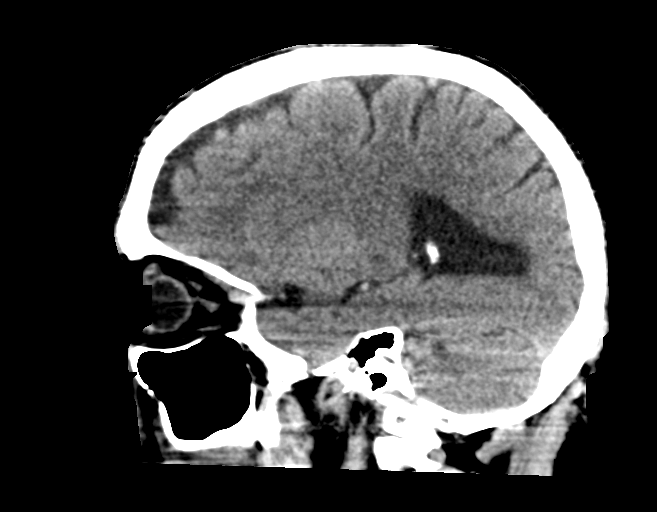

[15 of 47 positions shown; findings below may reference images not displayed]

FINDINGS: Brain: No evidence of acute infarction, hemorrhage, hydrocephalus,
extra-axial collection or mass lesion/mass effect. There is mild
diffuse low-attenuation within the subcortical and periventricular
white matter compatible with chronic microvascular disease.

Vascular: No hyperdense vessel or unexpected calcification.

Skull: Normal. Negative for fracture or focal lesion.

Sinuses/Orbits: No acute finding.

Other: None.
IMPRESSION: 1. No acute intracranial abnormalities.
2. Chronic small vessel ischemic change and brain atrophy.

## 2022-04-02 DIAGNOSIS — R7303 Prediabetes: Secondary | ICD-10-CM | POA: Diagnosis not present

## 2022-04-02 DIAGNOSIS — I4891 Unspecified atrial fibrillation: Secondary | ICD-10-CM | POA: Diagnosis not present

## 2022-04-02 DIAGNOSIS — E871 Hypo-osmolality and hyponatremia: Secondary | ICD-10-CM | POA: Diagnosis not present

## 2022-04-02 DIAGNOSIS — M21371 Foot drop, right foot: Secondary | ICD-10-CM | POA: Diagnosis not present

## 2022-04-02 DIAGNOSIS — G629 Polyneuropathy, unspecified: Secondary | ICD-10-CM | POA: Diagnosis not present

## 2022-04-02 DIAGNOSIS — Z7901 Long term (current) use of anticoagulants: Secondary | ICD-10-CM | POA: Diagnosis not present

## 2022-04-02 DIAGNOSIS — M25562 Pain in left knee: Secondary | ICD-10-CM | POA: Diagnosis not present

## 2022-04-02 DIAGNOSIS — M25561 Pain in right knee: Secondary | ICD-10-CM | POA: Diagnosis not present

## 2022-04-02 DIAGNOSIS — I10 Essential (primary) hypertension: Secondary | ICD-10-CM | POA: Diagnosis not present

## 2022-04-02 DIAGNOSIS — I2510 Atherosclerotic heart disease of native coronary artery without angina pectoris: Secondary | ICD-10-CM | POA: Diagnosis not present

## 2022-04-02 DIAGNOSIS — Z9181 History of falling: Secondary | ICD-10-CM | POA: Diagnosis not present

## 2022-04-02 DIAGNOSIS — G8929 Other chronic pain: Secondary | ICD-10-CM | POA: Diagnosis not present

## 2022-04-02 DIAGNOSIS — I872 Venous insufficiency (chronic) (peripheral): Secondary | ICD-10-CM | POA: Diagnosis not present

## 2022-04-04 ENCOUNTER — Telehealth: Payer: Self-pay

## 2022-04-04 NOTE — Telephone Encounter (Signed)
1 pm.  Phone call made to patient to follow up on overall status and schedule a home visit.  2 attempts made but no answer and VM is not set up.   Will try again at a later time.

## 2022-04-05 DIAGNOSIS — M21371 Foot drop, right foot: Secondary | ICD-10-CM | POA: Diagnosis not present

## 2022-04-05 DIAGNOSIS — G629 Polyneuropathy, unspecified: Secondary | ICD-10-CM | POA: Diagnosis not present

## 2022-04-05 DIAGNOSIS — R7303 Prediabetes: Secondary | ICD-10-CM | POA: Diagnosis not present

## 2022-04-05 DIAGNOSIS — I4891 Unspecified atrial fibrillation: Secondary | ICD-10-CM | POA: Diagnosis not present

## 2022-04-05 DIAGNOSIS — I10 Essential (primary) hypertension: Secondary | ICD-10-CM | POA: Diagnosis not present

## 2022-04-05 DIAGNOSIS — G8929 Other chronic pain: Secondary | ICD-10-CM | POA: Diagnosis not present

## 2022-04-05 DIAGNOSIS — E871 Hypo-osmolality and hyponatremia: Secondary | ICD-10-CM | POA: Diagnosis not present

## 2022-04-05 DIAGNOSIS — Z9181 History of falling: Secondary | ICD-10-CM | POA: Diagnosis not present

## 2022-04-05 DIAGNOSIS — Z7901 Long term (current) use of anticoagulants: Secondary | ICD-10-CM | POA: Diagnosis not present

## 2022-04-05 DIAGNOSIS — I872 Venous insufficiency (chronic) (peripheral): Secondary | ICD-10-CM | POA: Diagnosis not present

## 2022-04-05 DIAGNOSIS — M25562 Pain in left knee: Secondary | ICD-10-CM | POA: Diagnosis not present

## 2022-04-05 DIAGNOSIS — I2510 Atherosclerotic heart disease of native coronary artery without angina pectoris: Secondary | ICD-10-CM | POA: Diagnosis not present

## 2022-04-05 DIAGNOSIS — M25561 Pain in right knee: Secondary | ICD-10-CM | POA: Diagnosis not present

## 2022-04-09 ENCOUNTER — Telehealth: Payer: Self-pay | Admitting: Family Medicine

## 2022-04-09 DIAGNOSIS — I2510 Atherosclerotic heart disease of native coronary artery without angina pectoris: Secondary | ICD-10-CM | POA: Diagnosis not present

## 2022-04-09 DIAGNOSIS — E871 Hypo-osmolality and hyponatremia: Secondary | ICD-10-CM | POA: Diagnosis not present

## 2022-04-09 DIAGNOSIS — Z9181 History of falling: Secondary | ICD-10-CM | POA: Diagnosis not present

## 2022-04-09 DIAGNOSIS — M25562 Pain in left knee: Secondary | ICD-10-CM | POA: Diagnosis not present

## 2022-04-09 DIAGNOSIS — I10 Essential (primary) hypertension: Secondary | ICD-10-CM | POA: Diagnosis not present

## 2022-04-09 DIAGNOSIS — Z7901 Long term (current) use of anticoagulants: Secondary | ICD-10-CM | POA: Diagnosis not present

## 2022-04-09 DIAGNOSIS — I4891 Unspecified atrial fibrillation: Secondary | ICD-10-CM | POA: Diagnosis not present

## 2022-04-09 DIAGNOSIS — I872 Venous insufficiency (chronic) (peripheral): Secondary | ICD-10-CM | POA: Diagnosis not present

## 2022-04-09 DIAGNOSIS — G629 Polyneuropathy, unspecified: Secondary | ICD-10-CM | POA: Diagnosis not present

## 2022-04-09 DIAGNOSIS — M25561 Pain in right knee: Secondary | ICD-10-CM | POA: Diagnosis not present

## 2022-04-09 DIAGNOSIS — R7303 Prediabetes: Secondary | ICD-10-CM | POA: Diagnosis not present

## 2022-04-09 DIAGNOSIS — G8929 Other chronic pain: Secondary | ICD-10-CM | POA: Diagnosis not present

## 2022-04-09 DIAGNOSIS — M21371 Foot drop, right foot: Secondary | ICD-10-CM | POA: Diagnosis not present

## 2022-04-09 NOTE — Telephone Encounter (Signed)
Noted. We could get him scheduled to see me during the next available appointment if he would prefer that. If he declines then he needs to seek medical attention in the ED if he develops chest pain, shortness of breath, or palpitations.

## 2022-04-09 NOTE — Telephone Encounter (Signed)
Noted.  Please reach out to the patient and advise that he needs to be evaluated today given that they noticed an irregular heartbeat.  If there are no available appointments in our office this could be at urgent care or the emergency department.

## 2022-04-09 NOTE — Telephone Encounter (Signed)
I called and spoke with the patient and informed him that his nurse called about his irregular heartbeat and the the provider wants him to be seen and he refused.  He stated he would be alright. I even offered to make an appointment at the urgent care across from this office and he refused.  Liem Copenhaver,cma

## 2022-04-09 NOTE — Telephone Encounter (Signed)
Tabitha from [facility] home health [phone]. She saw patient today. Patient is having a irregular heart rate, she advised patient to go to hospital. Patient did not want to go to the hospital.

## 2022-04-10 NOTE — Telephone Encounter (Signed)
Patient stated he is fine and he does not need to see anyone at this time, I informed him that if he has SOB, chest paint or palpitations to go to the Er and he understood.  Jordan Perkins,cma

## 2022-04-13 DIAGNOSIS — Z9181 History of falling: Secondary | ICD-10-CM | POA: Diagnosis not present

## 2022-04-13 DIAGNOSIS — E871 Hypo-osmolality and hyponatremia: Secondary | ICD-10-CM | POA: Diagnosis not present

## 2022-04-13 DIAGNOSIS — I10 Essential (primary) hypertension: Secondary | ICD-10-CM | POA: Diagnosis not present

## 2022-04-13 DIAGNOSIS — I4891 Unspecified atrial fibrillation: Secondary | ICD-10-CM | POA: Diagnosis not present

## 2022-04-13 DIAGNOSIS — Z7901 Long term (current) use of anticoagulants: Secondary | ICD-10-CM | POA: Diagnosis not present

## 2022-04-13 DIAGNOSIS — I2510 Atherosclerotic heart disease of native coronary artery without angina pectoris: Secondary | ICD-10-CM | POA: Diagnosis not present

## 2022-04-13 DIAGNOSIS — M25561 Pain in right knee: Secondary | ICD-10-CM | POA: Diagnosis not present

## 2022-04-13 DIAGNOSIS — M21371 Foot drop, right foot: Secondary | ICD-10-CM | POA: Diagnosis not present

## 2022-04-13 DIAGNOSIS — R7303 Prediabetes: Secondary | ICD-10-CM | POA: Diagnosis not present

## 2022-04-13 DIAGNOSIS — G629 Polyneuropathy, unspecified: Secondary | ICD-10-CM | POA: Diagnosis not present

## 2022-04-13 DIAGNOSIS — I872 Venous insufficiency (chronic) (peripheral): Secondary | ICD-10-CM | POA: Diagnosis not present

## 2022-04-13 DIAGNOSIS — G8929 Other chronic pain: Secondary | ICD-10-CM | POA: Diagnosis not present

## 2022-04-13 DIAGNOSIS — M25562 Pain in left knee: Secondary | ICD-10-CM | POA: Diagnosis not present

## 2022-04-16 DIAGNOSIS — I4891 Unspecified atrial fibrillation: Secondary | ICD-10-CM | POA: Diagnosis not present

## 2022-04-16 DIAGNOSIS — M25562 Pain in left knee: Secondary | ICD-10-CM | POA: Diagnosis not present

## 2022-04-16 DIAGNOSIS — E871 Hypo-osmolality and hyponatremia: Secondary | ICD-10-CM | POA: Diagnosis not present

## 2022-04-16 DIAGNOSIS — I872 Venous insufficiency (chronic) (peripheral): Secondary | ICD-10-CM | POA: Diagnosis not present

## 2022-04-16 DIAGNOSIS — I2510 Atherosclerotic heart disease of native coronary artery without angina pectoris: Secondary | ICD-10-CM | POA: Diagnosis not present

## 2022-04-16 DIAGNOSIS — G8929 Other chronic pain: Secondary | ICD-10-CM | POA: Diagnosis not present

## 2022-04-16 DIAGNOSIS — Z7901 Long term (current) use of anticoagulants: Secondary | ICD-10-CM | POA: Diagnosis not present

## 2022-04-16 DIAGNOSIS — R7303 Prediabetes: Secondary | ICD-10-CM | POA: Diagnosis not present

## 2022-04-16 DIAGNOSIS — M21371 Foot drop, right foot: Secondary | ICD-10-CM | POA: Diagnosis not present

## 2022-04-16 DIAGNOSIS — I10 Essential (primary) hypertension: Secondary | ICD-10-CM | POA: Diagnosis not present

## 2022-04-16 DIAGNOSIS — Z9181 History of falling: Secondary | ICD-10-CM | POA: Diagnosis not present

## 2022-04-16 DIAGNOSIS — M25561 Pain in right knee: Secondary | ICD-10-CM | POA: Diagnosis not present

## 2022-04-16 DIAGNOSIS — G629 Polyneuropathy, unspecified: Secondary | ICD-10-CM | POA: Diagnosis not present

## 2022-04-27 ENCOUNTER — Telehealth: Payer: Self-pay | Admitting: Cardiovascular Disease

## 2022-04-27 DIAGNOSIS — I4811 Longstanding persistent atrial fibrillation: Secondary | ICD-10-CM

## 2022-04-27 MED ORDER — RIVAROXABAN 20 MG PO TABS: 20.0000 mg | ORAL_TABLET | Freq: Every day | ORAL | 1 refills | Status: DC

## 2022-04-27 MED ORDER — TORSEMIDE 20 MG PO TABS: 20.0000 mg | ORAL_TABLET | Freq: Two times a day (BID) | ORAL | 0 refills | Status: DC

## 2022-04-27 NOTE — Telephone Encounter (Signed)
Xarelto '20mg'$  refill request received. Pt is 81 years old, weight-78kg, Crea-0.93 on 02/13/2022, last seen by Dr. Rockey Situ on 09/25/2021, Diagnosis-Afib, [place].67m/min; Dose is appropriate based on dosing criteria. Will send in refill to requested pharmacy.

## 2022-04-27 NOTE — Telephone Encounter (Signed)
Refill request for Xarelto. Thank you so much

## 2022-04-27 NOTE — Telephone Encounter (Signed)
*  STAT* If patient is at the pharmacy, call can be transferred to refill team.   1. Which medications need to be refilled? (please list name of each medication and dose if known)   XARELTO 20 MG TABS tablet  ferrous sulfate 325 (65 FE) MG tablet  pantoprazole (PROTONIX) 40 MG tablet  torsemide (DEMADEX) 20 MG tablet  2. Which pharmacy/location (including street and city if local pharmacy) is medication to be sent to?  [id number] [place] [facility] [address]  3. Do they need a 30 day or 90 day supply?  90 days  Patient stated he is completely out of this medication.

## 2022-04-27 NOTE — Telephone Encounter (Signed)
Called patient and informed him that I could send in a prescription of torsemide to his preferred pharmacy, but his refill request for Pantoprazole and Ferrous Sulfate would need to be addressed with his primary care provider. Patient verified that he had 4 tablets left of Torsemide. Xarelto request sent to Anticoagulation team for review.

## 2022-05-22 ENCOUNTER — Ambulatory Visit: Payer: Medicare HMO | Admitting: Internal Medicine

## 2022-05-24 ENCOUNTER — Telehealth: Payer: Self-pay | Admitting: Family Medicine

## 2022-05-24 ENCOUNTER — Ambulatory Visit: Payer: Medicare HMO | Admitting: Internal Medicine

## 2022-05-24 ENCOUNTER — Other Ambulatory Visit: Payer: Self-pay

## 2022-05-24 MED ORDER — PANTOPRAZOLE SODIUM 40 MG PO TBEC: 40.0000 mg | DELAYED_RELEASE_TABLET | Freq: Every day | ORAL | 2 refills | Status: DC

## 2022-05-24 MED ORDER — FERROUS SULFATE 325 (65 FE) MG PO TABS: 325.0000 mg | ORAL_TABLET | Freq: Every day | ORAL | 1 refills | Status: DC

## 2022-05-24 NOTE — Telephone Encounter (Signed)
Patient needs the following refills pantoprazole (PROTONIX) 40 MG tablet and ferrous sulfate 325 (65 FE) MG tablet. [facility] [place]

## 2022-05-28 ENCOUNTER — Ambulatory Visit
Admission: RE | Admit: 2022-05-28 | Discharge: 2022-05-28 | Disposition: A | Discharge status: Home / Self Care | Payer: Medicare HMO | Source: Ambulatory Visit | Attending: Family | Admitting: Family

## 2022-05-28 ENCOUNTER — Ambulatory Visit: Payer: Medicare HMO | Attending: Family

## 2022-05-28 ENCOUNTER — Encounter: Payer: Self-pay | Admitting: Family

## 2022-05-28 ENCOUNTER — Ambulatory Visit (INDEPENDENT_AMBULATORY_CARE_PROVIDER_SITE_OTHER): Payer: Medicare HMO | Admitting: Family

## 2022-05-28 ENCOUNTER — Ambulatory Visit
Admission: RE | Admit: 2022-05-28 | Discharge: 2022-05-28 | Disposition: A | Discharge status: Home / Self Care | Payer: Medicare HMO | Attending: Family | Admitting: Family

## 2022-05-28 ENCOUNTER — Telehealth: Payer: Self-pay | Admitting: Family Medicine

## 2022-05-28 VITALS — BP 122/70 | HR 70 | Temp 97.5°F | Ht 71.0 in | Wt 172.3 lb

## 2022-05-28 DIAGNOSIS — I517 Cardiomegaly: Secondary | ICD-10-CM | POA: Diagnosis not present

## 2022-05-28 DIAGNOSIS — Z8739 Personal history of other diseases of the musculoskeletal system and connective tissue: Secondary | ICD-10-CM | POA: Diagnosis not present

## 2022-05-28 DIAGNOSIS — M7989 Other specified soft tissue disorders: Secondary | ICD-10-CM

## 2022-05-28 DIAGNOSIS — R918 Other nonspecific abnormal finding of lung field: Secondary | ICD-10-CM | POA: Diagnosis not present

## 2022-05-28 DIAGNOSIS — I700 Atherosclerosis of aorta: Secondary | ICD-10-CM | POA: Diagnosis not present

## 2022-05-28 MED ORDER — DOXYCYCLINE HYCLATE 100 MG PO TABS: 100.0000 mg | ORAL_TABLET | Freq: Two times a day (BID) | ORAL | 0 refills | Status: AC

## 2022-05-28 NOTE — Telephone Encounter (Signed)
Out patient imaging called about an order being signed for pt

## 2022-05-28 NOTE — Telephone Encounter (Signed)
Verbal was given over the phone

## 2022-05-28 NOTE — Progress Notes (Incomplete)
Subjective:    Patient ID: Jordan Perkins, male    DOB: 1941/07/23, 81 y.o.   MRN: [id number]  CC: Jordan Perkins is a 81 y.o. male who presents today for an acute visit.    HPI: Accompanied with niece  Complains of bilateral leg swelling x 2 weeks  Usually leg swelling will improve with elevation such as when he is sleeping or legs on stool.   These past couple weeks, leg swelling.  Sitting in recliner most of the day No cp, orthopnea,   Pain with walking.  Neuropathy in legs  Lives with brother. Nieces check on both brother ( her uncles)     He is compliant with Xarelto 20 mg QD , Demadex 20 mg BID  History of pulmonary hypertension-compliant with sildenafil '20mg'$  TID  Please asked for referral to podiatry for thick toenails that are difficult to trim.  She also asked for referral to home health as recently completed therapy.  She would like to reinstate home health.    history of anemia,  pulmonary hypertension ,atrial fibrillation, coronary artery disease s/p by pass in 2005, , hypertension, congestive heart failure, PAD, venous insufficiency, neuropathy, chronic kidney disease, prediabetes No history of liver disease Never smoker  He is following with palliative care Follow-up with Dr. Rockey Situ for coronary artery disease 09/25/2021  Echocardiogram 01/27/2021 showed left ventricular ejection fraction 55 to 60%.  Grade 2 diastolic dysfunction.  B12 > 1000 , 02/2022 A1c 5.9 , 02/2022  HISTORY:  Past Medical History:  Diagnosis Date  . (HFpEF) heart failure with preserved ejection fraction ([place])   . CHF (congestive heart failure) ([place])   . Coronary artery disease   . Hyperlipidemia   . Hypertension   . PAH (pulmonary artery hypertension) ([place])   . Permanent atrial fibrillation ([place])   . Pleural effusion    Past Surgical History:  Procedure Laterality Date  . CARDIAC CATHETERIZATION    . CHOLECYSTECTOMY    . COLONOSCOPY WITH PROPOFOL N/A 08/23/2020    Procedure: COLONOSCOPY WITH PROPOFOL;  Surgeon: [name], MD;  Location: [facility] ENDOSCOPY;  Service: Gastroenterology;  Laterality: N/A;  . CORONARY ANGIOPLASTY  06/2004   s/p stent placement @ [facility]  . CORONARY ARTERY BYPASS GRAFT  04-24-2004   CABG x 3 [facility]  . ESOPHAGOGASTRODUODENOSCOPY (EGD) WITH PROPOFOL N/A 08/23/2020   Procedure: ESOPHAGOGASTRODUODENOSCOPY (EGD) WITH PROPOFOL;  Surgeon: [name], MD;  Location: [facility];  Service: Gastroenterology;  Laterality: N/A;  Needs to be last case  . RIGHT HEART CATH Right 06/09/2021   Procedure: RIGHT HEART CATH;  Surgeon: Nelva Bush, MD;  Location: [facility] CV LAB;  Service: Cardiovascular;  Laterality: Right;   Family History  Problem Relation Age of Onset  . Heart disease Father   . Heart disease Paternal Uncle   . Heart attack Brother     Allergies: Sulfa antibiotics Current Outpatient Medications on File Prior to Visit  Medication Sig Dispense Refill  . atorvastatin (LIPITOR) 80 MG tablet TAKE 1 TABLET BY MOUTH EVERY DAY 90 tablet 1  . ferrous sulfate 325 (65 FE) MG tablet Take 1 tablet (325 mg total) by mouth daily. 90 tablet 1  . pantoprazole (PROTONIX) 40 MG tablet Take 1 tablet (40 mg total) by mouth daily. 90 tablet 2  . rivaroxaban (XARELTO) 20 MG TABS tablet Take 1 tablet (20 mg total) by mouth daily with supper. 90 tablet 1  . sildenafil (REVATIO) 20 MG tablet Take 1 tablet (20  mg total) by mouth 3 (three) times daily. 90 tablet 1  . torsemide (DEMADEX) 20 MG tablet Take 1 tablet (20 mg total) by mouth 2 (two) times daily. 180 tablet 0   No current facility-administered medications on file prior to visit.    Social History   Tobacco Use  . Smoking status: Never  . Smokeless tobacco: Never  Vaping Use  . Vaping Use: Never used  Substance Use Topics  . Alcohol use: No  . Drug use: No    Review of Systems    Objective:    BP 122/70 (BP Location: Left Arm, Patient Position: Sitting,  Cuff Size: Normal)   Pulse 70   Temp (!) 97.5 F (36.4 C) (Oral)   Ht '5\' 11"'$  (1.803 m)   Wt 172 lb 4.8 oz (78.2 kg)   SpO2 98%   BMI 24.03 kg/m  Wt Readings from Last 3 Encounters:  05/28/22 172 lb 4.8 oz (78.2 kg)  02/13/22 172 lb (78 kg)  02/08/22 168 lb (76.2 kg)     Physical Exam     Assessment & Plan:      I am having [name] maintain his atorvastatin, sildenafil, torsemide, rivaroxaban, ferrous sulfate, and pantoprazole.   No orders of the defined types were placed in this encounter.   Return precautions given.   Risks, benefits, and alternatives of the medications and treatment plan prescribed today were discussed, and patient expressed understanding.   Education regarding symptom management and diagnosis given to patient on AVS.  Continue to follow with [name], MD for routine health maintenance.   [name] and I agreed with plan.   [name], FNP

## 2022-05-28 NOTE — Patient Instructions (Signed)
Please purchase compression stockings with 20 mmHg and work to elevate your legs while sleeping such as placing pillows above your legs to obtain your legs above the level of your heart.  I placed a referral to vascular surgery and also podiatry  Let us know if you dont hear back within a week in regards to an appointment being scheduled.   Start doxycycline which is an antibiotic for suspected early cellulitis. Ensure to take probiotics while on antibiotics and also for 2 weeks after completion. This can either be by eating yogurt daily or taking a probiotic supplement over the counter such as Culturelle.It is important to re-colonize the gut with good bacteria and also to prevent any diarrheal infections associated with antibiotic use.   Please let me know how you are doing  Edema  Edema is when you have too much fluid in your body or under your skin. Edema may make your legs, feet, and ankles swell. Swelling often happens in looser tissues, such as around your eyes. This is a common condition. It gets more common as you get older. There are many possible causes of edema. These include: Eating too much salt (sodium). Being on your feet or sitting for a long time. Certain medical conditions, such as: Pregnancy. Heart failure. Liver disease. Kidney disease. Cancer. Hot weather may make edema worse. Edema is usually painless. Your skin may look swollen or shiny. Follow these instructions at home: Medicines Take over-the-counter and prescription medicines only as told by your doctor. Your doctor may prescribe a medicine to help your body get rid of extra water (diuretic). Take this medicine if you are told to take it. Eating and drinking Eat a low-salt (low-sodium) diet as told by your doctor. Sometimes, eating less salt may reduce swelling. Depending on the cause of your swelling, you may need to limit how much fluid you drink (fluid restriction). General instructions Raise the injured  area above the level of your heart while you are sitting or lying down. Do not sit still or stand for a long time. Do not wear tight clothes. Do not wear garters on your upper legs. Exercise your legs. This can help the swelling go down. Wear compression stockings as told by your doctor. It is important that these are the right size. These should be prescribed by your doctor to prevent possible injuries. If elastic bandages or wraps are recommended, use them as told by your doctor. Contact a doctor if: Treatment is not working. You have heart, liver, or kidney disease and have symptoms of edema. You have sudden and unexplained weight gain. Get help right away if: You have shortness of breath or chest pain. You cannot breathe when you lie down. You have pain, redness, or warmth in the swollen areas. You have heart, liver, or kidney disease and get edema all of a sudden. You have a fever and your symptoms get worse all of a sudden. These symptoms may be an emergency. Get help right away. Call 911. Do not wait to see if the symptoms will go away. Do not drive yourself to the hospital. Summary Edema is when you have too much fluid in your body or under your skin. Edema may make your legs, feet, and ankles swell. Swelling often happens in looser tissues, such as around your eyes. Raise the injured area above the level of your heart while you are sitting or lying down. Follow your doctor's instructions about diet and how much fluid you can drink. This  information is not intended to replace advice given to you by your health care provider. Make sure you discuss any questions you have with your health care provider. Document Revised: 05/01/2021 Document Reviewed: 05/01/2021 Elsevier Patient Education  [place].

## 2022-05-29 ENCOUNTER — Telehealth: Payer: Self-pay | Admitting: Family

## 2022-05-29 LAB — CBC WITH DIFFERENTIAL/PLATELET
Basophils Absolute: 0.1 10*3/uL (ref 0.0–0.1)
Basophils Relative: 1.4 % (ref 0.0–3.0)
Eosinophils Absolute: 0.1 10*3/uL (ref 0.0–0.7)
Eosinophils Relative: 2.3 % (ref 0.0–5.0)
HCT: 29 % — ABNORMAL LOW (ref 39.0–52.0)
Hemoglobin: 10.1 g/dL — ABNORMAL LOW (ref 13.0–17.0)
Lymphocytes Relative: 11.6 % — ABNORMAL LOW (ref 12.0–46.0)
Lymphs Abs: 0.5 10*3/uL — ABNORMAL LOW (ref 0.7–4.0)
MCHC: 34.8 g/dL (ref 30.0–36.0)
MCV: 103.4 fl — ABNORMAL HIGH (ref 78.0–100.0)
Monocytes Absolute: 0.4 10*3/uL (ref 0.1–1.0)
Monocytes Relative: 9.4 % (ref 3.0–12.0)
Neutro Abs: 3.6 10*3/uL (ref 1.4–7.7)
Neutrophils Relative %: 75.3 % (ref 43.0–77.0)
Platelets: 116 10*3/uL — ABNORMAL LOW (ref 150.0–400.0)
RBC: 2.81 Mil/uL — ABNORMAL LOW (ref 4.22–5.81)
RDW: 15.7 % — ABNORMAL HIGH (ref 11.5–15.5)
WBC: 4.7 10*3/uL (ref 4.0–10.5)

## 2022-05-29 LAB — BRAIN NATRIURETIC PEPTIDE: Brain Natriuretic Peptide: 394 pg/mL — ABNORMAL HIGH (ref <100)

## 2022-05-29 LAB — BASIC METABOLIC PANEL
BUN: 36 mg/dL — ABNORMAL HIGH (ref 6–23)
CO2: 24 mEq/L (ref 19–32)
Calcium: 9.1 mg/dL (ref 8.4–10.5)
Chloride: 99 mEq/L (ref 96–112)
Creatinine, Ser: 1.18 mg/dL (ref 0.40–1.50)
GFR: 58.96 mL/min — ABNORMAL LOW (ref >60.00)
Glucose, Bld: 99 mg/dL (ref 70–99)
Potassium: 4.8 mEq/L (ref 3.5–5.1)
Sodium: 129 mEq/L — ABNORMAL LOW (ref 135–145)

## 2022-05-29 NOTE — Telephone Encounter (Signed)
Jordan Perkins and Jordan Perkins  Pt didn't have leg [place] yesterday I feel like he forgot  Can you call him and this rescheduled asap?

## 2022-06-01 ENCOUNTER — Ambulatory Visit
Admission: RE | Admit: 2022-06-01 | Discharge: 2022-06-01 | Disposition: A | Discharge status: Home / Self Care | Payer: Medicare HMO | Source: Ambulatory Visit | Attending: Family | Admitting: Family

## 2022-06-01 ENCOUNTER — Telehealth: Payer: Self-pay | Admitting: Family

## 2022-06-01 DIAGNOSIS — M7989 Other specified soft tissue disorders: Secondary | ICD-10-CM | POA: Diagnosis not present

## 2022-06-01 DIAGNOSIS — D539 Nutritional anemia, unspecified: Secondary | ICD-10-CM

## 2022-06-01 DIAGNOSIS — R600 Localized edema: Secondary | ICD-10-CM | POA: Diagnosis not present

## 2022-06-01 NOTE — Progress Notes (Signed)
Subjective:    Patient ID: Jordan Perkins, male    DOB: [birth year], 81 y.o.   MRN: [id number]  CC: [name] is a 81 y.o. male who presents today for an acute visit.    HPI: Accompanied with niece   Complains of bilateral leg swelling x 2 weeks, unchanged.    Usually leg swelling will improve with elevation such as when he is sleeping or legs on stool.   These past couple weeks, leg swelling has persisted.   Sitting in recliner most of the day and props his legs on ottoman.   No cp, orthopnea, sob   Endorses calf pain with walking.   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 [place] check on both brothers ( her uncles)      He is compliant with Xarelto 20 mg QD , Demadex 20 mg BID   History of pulmonary hypertension-compliant with sildenafil '20mg'$  TID   He has asked for referral to podiatry for thick toenails that are difficult to trim. He also asks for referral to home health as recently completed therapy.  He would like to reinstate home health as found nursing and PT helpful.      history of anemia,  pulmonary hypertension ,atrial fibrillation, coronary artery disease s/p by pass in 2005, , hypertension, congestive heart failure, PAD, venous insufficiency, neuropathy, chronic kidney disease, prediabetes No history of liver disease Never smoker   He is following with palliative care/ home health.   Follow-up with Dr. Rockey Situ for coronary artery disease 09/25/2021   Echocardiogram 01/27/2021 showed left ventricular ejection fraction 55 to 60%.  Grade 2 diastolic dysfunction.   B12 > 1000 , 02/2022 A1c 5.9 , 02/2022    HISTORY:  Past Medical History:  Diagnosis Date   (HFpEF) heart failure with preserved ejection fraction (HCC)    CHF (congestive heart failure) (HCC)    Coronary artery disease    Hyperlipidemia    Hypertension    PAH (pulmonary artery hypertension) (HCC)    Permanent atrial fibrillation (HCC)    Pleural effusion     Past Surgical History:  Procedure Laterality Date   CARDIAC CATHETERIZATION     CHOLECYSTECTOMY     COLONOSCOPY WITH PROPOFOL N/A 08/23/2020   Procedure: COLONOSCOPY WITH PROPOFOL;  Surgeon: [name], MD;  Location: [facility];  Service: Gastroenterology;  Laterality: N/A;   CORONARY ANGIOPLASTY  06/2004   s/p stent placement @ [facility]   CORONARY ARTERY BYPASS GRAFT  04-24-2004   CABG x 3 [facility]   ESOPHAGOGASTRODUODENOSCOPY (EGD) WITH PROPOFOL N/A 08/23/2020   Procedure: ESOPHAGOGASTRODUODENOSCOPY (EGD) WITH PROPOFOL;  Surgeon: [name], MD;  Location: [place];  Service: Gastroenterology;  Laterality: N/A;  Needs to be last case   RIGHT HEART CATH Right 06/09/2021   Procedure: RIGHT HEART CATH;  Surgeon: Nelva Bush, MD;  Location: [place] [facility];  Service: Cardiovascular;  Laterality: Right;   Family History  Problem Relation Age of Onset   Heart disease Father    Heart disease Paternal Uncle    Heart attack Brother     Allergies: Sulfa antibiotics Current Outpatient Medications on File Prior to Visit  Medication Sig Dispense Refill   atorvastatin (LIPITOR) 80 MG tablet TAKE 1 TABLET BY MOUTH EVERY DAY 90 tablet 1   ferrous sulfate 325 (65 FE) MG tablet Take 1 tablet (325 mg total) by mouth daily. 90 tablet 1   pantoprazole (PROTONIX) 40 MG tablet Take 1 tablet (  40 mg total) by mouth daily. 90 tablet 2   rivaroxaban (XARELTO) 20 MG TABS tablet Take 1 tablet (20 mg total) by mouth daily with supper. 90 tablet 1   sildenafil (REVATIO) 20 MG tablet Take 1 tablet (20 mg total) by mouth 3 (three) times daily. 90 tablet 1   torsemide (DEMADEX) 20 MG tablet Take 1 tablet (20 mg total) by mouth 2 (two) times daily. 180 tablet 0   No current facility-administered medications on file prior to visit.    Social History   Tobacco Use   Smoking status: Never   Smokeless tobacco: Never  Vaping Use   Vaping Use: Never used  Substance Use Topics    Alcohol use: No   Drug use: No    Review of Systems  Constitutional:  Negative for chills and fever.  Respiratory:  Negative for cough and shortness of breath.   Cardiovascular:  Positive for leg swelling. Negative for chest pain and palpitations.  Gastrointestinal:  Negative for nausea and vomiting.  Skin:  Negative for rash and wound.      Objective:    BP 122/70 (BP Location: Left Arm, Patient Position: Sitting, Cuff Size: Normal)   Pulse 70   Temp (!) 97.5 F (36.4 C) (Oral)   Ht '5\' 11"'$  (1.803 m)   Wt 172 lb 4.8 oz (78.2 kg)   SpO2 98%   BMI 24.03 kg/m  This SmartLink has not been configured with any valid records.    Wt Readings from Last 3 Encounters:  05/28/22 172 lb 4.8 oz (78.2 kg)  02/13/22 172 lb (78 kg)  02/08/22 168 lb (76.2 kg)     Physical Exam Vitals reviewed.  Constitutional:      Appearance: He is well-developed.  Cardiovascular:     Rate and Rhythm: Regular rhythm.     Heart sounds: Normal heart sounds.     Comments: BLE edema +1 nonpitting edema. No palpable cords or masses. Slight erythema noted left pretibial. No increased warmth. Slight asymmetry in calf size, L > R.  Bilateral diminished palpable pedal pulses.  Pulmonary:     Effort: Pulmonary effort is normal. No respiratory distress.     Breath sounds: Normal breath sounds. No wheezing, rhonchi or rales.  Skin:    General: Skin is warm and dry.  Neurological:     Mental Status: He is alert.  Psychiatric:        Speech: Speech normal.        Behavior: Behavior normal.        Assessment & Plan:   Problem List Items Addressed This Visit       Other   Leg swelling - Primary    Fortunately, patient is in no acute respiratory distress.  No adventitious lung sounds.  Weight stable. Most recent echocardiogram in 2022 showed ejection fraction 55 to 60%.  Grade 2 diastolic dysfunction. BNP 395  . BUN 36 , Crt 1.18. Na 129 ( prior 129) .  Chest x-ray is stable without pleural effusions.   Scarring is chronic and noted.   he is compliant with demadex 20 BID. Vascularly quite dry. Hesitant to increase demadex in the absence of gross symptoms to suggest CHF exacerbation. Suspect BNP is chronically elevated.   Presentation is more consistent with venous insufficiency,  underlying peripheral artery disease, versus early cellulitis.    Referral has been made to vascular, appointment scheduled 06/14/22.  I had ordered stat bilateral ultrasound to rule out DVT as there is  a subtle increase of left calf when compared to right.  Patient no showed this appointment and working on rescheduling this asap.  Home health referral has been replaced as requested by family.  At this time, more inclined to treat for suspected early cellulitis, conservative measures including counseled patient on how to properly elevate legs, compression stockings.  Close follow-up with appointment scheduled 06/04/22 with me.       Relevant Medications   doxycycline (VIBRA-TABS) 100 MG tablet   Other Relevant Orders   Ambulatory referral to [place]   Ambulatory referral to Vascular Surgery   US Venous Img Lower Bilateral (DVT)   Basic metabolic panel (Completed)   Ambulatory referral to Podiatry   CBC with Differential/Platelet (Completed)   Brain natriuretic peptide (Completed)   DG Chest 2 View (Completed)      I am having [name] start on doxycycline. I am also having him maintain his atorvastatin, sildenafil, torsemide, rivaroxaban, ferrous sulfate, and pantoprazole.   Meds ordered this encounter  Medications   doxycycline (VIBRA-TABS) 100 MG tablet    Sig: Take 1 tablet (100 mg total) by mouth 2 (two) times daily for 7 days.    Dispense:  14 tablet    Refill:  0    Order Specific Question:   Supervising Provider    Answer:   Crecencio Mc [[id number]]    Return precautions given.   Risks, benefits, and alternatives of the medications and treatment plan prescribed today were discussed, and  patient expressed understanding.   Education regarding symptom management and diagnosis given to patient on AVS.  Continue to follow with [name], MD for routine health maintenance.   [place] and I agreed with plan.   [name], FNP

## 2022-06-01 NOTE — Telephone Encounter (Signed)
Spoke with pt  I separately called his niece, Neoma Laming.   Feels well today He started compression stockings yesterday and leg swelling has improved.   Compliant with doxycycline  No sob, orthopnea, purulent discharge of wound.  Advised niece and patient that chest x-ray and labs were stable. Will review with them again on Monday.    Plan:  Advised patient uncomfortable and waiting for ultrasound to be scheduled on Monday.  Advised her again that I want to rule out a blood clot as left calf is larger than the right.  She verbalized understanding and she states she will call and have it scheduled today. Continue compression stockings, elevation Labs to further evaluate anemia on Monday  Reviewed consult to Dr. Marius Ditch 08/08/2020 review for occult blood test positive, history of cirrhosis, normocytic anemia.  Colonoscopy which showed polyp and nonbleeding external hemorrhoids, endoscopy with normal stomach 08/23/2020  No follow up with Dr Marius Ditch

## 2022-06-01 NOTE — Assessment & Plan Note (Addendum)
Fortunately, patient is in no acute respiratory distress.  No adventitious lung sounds.  Weight stable. Most recent echocardiogram in 2022 showed ejection fraction 55 to 60%.  Grade 2 diastolic dysfunction. BNP 395  . BUN 36 , Crt 1.18. Na 129 ( prior 129) .  Chest x-ray is stable without pleural effusions.  Scarring is chronic and noted.   he is compliant with demadex 20 BID. Vascularly quite dry. Hesitant to increase demadex in the absence of gross symptoms to suggest CHF exacerbation. Suspect BNP is chronically elevated.   Presentation is more consistent with venous insufficiency,  underlying peripheral artery disease, versus early cellulitis.    Referral has been made to vascular, appointment scheduled 06/14/22.  I had ordered stat bilateral ultrasound to rule out DVT as there is a subtle increase of left calf when compared to right.  Patient no showed this appointment and working on rescheduling this asap.  Home health referral has been replaced as requested by family.  At this time, more inclined to treat for suspected early cellulitis, conservative measures including counseled patient on how to properly elevate legs, compression stockings.  Close follow-up with appointment scheduled 06/04/22 with me.

## 2022-06-01 NOTE — Progress Notes (Unsigned)
Subjective:    Patient ID: Jordan Perkins, male    DOB: 1941-05-07, 81 y.o.   MRN: [id number]  CC: Jordan Perkins is a 81 y.o. male who presents today for follow up.   HPI: Labs to further evaluate anemia on Monday  Reviewed consult to Dr. Marius Ditch 08/08/2020 review for occult blood test positive, history of cirrhosis, normocytic anemia.  Colonoscopy which showed polyp and nonbleeding external hemorrhoids, endoscopy with normal stomach 08/23/2020  No follow up with Dr Marius Ditch     HISTORY:  Past Medical History:  Diagnosis Date   (HFpEF) heart failure with preserved ejection fraction (HCC)    CHF (congestive heart failure) (HCC)    Coronary artery disease    Hyperlipidemia    Hypertension    PAH (pulmonary artery hypertension) (HCC)    Permanent atrial fibrillation (HCC)    Pleural effusion    Past Surgical History:  Procedure Laterality Date   CARDIAC CATHETERIZATION     CHOLECYSTECTOMY     COLONOSCOPY WITH PROPOFOL N/A 08/23/2020   Procedure: COLONOSCOPY WITH PROPOFOL;  Surgeon: [name], MD;  Location: [facility];  Service: Gastroenterology;  Laterality: N/A;   CORONARY ANGIOPLASTY  06/2004   s/p stent placement @ [facility]   CORONARY ARTERY BYPASS GRAFT  04-24-2004   CABG x 3 [facility]   ESOPHAGOGASTRODUODENOSCOPY (EGD) WITH PROPOFOL N/A 08/23/2020   Procedure: ESOPHAGOGASTRODUODENOSCOPY (EGD) WITH PROPOFOL;  Surgeon: [name], MD;  Location: [place];  Service: Gastroenterology;  Laterality: N/A;  Needs to be last case   RIGHT HEART CATH Right 06/09/2021   Procedure: RIGHT HEART CATH;  Surgeon: Nelva Bush, MD;  Location: [place] [facility];  Service: Cardiovascular;  Laterality: Right;   Family History  Problem Relation Age of Onset   Heart disease Father    Heart disease Paternal Uncle    Heart attack Brother     Allergies: Sulfa antibiotics Current Outpatient Medications on File Prior to Visit  Medication Sig Dispense Refill    atorvastatin (LIPITOR) 80 MG tablet TAKE 1 TABLET BY MOUTH EVERY DAY 90 tablet 1   doxycycline (VIBRA-TABS) 100 MG tablet Take 1 tablet (100 mg total) by mouth 2 (two) times daily for 7 days. 14 tablet 0   ferrous sulfate 325 (65 FE) MG tablet Take 1 tablet (325 mg total) by mouth daily. 90 tablet 1   pantoprazole (PROTONIX) 40 MG tablet Take 1 tablet (40 mg total) by mouth daily. 90 tablet 2   rivaroxaban (XARELTO) 20 MG TABS tablet Take 1 tablet (20 mg total) by mouth daily with supper. 90 tablet 1   sildenafil (REVATIO) 20 MG tablet Take 1 tablet (20 mg total) by mouth 3 (three) times daily. 90 tablet 1   torsemide (DEMADEX) 20 MG tablet Take 1 tablet (20 mg total) by mouth 2 (two) times daily. 180 tablet 0   No current facility-administered medications on file prior to visit.    Social History   Tobacco Use   Smoking status: Never   Smokeless tobacco: Never  Vaping Use   Vaping Use: Never used  Substance Use Topics   Alcohol use: No   Drug use: No    Review of Systems    Objective:    There were no vitals taken for this visit. BP Readings from Last 3 Encounters:  05/28/22 122/70  02/13/22 140/60  02/08/22 (!) 148/60   Wt Readings from Last 3 Encounters:  05/28/22 172 lb 4.8 oz (78.2 kg)  02/13/22  172 lb (78 kg)  02/08/22 168 lb (76.2 kg)    Physical Exam     Assessment & Plan:   Problem List Items Addressed This Visit   None    I am having Jordan Perkins maintain his atorvastatin, sildenafil, torsemide, rivaroxaban, ferrous sulfate, pantoprazole, and doxycycline.   No orders of the defined types were placed in this encounter.   Return precautions given.   Risks, benefits, and alternatives of the medications and treatment plan prescribed today were discussed, and patient expressed understanding.   Education regarding symptom management and diagnosis given to patient on AVS.  Continue to follow with [name], MD for routine health  maintenance.   [place] and I agreed with plan.   [name], FNP

## 2022-06-04 ENCOUNTER — Telehealth: Payer: Self-pay | Admitting: Family

## 2022-06-04 ENCOUNTER — Encounter: Payer: Self-pay | Admitting: Family

## 2022-06-04 ENCOUNTER — Other Ambulatory Visit: Payer: Self-pay | Admitting: Family

## 2022-06-04 ENCOUNTER — Emergency Department: Payer: Medicare HMO

## 2022-06-04 ENCOUNTER — Ambulatory Visit: Payer: Medicare HMO

## 2022-06-04 ENCOUNTER — Other Ambulatory Visit: Payer: Self-pay

## 2022-06-04 ENCOUNTER — Ambulatory Visit (INDEPENDENT_AMBULATORY_CARE_PROVIDER_SITE_OTHER): Payer: Medicare HMO | Admitting: Family

## 2022-06-04 ENCOUNTER — Emergency Department
Admission: EM | Admit: 2022-06-04 | Discharge: 2022-06-04 | Disposition: A | Discharge status: Home / Self Care | Payer: Medicare HMO | Attending: Emergency Medicine | Admitting: Emergency Medicine

## 2022-06-04 VITALS — BP 144/80 | HR 64 | Temp 98.0°F | Ht 71.0 in | Wt 172.0 lb

## 2022-06-04 DIAGNOSIS — K5900 Constipation, unspecified: Secondary | ICD-10-CM | POA: Diagnosis not present

## 2022-06-04 DIAGNOSIS — D539 Nutritional anemia, unspecified: Secondary | ICD-10-CM

## 2022-06-04 DIAGNOSIS — R2243 Localized swelling, mass and lump, lower limb, bilateral: Secondary | ICD-10-CM | POA: Insufficient documentation

## 2022-06-04 DIAGNOSIS — R079 Chest pain, unspecified: Secondary | ICD-10-CM | POA: Diagnosis not present

## 2022-06-04 DIAGNOSIS — M7989 Other specified soft tissue disorders: Secondary | ICD-10-CM

## 2022-06-04 DIAGNOSIS — I509 Heart failure, unspecified: Secondary | ICD-10-CM | POA: Insufficient documentation

## 2022-06-04 DIAGNOSIS — Z5321 Procedure and treatment not carried out due to patient leaving prior to being seen by health care provider: Secondary | ICD-10-CM | POA: Insufficient documentation

## 2022-06-04 DIAGNOSIS — J90 Pleural effusion, not elsewhere classified: Secondary | ICD-10-CM | POA: Diagnosis not present

## 2022-06-04 DIAGNOSIS — M79606 Pain in leg, unspecified: Secondary | ICD-10-CM | POA: Insufficient documentation

## 2022-06-04 LAB — BASIC METABOLIC PANEL
Anion gap: 7 (ref 5–15)
BUN: 49 mg/dL — ABNORMAL HIGH (ref 8–23)
CO2: 26 mmol/L (ref 22–32)
Calcium: 9.2 mg/dL (ref 8.9–10.3)
Chloride: 101 mmol/L (ref 98–111)
Creatinine, Ser: 1.42 mg/dL — ABNORMAL HIGH (ref 0.61–1.24)
GFR, Estimated: 51 mL/min — ABNORMAL LOW (ref >60)
Glucose, Bld: 102 mg/dL — ABNORMAL HIGH (ref 70–99)
Potassium: 4.3 mmol/L (ref 3.5–5.1)
Sodium: 134 mmol/L — ABNORMAL LOW (ref 135–145)

## 2022-06-04 LAB — CBC
HCT: 31.4 % — ABNORMAL LOW (ref 39.0–52.0)
Hemoglobin: 10.6 g/dL — ABNORMAL LOW (ref 13.0–17.0)
MCH: 34.3 pg — ABNORMAL HIGH (ref 26.0–34.0)
MCHC: 33.8 g/dL (ref 30.0–36.0)
MCV: 101.6 fL — ABNORMAL HIGH (ref 80.0–100.0)
Platelets: 122 10*3/uL — ABNORMAL LOW (ref 150–400)
RBC: 3.09 MIL/uL — ABNORMAL LOW (ref 4.22–5.81)
RDW: 14.5 % (ref 11.5–15.5)
WBC: 5.8 10*3/uL (ref 4.0–10.5)
nRBC: 0 % (ref 0.0–0.2)

## 2022-06-04 LAB — TROPONIN I (HIGH SENSITIVITY): Troponin I (High Sensitivity): 24 ng/L — ABNORMAL HIGH (ref <18)

## 2022-06-04 LAB — BRAIN NATRIURETIC PEPTIDE: B Natriuretic Peptide: 459.8 pg/mL — ABNORMAL HIGH (ref 0.0–100.0)

## 2022-06-04 MED ORDER — TORSEMIDE 40 MG PO TABS: 40.0000 mg | ORAL_TABLET | Freq: Two times a day (BID) | ORAL | 1 refills | Status: DC

## 2022-06-04 MED ORDER — TORSEMIDE 40 MG PO TABS: 20.0000 mg | ORAL_TABLET | Freq: Two times a day (BID) | ORAL | 1 refills | Status: DC

## 2022-06-04 MED ORDER — TORSEMIDE 20 MG PO TABS: 40.0000 mg | ORAL_TABLET | Freq: Two times a day (BID) | ORAL | 1 refills | Status: DC

## 2022-06-04 NOTE — ED Notes (Signed)
Daughter to STAT desk stating that they would be leaving, stating that the home health nurse is on the way to the home.

## 2022-06-04 NOTE — Telephone Encounter (Signed)
Spoke with pharmacy they said 40 mg tablet was only available in brand name and it would have cost pt a fortune , I have sent in the 20 mg twice daily with 120 tablets with 1 refill

## 2022-06-04 NOTE — Patient Instructions (Addendum)
Please go directed to [facility] emergency room as discussed

## 2022-06-04 NOTE — Assessment & Plan Note (Addendum)
Acute on chronic.  Afebrile.  No tenderness on exam suspect aggravated by ferrous sulfate 25 mg taken daily.  Question if abdominal distention related to ascites.  He has a history of ascites, anasarca previous hospitalization in 2021.CT abdomen pelvis 03/23/2020 showed ascites, nodular contour liver, suggestive of cirrhosis.    recommended patient would likely need abdominal imaging for further evaluation at [facility] ED.

## 2022-06-04 NOTE — Telephone Encounter (Signed)
Called and spoke with patient and Jordan Perkins (niece) regarding Dr Vennie Homans advice via secure chat.  They are agreeable to increasing torsemide to 40 mg twice daily.  Labs in 5 days.  Niece will take him to [facility] lab

## 2022-06-04 NOTE — Telephone Encounter (Signed)
Noted I sent in torsemide '20mg'$  to be taken '40mg'$ 

## 2022-06-04 NOTE — Telephone Encounter (Signed)
Call pt  Ensure pt is aware. I also sent mychart  [facility]  Cardiology has  made him appt with Christell Faith, PA  Keep appt with Dr Rockey Situ for October  06/07/22 @ 10;55am with Christell Faith, PA

## 2022-06-04 NOTE — Progress Notes (Signed)
close

## 2022-06-04 NOTE — Assessment & Plan Note (Addendum)
Hemoglobin 10.1 ( previous 11.1)  MCV 103.4. Discussed long standing anemia with patient and niece today. We agreed consult to hematology appropriate.

## 2022-06-04 NOTE — ED Triage Notes (Signed)
First Nurse Note:  Pt sent from PCP provider for possible CHF exab. States that he has had bilateral leg swelling and abd swelling.   VSS 144/80, 64 HR, 99% on RA

## 2022-06-04 NOTE — ED Triage Notes (Signed)
Pt  arrives with bilateral leg swelling that has been getting worse over the last few weeks. Pt c/o pain in his legs and constipation. Pt ABD is swollen. Pt has hx of HF.

## 2022-06-04 NOTE — Assessment & Plan Note (Addendum)
Weight stable. Denies SOB. Compliant with demadex '20mg'$  bid without relief. Na 129. GFR 58. BUN 36. BNP 394.In the setting entire leg swelling, abdominal distention, concerned for BNP elevation reflective of disease severity of CHF, h/o cirrhosis. He is also unstable in his gait and lives with his brother. Despite excellent family care daily checking on him, I am concerned that patient is at high risk of falls and would also benefit hospital admission for diuresis,electrolyte monitoring, PT consult.  I had previously referred him to home health and have checked on status with our referral coordinator.  Patient and niece agreeable to go to [facility] by private vehicle today.  I have given report to nurse triage.  Will follow.

## 2022-06-04 NOTE — Telephone Encounter (Signed)
Call pt pharmacy  I got an alert about torsemide 40 mg.  Do they not have torsemide 40 mg tablet?  If not, you may prescribe for torsemide 20 mg to be taken 2 tablets ('40mg'$ ) twice a day, 120 tablets in a bottle, 1 refill

## 2022-06-05 DIAGNOSIS — G629 Polyneuropathy, unspecified: Secondary | ICD-10-CM | POA: Diagnosis not present

## 2022-06-05 DIAGNOSIS — R7303 Prediabetes: Secondary | ICD-10-CM | POA: Diagnosis not present

## 2022-06-05 DIAGNOSIS — Z9181 History of falling: Secondary | ICD-10-CM | POA: Diagnosis not present

## 2022-06-05 DIAGNOSIS — Z7901 Long term (current) use of anticoagulants: Secondary | ICD-10-CM | POA: Diagnosis not present

## 2022-06-05 DIAGNOSIS — L0390 Cellulitis, unspecified: Secondary | ICD-10-CM | POA: Diagnosis not present

## 2022-06-05 DIAGNOSIS — I10 Essential (primary) hypertension: Secondary | ICD-10-CM | POA: Diagnosis not present

## 2022-06-05 DIAGNOSIS — Z955 Presence of coronary angioplasty implant and graft: Secondary | ICD-10-CM | POA: Diagnosis not present

## 2022-06-05 DIAGNOSIS — I739 Peripheral vascular disease, unspecified: Secondary | ICD-10-CM | POA: Diagnosis not present

## 2022-06-05 DIAGNOSIS — D631 Anemia in chronic kidney disease: Secondary | ICD-10-CM | POA: Diagnosis not present

## 2022-06-05 DIAGNOSIS — I5030 Unspecified diastolic (congestive) heart failure: Secondary | ICD-10-CM | POA: Diagnosis not present

## 2022-06-05 DIAGNOSIS — I4821 Permanent atrial fibrillation: Secondary | ICD-10-CM | POA: Diagnosis not present

## 2022-06-05 DIAGNOSIS — I130 Hypertensive heart and chronic kidney disease with heart failure and stage 1 through stage 4 chronic kidney disease, or unspecified chronic kidney disease: Secondary | ICD-10-CM | POA: Diagnosis not present

## 2022-06-05 DIAGNOSIS — N189 Chronic kidney disease, unspecified: Secondary | ICD-10-CM | POA: Diagnosis not present

## 2022-06-05 DIAGNOSIS — I2510 Atherosclerotic heart disease of native coronary artery without angina pectoris: Secondary | ICD-10-CM | POA: Diagnosis not present

## 2022-06-05 DIAGNOSIS — I872 Venous insufficiency (chronic) (peripheral): Secondary | ICD-10-CM | POA: Diagnosis not present

## 2022-06-05 DIAGNOSIS — I272 Pulmonary hypertension, unspecified: Secondary | ICD-10-CM | POA: Diagnosis not present

## 2022-06-05 DIAGNOSIS — E785 Hyperlipidemia, unspecified: Secondary | ICD-10-CM | POA: Diagnosis not present

## 2022-06-05 NOTE — Telephone Encounter (Signed)
FYI  Leg US performed 06/01/22

## 2022-06-06 ENCOUNTER — Telehealth: Payer: Self-pay | Admitting: Family

## 2022-06-06 DIAGNOSIS — I739 Peripheral vascular disease, unspecified: Secondary | ICD-10-CM | POA: Diagnosis not present

## 2022-06-06 DIAGNOSIS — N189 Chronic kidney disease, unspecified: Secondary | ICD-10-CM | POA: Diagnosis not present

## 2022-06-06 DIAGNOSIS — I2510 Atherosclerotic heart disease of native coronary artery without angina pectoris: Secondary | ICD-10-CM | POA: Diagnosis not present

## 2022-06-06 DIAGNOSIS — L0390 Cellulitis, unspecified: Secondary | ICD-10-CM | POA: Diagnosis not present

## 2022-06-06 DIAGNOSIS — D631 Anemia in chronic kidney disease: Secondary | ICD-10-CM | POA: Diagnosis not present

## 2022-06-06 DIAGNOSIS — I272 Pulmonary hypertension, unspecified: Secondary | ICD-10-CM | POA: Diagnosis not present

## 2022-06-06 DIAGNOSIS — E785 Hyperlipidemia, unspecified: Secondary | ICD-10-CM | POA: Diagnosis not present

## 2022-06-06 DIAGNOSIS — Z955 Presence of coronary angioplasty implant and graft: Secondary | ICD-10-CM | POA: Diagnosis not present

## 2022-06-06 DIAGNOSIS — Z7901 Long term (current) use of anticoagulants: Secondary | ICD-10-CM | POA: Diagnosis not present

## 2022-06-06 DIAGNOSIS — R7303 Prediabetes: Secondary | ICD-10-CM | POA: Diagnosis not present

## 2022-06-06 DIAGNOSIS — I872 Venous insufficiency (chronic) (peripheral): Secondary | ICD-10-CM | POA: Diagnosis not present

## 2022-06-06 DIAGNOSIS — Z9181 History of falling: Secondary | ICD-10-CM | POA: Diagnosis not present

## 2022-06-06 DIAGNOSIS — I5030 Unspecified diastolic (congestive) heart failure: Secondary | ICD-10-CM | POA: Diagnosis not present

## 2022-06-06 DIAGNOSIS — G629 Polyneuropathy, unspecified: Secondary | ICD-10-CM | POA: Diagnosis not present

## 2022-06-06 DIAGNOSIS — I4821 Permanent atrial fibrillation: Secondary | ICD-10-CM | POA: Diagnosis not present

## 2022-06-06 DIAGNOSIS — I130 Hypertensive heart and chronic kidney disease with heart failure and stage 1 through stage 4 chronic kidney disease, or unspecified chronic kidney disease: Secondary | ICD-10-CM | POA: Diagnosis not present

## 2022-06-06 NOTE — Telephone Encounter (Signed)
noted 

## 2022-06-06 NOTE — Telephone Encounter (Signed)
Spoke to patient and he stated that he has no sob and his legs have gone down a little bit not much but are a little better and he has appt with Cardiology on 06/11/22

## 2022-06-06 NOTE — Telephone Encounter (Signed)
Call pt  How is he feeling today? Please confirm no sob, cp  Has leg swelling responded to increase of torsemide '40mg'$  BID?

## 2022-06-07 ENCOUNTER — Ambulatory Visit: Payer: Medicare HMO | Admitting: Physician Assistant

## 2022-06-07 NOTE — Telephone Encounter (Signed)
SEE PREVIOUS NOTE

## 2022-06-11 ENCOUNTER — Ambulatory Visit: Payer: Medicare HMO | Attending: Physician Assistant | Admitting: Cardiovascular Disease

## 2022-06-11 ENCOUNTER — Encounter: Payer: Self-pay | Admitting: Cardiovascular Disease

## 2022-06-11 VITALS — BP 140/60 | HR 66 | Ht 71.0 in | Wt 172.0 lb

## 2022-06-11 DIAGNOSIS — I739 Peripheral vascular disease, unspecified: Secondary | ICD-10-CM

## 2022-06-11 DIAGNOSIS — I779 Disorder of arteries and arterioles, unspecified: Secondary | ICD-10-CM

## 2022-06-11 DIAGNOSIS — E782 Mixed hyperlipidemia: Secondary | ICD-10-CM | POA: Diagnosis not present

## 2022-06-11 DIAGNOSIS — I25118 Atherosclerotic heart disease of native coronary artery with other forms of angina pectoris: Secondary | ICD-10-CM

## 2022-06-11 DIAGNOSIS — I272 Pulmonary hypertension, unspecified: Secondary | ICD-10-CM

## 2022-06-11 DIAGNOSIS — I5032 Chronic diastolic (congestive) heart failure: Secondary | ICD-10-CM

## 2022-06-11 DIAGNOSIS — I10 Essential (primary) hypertension: Secondary | ICD-10-CM | POA: Diagnosis not present

## 2022-06-11 DIAGNOSIS — I4811 Longstanding persistent atrial fibrillation: Secondary | ICD-10-CM | POA: Diagnosis not present

## 2022-06-11 NOTE — Progress Notes (Signed)
Date:  06/11/2022   ID:  Jordan Perkins, DOB May 31, 1941, MRN [id number]  Patient Location:  [place] [id number]   Provider location:   [facility], [place] office  PCP:  [name], MD  Cardiologist:  [facility]  Chief Complaint  Patient presents with   Leg Swelling    Patient c/o shortness of breath and bilateral LE edema. Medications reviewed by the patient's niece [name])     History of Present Illness:    Jordan Perkins is a 81 y.o. male  past medical history of CAD,  bypass surgery, 2005 at [facility] stents in 2005,  hypertension,  hyperlipidemia,  Chronic atrial fibrillation , on xarelto hospital with shortness of breath and chest pain, rapid atrial fibrillation.  Echocardiogram showed ejection fraction 60-65% Carotid 07/2016: 40 to 59% stenosis on left, <39% on the right Pulmonary HTN, right heart pressures estimated 100 on echocardiogram Chronic anemia Who presents for follow up of his CAD and atrial fibrillation, pulmonary HTN (estimated right heart pressures on echo of 100 mmHg on recent echo May 2022 and echo July 2021)  Last seen by myself in clinic January 2023 Seen in the emergency room June 04, 2022 Did not stay for complete evaluation At that time reported leg swelling, abdominal swelling Over the telephone was told to increase torsemide up to 40 twice daily  Presents today with his niece Still with significant leg edema though she reports abdomen less distended, lower extremity edema below the knees has improved but still significant Reports drinking several bottles of water a day Not very active at home secondary to leg swelling and immobility Today in a wheelchair  Does not eat out at restaurants very much Has been a nurse  EKG personally reviewed by myself on todays visit Atrial fibrillation ventricular rate 66 bpm  Prior procedure data reviewed RHC performed June 09, 2021 Severe pulmonary hypertension (mean PAP 63 mmHg, PVR 5.9 WU). Moderately elevated left and right heart filling pressures (RAP 15 mmHg, PCWP 30 mmHg). Normal Fick cardiac output/index.  In follow-up today he is unsure if he is taking torsemide once a day or twice a day Denies significant leg swelling, no shortness of breath Thinks he is on sildenafil, he is unsure, possibly twice a day Previously declined referral to pulmonary hypertension clinic in [place], unable to drive Lives with his brother  Reports walking limited by arthritides  EKG personally reviewed by myself on todays visit Atrial fibrillation rate 70 bpm no significant ST-T wave changes  Other past medical history reviewed In the hospital 04/2021, weak, sodium low, 120 Given IVF, sodium improved by increasing his oral salt intake Was discharged back on his torsemide Now sodium 139  History of chronic anemia, Last hemoglobin 9.9 , chronic  Other lab work reviewed Creatinine 1.08, BUN 27  Echo 01/2021  Right heart pressures estimated 100 mmHg on echo  1. Left ventricular ejection fraction, by estimation, is 55 to 60%. The  left ventricle has normal function. The left ventricle has no regional  wall motion abnormalities. Left ventricular diastolic parameters are  consistent with Grade II diastolic  dysfunction (pseudonormalization).   2. Right ventricular systolic function is mildly reduced. The right  ventricular size is moderately enlarged.   3. Left atrial size was severely dilated.   4. Right atrial size was moderately dilated.   5. The mitral valve is degenerative. Mild mitral valve regurgitation.   6. The  aortic valve is tricuspid. Aortic valve regurgitation is not  visualized. Mild aortic valve sclerosis is present, with no evidence of  aortic valve stenosis.   7. The inferior vena cava is dilated in size with <50% respiratory  variability, suggesting right atrial pressure of 15 mmHg.    Echocardiogram July 2021 ejection fraction 60 to 65% Severely elevated right heart pressures  Zio: Atrial Fibrillation occurred continuously (100% burden), ranging from 32-104 bpm (avg of 56 bpm). 2 Ventricular Tachycardia runs occurred, the run with the fastest interval lasting 7 beats with a max rate of 171 bpm (avg 117 bpm);  the run with the fastest interval was also the longest.  Isolated VEs were occasional (2.6%, 28107), VE Couplets were rare (<1.0%, 393), and VE Triplets were rare (<1.0%, 14). Ventricular Bigeminy and Trigeminy were present.  Hospital admission 10/2016 Hyponatremia, weakness, elevated T.Bili (gilberts)   hospital admission in 2014, noted to have gallstones.   negative Hida scan. Now s/p  Cholecystectomy 11/14   CT scan in the hospital of his abdomen and pelvis showed acute pancreatitis with increased fluid density in the peri-pancreatic fat, multiple gallstones, small hiatal hernia   Past Medical History:  Diagnosis Date   (HFpEF) heart failure with preserved ejection fraction (HCC)    CHF (congestive heart failure) (HCC)    Coronary artery disease    Hyperlipidemia    Hypertension    PAH (pulmonary artery hypertension) (HCC)    Permanent atrial fibrillation (HCC)    Pleural effusion    Past Surgical History:  Procedure Laterality Date   CARDIAC CATHETERIZATION     CHOLECYSTECTOMY     COLONOSCOPY WITH PROPOFOL N/A 08/23/2020   Procedure: COLONOSCOPY WITH PROPOFOL;  Surgeon: [name], MD;  Location: [facility] ENDOSCOPY;  Service: Gastroenterology;  Laterality: N/A;   CORONARY ANGIOPLASTY  06/2004   s/p stent placement @ [facility]   CORONARY ARTERY BYPASS GRAFT  04-24-2004   CABG x 3 [facility]   ESOPHAGOGASTRODUODENOSCOPY (EGD) WITH PROPOFOL N/A 08/23/2020   Procedure: ESOPHAGOGASTRODUODENOSCOPY (EGD) WITH PROPOFOL;  Surgeon: [name], MD;  Location: [place];  Service: Gastroenterology;  Laterality: N/A;  Needs to be last case   RIGHT  HEART CATH Right 06/09/2021   Procedure: RIGHT HEART CATH;  Surgeon: Nelva Bush, MD;  Location: [place] [facility];  Service: Cardiovascular;  Laterality: Right;     No outpatient medications have been marked as taking for the 06/11/22 encounter (Office Visit) with [name], MD.     Allergies:   Sulfa antibiotics   Social History   Tobacco Use   Smoking status: Never   Smokeless tobacco: Never  Vaping Use   Vaping Use: Never used  Substance Use Topics   Alcohol use: No   Drug use: No     Family Hx: The patient's family history includes Heart attack in his brother; Heart disease in his father and paternal uncle.  ROS:   Review of Systems  Constitutional: Negative.   HENT: Negative.    Respiratory: Negative.    Cardiovascular: Negative.   Gastrointestinal: Negative.   Musculoskeletal: Negative.   Neurological: Negative.   Psychiatric/Behavioral: Negative.    All other systems reviewed and are negative.    Labs/Other Tests and Data Reviewed:    Recent Labs: 06/04/2022: B Natriuretic Peptide 459.8; BUN 49; Creatinine, Ser 1.42; Hemoglobin 10.6; Platelets 122; Potassium 4.3; Sodium 134   Recent Lipid Panel Lab Results  Component Value Date/Time   CHOL 113 04/21/2021 01:33 PM  CHOL 89 (L) 04/06/2015 04:42 PM   CHOL 150 06/06/2013 09:47 AM   TRIG 46.0 04/21/2021 01:33 PM   TRIG 85 06/06/2013 09:47 AM   HDL 58.70 04/21/2021 01:33 PM   HDL 40 04/06/2015 04:42 PM   HDL 43 06/06/2013 09:47 AM   CHOLHDL 2 04/21/2021 01:33 PM   LDLCALC 45 04/21/2021 01:33 PM   LDLCALC 36 04/06/2015 04:42 PM   LDLCALC 90 06/06/2013 09:47 AM    Wt Readings from Last 3 Encounters:  06/11/22 172 lb (78 kg)  06/04/22 172 lb (78 kg)  06/04/22 172 lb (78 kg)     Exam:    BP (!) 140/60 (BP Location: Left Arm, Patient Position: Sitting, Cuff Size: Normal)   Pulse 66   Ht '5\' 11"'$  (1.803 m)   Wt 172 lb (78 kg) Comment: unable to stand-- weight 06/04/22  BMI 23.99 kg/m   Constitutional:  oriented to person, place, and time. No distress.  HENT:  Head: Grossly normal Eyes:  no discharge. No scleral icterus.  Neck: No JVD, no carotid bruits  Cardiovascular: Irregularly irregular,  no murmurs appreciated Pulmonary/Chest: Clear to auscultation bilaterally, no wheezes or rails Abdominal: Soft.  no distension.  no tenderness.  Musculoskeletal: Normal range of motion Neurological:  normal muscle tone. Coordination normal. No atrophy Skin: Skin warm and dry Psychiatric: normal affect, pleasant  ASSESSMENT & PLAN:    Chronic diastolic CHF/pulmonary hypertension Severely elevated right heart pressures documented on right heart catheterization and echocardiogram dating back several years -Does not want to go to [facility] to advanced heart failure clinic -Massive fluid overload with pitting edema up into his thighs, abdominal distention Family reports he is slowly improving on torsemide 40 twice daily Recommend we continue torsemide 40 twice daily for now with close follow-up We will arrange lab work in 1 week  Atrial fibrillation, unspecified type ([place]) Permanent atrial fibrillation On anticoagulation,  Rate controlled, not on beta-blocker No changes to his medications  Coronary artery disease of native artery of native heart with stable angina pectoris (HCC) Currently with no symptoms of angina. No further workup at this time. Continue current medication regimen.  PAD (peripheral artery disease) (HCC) Denies claudication symptoms Significant leg weakness, does not ambulate very far  Bilateral carotid artery disease, unspecified type ([place]) Cholesterol at goal Mild disease on left  S/P CABG (coronary artery bypass graft) Currently with no symptoms of angina.   Essential hypertension Blood pressure is well controlled on today's visit. No changes made to the medications.   Total encounter time more than 30 minutes  Greater than 50% was spent in  counseling and coordination of care with the patient   Signed, Ida Rogue, MD  06/11/2022 3:47 PM    [place] Office [id number], [place], [place] [address]

## 2022-06-11 NOTE — Patient Instructions (Addendum)
Medication Instructions:  No changes  If you need a refill on your cardiac medications before your next appointment, please call your pharmacy.   Lab work: BMP in 1 week with home health  Testing/Procedures: No new testing needed  Follow-Up: At [facility], you and your health needs are our priority.  As part of our continuing mission to provide you with exceptional heart care, we have created designated Provider Care Teams.  These Care Teams include your primary Cardiologist (physician) and Advanced Practice Providers (APPs -  Physician Assistants and Nurse Practitioners) who all work together to provide you with the care you need, when you need it.  You will need a follow up appointment in 3 weeks WITH APP/OR DR Rockey Situ   Providers on your designated Care Team:   Murray Hodgkins, NP Christell Faith, PA-C Cadence Kathlen Mody, [place]  COVID-19 Vaccine Information can be found at: [email or web address] For questions related to vaccine distribution or appointments, please email [email or web address] or call [phone].

## 2022-06-12 ENCOUNTER — Ambulatory Visit: Payer: Medicaid Other | Admitting: Podiatry

## 2022-06-12 DIAGNOSIS — Z9181 History of falling: Secondary | ICD-10-CM | POA: Diagnosis not present

## 2022-06-12 DIAGNOSIS — I2510 Atherosclerotic heart disease of native coronary artery without angina pectoris: Secondary | ICD-10-CM | POA: Diagnosis not present

## 2022-06-12 DIAGNOSIS — D631 Anemia in chronic kidney disease: Secondary | ICD-10-CM | POA: Diagnosis not present

## 2022-06-12 DIAGNOSIS — I5030 Unspecified diastolic (congestive) heart failure: Secondary | ICD-10-CM | POA: Diagnosis not present

## 2022-06-12 DIAGNOSIS — I130 Hypertensive heart and chronic kidney disease with heart failure and stage 1 through stage 4 chronic kidney disease, or unspecified chronic kidney disease: Secondary | ICD-10-CM | POA: Diagnosis not present

## 2022-06-12 DIAGNOSIS — L0390 Cellulitis, unspecified: Secondary | ICD-10-CM | POA: Diagnosis not present

## 2022-06-12 DIAGNOSIS — I872 Venous insufficiency (chronic) (peripheral): Secondary | ICD-10-CM | POA: Diagnosis not present

## 2022-06-12 DIAGNOSIS — Z7901 Long term (current) use of anticoagulants: Secondary | ICD-10-CM | POA: Diagnosis not present

## 2022-06-12 DIAGNOSIS — G629 Polyneuropathy, unspecified: Secondary | ICD-10-CM | POA: Diagnosis not present

## 2022-06-12 DIAGNOSIS — E785 Hyperlipidemia, unspecified: Secondary | ICD-10-CM | POA: Diagnosis not present

## 2022-06-12 DIAGNOSIS — I272 Pulmonary hypertension, unspecified: Secondary | ICD-10-CM | POA: Diagnosis not present

## 2022-06-12 DIAGNOSIS — R7303 Prediabetes: Secondary | ICD-10-CM | POA: Diagnosis not present

## 2022-06-12 DIAGNOSIS — I4821 Permanent atrial fibrillation: Secondary | ICD-10-CM | POA: Diagnosis not present

## 2022-06-12 DIAGNOSIS — Z955 Presence of coronary angioplasty implant and graft: Secondary | ICD-10-CM | POA: Diagnosis not present

## 2022-06-12 DIAGNOSIS — I739 Peripheral vascular disease, unspecified: Secondary | ICD-10-CM | POA: Diagnosis not present

## 2022-06-12 DIAGNOSIS — N189 Chronic kidney disease, unspecified: Secondary | ICD-10-CM | POA: Diagnosis not present

## 2022-06-14 ENCOUNTER — Encounter (INDEPENDENT_AMBULATORY_CARE_PROVIDER_SITE_OTHER): Payer: Medicaid Other | Admitting: Vascular Surgery

## 2022-06-14 ENCOUNTER — Encounter: Payer: Self-pay | Admitting: Podiatry

## 2022-06-14 ENCOUNTER — Ambulatory Visit (INDEPENDENT_AMBULATORY_CARE_PROVIDER_SITE_OTHER): Payer: Medicare HMO | Admitting: Podiatry

## 2022-06-14 DIAGNOSIS — B351 Tinea unguium: Secondary | ICD-10-CM | POA: Diagnosis not present

## 2022-06-14 DIAGNOSIS — M79675 Pain in left toe(s): Secondary | ICD-10-CM | POA: Diagnosis not present

## 2022-06-14 DIAGNOSIS — M79674 Pain in right toe(s): Secondary | ICD-10-CM

## 2022-06-15 ENCOUNTER — Encounter: Payer: Medicare HMO | Admitting: Oncology

## 2022-06-15 ENCOUNTER — Other Ambulatory Visit: Payer: Medicare HMO

## 2022-06-18 DIAGNOSIS — I272 Pulmonary hypertension, unspecified: Secondary | ICD-10-CM | POA: Diagnosis not present

## 2022-06-18 DIAGNOSIS — Z7901 Long term (current) use of anticoagulants: Secondary | ICD-10-CM | POA: Diagnosis not present

## 2022-06-18 DIAGNOSIS — E785 Hyperlipidemia, unspecified: Secondary | ICD-10-CM | POA: Diagnosis not present

## 2022-06-18 DIAGNOSIS — I2510 Atherosclerotic heart disease of native coronary artery without angina pectoris: Secondary | ICD-10-CM | POA: Diagnosis not present

## 2022-06-18 DIAGNOSIS — Z955 Presence of coronary angioplasty implant and graft: Secondary | ICD-10-CM | POA: Diagnosis not present

## 2022-06-18 DIAGNOSIS — G629 Polyneuropathy, unspecified: Secondary | ICD-10-CM | POA: Diagnosis not present

## 2022-06-18 DIAGNOSIS — N189 Chronic kidney disease, unspecified: Secondary | ICD-10-CM | POA: Diagnosis not present

## 2022-06-18 DIAGNOSIS — I872 Venous insufficiency (chronic) (peripheral): Secondary | ICD-10-CM | POA: Diagnosis not present

## 2022-06-18 DIAGNOSIS — I5030 Unspecified diastolic (congestive) heart failure: Secondary | ICD-10-CM | POA: Diagnosis not present

## 2022-06-18 DIAGNOSIS — I130 Hypertensive heart and chronic kidney disease with heart failure and stage 1 through stage 4 chronic kidney disease, or unspecified chronic kidney disease: Secondary | ICD-10-CM | POA: Diagnosis not present

## 2022-06-18 DIAGNOSIS — I4821 Permanent atrial fibrillation: Secondary | ICD-10-CM | POA: Diagnosis not present

## 2022-06-18 DIAGNOSIS — L0390 Cellulitis, unspecified: Secondary | ICD-10-CM | POA: Diagnosis not present

## 2022-06-18 DIAGNOSIS — I739 Peripheral vascular disease, unspecified: Secondary | ICD-10-CM | POA: Diagnosis not present

## 2022-06-18 DIAGNOSIS — D631 Anemia in chronic kidney disease: Secondary | ICD-10-CM | POA: Diagnosis not present

## 2022-06-18 DIAGNOSIS — Z9181 History of falling: Secondary | ICD-10-CM | POA: Diagnosis not present

## 2022-06-18 DIAGNOSIS — R7303 Prediabetes: Secondary | ICD-10-CM | POA: Diagnosis not present

## 2022-06-19 DIAGNOSIS — E785 Hyperlipidemia, unspecified: Secondary | ICD-10-CM | POA: Diagnosis not present

## 2022-06-19 DIAGNOSIS — I872 Venous insufficiency (chronic) (peripheral): Secondary | ICD-10-CM | POA: Diagnosis not present

## 2022-06-19 DIAGNOSIS — N189 Chronic kidney disease, unspecified: Secondary | ICD-10-CM | POA: Diagnosis not present

## 2022-06-19 DIAGNOSIS — I739 Peripheral vascular disease, unspecified: Secondary | ICD-10-CM | POA: Diagnosis not present

## 2022-06-19 DIAGNOSIS — Z9181 History of falling: Secondary | ICD-10-CM | POA: Diagnosis not present

## 2022-06-19 DIAGNOSIS — G629 Polyneuropathy, unspecified: Secondary | ICD-10-CM | POA: Diagnosis not present

## 2022-06-19 DIAGNOSIS — D631 Anemia in chronic kidney disease: Secondary | ICD-10-CM | POA: Diagnosis not present

## 2022-06-19 DIAGNOSIS — I5030 Unspecified diastolic (congestive) heart failure: Secondary | ICD-10-CM | POA: Diagnosis not present

## 2022-06-19 DIAGNOSIS — I130 Hypertensive heart and chronic kidney disease with heart failure and stage 1 through stage 4 chronic kidney disease, or unspecified chronic kidney disease: Secondary | ICD-10-CM | POA: Diagnosis not present

## 2022-06-19 DIAGNOSIS — I272 Pulmonary hypertension, unspecified: Secondary | ICD-10-CM | POA: Diagnosis not present

## 2022-06-19 DIAGNOSIS — I4821 Permanent atrial fibrillation: Secondary | ICD-10-CM | POA: Diagnosis not present

## 2022-06-19 DIAGNOSIS — Z7901 Long term (current) use of anticoagulants: Secondary | ICD-10-CM | POA: Diagnosis not present

## 2022-06-19 DIAGNOSIS — L0390 Cellulitis, unspecified: Secondary | ICD-10-CM | POA: Diagnosis not present

## 2022-06-19 DIAGNOSIS — Z955 Presence of coronary angioplasty implant and graft: Secondary | ICD-10-CM | POA: Diagnosis not present

## 2022-06-19 DIAGNOSIS — I2510 Atherosclerotic heart disease of native coronary artery without angina pectoris: Secondary | ICD-10-CM | POA: Diagnosis not present

## 2022-06-19 DIAGNOSIS — R7303 Prediabetes: Secondary | ICD-10-CM | POA: Diagnosis not present

## 2022-06-20 NOTE — Progress Notes (Signed)
This patient returns to my office for at risk foot care.  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  This patient is unable to cut nails himself since the patient cannot reach his nails.These nails are painful walking and wearing shoes.  This patient presents for at risk foot care today.  General Appearance  Alert, conversant and in no acute stress.  Vascular  Dorsalis pedis and posterior tibial  pulses are absent  bilaterally.  Capillary return is within normal limits  bilaterally. Cold feet.  Bilaterally.  Venous disease both feet/legs.  Neurologic  Senn-Weinstein monofilament wire test within normal limits  bilaterally. Muscle power within normal limits bilaterally.  Nails Thick disfigured discolored nails with subungual debris  from hallux to fifth toes bilaterally. No evidence of bacterial infection or drainage bilaterally.  Orthopedic  No limitations of motion  feet .  No crepitus or effusions noted.  No bony pathology or digital deformities noted.  Ankle arthritis.  Skin  normotropic skin with no porokeratosis noted bilaterally.  No signs of infections or ulcers noted.     Onychomycosis  Pain in right toes  Pain in left toes  Consent was obtained for treatment procedures.   Mechanical debridement of nails 1-5  bilaterally performed with a nail nipper.  Filed with dremel without incident.    Return office visit  3 months                    Told patient to return for periodic foot care and evaluation due to potential at risk complications.   Boneta Lucks D.P.M.

## 2022-06-21 DIAGNOSIS — G629 Polyneuropathy, unspecified: Secondary | ICD-10-CM | POA: Diagnosis not present

## 2022-06-21 DIAGNOSIS — I872 Venous insufficiency (chronic) (peripheral): Secondary | ICD-10-CM | POA: Diagnosis not present

## 2022-06-21 DIAGNOSIS — I739 Peripheral vascular disease, unspecified: Secondary | ICD-10-CM | POA: Diagnosis not present

## 2022-06-21 DIAGNOSIS — I130 Hypertensive heart and chronic kidney disease with heart failure and stage 1 through stage 4 chronic kidney disease, or unspecified chronic kidney disease: Secondary | ICD-10-CM | POA: Diagnosis not present

## 2022-06-21 DIAGNOSIS — L0390 Cellulitis, unspecified: Secondary | ICD-10-CM | POA: Diagnosis not present

## 2022-06-21 DIAGNOSIS — I4821 Permanent atrial fibrillation: Secondary | ICD-10-CM | POA: Diagnosis not present

## 2022-06-21 DIAGNOSIS — R7303 Prediabetes: Secondary | ICD-10-CM | POA: Diagnosis not present

## 2022-06-21 DIAGNOSIS — I272 Pulmonary hypertension, unspecified: Secondary | ICD-10-CM | POA: Diagnosis not present

## 2022-06-21 DIAGNOSIS — Z7901 Long term (current) use of anticoagulants: Secondary | ICD-10-CM | POA: Diagnosis not present

## 2022-06-21 DIAGNOSIS — I5030 Unspecified diastolic (congestive) heart failure: Secondary | ICD-10-CM | POA: Diagnosis not present

## 2022-06-21 DIAGNOSIS — I2510 Atherosclerotic heart disease of native coronary artery without angina pectoris: Secondary | ICD-10-CM | POA: Diagnosis not present

## 2022-06-21 DIAGNOSIS — Z9181 History of falling: Secondary | ICD-10-CM | POA: Diagnosis not present

## 2022-06-21 DIAGNOSIS — N189 Chronic kidney disease, unspecified: Secondary | ICD-10-CM | POA: Diagnosis not present

## 2022-06-21 DIAGNOSIS — D631 Anemia in chronic kidney disease: Secondary | ICD-10-CM | POA: Diagnosis not present

## 2022-06-21 DIAGNOSIS — Z955 Presence of coronary angioplasty implant and graft: Secondary | ICD-10-CM | POA: Diagnosis not present

## 2022-06-21 DIAGNOSIS — E785 Hyperlipidemia, unspecified: Secondary | ICD-10-CM | POA: Diagnosis not present

## 2022-06-26 DIAGNOSIS — I4821 Permanent atrial fibrillation: Secondary | ICD-10-CM | POA: Diagnosis not present

## 2022-06-26 DIAGNOSIS — I739 Peripheral vascular disease, unspecified: Secondary | ICD-10-CM | POA: Diagnosis not present

## 2022-06-26 DIAGNOSIS — I872 Venous insufficiency (chronic) (peripheral): Secondary | ICD-10-CM | POA: Diagnosis not present

## 2022-06-26 DIAGNOSIS — D631 Anemia in chronic kidney disease: Secondary | ICD-10-CM | POA: Diagnosis not present

## 2022-06-26 DIAGNOSIS — R7303 Prediabetes: Secondary | ICD-10-CM | POA: Diagnosis not present

## 2022-06-26 DIAGNOSIS — L0390 Cellulitis, unspecified: Secondary | ICD-10-CM | POA: Diagnosis not present

## 2022-06-26 DIAGNOSIS — N189 Chronic kidney disease, unspecified: Secondary | ICD-10-CM | POA: Diagnosis not present

## 2022-06-26 DIAGNOSIS — I2510 Atherosclerotic heart disease of native coronary artery without angina pectoris: Secondary | ICD-10-CM | POA: Diagnosis not present

## 2022-06-26 DIAGNOSIS — I130 Hypertensive heart and chronic kidney disease with heart failure and stage 1 through stage 4 chronic kidney disease, or unspecified chronic kidney disease: Secondary | ICD-10-CM | POA: Diagnosis not present

## 2022-06-26 DIAGNOSIS — E785 Hyperlipidemia, unspecified: Secondary | ICD-10-CM | POA: Diagnosis not present

## 2022-06-26 DIAGNOSIS — I5030 Unspecified diastolic (congestive) heart failure: Secondary | ICD-10-CM | POA: Diagnosis not present

## 2022-06-26 DIAGNOSIS — I272 Pulmonary hypertension, unspecified: Secondary | ICD-10-CM | POA: Diagnosis not present

## 2022-06-26 DIAGNOSIS — G629 Polyneuropathy, unspecified: Secondary | ICD-10-CM | POA: Diagnosis not present

## 2022-06-26 DIAGNOSIS — Z955 Presence of coronary angioplasty implant and graft: Secondary | ICD-10-CM | POA: Diagnosis not present

## 2022-06-26 DIAGNOSIS — Z7901 Long term (current) use of anticoagulants: Secondary | ICD-10-CM | POA: Diagnosis not present

## 2022-06-26 DIAGNOSIS — Z9181 History of falling: Secondary | ICD-10-CM | POA: Diagnosis not present

## 2022-06-27 DIAGNOSIS — Z7901 Long term (current) use of anticoagulants: Secondary | ICD-10-CM | POA: Diagnosis not present

## 2022-06-27 DIAGNOSIS — I4821 Permanent atrial fibrillation: Secondary | ICD-10-CM | POA: Diagnosis not present

## 2022-06-27 DIAGNOSIS — G629 Polyneuropathy, unspecified: Secondary | ICD-10-CM | POA: Diagnosis not present

## 2022-06-27 DIAGNOSIS — I739 Peripheral vascular disease, unspecified: Secondary | ICD-10-CM | POA: Diagnosis not present

## 2022-06-27 DIAGNOSIS — L0390 Cellulitis, unspecified: Secondary | ICD-10-CM | POA: Diagnosis not present

## 2022-06-27 DIAGNOSIS — N189 Chronic kidney disease, unspecified: Secondary | ICD-10-CM | POA: Diagnosis not present

## 2022-06-27 DIAGNOSIS — I5030 Unspecified diastolic (congestive) heart failure: Secondary | ICD-10-CM | POA: Diagnosis not present

## 2022-06-27 DIAGNOSIS — I872 Venous insufficiency (chronic) (peripheral): Secondary | ICD-10-CM | POA: Diagnosis not present

## 2022-06-27 DIAGNOSIS — I130 Hypertensive heart and chronic kidney disease with heart failure and stage 1 through stage 4 chronic kidney disease, or unspecified chronic kidney disease: Secondary | ICD-10-CM | POA: Diagnosis not present

## 2022-06-27 DIAGNOSIS — R7303 Prediabetes: Secondary | ICD-10-CM | POA: Diagnosis not present

## 2022-06-27 DIAGNOSIS — Z955 Presence of coronary angioplasty implant and graft: Secondary | ICD-10-CM | POA: Diagnosis not present

## 2022-06-27 DIAGNOSIS — E785 Hyperlipidemia, unspecified: Secondary | ICD-10-CM | POA: Diagnosis not present

## 2022-06-27 DIAGNOSIS — I272 Pulmonary hypertension, unspecified: Secondary | ICD-10-CM | POA: Diagnosis not present

## 2022-06-27 DIAGNOSIS — D631 Anemia in chronic kidney disease: Secondary | ICD-10-CM | POA: Diagnosis not present

## 2022-06-27 DIAGNOSIS — Z9181 History of falling: Secondary | ICD-10-CM | POA: Diagnosis not present

## 2022-06-27 DIAGNOSIS — I2510 Atherosclerotic heart disease of native coronary artery without angina pectoris: Secondary | ICD-10-CM | POA: Diagnosis not present

## 2022-07-02 ENCOUNTER — Ambulatory Visit: Payer: Medicare HMO | Admitting: Cardiovascular Disease

## 2022-07-03 ENCOUNTER — Other Ambulatory Visit: Payer: Self-pay | Admitting: Family

## 2022-07-03 DIAGNOSIS — N189 Chronic kidney disease, unspecified: Secondary | ICD-10-CM | POA: Diagnosis not present

## 2022-07-03 DIAGNOSIS — Z955 Presence of coronary angioplasty implant and graft: Secondary | ICD-10-CM | POA: Diagnosis not present

## 2022-07-03 DIAGNOSIS — E785 Hyperlipidemia, unspecified: Secondary | ICD-10-CM | POA: Diagnosis not present

## 2022-07-03 DIAGNOSIS — L0390 Cellulitis, unspecified: Secondary | ICD-10-CM | POA: Diagnosis not present

## 2022-07-03 DIAGNOSIS — G629 Polyneuropathy, unspecified: Secondary | ICD-10-CM | POA: Diagnosis not present

## 2022-07-03 DIAGNOSIS — I5030 Unspecified diastolic (congestive) heart failure: Secondary | ICD-10-CM | POA: Diagnosis not present

## 2022-07-03 DIAGNOSIS — I130 Hypertensive heart and chronic kidney disease with heart failure and stage 1 through stage 4 chronic kidney disease, or unspecified chronic kidney disease: Secondary | ICD-10-CM | POA: Diagnosis not present

## 2022-07-03 DIAGNOSIS — Z9181 History of falling: Secondary | ICD-10-CM | POA: Diagnosis not present

## 2022-07-03 DIAGNOSIS — I272 Pulmonary hypertension, unspecified: Secondary | ICD-10-CM | POA: Diagnosis not present

## 2022-07-03 DIAGNOSIS — I739 Peripheral vascular disease, unspecified: Secondary | ICD-10-CM | POA: Diagnosis not present

## 2022-07-03 DIAGNOSIS — Z7901 Long term (current) use of anticoagulants: Secondary | ICD-10-CM | POA: Diagnosis not present

## 2022-07-03 DIAGNOSIS — R7303 Prediabetes: Secondary | ICD-10-CM | POA: Diagnosis not present

## 2022-07-03 DIAGNOSIS — I872 Venous insufficiency (chronic) (peripheral): Secondary | ICD-10-CM | POA: Diagnosis not present

## 2022-07-03 DIAGNOSIS — D631 Anemia in chronic kidney disease: Secondary | ICD-10-CM | POA: Diagnosis not present

## 2022-07-03 DIAGNOSIS — M7989 Other specified soft tissue disorders: Secondary | ICD-10-CM

## 2022-07-03 DIAGNOSIS — I2510 Atherosclerotic heart disease of native coronary artery without angina pectoris: Secondary | ICD-10-CM | POA: Diagnosis not present

## 2022-07-03 DIAGNOSIS — I4821 Permanent atrial fibrillation: Secondary | ICD-10-CM | POA: Diagnosis not present

## 2022-07-04 DIAGNOSIS — I872 Venous insufficiency (chronic) (peripheral): Secondary | ICD-10-CM | POA: Diagnosis not present

## 2022-07-04 DIAGNOSIS — I2510 Atherosclerotic heart disease of native coronary artery without angina pectoris: Secondary | ICD-10-CM | POA: Diagnosis not present

## 2022-07-04 DIAGNOSIS — N189 Chronic kidney disease, unspecified: Secondary | ICD-10-CM | POA: Diagnosis not present

## 2022-07-04 DIAGNOSIS — I272 Pulmonary hypertension, unspecified: Secondary | ICD-10-CM | POA: Diagnosis not present

## 2022-07-04 DIAGNOSIS — Z9181 History of falling: Secondary | ICD-10-CM | POA: Diagnosis not present

## 2022-07-04 DIAGNOSIS — Z7901 Long term (current) use of anticoagulants: Secondary | ICD-10-CM | POA: Diagnosis not present

## 2022-07-04 DIAGNOSIS — G629 Polyneuropathy, unspecified: Secondary | ICD-10-CM | POA: Diagnosis not present

## 2022-07-04 DIAGNOSIS — I4821 Permanent atrial fibrillation: Secondary | ICD-10-CM | POA: Diagnosis not present

## 2022-07-04 DIAGNOSIS — Z955 Presence of coronary angioplasty implant and graft: Secondary | ICD-10-CM | POA: Diagnosis not present

## 2022-07-04 DIAGNOSIS — I739 Peripheral vascular disease, unspecified: Secondary | ICD-10-CM | POA: Diagnosis not present

## 2022-07-04 DIAGNOSIS — D631 Anemia in chronic kidney disease: Secondary | ICD-10-CM | POA: Diagnosis not present

## 2022-07-04 DIAGNOSIS — I130 Hypertensive heart and chronic kidney disease with heart failure and stage 1 through stage 4 chronic kidney disease, or unspecified chronic kidney disease: Secondary | ICD-10-CM | POA: Diagnosis not present

## 2022-07-04 DIAGNOSIS — R7303 Prediabetes: Secondary | ICD-10-CM | POA: Diagnosis not present

## 2022-07-04 DIAGNOSIS — E785 Hyperlipidemia, unspecified: Secondary | ICD-10-CM | POA: Diagnosis not present

## 2022-07-04 DIAGNOSIS — L0390 Cellulitis, unspecified: Secondary | ICD-10-CM | POA: Diagnosis not present

## 2022-07-04 DIAGNOSIS — I5030 Unspecified diastolic (congestive) heart failure: Secondary | ICD-10-CM | POA: Diagnosis not present

## 2022-07-06 DIAGNOSIS — D631 Anemia in chronic kidney disease: Secondary | ICD-10-CM | POA: Diagnosis not present

## 2022-07-06 DIAGNOSIS — I130 Hypertensive heart and chronic kidney disease with heart failure and stage 1 through stage 4 chronic kidney disease, or unspecified chronic kidney disease: Secondary | ICD-10-CM | POA: Diagnosis not present

## 2022-07-06 DIAGNOSIS — E785 Hyperlipidemia, unspecified: Secondary | ICD-10-CM | POA: Diagnosis not present

## 2022-07-06 DIAGNOSIS — L0390 Cellulitis, unspecified: Secondary | ICD-10-CM | POA: Diagnosis not present

## 2022-07-06 DIAGNOSIS — R7303 Prediabetes: Secondary | ICD-10-CM | POA: Diagnosis not present

## 2022-07-06 DIAGNOSIS — N189 Chronic kidney disease, unspecified: Secondary | ICD-10-CM | POA: Diagnosis not present

## 2022-07-06 DIAGNOSIS — I4821 Permanent atrial fibrillation: Secondary | ICD-10-CM | POA: Diagnosis not present

## 2022-07-06 DIAGNOSIS — I739 Peripheral vascular disease, unspecified: Secondary | ICD-10-CM | POA: Diagnosis not present

## 2022-07-06 DIAGNOSIS — Z9181 History of falling: Secondary | ICD-10-CM | POA: Diagnosis not present

## 2022-07-06 DIAGNOSIS — Z7901 Long term (current) use of anticoagulants: Secondary | ICD-10-CM | POA: Diagnosis not present

## 2022-07-06 DIAGNOSIS — I2510 Atherosclerotic heart disease of native coronary artery without angina pectoris: Secondary | ICD-10-CM | POA: Diagnosis not present

## 2022-07-06 DIAGNOSIS — I5030 Unspecified diastolic (congestive) heart failure: Secondary | ICD-10-CM | POA: Diagnosis not present

## 2022-07-06 DIAGNOSIS — G629 Polyneuropathy, unspecified: Secondary | ICD-10-CM | POA: Diagnosis not present

## 2022-07-06 DIAGNOSIS — Z955 Presence of coronary angioplasty implant and graft: Secondary | ICD-10-CM | POA: Diagnosis not present

## 2022-07-06 DIAGNOSIS — I872 Venous insufficiency (chronic) (peripheral): Secondary | ICD-10-CM | POA: Diagnosis not present

## 2022-07-06 DIAGNOSIS — I272 Pulmonary hypertension, unspecified: Secondary | ICD-10-CM | POA: Diagnosis not present

## 2022-07-09 ENCOUNTER — Encounter (INDEPENDENT_AMBULATORY_CARE_PROVIDER_SITE_OTHER): Payer: Self-pay

## 2022-07-11 DIAGNOSIS — D631 Anemia in chronic kidney disease: Secondary | ICD-10-CM | POA: Diagnosis not present

## 2022-07-11 DIAGNOSIS — I5030 Unspecified diastolic (congestive) heart failure: Secondary | ICD-10-CM | POA: Diagnosis not present

## 2022-07-11 DIAGNOSIS — G629 Polyneuropathy, unspecified: Secondary | ICD-10-CM | POA: Diagnosis not present

## 2022-07-11 DIAGNOSIS — I272 Pulmonary hypertension, unspecified: Secondary | ICD-10-CM | POA: Diagnosis not present

## 2022-07-11 DIAGNOSIS — I130 Hypertensive heart and chronic kidney disease with heart failure and stage 1 through stage 4 chronic kidney disease, or unspecified chronic kidney disease: Secondary | ICD-10-CM | POA: Diagnosis not present

## 2022-07-11 DIAGNOSIS — I2510 Atherosclerotic heart disease of native coronary artery without angina pectoris: Secondary | ICD-10-CM | POA: Diagnosis not present

## 2022-07-11 DIAGNOSIS — N189 Chronic kidney disease, unspecified: Secondary | ICD-10-CM | POA: Diagnosis not present

## 2022-07-11 DIAGNOSIS — I872 Venous insufficiency (chronic) (peripheral): Secondary | ICD-10-CM | POA: Diagnosis not present

## 2022-07-11 DIAGNOSIS — Z7901 Long term (current) use of anticoagulants: Secondary | ICD-10-CM | POA: Diagnosis not present

## 2022-07-11 DIAGNOSIS — Z955 Presence of coronary angioplasty implant and graft: Secondary | ICD-10-CM | POA: Diagnosis not present

## 2022-07-11 DIAGNOSIS — Z9181 History of falling: Secondary | ICD-10-CM | POA: Diagnosis not present

## 2022-07-11 DIAGNOSIS — I4821 Permanent atrial fibrillation: Secondary | ICD-10-CM | POA: Diagnosis not present

## 2022-07-11 DIAGNOSIS — I739 Peripheral vascular disease, unspecified: Secondary | ICD-10-CM | POA: Diagnosis not present

## 2022-07-11 DIAGNOSIS — L0390 Cellulitis, unspecified: Secondary | ICD-10-CM | POA: Diagnosis not present

## 2022-07-11 DIAGNOSIS — E785 Hyperlipidemia, unspecified: Secondary | ICD-10-CM | POA: Diagnosis not present

## 2022-07-11 DIAGNOSIS — R7303 Prediabetes: Secondary | ICD-10-CM | POA: Diagnosis not present

## 2022-07-15 NOTE — Progress Notes (Deleted)
Date:  07/15/2022   ID:  Jordan Perkins, DOB March 27, 1941, MRN [id number]  Patient Location:  [place] [id number]   Provider location:   [facility], [place] office  PCP:  [name], MD  Cardiologist:  [facility]  No chief complaint on file.   History of Present Illness:    Jordan Perkins is a 81 y.o. male  past medical history of CAD,  bypass surgery, 2005 at [facility] stents in 2005,  hypertension,  hyperlipidemia,  Chronic atrial fibrillation , on xarelto hospital with shortness of breath and chest pain, rapid atrial fibrillation.  Echocardiogram showed ejection fraction 60-65% Carotid 07/2016: 40 to 59% stenosis on left, <39% on the right Pulmonary HTN, right heart pressures estimated 100 on echocardiogram Chronic anemia Who presents for follow up of his CAD and atrial fibrillation, pulmonary HTN (estimated right heart pressures on echo of 100 mmHg on recent echo May 2022 and echo July 2021)  Last seen by myself in clinic October 2023  emergency room June 04, 2022 Did not stay for complete evaluation At that time reported leg swelling, abdominal swelling Over the telephone was told to increase torsemide up to 40 twice daily  Presents today with his niece Still with significant leg edema though she reports abdomen less distended, lower extremity edema below the knees has improved but still significant Reports drinking several bottles of water a day Not very active at home secondary to leg swelling and immobility Today in a wheelchair  Does not eat out at restaurants very much Has been a nurse  EKG personally reviewed by myself on todays visit Atrial fibrillation ventricular rate 66 bpm  Prior procedure data reviewed RHC performed June 09, 2021 Severe pulmonary hypertension (mean PAP 63 mmHg, PVR 5.9 WU). Moderately elevated left and right heart filling pressures (RAP 15 mmHg, PCWP 30  mmHg). Normal Fick cardiac output/index.  In follow-up today he is unsure if he is taking torsemide once a day or twice a day Denies significant leg swelling, no shortness of breath Thinks he is on sildenafil, he is unsure, possibly twice a day Previously declined referral to pulmonary hypertension clinic in [place], unable to drive Lives with his brother  Reports walking limited by arthritides  EKG personally reviewed by myself on todays visit Atrial fibrillation rate 70 bpm no significant ST-T wave changes  Other past medical history reviewed In the hospital 04/2021, weak, sodium low, 120 Given IVF, sodium improved by increasing his oral salt intake Was discharged back on his torsemide Now sodium 139  History of chronic anemia, Last hemoglobin 9.9 , chronic  Other lab work reviewed Creatinine 1.08, BUN 27  Echo 01/2021  Right heart pressures estimated 100 mmHg on echo  1. Left ventricular ejection fraction, by estimation, is 55 to 60%. The  left ventricle has normal function. The left ventricle has no regional  wall motion abnormalities. Left ventricular diastolic parameters are  consistent with Grade II diastolic  dysfunction (pseudonormalization).   2. Right ventricular systolic function is mildly reduced. The right  ventricular size is moderately enlarged.   3. Left atrial size was severely dilated.   4. Right atrial size was moderately dilated.   5. The mitral valve is degenerative. Mild mitral valve regurgitation.   6. The aortic valve is tricuspid. Aortic valve regurgitation is not  visualized. Mild aortic valve sclerosis is present, with no evidence of  aortic valve stenosis.   7.  The inferior vena cava is dilated in size with <50% respiratory  variability, suggesting right atrial pressure of 15 mmHg.   Echocardiogram July 2021 ejection fraction 60 to 65% Severely elevated right heart pressures  Zio: Atrial Fibrillation occurred continuously (100% burden),  ranging from 32-104 bpm (avg of 56 bpm). 2 Ventricular Tachycardia runs occurred, the run with the fastest interval lasting 7 beats with a max rate of 171 bpm (avg 117 bpm);  the run with the fastest interval was also the longest.  Isolated VEs were occasional (2.6%, 28107), VE Couplets were rare (<1.0%, 393), and VE Triplets were rare (<1.0%, 14). Ventricular Bigeminy and Trigeminy were present.  Hospital admission 10/2016 Hyponatremia, weakness, elevated T.Bili (gilberts)   hospital admission in 2014, noted to have gallstones.   negative Hida scan. Now s/p  Cholecystectomy 11/14   CT scan in the hospital of his abdomen and pelvis showed acute pancreatitis with increased fluid density in the peri-pancreatic fat, multiple gallstones, small hiatal hernia   Past Medical History:  Diagnosis Date   (HFpEF) heart failure with preserved ejection fraction (HCC)    CHF (congestive heart failure) (HCC)    Coronary artery disease    Hyperlipidemia    Hypertension    PAH (pulmonary artery hypertension) (HCC)    Permanent atrial fibrillation (HCC)    Pleural effusion    Past Surgical History:  Procedure Laterality Date   CARDIAC CATHETERIZATION     CHOLECYSTECTOMY     COLONOSCOPY WITH PROPOFOL N/A 08/23/2020   Procedure: COLONOSCOPY WITH PROPOFOL;  Surgeon: [name], MD;  Location: [facility] ENDOSCOPY;  Service: Gastroenterology;  Laterality: N/A;   CORONARY ANGIOPLASTY  06/2004   s/p stent placement @ [facility]   CORONARY ARTERY BYPASS GRAFT  04-24-2004   CABG x 3 [facility]   ESOPHAGOGASTRODUODENOSCOPY (EGD) WITH PROPOFOL N/A 08/23/2020   Procedure: ESOPHAGOGASTRODUODENOSCOPY (EGD) WITH PROPOFOL;  Surgeon: [name], MD;  Location: [place];  Service: Gastroenterology;  Laterality: N/A;  Needs to be last case   RIGHT HEART CATH Right 06/09/2021   Procedure: RIGHT HEART CATH;  Surgeon: Nelva Bush, MD;  Location: [place] [facility];  Service: Cardiovascular;  Laterality:  Right;     No outpatient medications have been marked as taking for the 07/16/22 encounter (Appointment) with [name], MD.     Allergies:   Sulfa antibiotics   Social History   Tobacco Use   Smoking status: Never   Smokeless tobacco: Never  Vaping Use   Vaping Use: Never used  Substance Use Topics   Alcohol use: No   Drug use: No     Family Hx: The patient's family history includes Heart attack in his brother; Heart disease in his father and paternal uncle.  ROS:   Review of Systems  Constitutional: Negative.   HENT: Negative.    Respiratory: Negative.    Cardiovascular: Negative.   Gastrointestinal: Negative.   Musculoskeletal: Negative.   Neurological: Negative.   Psychiatric/Behavioral: Negative.    All other systems reviewed and are negative.    Labs/Other Tests and Data Reviewed:    Recent Labs: 06/04/2022: B Natriuretic Peptide 459.8; BUN 49; Creatinine, Ser 1.42; Hemoglobin 10.6; Platelets 122; Potassium 4.3; Sodium 134   Recent Lipid Panel Lab Results  Component Value Date/Time   CHOL 113 04/21/2021 01:33 PM   CHOL 89 (L) 04/06/2015 04:42 PM   CHOL 150 06/06/2013 09:47 AM   TRIG 46.0 04/21/2021 01:33 PM   TRIG 85 06/06/2013 09:47 AM  HDL 58.70 04/21/2021 01:33 PM   HDL 40 04/06/2015 04:42 PM   HDL 43 06/06/2013 09:47 AM   CHOLHDL 2 04/21/2021 01:33 PM   LDLCALC 45 04/21/2021 01:33 PM   LDLCALC 36 04/06/2015 04:42 PM   LDLCALC 90 06/06/2013 09:47 AM    Wt Readings from Last 3 Encounters:  06/11/22 172 lb (78 kg)  06/04/22 172 lb (78 kg)  06/04/22 172 lb (78 kg)     Exam:    There were no vitals taken for this visit. Constitutional:  oriented to person, place, and time. No distress.  HENT:  Head: Grossly normal Eyes:  no discharge. No scleral icterus.  Neck: No JVD, no carotid bruits  Cardiovascular: Irregularly irregular,  no murmurs appreciated Pulmonary/Chest: Clear to auscultation bilaterally, no wheezes or rails Abdominal:  Soft.  no distension.  no tenderness.  Musculoskeletal: Normal range of motion Neurological:  normal muscle tone. Coordination normal. No atrophy Skin: Skin warm and dry Psychiatric: normal affect, pleasant  ASSESSMENT & PLAN:    Chronic diastolic CHF/pulmonary hypertension Severely elevated right heart pressures documented on right heart catheterization and echocardiogram dating back several years -Does not want to go to [facility] to advanced heart failure clinic -Massive fluid overload with pitting edema up into his thighs, abdominal distention Family reports he is slowly improving on torsemide 40 twice daily Recommend we continue torsemide 40 twice daily for now with close follow-up We will arrange lab work in 1 week  Atrial fibrillation, unspecified type ([place]) Permanent atrial fibrillation On anticoagulation,  Rate controlled, not on beta-blocker No changes to his medications  Coronary artery disease of native artery of native heart with stable angina pectoris (HCC) Currently with no symptoms of angina. No further workup at this time. Continue current medication regimen.  PAD (peripheral artery disease) (HCC) Denies claudication symptoms Significant leg weakness, does not ambulate very far  Bilateral carotid artery disease, unspecified type ([place]) Cholesterol at goal Mild disease on left  S/P CABG (coronary artery bypass graft) Currently with no symptoms of angina.   Essential hypertension Blood pressure is well controlled on today's visit. No changes made to the medications.   Total encounter time more than 30 minutes  Greater than 50% was spent in counseling and coordination of care with the patient   Signed, Ida Rogue, MD  07/15/2022 10:58 AM    [place] Office [address], [place], [place] [address]

## 2022-07-16 ENCOUNTER — Ambulatory Visit: Payer: Medicare HMO | Attending: Cardiovascular Disease | Admitting: Cardiovascular Disease

## 2022-07-16 ENCOUNTER — Encounter: Payer: Self-pay | Admitting: Cardiovascular Disease

## 2022-07-16 DIAGNOSIS — I779 Disorder of arteries and arterioles, unspecified: Secondary | ICD-10-CM

## 2022-07-16 DIAGNOSIS — I272 Pulmonary hypertension, unspecified: Secondary | ICD-10-CM

## 2022-07-16 DIAGNOSIS — I25118 Atherosclerotic heart disease of native coronary artery with other forms of angina pectoris: Secondary | ICD-10-CM

## 2022-07-16 DIAGNOSIS — I739 Peripheral vascular disease, unspecified: Secondary | ICD-10-CM

## 2022-07-16 DIAGNOSIS — I4811 Longstanding persistent atrial fibrillation: Secondary | ICD-10-CM

## 2022-07-16 DIAGNOSIS — I5032 Chronic diastolic (congestive) heart failure: Secondary | ICD-10-CM

## 2022-07-16 DIAGNOSIS — E782 Mixed hyperlipidemia: Secondary | ICD-10-CM

## 2022-07-17 DIAGNOSIS — E785 Hyperlipidemia, unspecified: Secondary | ICD-10-CM | POA: Diagnosis not present

## 2022-07-17 DIAGNOSIS — Z7901 Long term (current) use of anticoagulants: Secondary | ICD-10-CM | POA: Diagnosis not present

## 2022-07-17 DIAGNOSIS — Z9181 History of falling: Secondary | ICD-10-CM | POA: Diagnosis not present

## 2022-07-17 DIAGNOSIS — I872 Venous insufficiency (chronic) (peripheral): Secondary | ICD-10-CM | POA: Diagnosis not present

## 2022-07-17 DIAGNOSIS — I4821 Permanent atrial fibrillation: Secondary | ICD-10-CM | POA: Diagnosis not present

## 2022-07-17 DIAGNOSIS — I739 Peripheral vascular disease, unspecified: Secondary | ICD-10-CM | POA: Diagnosis not present

## 2022-07-17 DIAGNOSIS — I2510 Atherosclerotic heart disease of native coronary artery without angina pectoris: Secondary | ICD-10-CM | POA: Diagnosis not present

## 2022-07-17 DIAGNOSIS — I272 Pulmonary hypertension, unspecified: Secondary | ICD-10-CM | POA: Diagnosis not present

## 2022-07-17 DIAGNOSIS — I130 Hypertensive heart and chronic kidney disease with heart failure and stage 1 through stage 4 chronic kidney disease, or unspecified chronic kidney disease: Secondary | ICD-10-CM | POA: Diagnosis not present

## 2022-07-17 DIAGNOSIS — R7303 Prediabetes: Secondary | ICD-10-CM | POA: Diagnosis not present

## 2022-07-17 DIAGNOSIS — Z955 Presence of coronary angioplasty implant and graft: Secondary | ICD-10-CM | POA: Diagnosis not present

## 2022-07-17 DIAGNOSIS — L0390 Cellulitis, unspecified: Secondary | ICD-10-CM | POA: Diagnosis not present

## 2022-07-17 DIAGNOSIS — G629 Polyneuropathy, unspecified: Secondary | ICD-10-CM | POA: Diagnosis not present

## 2022-07-17 DIAGNOSIS — N189 Chronic kidney disease, unspecified: Secondary | ICD-10-CM | POA: Diagnosis not present

## 2022-07-17 DIAGNOSIS — I5030 Unspecified diastolic (congestive) heart failure: Secondary | ICD-10-CM | POA: Diagnosis not present

## 2022-07-17 DIAGNOSIS — D631 Anemia in chronic kidney disease: Secondary | ICD-10-CM | POA: Diagnosis not present

## 2022-07-18 DIAGNOSIS — E785 Hyperlipidemia, unspecified: Secondary | ICD-10-CM | POA: Diagnosis not present

## 2022-07-18 DIAGNOSIS — R7303 Prediabetes: Secondary | ICD-10-CM | POA: Diagnosis not present

## 2022-07-18 DIAGNOSIS — I2510 Atherosclerotic heart disease of native coronary artery without angina pectoris: Secondary | ICD-10-CM | POA: Diagnosis not present

## 2022-07-18 DIAGNOSIS — I739 Peripheral vascular disease, unspecified: Secondary | ICD-10-CM | POA: Diagnosis not present

## 2022-07-18 DIAGNOSIS — Z955 Presence of coronary angioplasty implant and graft: Secondary | ICD-10-CM | POA: Diagnosis not present

## 2022-07-18 DIAGNOSIS — I872 Venous insufficiency (chronic) (peripheral): Secondary | ICD-10-CM | POA: Diagnosis not present

## 2022-07-18 DIAGNOSIS — I5030 Unspecified diastolic (congestive) heart failure: Secondary | ICD-10-CM | POA: Diagnosis not present

## 2022-07-18 DIAGNOSIS — I4821 Permanent atrial fibrillation: Secondary | ICD-10-CM | POA: Diagnosis not present

## 2022-07-18 DIAGNOSIS — Z7901 Long term (current) use of anticoagulants: Secondary | ICD-10-CM | POA: Diagnosis not present

## 2022-07-18 DIAGNOSIS — I130 Hypertensive heart and chronic kidney disease with heart failure and stage 1 through stage 4 chronic kidney disease, or unspecified chronic kidney disease: Secondary | ICD-10-CM | POA: Diagnosis not present

## 2022-07-18 DIAGNOSIS — G629 Polyneuropathy, unspecified: Secondary | ICD-10-CM | POA: Diagnosis not present

## 2022-07-18 DIAGNOSIS — N189 Chronic kidney disease, unspecified: Secondary | ICD-10-CM | POA: Diagnosis not present

## 2022-07-18 DIAGNOSIS — Z9181 History of falling: Secondary | ICD-10-CM | POA: Diagnosis not present

## 2022-07-18 DIAGNOSIS — D631 Anemia in chronic kidney disease: Secondary | ICD-10-CM | POA: Diagnosis not present

## 2022-07-18 DIAGNOSIS — L0390 Cellulitis, unspecified: Secondary | ICD-10-CM | POA: Diagnosis not present

## 2022-07-18 DIAGNOSIS — I272 Pulmonary hypertension, unspecified: Secondary | ICD-10-CM | POA: Diagnosis not present

## 2022-07-25 DIAGNOSIS — D631 Anemia in chronic kidney disease: Secondary | ICD-10-CM | POA: Diagnosis not present

## 2022-07-25 DIAGNOSIS — I272 Pulmonary hypertension, unspecified: Secondary | ICD-10-CM | POA: Diagnosis not present

## 2022-07-25 DIAGNOSIS — L0390 Cellulitis, unspecified: Secondary | ICD-10-CM | POA: Diagnosis not present

## 2022-07-25 DIAGNOSIS — R7303 Prediabetes: Secondary | ICD-10-CM | POA: Diagnosis not present

## 2022-07-25 DIAGNOSIS — I739 Peripheral vascular disease, unspecified: Secondary | ICD-10-CM | POA: Diagnosis not present

## 2022-07-25 DIAGNOSIS — I872 Venous insufficiency (chronic) (peripheral): Secondary | ICD-10-CM | POA: Diagnosis not present

## 2022-07-25 DIAGNOSIS — N189 Chronic kidney disease, unspecified: Secondary | ICD-10-CM | POA: Diagnosis not present

## 2022-07-25 DIAGNOSIS — G629 Polyneuropathy, unspecified: Secondary | ICD-10-CM | POA: Diagnosis not present

## 2022-07-25 DIAGNOSIS — Z955 Presence of coronary angioplasty implant and graft: Secondary | ICD-10-CM | POA: Diagnosis not present

## 2022-07-25 DIAGNOSIS — E785 Hyperlipidemia, unspecified: Secondary | ICD-10-CM | POA: Diagnosis not present

## 2022-07-25 DIAGNOSIS — Z9181 History of falling: Secondary | ICD-10-CM | POA: Diagnosis not present

## 2022-07-25 DIAGNOSIS — I5030 Unspecified diastolic (congestive) heart failure: Secondary | ICD-10-CM | POA: Diagnosis not present

## 2022-07-25 DIAGNOSIS — I2510 Atherosclerotic heart disease of native coronary artery without angina pectoris: Secondary | ICD-10-CM | POA: Diagnosis not present

## 2022-07-25 DIAGNOSIS — I4821 Permanent atrial fibrillation: Secondary | ICD-10-CM | POA: Diagnosis not present

## 2022-07-25 DIAGNOSIS — I130 Hypertensive heart and chronic kidney disease with heart failure and stage 1 through stage 4 chronic kidney disease, or unspecified chronic kidney disease: Secondary | ICD-10-CM | POA: Diagnosis not present

## 2022-07-25 DIAGNOSIS — Z7901 Long term (current) use of anticoagulants: Secondary | ICD-10-CM | POA: Diagnosis not present

## 2022-07-30 DIAGNOSIS — N189 Chronic kidney disease, unspecified: Secondary | ICD-10-CM | POA: Diagnosis not present

## 2022-07-30 DIAGNOSIS — L0390 Cellulitis, unspecified: Secondary | ICD-10-CM | POA: Diagnosis not present

## 2022-07-30 DIAGNOSIS — I130 Hypertensive heart and chronic kidney disease with heart failure and stage 1 through stage 4 chronic kidney disease, or unspecified chronic kidney disease: Secondary | ICD-10-CM | POA: Diagnosis not present

## 2022-07-30 DIAGNOSIS — I5030 Unspecified diastolic (congestive) heart failure: Secondary | ICD-10-CM | POA: Diagnosis not present

## 2022-07-30 DIAGNOSIS — R7303 Prediabetes: Secondary | ICD-10-CM | POA: Diagnosis not present

## 2022-07-30 DIAGNOSIS — Z9181 History of falling: Secondary | ICD-10-CM | POA: Diagnosis not present

## 2022-07-30 DIAGNOSIS — Z7901 Long term (current) use of anticoagulants: Secondary | ICD-10-CM | POA: Diagnosis not present

## 2022-07-30 DIAGNOSIS — I739 Peripheral vascular disease, unspecified: Secondary | ICD-10-CM | POA: Diagnosis not present

## 2022-07-30 DIAGNOSIS — E785 Hyperlipidemia, unspecified: Secondary | ICD-10-CM | POA: Diagnosis not present

## 2022-07-30 DIAGNOSIS — D631 Anemia in chronic kidney disease: Secondary | ICD-10-CM | POA: Diagnosis not present

## 2022-07-30 DIAGNOSIS — I4821 Permanent atrial fibrillation: Secondary | ICD-10-CM | POA: Diagnosis not present

## 2022-07-30 DIAGNOSIS — I272 Pulmonary hypertension, unspecified: Secondary | ICD-10-CM | POA: Diagnosis not present

## 2022-07-30 DIAGNOSIS — I2510 Atherosclerotic heart disease of native coronary artery without angina pectoris: Secondary | ICD-10-CM | POA: Diagnosis not present

## 2022-07-30 DIAGNOSIS — I872 Venous insufficiency (chronic) (peripheral): Secondary | ICD-10-CM | POA: Diagnosis not present

## 2022-07-30 DIAGNOSIS — G629 Polyneuropathy, unspecified: Secondary | ICD-10-CM | POA: Diagnosis not present

## 2022-07-30 DIAGNOSIS — Z955 Presence of coronary angioplasty implant and graft: Secondary | ICD-10-CM | POA: Diagnosis not present

## 2022-07-31 DIAGNOSIS — E785 Hyperlipidemia, unspecified: Secondary | ICD-10-CM | POA: Diagnosis not present

## 2022-07-31 DIAGNOSIS — I272 Pulmonary hypertension, unspecified: Secondary | ICD-10-CM | POA: Diagnosis not present

## 2022-07-31 DIAGNOSIS — G629 Polyneuropathy, unspecified: Secondary | ICD-10-CM | POA: Diagnosis not present

## 2022-07-31 DIAGNOSIS — I2510 Atherosclerotic heart disease of native coronary artery without angina pectoris: Secondary | ICD-10-CM | POA: Diagnosis not present

## 2022-07-31 DIAGNOSIS — N189 Chronic kidney disease, unspecified: Secondary | ICD-10-CM | POA: Diagnosis not present

## 2022-07-31 DIAGNOSIS — R7303 Prediabetes: Secondary | ICD-10-CM | POA: Diagnosis not present

## 2022-07-31 DIAGNOSIS — D631 Anemia in chronic kidney disease: Secondary | ICD-10-CM | POA: Diagnosis not present

## 2022-07-31 DIAGNOSIS — Z955 Presence of coronary angioplasty implant and graft: Secondary | ICD-10-CM | POA: Diagnosis not present

## 2022-07-31 DIAGNOSIS — I130 Hypertensive heart and chronic kidney disease with heart failure and stage 1 through stage 4 chronic kidney disease, or unspecified chronic kidney disease: Secondary | ICD-10-CM | POA: Diagnosis not present

## 2022-07-31 DIAGNOSIS — I872 Venous insufficiency (chronic) (peripheral): Secondary | ICD-10-CM | POA: Diagnosis not present

## 2022-07-31 DIAGNOSIS — I4821 Permanent atrial fibrillation: Secondary | ICD-10-CM | POA: Diagnosis not present

## 2022-07-31 DIAGNOSIS — I5030 Unspecified diastolic (congestive) heart failure: Secondary | ICD-10-CM | POA: Diagnosis not present

## 2022-07-31 DIAGNOSIS — Z9181 History of falling: Secondary | ICD-10-CM | POA: Diagnosis not present

## 2022-07-31 DIAGNOSIS — Z7901 Long term (current) use of anticoagulants: Secondary | ICD-10-CM | POA: Diagnosis not present

## 2022-07-31 DIAGNOSIS — I739 Peripheral vascular disease, unspecified: Secondary | ICD-10-CM | POA: Diagnosis not present

## 2022-07-31 DIAGNOSIS — L0390 Cellulitis, unspecified: Secondary | ICD-10-CM | POA: Diagnosis not present

## 2022-08-16 DIAGNOSIS — I4821 Permanent atrial fibrillation: Secondary | ICD-10-CM | POA: Diagnosis not present

## 2022-08-16 DIAGNOSIS — Z9181 History of falling: Secondary | ICD-10-CM | POA: Diagnosis not present

## 2022-08-16 DIAGNOSIS — N189 Chronic kidney disease, unspecified: Secondary | ICD-10-CM | POA: Diagnosis not present

## 2022-08-16 DIAGNOSIS — D631 Anemia in chronic kidney disease: Secondary | ICD-10-CM | POA: Diagnosis not present

## 2022-08-16 DIAGNOSIS — G629 Polyneuropathy, unspecified: Secondary | ICD-10-CM | POA: Diagnosis not present

## 2022-08-16 DIAGNOSIS — E785 Hyperlipidemia, unspecified: Secondary | ICD-10-CM | POA: Diagnosis not present

## 2022-08-16 DIAGNOSIS — Z7901 Long term (current) use of anticoagulants: Secondary | ICD-10-CM | POA: Diagnosis not present

## 2022-08-16 DIAGNOSIS — I272 Pulmonary hypertension, unspecified: Secondary | ICD-10-CM | POA: Diagnosis not present

## 2022-08-16 DIAGNOSIS — Z955 Presence of coronary angioplasty implant and graft: Secondary | ICD-10-CM | POA: Diagnosis not present

## 2022-08-16 DIAGNOSIS — I739 Peripheral vascular disease, unspecified: Secondary | ICD-10-CM | POA: Diagnosis not present

## 2022-08-16 DIAGNOSIS — I2510 Atherosclerotic heart disease of native coronary artery without angina pectoris: Secondary | ICD-10-CM | POA: Diagnosis not present

## 2022-08-16 DIAGNOSIS — I872 Venous insufficiency (chronic) (peripheral): Secondary | ICD-10-CM | POA: Diagnosis not present

## 2022-08-16 DIAGNOSIS — I5030 Unspecified diastolic (congestive) heart failure: Secondary | ICD-10-CM | POA: Diagnosis not present

## 2022-08-16 DIAGNOSIS — I130 Hypertensive heart and chronic kidney disease with heart failure and stage 1 through stage 4 chronic kidney disease, or unspecified chronic kidney disease: Secondary | ICD-10-CM | POA: Diagnosis not present

## 2022-08-16 DIAGNOSIS — R7303 Prediabetes: Secondary | ICD-10-CM | POA: Diagnosis not present

## 2022-08-16 DIAGNOSIS — L0390 Cellulitis, unspecified: Secondary | ICD-10-CM | POA: Diagnosis not present

## 2022-08-17 DIAGNOSIS — I5030 Unspecified diastolic (congestive) heart failure: Secondary | ICD-10-CM | POA: Diagnosis not present

## 2022-08-17 DIAGNOSIS — Z9181 History of falling: Secondary | ICD-10-CM | POA: Diagnosis not present

## 2022-08-17 DIAGNOSIS — I872 Venous insufficiency (chronic) (peripheral): Secondary | ICD-10-CM | POA: Diagnosis not present

## 2022-08-17 DIAGNOSIS — I130 Hypertensive heart and chronic kidney disease with heart failure and stage 1 through stage 4 chronic kidney disease, or unspecified chronic kidney disease: Secondary | ICD-10-CM | POA: Diagnosis not present

## 2022-08-17 DIAGNOSIS — E785 Hyperlipidemia, unspecified: Secondary | ICD-10-CM | POA: Diagnosis not present

## 2022-08-17 DIAGNOSIS — I739 Peripheral vascular disease, unspecified: Secondary | ICD-10-CM | POA: Diagnosis not present

## 2022-08-17 DIAGNOSIS — N189 Chronic kidney disease, unspecified: Secondary | ICD-10-CM | POA: Diagnosis not present

## 2022-08-17 DIAGNOSIS — D631 Anemia in chronic kidney disease: Secondary | ICD-10-CM | POA: Diagnosis not present

## 2022-08-17 DIAGNOSIS — L0390 Cellulitis, unspecified: Secondary | ICD-10-CM | POA: Diagnosis not present

## 2022-08-17 DIAGNOSIS — I4821 Permanent atrial fibrillation: Secondary | ICD-10-CM | POA: Diagnosis not present

## 2022-08-17 DIAGNOSIS — I2510 Atherosclerotic heart disease of native coronary artery without angina pectoris: Secondary | ICD-10-CM | POA: Diagnosis not present

## 2022-08-17 DIAGNOSIS — Z7901 Long term (current) use of anticoagulants: Secondary | ICD-10-CM | POA: Diagnosis not present

## 2022-08-17 DIAGNOSIS — Z955 Presence of coronary angioplasty implant and graft: Secondary | ICD-10-CM | POA: Diagnosis not present

## 2022-08-17 DIAGNOSIS — I272 Pulmonary hypertension, unspecified: Secondary | ICD-10-CM | POA: Diagnosis not present

## 2022-08-17 DIAGNOSIS — G629 Polyneuropathy, unspecified: Secondary | ICD-10-CM | POA: Diagnosis not present

## 2022-08-17 DIAGNOSIS — R7303 Prediabetes: Secondary | ICD-10-CM | POA: Diagnosis not present

## 2022-08-21 DIAGNOSIS — Z7901 Long term (current) use of anticoagulants: Secondary | ICD-10-CM | POA: Diagnosis not present

## 2022-08-21 DIAGNOSIS — I872 Venous insufficiency (chronic) (peripheral): Secondary | ICD-10-CM | POA: Diagnosis not present

## 2022-08-21 DIAGNOSIS — Z9181 History of falling: Secondary | ICD-10-CM | POA: Diagnosis not present

## 2022-08-21 DIAGNOSIS — I739 Peripheral vascular disease, unspecified: Secondary | ICD-10-CM | POA: Diagnosis not present

## 2022-08-21 DIAGNOSIS — G629 Polyneuropathy, unspecified: Secondary | ICD-10-CM | POA: Diagnosis not present

## 2022-08-21 DIAGNOSIS — Z955 Presence of coronary angioplasty implant and graft: Secondary | ICD-10-CM | POA: Diagnosis not present

## 2022-08-21 DIAGNOSIS — N189 Chronic kidney disease, unspecified: Secondary | ICD-10-CM | POA: Diagnosis not present

## 2022-08-21 DIAGNOSIS — I4821 Permanent atrial fibrillation: Secondary | ICD-10-CM | POA: Diagnosis not present

## 2022-08-21 DIAGNOSIS — I2510 Atherosclerotic heart disease of native coronary artery without angina pectoris: Secondary | ICD-10-CM | POA: Diagnosis not present

## 2022-08-21 DIAGNOSIS — I272 Pulmonary hypertension, unspecified: Secondary | ICD-10-CM | POA: Diagnosis not present

## 2022-08-21 DIAGNOSIS — I5030 Unspecified diastolic (congestive) heart failure: Secondary | ICD-10-CM | POA: Diagnosis not present

## 2022-08-21 DIAGNOSIS — D631 Anemia in chronic kidney disease: Secondary | ICD-10-CM | POA: Diagnosis not present

## 2022-08-21 DIAGNOSIS — R7303 Prediabetes: Secondary | ICD-10-CM | POA: Diagnosis not present

## 2022-08-21 DIAGNOSIS — I130 Hypertensive heart and chronic kidney disease with heart failure and stage 1 through stage 4 chronic kidney disease, or unspecified chronic kidney disease: Secondary | ICD-10-CM | POA: Diagnosis not present

## 2022-08-21 DIAGNOSIS — E785 Hyperlipidemia, unspecified: Secondary | ICD-10-CM | POA: Diagnosis not present

## 2022-08-21 DIAGNOSIS — L0390 Cellulitis, unspecified: Secondary | ICD-10-CM | POA: Diagnosis not present

## 2022-08-24 DIAGNOSIS — I2510 Atherosclerotic heart disease of native coronary artery without angina pectoris: Secondary | ICD-10-CM | POA: Diagnosis not present

## 2022-08-24 DIAGNOSIS — R7303 Prediabetes: Secondary | ICD-10-CM | POA: Diagnosis not present

## 2022-08-24 DIAGNOSIS — I4821 Permanent atrial fibrillation: Secondary | ICD-10-CM | POA: Diagnosis not present

## 2022-08-24 DIAGNOSIS — L0390 Cellulitis, unspecified: Secondary | ICD-10-CM | POA: Diagnosis not present

## 2022-08-24 DIAGNOSIS — I739 Peripheral vascular disease, unspecified: Secondary | ICD-10-CM | POA: Diagnosis not present

## 2022-08-24 DIAGNOSIS — Z9181 History of falling: Secondary | ICD-10-CM | POA: Diagnosis not present

## 2022-08-24 DIAGNOSIS — G629 Polyneuropathy, unspecified: Secondary | ICD-10-CM | POA: Diagnosis not present

## 2022-08-24 DIAGNOSIS — E785 Hyperlipidemia, unspecified: Secondary | ICD-10-CM | POA: Diagnosis not present

## 2022-08-24 DIAGNOSIS — N189 Chronic kidney disease, unspecified: Secondary | ICD-10-CM | POA: Diagnosis not present

## 2022-08-24 DIAGNOSIS — Z7901 Long term (current) use of anticoagulants: Secondary | ICD-10-CM | POA: Diagnosis not present

## 2022-08-24 DIAGNOSIS — I5030 Unspecified diastolic (congestive) heart failure: Secondary | ICD-10-CM | POA: Diagnosis not present

## 2022-08-24 DIAGNOSIS — I872 Venous insufficiency (chronic) (peripheral): Secondary | ICD-10-CM | POA: Diagnosis not present

## 2022-08-24 DIAGNOSIS — Z955 Presence of coronary angioplasty implant and graft: Secondary | ICD-10-CM | POA: Diagnosis not present

## 2022-08-24 DIAGNOSIS — I272 Pulmonary hypertension, unspecified: Secondary | ICD-10-CM | POA: Diagnosis not present

## 2022-08-24 DIAGNOSIS — D631 Anemia in chronic kidney disease: Secondary | ICD-10-CM | POA: Diagnosis not present

## 2022-08-24 DIAGNOSIS — I130 Hypertensive heart and chronic kidney disease with heart failure and stage 1 through stage 4 chronic kidney disease, or unspecified chronic kidney disease: Secondary | ICD-10-CM | POA: Diagnosis not present

## 2022-09-06 DIAGNOSIS — G629 Polyneuropathy, unspecified: Secondary | ICD-10-CM | POA: Diagnosis not present

## 2022-09-06 DIAGNOSIS — I2510 Atherosclerotic heart disease of native coronary artery without angina pectoris: Secondary | ICD-10-CM | POA: Diagnosis not present

## 2022-09-06 DIAGNOSIS — I272 Pulmonary hypertension, unspecified: Secondary | ICD-10-CM | POA: Diagnosis not present

## 2022-09-06 DIAGNOSIS — R7303 Prediabetes: Secondary | ICD-10-CM | POA: Diagnosis not present

## 2022-09-06 DIAGNOSIS — I130 Hypertensive heart and chronic kidney disease with heart failure and stage 1 through stage 4 chronic kidney disease, or unspecified chronic kidney disease: Secondary | ICD-10-CM | POA: Diagnosis not present

## 2022-09-06 DIAGNOSIS — N189 Chronic kidney disease, unspecified: Secondary | ICD-10-CM | POA: Diagnosis not present

## 2022-09-06 DIAGNOSIS — Z9181 History of falling: Secondary | ICD-10-CM | POA: Diagnosis not present

## 2022-09-06 DIAGNOSIS — I739 Peripheral vascular disease, unspecified: Secondary | ICD-10-CM | POA: Diagnosis not present

## 2022-09-06 DIAGNOSIS — I4821 Permanent atrial fibrillation: Secondary | ICD-10-CM | POA: Diagnosis not present

## 2022-09-06 DIAGNOSIS — Z955 Presence of coronary angioplasty implant and graft: Secondary | ICD-10-CM | POA: Diagnosis not present

## 2022-09-06 DIAGNOSIS — I5030 Unspecified diastolic (congestive) heart failure: Secondary | ICD-10-CM | POA: Diagnosis not present

## 2022-09-06 DIAGNOSIS — D631 Anemia in chronic kidney disease: Secondary | ICD-10-CM | POA: Diagnosis not present

## 2022-09-06 DIAGNOSIS — L0390 Cellulitis, unspecified: Secondary | ICD-10-CM | POA: Diagnosis not present

## 2022-09-06 DIAGNOSIS — E785 Hyperlipidemia, unspecified: Secondary | ICD-10-CM | POA: Diagnosis not present

## 2022-09-06 DIAGNOSIS — I872 Venous insufficiency (chronic) (peripheral): Secondary | ICD-10-CM | POA: Diagnosis not present

## 2022-09-06 DIAGNOSIS — Z7901 Long term (current) use of anticoagulants: Secondary | ICD-10-CM | POA: Diagnosis not present

## 2022-09-07 DIAGNOSIS — I5030 Unspecified diastolic (congestive) heart failure: Secondary | ICD-10-CM | POA: Diagnosis not present

## 2022-09-07 DIAGNOSIS — N189 Chronic kidney disease, unspecified: Secondary | ICD-10-CM | POA: Diagnosis not present

## 2022-09-07 DIAGNOSIS — D631 Anemia in chronic kidney disease: Secondary | ICD-10-CM | POA: Diagnosis not present

## 2022-09-07 DIAGNOSIS — R7303 Prediabetes: Secondary | ICD-10-CM | POA: Diagnosis not present

## 2022-09-07 DIAGNOSIS — I130 Hypertensive heart and chronic kidney disease with heart failure and stage 1 through stage 4 chronic kidney disease, or unspecified chronic kidney disease: Secondary | ICD-10-CM | POA: Diagnosis not present

## 2022-09-07 DIAGNOSIS — Z9181 History of falling: Secondary | ICD-10-CM | POA: Diagnosis not present

## 2022-09-07 DIAGNOSIS — I4821 Permanent atrial fibrillation: Secondary | ICD-10-CM | POA: Diagnosis not present

## 2022-09-07 DIAGNOSIS — E785 Hyperlipidemia, unspecified: Secondary | ICD-10-CM | POA: Diagnosis not present

## 2022-09-07 DIAGNOSIS — G629 Polyneuropathy, unspecified: Secondary | ICD-10-CM | POA: Diagnosis not present

## 2022-09-07 DIAGNOSIS — I272 Pulmonary hypertension, unspecified: Secondary | ICD-10-CM | POA: Diagnosis not present

## 2022-09-07 DIAGNOSIS — I872 Venous insufficiency (chronic) (peripheral): Secondary | ICD-10-CM | POA: Diagnosis not present

## 2022-09-07 DIAGNOSIS — Z955 Presence of coronary angioplasty implant and graft: Secondary | ICD-10-CM | POA: Diagnosis not present

## 2022-09-07 DIAGNOSIS — I2510 Atherosclerotic heart disease of native coronary artery without angina pectoris: Secondary | ICD-10-CM | POA: Diagnosis not present

## 2022-09-07 DIAGNOSIS — Z7901 Long term (current) use of anticoagulants: Secondary | ICD-10-CM | POA: Diagnosis not present

## 2022-09-07 DIAGNOSIS — I739 Peripheral vascular disease, unspecified: Secondary | ICD-10-CM | POA: Diagnosis not present

## 2022-09-07 DIAGNOSIS — L0390 Cellulitis, unspecified: Secondary | ICD-10-CM | POA: Diagnosis not present

## 2022-09-17 DIAGNOSIS — Z7901 Long term (current) use of anticoagulants: Secondary | ICD-10-CM | POA: Diagnosis not present

## 2022-09-17 DIAGNOSIS — I5030 Unspecified diastolic (congestive) heart failure: Secondary | ICD-10-CM | POA: Diagnosis not present

## 2022-09-17 DIAGNOSIS — Z9181 History of falling: Secondary | ICD-10-CM | POA: Diagnosis not present

## 2022-09-17 DIAGNOSIS — I872 Venous insufficiency (chronic) (peripheral): Secondary | ICD-10-CM | POA: Diagnosis not present

## 2022-09-17 DIAGNOSIS — I2510 Atherosclerotic heart disease of native coronary artery without angina pectoris: Secondary | ICD-10-CM | POA: Diagnosis not present

## 2022-09-17 DIAGNOSIS — I130 Hypertensive heart and chronic kidney disease with heart failure and stage 1 through stage 4 chronic kidney disease, or unspecified chronic kidney disease: Secondary | ICD-10-CM | POA: Diagnosis not present

## 2022-09-17 DIAGNOSIS — L0390 Cellulitis, unspecified: Secondary | ICD-10-CM | POA: Diagnosis not present

## 2022-09-17 DIAGNOSIS — I4821 Permanent atrial fibrillation: Secondary | ICD-10-CM | POA: Diagnosis not present

## 2022-09-17 DIAGNOSIS — E785 Hyperlipidemia, unspecified: Secondary | ICD-10-CM | POA: Diagnosis not present

## 2022-09-17 DIAGNOSIS — R7303 Prediabetes: Secondary | ICD-10-CM | POA: Diagnosis not present

## 2022-09-17 DIAGNOSIS — Z955 Presence of coronary angioplasty implant and graft: Secondary | ICD-10-CM | POA: Diagnosis not present

## 2022-09-17 DIAGNOSIS — I272 Pulmonary hypertension, unspecified: Secondary | ICD-10-CM | POA: Diagnosis not present

## 2022-09-17 DIAGNOSIS — N189 Chronic kidney disease, unspecified: Secondary | ICD-10-CM | POA: Diagnosis not present

## 2022-09-17 DIAGNOSIS — D631 Anemia in chronic kidney disease: Secondary | ICD-10-CM | POA: Diagnosis not present

## 2022-09-17 DIAGNOSIS — G629 Polyneuropathy, unspecified: Secondary | ICD-10-CM | POA: Diagnosis not present

## 2022-09-17 DIAGNOSIS — I739 Peripheral vascular disease, unspecified: Secondary | ICD-10-CM | POA: Diagnosis not present

## 2022-09-20 ENCOUNTER — Encounter: Payer: Self-pay | Admitting: Podiatry

## 2022-09-20 ENCOUNTER — Ambulatory Visit (INDEPENDENT_AMBULATORY_CARE_PROVIDER_SITE_OTHER): Payer: Medicare HMO | Admitting: Podiatry

## 2022-09-20 VITALS — BP 144/56 | HR 63

## 2022-09-20 DIAGNOSIS — M79674 Pain in right toe(s): Secondary | ICD-10-CM

## 2022-09-20 DIAGNOSIS — B351 Tinea unguium: Secondary | ICD-10-CM | POA: Diagnosis not present

## 2022-09-20 DIAGNOSIS — N183 Chronic kidney disease, stage 3 unspecified: Secondary | ICD-10-CM

## 2022-09-20 DIAGNOSIS — M79675 Pain in left toe(s): Secondary | ICD-10-CM

## 2022-09-20 NOTE — Progress Notes (Signed)
This patient returns to my office for at risk foot care.  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  This patient is unable to cut nails himself since the patient cannot reach his nails.These nails are painful walking and wearing shoes.  This patient presents for at risk foot care today.  General Appearance  Alert, conversant and in no acute stress.  Vascular  Dorsalis pedis and posterior tibial  pulses are absent  bilaterally.  Capillary return is within normal limits  bilaterally. Cold feet.  Bilaterally.  Venous disease both feet/legs.  Neurologic  Senn-Weinstein monofilament wire test within normal limits  bilaterally. Muscle power within normal limits bilaterally.  Nails Thick disfigured discolored nails with subungual debris  from hallux to fifth toes bilaterally. No evidence of bacterial infection or drainage bilaterally.  Orthopedic  No limitations of motion  feet .  No crepitus or effusions noted.  No bony pathology or digital deformities noted.  Ankle arthritis.  Skin  normotropic skin with no porokeratosis noted bilaterally.  No signs of infections or ulcers noted.     Onychomycosis  Pain in right toes  Pain in left toes  Consent was obtained for treatment procedures.   Mechanical debridement of nails 1-5  bilaterally performed with a nail nipper.  Filed with dremel without incident.    Return office visit  3 months                    Told patient to return for periodic foot care and evaluation due to potential at risk complications.   Gardiner Barefoot DPM

## 2022-09-21 DIAGNOSIS — I272 Pulmonary hypertension, unspecified: Secondary | ICD-10-CM | POA: Diagnosis not present

## 2022-09-21 DIAGNOSIS — E785 Hyperlipidemia, unspecified: Secondary | ICD-10-CM | POA: Diagnosis not present

## 2022-09-21 DIAGNOSIS — Z7901 Long term (current) use of anticoagulants: Secondary | ICD-10-CM | POA: Diagnosis not present

## 2022-09-21 DIAGNOSIS — I872 Venous insufficiency (chronic) (peripheral): Secondary | ICD-10-CM | POA: Diagnosis not present

## 2022-09-21 DIAGNOSIS — D631 Anemia in chronic kidney disease: Secondary | ICD-10-CM | POA: Diagnosis not present

## 2022-09-21 DIAGNOSIS — N189 Chronic kidney disease, unspecified: Secondary | ICD-10-CM | POA: Diagnosis not present

## 2022-09-21 DIAGNOSIS — L0390 Cellulitis, unspecified: Secondary | ICD-10-CM | POA: Diagnosis not present

## 2022-09-21 DIAGNOSIS — I4821 Permanent atrial fibrillation: Secondary | ICD-10-CM | POA: Diagnosis not present

## 2022-09-21 DIAGNOSIS — G629 Polyneuropathy, unspecified: Secondary | ICD-10-CM | POA: Diagnosis not present

## 2022-09-21 DIAGNOSIS — I2510 Atherosclerotic heart disease of native coronary artery without angina pectoris: Secondary | ICD-10-CM | POA: Diagnosis not present

## 2022-09-21 DIAGNOSIS — Z955 Presence of coronary angioplasty implant and graft: Secondary | ICD-10-CM | POA: Diagnosis not present

## 2022-09-21 DIAGNOSIS — I5030 Unspecified diastolic (congestive) heart failure: Secondary | ICD-10-CM | POA: Diagnosis not present

## 2022-09-21 DIAGNOSIS — R7303 Prediabetes: Secondary | ICD-10-CM | POA: Diagnosis not present

## 2022-09-21 DIAGNOSIS — I739 Peripheral vascular disease, unspecified: Secondary | ICD-10-CM | POA: Diagnosis not present

## 2022-09-21 DIAGNOSIS — Z9181 History of falling: Secondary | ICD-10-CM | POA: Diagnosis not present

## 2022-09-21 DIAGNOSIS — I130 Hypertensive heart and chronic kidney disease with heart failure and stage 1 through stage 4 chronic kidney disease, or unspecified chronic kidney disease: Secondary | ICD-10-CM | POA: Diagnosis not present

## 2022-09-27 DIAGNOSIS — I739 Peripheral vascular disease, unspecified: Secondary | ICD-10-CM | POA: Diagnosis not present

## 2022-09-27 DIAGNOSIS — R7303 Prediabetes: Secondary | ICD-10-CM | POA: Diagnosis not present

## 2022-09-27 DIAGNOSIS — I5030 Unspecified diastolic (congestive) heart failure: Secondary | ICD-10-CM | POA: Diagnosis not present

## 2022-09-27 DIAGNOSIS — I272 Pulmonary hypertension, unspecified: Secondary | ICD-10-CM | POA: Diagnosis not present

## 2022-09-27 DIAGNOSIS — I2510 Atherosclerotic heart disease of native coronary artery without angina pectoris: Secondary | ICD-10-CM | POA: Diagnosis not present

## 2022-09-27 DIAGNOSIS — Z9181 History of falling: Secondary | ICD-10-CM | POA: Diagnosis not present

## 2022-09-27 DIAGNOSIS — Z955 Presence of coronary angioplasty implant and graft: Secondary | ICD-10-CM | POA: Diagnosis not present

## 2022-09-27 DIAGNOSIS — D631 Anemia in chronic kidney disease: Secondary | ICD-10-CM | POA: Diagnosis not present

## 2022-09-27 DIAGNOSIS — Z7901 Long term (current) use of anticoagulants: Secondary | ICD-10-CM | POA: Diagnosis not present

## 2022-09-27 DIAGNOSIS — I4821 Permanent atrial fibrillation: Secondary | ICD-10-CM | POA: Diagnosis not present

## 2022-09-27 DIAGNOSIS — I872 Venous insufficiency (chronic) (peripheral): Secondary | ICD-10-CM | POA: Diagnosis not present

## 2022-09-27 DIAGNOSIS — N189 Chronic kidney disease, unspecified: Secondary | ICD-10-CM | POA: Diagnosis not present

## 2022-09-27 DIAGNOSIS — I130 Hypertensive heart and chronic kidney disease with heart failure and stage 1 through stage 4 chronic kidney disease, or unspecified chronic kidney disease: Secondary | ICD-10-CM | POA: Diagnosis not present

## 2022-09-27 DIAGNOSIS — E785 Hyperlipidemia, unspecified: Secondary | ICD-10-CM | POA: Diagnosis not present

## 2022-09-27 DIAGNOSIS — L0390 Cellulitis, unspecified: Secondary | ICD-10-CM | POA: Diagnosis not present

## 2022-09-27 DIAGNOSIS — G629 Polyneuropathy, unspecified: Secondary | ICD-10-CM | POA: Diagnosis not present

## 2022-10-04 ENCOUNTER — Other Ambulatory Visit: Payer: Self-pay | Admitting: Family

## 2022-10-04 DIAGNOSIS — M7989 Other specified soft tissue disorders: Secondary | ICD-10-CM

## 2022-10-25 DIAGNOSIS — I4891 Unspecified atrial fibrillation: Secondary | ICD-10-CM | POA: Diagnosis not present

## 2022-10-25 DIAGNOSIS — K219 Gastro-esophageal reflux disease without esophagitis: Secondary | ICD-10-CM | POA: Diagnosis not present

## 2022-10-25 DIAGNOSIS — J439 Emphysema, unspecified: Secondary | ICD-10-CM | POA: Diagnosis not present

## 2022-10-25 DIAGNOSIS — K5900 Constipation, unspecified: Secondary | ICD-10-CM | POA: Diagnosis not present

## 2022-10-25 DIAGNOSIS — I2510 Atherosclerotic heart disease of native coronary artery without angina pectoris: Secondary | ICD-10-CM | POA: Diagnosis not present

## 2022-10-25 DIAGNOSIS — N189 Chronic kidney disease, unspecified: Secondary | ICD-10-CM | POA: Diagnosis not present

## 2022-10-25 DIAGNOSIS — R269 Unspecified abnormalities of gait and mobility: Secondary | ICD-10-CM | POA: Diagnosis not present

## 2022-10-25 DIAGNOSIS — I739 Peripheral vascular disease, unspecified: Secondary | ICD-10-CM | POA: Diagnosis not present

## 2022-10-25 DIAGNOSIS — M1990 Unspecified osteoarthritis, unspecified site: Secondary | ICD-10-CM | POA: Diagnosis not present

## 2022-10-25 DIAGNOSIS — E785 Hyperlipidemia, unspecified: Secondary | ICD-10-CM | POA: Diagnosis not present

## 2022-10-25 DIAGNOSIS — I509 Heart failure, unspecified: Secondary | ICD-10-CM | POA: Diagnosis not present

## 2022-10-25 DIAGNOSIS — E611 Iron deficiency: Secondary | ICD-10-CM | POA: Diagnosis not present

## 2022-11-05 ENCOUNTER — Encounter: Payer: Self-pay | Admitting: Cardiovascular Disease

## 2022-11-05 DIAGNOSIS — I4811 Longstanding persistent atrial fibrillation: Secondary | ICD-10-CM

## 2022-11-05 MED ORDER — RIVAROXABAN 15 MG PO TABS: 15.0000 mg | ORAL_TABLET | Freq: Two times a day (BID) | ORAL | 1 refills | Status: DC

## 2022-11-05 NOTE — Telephone Encounter (Signed)
Per Karren Cobble, PharmD, dose should be decreased to '15mg'$ . Called pt and made him aware of dose change. Pt verbalized understanding. Appropriate dose and refill sent.

## 2022-11-05 NOTE — Telephone Encounter (Addendum)
Prescription refill request for Xarelto received.  Indication: Afib  Last office visit: 06/11/22 Rockey Situ)  Weight: 78kg Age: 82 Scr:  1.42 (06/04/22)  CrCl: 46.11m/min  Per dosing criteria, pt's dose is not appropriate. Will sent to PharmD for review.

## 2022-11-10 ENCOUNTER — Other Ambulatory Visit: Payer: Self-pay | Admitting: Cardiovascular Disease

## 2022-11-10 DIAGNOSIS — I4811 Longstanding persistent atrial fibrillation: Secondary | ICD-10-CM

## 2022-11-12 NOTE — Telephone Encounter (Signed)
Prescription refill request for Xarelto received.  Indication: afib  Last office visit: Gollan, 06/11/2022 Weight: 78 kg  Age: 82 yo  Scr: 1.42, 06/04/2022 CrCl: 47 ml/min     Verfied with Rutha Bouchard D, will send in prescription for xarelto '15mg'$  1 tablet daily.

## 2022-11-12 NOTE — Telephone Encounter (Signed)
Refill request

## 2022-11-26 ENCOUNTER — Ambulatory Visit: Payer: Medicare HMO | Admitting: Family

## 2022-12-03 ENCOUNTER — Encounter (INDEPENDENT_AMBULATORY_CARE_PROVIDER_SITE_OTHER): Payer: Medicaid Other | Admitting: Vascular Surgery

## 2022-12-05 ENCOUNTER — Encounter: Payer: Self-pay | Admitting: Family

## 2022-12-05 ENCOUNTER — Ambulatory Visit (INDEPENDENT_AMBULATORY_CARE_PROVIDER_SITE_OTHER): Payer: Medicare HMO | Admitting: Family

## 2022-12-05 ENCOUNTER — Other Ambulatory Visit: Payer: Self-pay | Admitting: Family

## 2022-12-05 VITALS — BP 142/86 | HR 71 | Temp 98.6°F | Ht 69.0 in | Wt 167.8 lb

## 2022-12-05 DIAGNOSIS — R7309 Other abnormal glucose: Secondary | ICD-10-CM

## 2022-12-05 DIAGNOSIS — D649 Anemia, unspecified: Secondary | ICD-10-CM

## 2022-12-05 DIAGNOSIS — G629 Polyneuropathy, unspecified: Secondary | ICD-10-CM | POA: Diagnosis not present

## 2022-12-05 DIAGNOSIS — M7989 Other specified soft tissue disorders: Secondary | ICD-10-CM

## 2022-12-05 LAB — CBC WITH DIFFERENTIAL/PLATELET
Basophils Absolute: 0 10*3/uL (ref 0.0–0.1)
Basophils Relative: 0.6 % (ref 0.0–3.0)
Eosinophils Absolute: 0.1 10*3/uL (ref 0.0–0.7)
Eosinophils Relative: 1.8 % (ref 0.0–5.0)
HCT: 36.9 % — ABNORMAL LOW (ref 39.0–52.0)
Hemoglobin: 12.5 g/dL — ABNORMAL LOW (ref 13.0–17.0)
Lymphocytes Relative: 19.4 % (ref 12.0–46.0)
Lymphs Abs: 0.9 10*3/uL (ref 0.7–4.0)
MCHC: 34 g/dL (ref 30.0–36.0)
MCV: 96.5 fl (ref 78.0–100.0)
Monocytes Absolute: 0.4 10*3/uL (ref 0.1–1.0)
Monocytes Relative: 9 % (ref 3.0–12.0)
Neutro Abs: 3.2 10*3/uL (ref 1.4–7.7)
Neutrophils Relative %: 69.2 % (ref 43.0–77.0)
Platelets: 108 10*3/uL — ABNORMAL LOW (ref 150.0–400.0)
RBC: 3.82 Mil/uL — ABNORMAL LOW (ref 4.22–5.81)
RDW: 15.6 % — ABNORMAL HIGH (ref 11.5–15.5)
WBC: 4.6 10*3/uL (ref 4.0–10.5)

## 2022-12-05 LAB — URINALYSIS, ROUTINE W REFLEX MICROSCOPIC
Bilirubin Urine: NEGATIVE
Ketones, ur: NEGATIVE
Nitrite: NEGATIVE
Specific Gravity, Urine: 1.01 (ref 1.000–1.030)
Total Protein, Urine: 30 — AB
Urine Glucose: NEGATIVE
Urobilinogen, UA: 0.2 (ref 0.0–1.0)
pH: 7.5 (ref 5.0–8.0)

## 2022-12-05 LAB — POCT GLYCOSYLATED HEMOGLOBIN (HGB A1C): Hemoglobin A1C: 5.6 % (ref 4.0–5.6)

## 2022-12-05 LAB — COMPREHENSIVE METABOLIC PANEL
ALT: 12 U/L (ref 0–53)
AST: 23 U/L (ref 0–37)
Albumin: 4.5 g/dL (ref 3.5–5.2)
Alkaline Phosphatase: 82 U/L (ref 39–117)
BUN: 36 mg/dL — ABNORMAL HIGH (ref 6–23)
CO2: 29 mEq/L (ref 19–32)
Calcium: 9.9 mg/dL (ref 8.4–10.5)
Chloride: 93 mEq/L — ABNORMAL LOW (ref 96–112)
Creatinine, Ser: 1.37 mg/dL (ref 0.40–1.50)
GFR: 49.11 mL/min — ABNORMAL LOW (ref >60.00)
Glucose, Bld: 92 mg/dL (ref 70–99)
Potassium: 4.1 mEq/L (ref 3.5–5.1)
Sodium: 130 mEq/L — ABNORMAL LOW (ref 135–145)
Total Bilirubin: 1.9 mg/dL — ABNORMAL HIGH (ref 0.2–1.2)
Total Protein: 8.7 g/dL — ABNORMAL HIGH (ref 6.0–8.3)

## 2022-12-05 LAB — B12 AND FOLATE PANEL
Folate: 8 ng/mL (ref >5.9)
Vitamin B-12: 1010 pg/mL — ABNORMAL HIGH (ref 211–911)

## 2022-12-05 LAB — IBC + FERRITIN
Ferritin: 85.2 ng/mL (ref 22.0–322.0)
Iron: 74 ug/dL (ref 42–165)
Saturation Ratios: 16.4 % — ABNORMAL LOW (ref 20.0–50.0)
TIBC: 450.8 ug/dL — ABNORMAL HIGH (ref 250.0–450.0)
Transferrin: 322 mg/dL (ref 212.0–360.0)

## 2022-12-05 MED ORDER — GABAPENTIN 100 MG PO CAPS: 100.0000 mg | ORAL_CAPSULE | Freq: Every day | ORAL | 1 refills | Status: DC

## 2022-12-05 MED ORDER — TORSEMIDE 20 MG PO TABS: 40.0000 mg | ORAL_TABLET | Freq: Two times a day (BID) | ORAL | 0 refills | Status: DC

## 2022-12-05 NOTE — Assessment & Plan Note (Signed)
Idiopathic. He is not diabetic.  No low back pain.  Diminished pedal pulses on exam today, ordering ABI study. Start gabapentin and titrate.

## 2022-12-05 NOTE — Patient Instructions (Addendum)
I have sent in gabapentin 100 mg tablet.  Please start by taking this medication at bedtime.  After 3 to 4 days if ineffective he may increase from 100 mg to 200 mg at bedtime.  As discussed, most common side effect is sedation.  You may also purchase over-the-counter capsaicin which is used for neuropathy.  Please ensure that you use a small dose to start with please wash your hands vigorously after application.

## 2022-12-05 NOTE — Progress Notes (Unsigned)
   Assessment & Plan:  There are no diagnoses linked to this encounter.   Return precautions given.   Risks, benefits, and alternatives of the medications and treatment plan prescribed today were discussed, and patient expressed understanding.   Education regarding symptom management and diagnosis given to patient on AVS either electronically or printed.  No follow-ups on file.  Jordan Paris, FNP  Subjective:    Patient ID: Jordan Perkins, male    DOB: Nov 20, 1940, 81 y.o.   MRN: [id number]  CC: Jordan Perkins is a 82 y.o. male who presents today for follow up.   HPI: Accompanied by niece  He lives with his brother.   He complains of bilateral     He does not ambulate. He sitting his chair most of the day aside from a little preparation of food.   Compliant with torsemide 40mg  BID. No leg swelling No falls.   B12 1054 DG lumbar spine 02/14/22 Mild osteophytic changes are seen. Disc space narrowing is noted at L4-5 and L5-S1 Follow-up cardiology, Dr. Rockey Situ 06/11/2022 for longstanding persistent atrial fibrillation, chronic diastolic congestive heart failure. At that time recommend to continue torsemide 40 mg BID to let swelling   Allergies: Sulfa antibiotics Current Outpatient Medications on File Prior to Visit  Medication Sig Dispense Refill   atorvastatin (LIPITOR) 80 MG tablet TAKE 1 TABLET BY MOUTH EVERY DAY 90 tablet 1   ferrous sulfate 325 (65 FE) MG tablet Take 1 tablet (325 mg total) by mouth daily. 90 tablet 1   pantoprazole (PROTONIX) 40 MG tablet Take 1 tablet (40 mg total) by mouth daily. 90 tablet 2   Rivaroxaban (XARELTO) 15 MG TABS tablet Take 1 tablet (15 mg total) by mouth daily with supper. 90 tablet 0   sildenafil (REVATIO) 20 MG tablet Take 1 tablet (20 mg total) by mouth 3 (three) times daily. 90 tablet 1   torsemide (DEMADEX) 20 MG tablet TAKE 2 TABLETS BY MOUTH TWICE A DAY 360 tablet 0   No current facility-administered medications on  file prior to visit.    Review of Systems    Objective:    BP (!) 142/86   Pulse 71   Temp 98.6 F (37 C) (Oral)   Ht 5\' 9"  (1.753 m)   Wt 167 lb 12.8 oz (76.1 kg)   SpO2 98%   BMI 24.78 kg/m  BP Readings from Last 3 Encounters:  12/05/22 (!) 142/86  09/20/22 (!) 144/56  06/11/22 (!) 140/60   Wt Readings from Last 3 Encounters:  12/05/22 167 lb 12.8 oz (76.1 kg)  06/11/22 172 lb (78 kg)  06/04/22 172 lb (78 kg)     Physical Exam

## 2022-12-06 ENCOUNTER — Other Ambulatory Visit: Payer: Self-pay | Admitting: Family

## 2022-12-06 ENCOUNTER — Encounter: Payer: Self-pay | Admitting: Family

## 2022-12-06 ENCOUNTER — Encounter: Payer: Self-pay | Admitting: Podiatry

## 2022-12-06 ENCOUNTER — Other Ambulatory Visit: Payer: Self-pay

## 2022-12-06 ENCOUNTER — Ambulatory Visit (INDEPENDENT_AMBULATORY_CARE_PROVIDER_SITE_OTHER): Payer: Medicare HMO | Admitting: Podiatry

## 2022-12-06 ENCOUNTER — Telehealth: Payer: Self-pay | Admitting: Family Medicine

## 2022-12-06 ENCOUNTER — Telehealth: Payer: Self-pay | Admitting: Family

## 2022-12-06 DIAGNOSIS — M79675 Pain in left toe(s): Secondary | ICD-10-CM | POA: Diagnosis not present

## 2022-12-06 DIAGNOSIS — B351 Tinea unguium: Secondary | ICD-10-CM | POA: Diagnosis not present

## 2022-12-06 DIAGNOSIS — M79674 Pain in right toe(s): Secondary | ICD-10-CM

## 2022-12-06 DIAGNOSIS — Z8719 Personal history of other diseases of the digestive system: Secondary | ICD-10-CM

## 2022-12-06 DIAGNOSIS — E871 Hypo-osmolality and hyponatremia: Secondary | ICD-10-CM

## 2022-12-06 DIAGNOSIS — N183 Chronic kidney disease, stage 3 unspecified: Secondary | ICD-10-CM

## 2022-12-06 DIAGNOSIS — R319 Hematuria, unspecified: Secondary | ICD-10-CM

## 2022-12-06 NOTE — Progress Notes (Signed)
This patient returns to my office for at risk foot care.  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  This patient is unable to cut nails himself since the patient cannot reach his nails.These nails are painful walking and wearing shoes.  Patient presents to the office with his daughter.This patient presents for at risk foot care today.  General Appearance  Alert, conversant and in no acute stress.  Vascular  Dorsalis pedis and posterior tibial  pulses are absent  bilaterally.  Capillary return is within normal limits  bilaterally. Cold feet.  Bilaterally.  Venous disease both feet/legs.  Neurologic  Senn-Weinstein monofilament wire test within normal limits  bilaterally. Muscle power within normal limits bilaterally.  Nails Thick disfigured discolored nails with subungual debris  from hallux to fifth toes bilaterally. No evidence of bacterial infection or drainage bilaterally.  Orthopedic  No limitations of motion  feet .  No crepitus or effusions noted.  No bony pathology or digital deformities noted.  Ankle arthritis.  Skin  normotropic skin with no porokeratosis noted bilaterally.  No signs of infections or ulcers noted.     Onychomycosis  Pain in right toes  Pain in left toes  Consent was obtained for treatment procedures.   Mechanical debridement of nails 1-5  bilaterally performed with a nail nipper.  Filed with dremel without incident.    Return office visit 10 weeks                   Told patient to return for periodic foot care and evaluation due to potential at risk complications.   Gardiner Barefoot DPM

## 2022-12-06 NOTE — Progress Notes (Signed)
close

## 2022-12-06 NOTE — Telephone Encounter (Signed)
FYI- I called pt to schedule the vas [place] order, per niece she states pt is not going to get that done. Thank you!

## 2022-12-10 NOTE — Telephone Encounter (Signed)
close

## 2022-12-12 NOTE — Telephone Encounter (Signed)
Noted  

## 2022-12-13 ENCOUNTER — Telehealth: Payer: Self-pay | Admitting: Family Medicine

## 2022-12-14 NOTE — Telephone Encounter (Signed)
Error

## 2023-01-02 ENCOUNTER — Telehealth: Payer: Self-pay | Admitting: Family Medicine

## 2023-01-02 DIAGNOSIS — M7989 Other specified soft tissue disorders: Secondary | ICD-10-CM

## 2023-01-02 NOTE — Telephone Encounter (Signed)
Prescription Request  01/02/2023  LOV: 02/13/2022  What is the name of the medication or equipment? torsemide (DEMADEX) 20 MG tablet and pantoprazole (PROTONIX) 40 MG tablet   Have you contacted your pharmacy to request a refill? No   Which pharmacy would you like this sent to?  [id number] - [place], [place] - [address] [place] [address] Phone: [phone] Fax: [phone]    Patient notified that their request is being sent to the clinical staff for review and that they should receive a response within 2 business days.   Please advise at [facility] [phone]

## 2023-01-04 MED ORDER — PANTOPRAZOLE SODIUM 40 MG PO TBEC: 40.0000 mg | DELAYED_RELEASE_TABLET | Freq: Every day | ORAL | 2 refills | Status: DC

## 2023-01-04 MED ORDER — TORSEMIDE 20 MG PO TABS: 40.0000 mg | ORAL_TABLET | Freq: Two times a day (BID) | ORAL | 0 refills | Status: DC

## 2023-01-04 NOTE — Addendum Note (Signed)
Addended by: Benedict Needy on: 01/04/2023 04:31 PM   Modules accepted: Orders

## 2023-01-07 ENCOUNTER — Ambulatory Visit: Payer: Medicare HMO | Admitting: Family Medicine

## 2023-01-08 ENCOUNTER — Ambulatory Visit: Payer: Medicare HMO | Admitting: Family Medicine

## 2023-01-30 ENCOUNTER — Other Ambulatory Visit: Payer: Self-pay

## 2023-01-30 ENCOUNTER — Encounter: Payer: Self-pay | Admitting: Family Medicine

## 2023-01-30 DIAGNOSIS — G629 Polyneuropathy, unspecified: Secondary | ICD-10-CM

## 2023-01-30 MED ORDER — GABAPENTIN 100 MG PO CAPS: 100.0000 mg | ORAL_CAPSULE | Freq: Every day | ORAL | 1 refills | Status: DC

## 2023-02-02 ENCOUNTER — Other Ambulatory Visit: Payer: Self-pay | Admitting: Cardiovascular Disease

## 2023-02-02 DIAGNOSIS — I4811 Longstanding persistent atrial fibrillation: Secondary | ICD-10-CM

## 2023-02-05 NOTE — Telephone Encounter (Signed)
Pt last saw Dr Mariah Milling 06/11/22, last labs 12/05/22 Creat 1.37, age 82, weight 76.1kg, CrCl 47.06, based on CrCl pt is on appropriate dosage of Xarelto 15mg  QD for afib.  Will refill rx.

## 2023-02-05 NOTE — Telephone Encounter (Signed)
Refill request

## 2023-02-13 ENCOUNTER — Encounter: Payer: Self-pay | Admitting: Family Medicine

## 2023-02-13 ENCOUNTER — Ambulatory Visit (INDEPENDENT_AMBULATORY_CARE_PROVIDER_SITE_OTHER): Payer: Medicare HMO | Admitting: Family Medicine

## 2023-02-13 VITALS — BP 124/76 | HR 52 | Temp 98.1°F | Ht 69.0 in | Wt 161.4 lb

## 2023-02-13 DIAGNOSIS — K7460 Unspecified cirrhosis of liver: Secondary | ICD-10-CM

## 2023-02-13 DIAGNOSIS — D696 Thrombocytopenia, unspecified: Secondary | ICD-10-CM

## 2023-02-13 DIAGNOSIS — G629 Polyneuropathy, unspecified: Secondary | ICD-10-CM | POA: Diagnosis not present

## 2023-02-13 DIAGNOSIS — D638 Anemia in other chronic diseases classified elsewhere: Secondary | ICD-10-CM | POA: Diagnosis not present

## 2023-02-13 DIAGNOSIS — K5900 Constipation, unspecified: Secondary | ICD-10-CM

## 2023-02-13 DIAGNOSIS — Z8719 Personal history of other diseases of the digestive system: Secondary | ICD-10-CM

## 2023-02-13 LAB — CBC
HCT: 35.4 % — ABNORMAL LOW (ref 39.0–52.0)
Hemoglobin: 11.9 g/dL — ABNORMAL LOW (ref 13.0–17.0)
MCHC: 33.7 g/dL (ref 30.0–36.0)
MCV: 97.5 fl (ref 78.0–100.0)
Platelets: 126 10*3/uL — ABNORMAL LOW (ref 150.0–400.0)
RBC: 3.63 Mil/uL — ABNORMAL LOW (ref 4.22–5.81)
RDW: 16 % — ABNORMAL HIGH (ref 11.5–15.5)
WBC: 3.7 10*3/uL — ABNORMAL LOW (ref 4.0–10.5)

## 2023-02-13 LAB — HEPATIC FUNCTION PANEL
ALT: 12 U/L (ref 0–53)
AST: 22 U/L (ref 0–37)
Albumin: 4 g/dL (ref 3.5–5.2)
Alkaline Phosphatase: 73 U/L (ref 39–117)
Bilirubin, Direct: 0.3 mg/dL (ref 0.0–0.3)
Total Bilirubin: 1.4 mg/dL — ABNORMAL HIGH (ref 0.2–1.2)
Total Protein: 8.5 g/dL — ABNORMAL HIGH (ref 6.0–8.3)

## 2023-02-13 MED ORDER — GABAPENTIN 100 MG PO CAPS: 200.0000 mg | ORAL_CAPSULE | Freq: Every day | ORAL | 1 refills | Status: DC

## 2023-02-13 NOTE — Assessment & Plan Note (Signed)
Patient's anemia is chronic.  Has improved recently.  Suspect anemia of chronic disease given iron values.  We will monitor this.

## 2023-02-13 NOTE — Assessment & Plan Note (Addendum)
Chronic issue.  Refer to GI for follow-up.

## 2023-02-13 NOTE — Assessment & Plan Note (Signed)
Chronic issue.  Leg pain is possibly related to neuropathy versus nerve impingement.  Responded quite well to 2 capsules of gabapentin nightly.  He will continue gabapentin 200 mg nightly.  He will monitor for drowsiness, confusion, and gait instability.

## 2023-02-13 NOTE — Progress Notes (Signed)
Marikay Alar, MD Phone: [phone]  Jordan Perkins is a 82 y.o. male who presents today for follow-up.  Anemia/thrombocytopenia: Patient's recent lab work revealed mild anemia that had improved.  He did reveal thrombocytopenia that has been persistent over the last year.  He notes no bleeding issues.  He has not seen hematology in the past.  His niece reports that they have a family history of anemia.  Elevated bilirubin: Elevated recently on labs.  He does report some occasional right upper quadrant pain that is associated with lots of gas.  He does have a history of cirrhosis.  He has a bowel movement daily though it is very loose.  He oftentimes will take milk of magnesia or some kind of stool softener to help have bowel movements.  He takes 1 of those most days.  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  Leg pain: Patient was started on gabapentin 1 tablet nightly previously to help with possible neuropathy.  He notes he increase this to 2 tablets nightly and that was beneficial.  When he was on that dosing he had minimal pain.  He notes pain shoots up from his feet up his legs to his knees.  His knees do pop.  When he has the pain it last 1 to 2 minutes and goes away on its own.  He does report a history of "lumbago."  Social History   Tobacco Use  Smoking Status Never  Smokeless Tobacco Never    Current Outpatient Medications on File Prior to Visit  Medication Sig Dispense Refill   atorvastatin (LIPITOR) 80 MG tablet TAKE 1 TABLET BY MOUTH EVERY DAY 90 tablet 1   ferrous sulfate 325 (65 FE) MG tablet Take 1 tablet (325 mg total) by mouth daily. 90 tablet 1   pantoprazole (PROTONIX) 40 MG tablet Take 1 tablet (40 mg total) by mouth daily. 90 tablet 2   Rivaroxaban (XARELTO) 15 MG TABS tablet TAKE 1 TABLET (15 MG TOTAL) BY MOUTH DAILY WITH SUPPER 90 tablet 1   sildenafil (REVATIO) 20 MG tablet Take  1 tablet (20 mg total) by mouth 3 (three) times daily. 90 tablet 1   torsemide (DEMADEX) 20 MG tablet Take 2 tablets (40 mg total) by mouth 2 (two) times daily. 360 tablet 0   No current facility-administered medications on file prior to visit.     ROS see history of present illness  Objective  Physical Exam Vitals:   02/13/23 1022  BP: 124/76  Pulse: (!) 52  Temp: 98.1 F (36.7 C)  SpO2: 98%    BP Readings from Last 3 Encounters:  02/13/23 124/76  12/05/22 (!) 142/86  09/20/22 (!) 144/56   Wt Readings from Last 3 Encounters:  02/13/23 161 lb 6.4 oz (73.2 kg)  12/05/22 167 lb 12.8 oz (76.1 kg)  06/11/22 172 lb (78 kg)    Physical Exam Constitutional:      General: He is not in acute distress.    Appearance: He is not diaphoretic.  Cardiovascular:     Rate and Rhythm: Normal rate and regular rhythm.     Heart sounds: Normal heart sounds.  Pulmonary:     Effort: Pulmonary effort is normal.     Breath sounds: Normal breath sounds.  Musculoskeletal:     Right lower leg: No edema.     Left lower leg: No edema.  Comments: Lower legs are nontender  Skin:    General: Skin is warm and dry.  Neurological:     Mental Status: He is alert.      Assessment/Plan: Please see individual problem list.  Neuropathy Assessment & Plan: Chronic issue.  Leg pain is possibly related to neuropathy versus nerve impingement.  Responded quite well to 2 capsules of gabapentin nightly.  He will continue gabapentin 200 mg nightly.  He will monitor for drowsiness, confusion, and gait instability.  Orders: -     Gabapentin; Take 2 capsules (200 mg total) by mouth at bedtime.  Dispense: 180 capsule; Refill: 1  Hyperbilirubinemia Assessment & Plan: Check labs today to evaluate for possible underlying cause.  Patient is being referred to GI and likely to hematology depending on what his lab results reveal today.  Orders: -     CBC -     Haptoglobin -     Hepatic function panel -      Reticulocytes  Hepatic cirrhosis, unspecified hepatic cirrhosis type, unspecified whether ascites present [facility]) -     Ambulatory referral to Gastroenterology  History of cirrhosis of liver Assessment & Plan: Chronic issue.  Refer to GI for follow-up.   Constipation, unspecified constipation type Assessment & Plan: Chronic issue.  Patient seems to have gone the opposite direction with loose stools.  Discussed reducing use of stool softeners and milk of magnesia.   Thrombocytopenia (HCC) Assessment & Plan: Chronic issue.  Recheck today.  Has been mildly low.  Discussed the potential for seeing hematology depending on his overall lab workup today.   Anemia of chronic disease Assessment & Plan: Patient's anemia is chronic.  Has improved recently.  Suspect anemia of chronic disease given iron values.  We will monitor this.     Return in about 3 months (around 05/16/2023).   Marikay Alar, MD [facility]

## 2023-02-13 NOTE — Assessment & Plan Note (Signed)
Chronic issue.  Recheck today.  Has been mildly low.  Discussed the potential for seeing hematology depending on his overall lab workup today.

## 2023-02-13 NOTE — Assessment & Plan Note (Signed)
Check labs today to evaluate for possible underlying cause.  Patient is being referred to GI and likely to hematology depending on what his lab results reveal today.

## 2023-02-13 NOTE — Assessment & Plan Note (Signed)
Chronic issue.  Patient seems to have gone the opposite direction with loose stools.  Discussed reducing use of stool softeners and milk of magnesia.

## 2023-02-13 NOTE — Patient Instructions (Signed)
Nice to see you. You can take the gabapentin for your leg pain. GI should contact you to schedule follow-up for your cirrhosis. Will contact you with your lab results.

## 2023-02-14 LAB — HAPTOGLOBIN: Haptoglobin: 65 mg/dL (ref 43–212)

## 2023-02-14 LAB — RETICULOCYTES
ABS Retic: 39710 cells/uL (ref 25000–90000)
Retic Ct Pct: 1.1 %

## 2023-02-18 ENCOUNTER — Ambulatory Visit: Payer: Medicare HMO | Admitting: Podiatry

## 2023-02-23 ENCOUNTER — Other Ambulatory Visit: Payer: Self-pay | Admitting: Family Medicine

## 2023-03-28 ENCOUNTER — Ambulatory Visit: Payer: Medicare HMO | Admitting: Podiatry

## 2023-04-01 ENCOUNTER — Ambulatory Visit: Payer: Medicare HMO | Admitting: Gastroenterology

## 2023-04-25 ENCOUNTER — Encounter (INDEPENDENT_AMBULATORY_CARE_PROVIDER_SITE_OTHER): Payer: Self-pay

## 2023-05-20 ENCOUNTER — Ambulatory Visit: Payer: Medicare HMO | Admitting: Family Medicine

## 2023-05-28 ENCOUNTER — Ambulatory Visit: Payer: Medicare HMO | Admitting: Family Medicine

## 2023-06-03 ENCOUNTER — Ambulatory Visit: Payer: Medicare HMO | Admitting: Podiatry

## 2023-06-04 ENCOUNTER — Encounter: Payer: Self-pay | Admitting: Family Medicine

## 2023-06-04 ENCOUNTER — Ambulatory Visit (INDEPENDENT_AMBULATORY_CARE_PROVIDER_SITE_OTHER): Payer: Medicare HMO | Admitting: Family Medicine

## 2023-06-04 VITALS — BP 144/80 | HR 68 | Temp 97.8°F | Ht 69.0 in | Wt 172.8 lb

## 2023-06-04 DIAGNOSIS — G629 Polyneuropathy, unspecified: Secondary | ICD-10-CM | POA: Diagnosis not present

## 2023-06-04 DIAGNOSIS — E782 Mixed hyperlipidemia: Secondary | ICD-10-CM | POA: Diagnosis not present

## 2023-06-04 DIAGNOSIS — I4811 Longstanding persistent atrial fibrillation: Secondary | ICD-10-CM

## 2023-06-04 DIAGNOSIS — R5381 Other malaise: Secondary | ICD-10-CM

## 2023-06-04 DIAGNOSIS — K5900 Constipation, unspecified: Secondary | ICD-10-CM

## 2023-06-04 DIAGNOSIS — I5032 Chronic diastolic (congestive) heart failure: Secondary | ICD-10-CM

## 2023-06-04 DIAGNOSIS — Z23 Encounter for immunization: Secondary | ICD-10-CM | POA: Diagnosis not present

## 2023-06-04 LAB — COMPREHENSIVE METABOLIC PANEL
ALT: 8 U/L (ref 0–53)
AST: 19 U/L (ref 0–37)
Albumin: 3.8 g/dL (ref 3.5–5.2)
Alkaline Phosphatase: 80 U/L (ref 39–117)
BUN: 35 mg/dL — ABNORMAL HIGH (ref 6–23)
CO2: 28 mEq/L (ref 19–32)
Calcium: 9.3 mg/dL (ref 8.4–10.5)
Chloride: 100 mEq/L (ref 96–112)
Creatinine, Ser: 1.38 mg/dL (ref 0.40–1.50)
GFR: 48.52 mL/min — ABNORMAL LOW (ref >60.00)
Glucose, Bld: 102 mg/dL — ABNORMAL HIGH (ref 70–99)
Potassium: 4.1 mEq/L (ref 3.5–5.1)
Sodium: 135 mEq/L (ref 135–145)
Total Bilirubin: 1.5 mg/dL — ABNORMAL HIGH (ref 0.2–1.2)
Total Protein: 8 g/dL (ref 6.0–8.3)

## 2023-06-04 LAB — LIPID PANEL
Cholesterol: 135 mg/dL (ref 0–200)
HDL: 36.7 mg/dL — ABNORMAL LOW (ref >39.00)
LDL Cholesterol: 80 mg/dL (ref 0–99)
NonHDL: 97.84
Total CHOL/HDL Ratio: 4
Triglycerides: 88 mg/dL (ref 0.0–149.0)
VLDL: 17.6 mg/dL (ref 0.0–40.0)

## 2023-06-04 MED ORDER — RIVAROXABAN 15 MG PO TABS
ORAL_TABLET | ORAL | 1 refills | Status: DC
Start: 2023-06-04 — End: 2023-10-16

## 2023-06-04 MED ORDER — GABAPENTIN 100 MG PO CAPS
200.0000 mg | ORAL_CAPSULE | Freq: Two times a day (BID) | ORAL | 1 refills | Status: AC
Start: 2023-06-04 — End: ?

## 2023-06-04 MED ORDER — POLYETHYLENE GLYCOL 3350 17 GM/SCOOP PO POWD
17.0000 g | Freq: Every day | ORAL | 0 refills | Status: DC | PRN
Start: 2023-06-04 — End: 2023-10-21

## 2023-06-04 NOTE — Assessment & Plan Note (Signed)
Chronic issue.  Patient will start MiraLAX once daily as needed for constipation.  If not improving he will let us know.

## 2023-06-04 NOTE — Assessment & Plan Note (Signed)
Chronic issue.  Check lipid panel.  Continue Lipitor 80 mg daily.

## 2023-06-04 NOTE — Assessment & Plan Note (Signed)
Chronic issue.  Continues to have some issues with this.  We will increase gabapentin to 200 mg twice daily.  He will monitor for drowsiness with this.

## 2023-06-04 NOTE — Progress Notes (Signed)
Marikay Alar, MD Phone: [phone]  Jordan Perkins is a 82 y.o. male who presents today for follow-up.  CHF/A-fib: Patient on Xarelto and torsemide.  No chest pain, shortness of breath, edema, orthopnea, PND, palpitations, or bleeding issues.  Hyperlipidemia: Taking Lipitor.  No right upper quadrant pain or myalgias.  Neuropathy: Patient continues to have some burning and tingling in his bilateral feet.  He denies numbness.  He is aching gabapentin 100 mg in the morning and 100 mg at night.  Leg weakness: Patient reports bilateral leg weakness that has been a chronic issue.  He is not very active.  Constipation: He goes 2 times a week.  He does have to strain.  He reports hard bowel movements.  No blood in his stool.  Milk of Magnesia was not beneficial.  Social History   Tobacco Use  Smoking Status Never  Smokeless Tobacco Never    Current Outpatient Medications on File Prior to Visit  Medication Sig Dispense Refill   atorvastatin (LIPITOR) 80 MG tablet TAKE 1 TABLET BY MOUTH EVERY DAY 90 tablet 1   ferrous sulfate 325 (65 FE) MG tablet TAKE 1 TABLET BY MOUTH EVERY DAY 90 tablet 1   pantoprazole (PROTONIX) 40 MG tablet Take 1 tablet (40 mg total) by mouth daily. 90 tablet 2   sildenafil (REVATIO) 20 MG tablet Take 1 tablet (20 mg total) by mouth 3 (three) times daily. 90 tablet 1   torsemide (DEMADEX) 20 MG tablet Take 2 tablets (40 mg total) by mouth 2 (two) times daily. 360 tablet 0   No current facility-administered medications on file prior to visit.     ROS see history of present illness  Objective  Physical Exam Vitals:   06/04/23 1109 06/04/23 1110  BP: (!) 144/80 (!) 144/80  Pulse: 68   Temp: 97.8 F (36.6 C)   SpO2: 97%     BP Readings from Last 3 Encounters:  06/04/23 (!) 144/80  02/13/23 124/76  12/05/22 (!) 142/86   Wt Readings from Last 3 Encounters:  06/04/23 172 lb 12.8 oz (78.4 kg)  02/13/23 161 lb 6.4 oz (73.2 kg)  12/05/22 167 lb  12.8 oz (76.1 kg)    Physical Exam Constitutional:      General: He is not in acute distress.    Appearance: He is not diaphoretic.  Cardiovascular:     Rate and Rhythm: Normal rate and regular rhythm.     Heart sounds: Normal heart sounds.  Pulmonary:     Effort: Pulmonary effort is normal.     Breath sounds: Normal breath sounds.  Musculoskeletal:     Right lower leg: No edema.     Left lower leg: No edema.  Skin:    General: Skin is warm and dry.  Neurological:     Mental Status: He is alert.     Comments: 5/5 strength bilateral quads, hamstrings, plantarflexion, and dorsiflexion, sensation light touch intact bilateral lower extremities      Assessment/Plan: Please see individual problem list.  Physical deconditioning Assessment & Plan: Chronic issue.  Suspect related to inactivity.  We will order home health physical therapy for this.  Orders: -     Ambulatory referral to Home Health  Neuropathy Assessment & Plan: Chronic issue.  Continues to have some issues with this.  We will increase gabapentin to 200 mg twice daily.  He will monitor for drowsiness with this.  Orders: -     Gabapentin; Take 2 capsules (200 mg total) by  mouth 2 (two) times daily.  Dispense: 360 capsule; Refill: 1  Mixed hyperlipidemia Assessment & Plan: Chronic issue.  Check lipid panel.  Continue Lipitor 80 mg daily.  Orders: -     Comprehensive metabolic panel -     Lipid panel  Chronic diastolic congestive heart failure (HCC) Assessment & Plan: Chronic issue.  Appears euvolemic.  Patient will continue torsemide 40 mg twice daily.   Longstanding persistent atrial fibrillation (HCC) Assessment & Plan: Chronic issue.  Sinus rhythm.  He will continue Xarelto 15 mg daily as per 5 by cardiology previously.  Refill sent to pharmacy.  Orders: -     Rivaroxaban; TAKE 1 TABLET (15 MG TOTAL) BY MOUTH DAILY WITH SUPPER  Dispense: 90 tablet; Refill: 1  Constipation, unspecified constipation  type Assessment & Plan: Chronic issue.  Patient will start MiraLAX once daily as needed for constipation.  If not improving he will let us know.  Orders: -     Polyethylene Glycol 3350; Take 17 g by mouth daily as needed for mild constipation.  Dispense: 500 g; Refill: 0  Encounter for administration of vaccine -     FLU VACCINE MDCK QUAD W/Preservative -     Pneumococcal conjugate vaccine 20-valent     Health Maintenance: Patient given flu vaccine and Prevnar 20 today.  Return in about 3 months (around 09/03/2023).   Marikay Alar, MD [facility]

## 2023-06-04 NOTE — Assessment & Plan Note (Signed)
Chronic issue.  Appears euvolemic.  Patient will continue torsemide 40 mg twice daily.

## 2023-06-04 NOTE — Patient Instructions (Signed)
Home health should contact you to start physical therapy. Please start the MiraLAX daily as needed to help with bowel movements. We are increasing your gabapentin to 200 mg twice daily.  If this makes you drowsy during the day please let us know.

## 2023-06-04 NOTE — Assessment & Plan Note (Signed)
Chronic issue.  Sinus rhythm.  He will continue Xarelto 15 mg daily as per 5 by cardiology previously.  Refill sent to pharmacy.

## 2023-06-04 NOTE — Assessment & Plan Note (Signed)
Chronic issue.  Suspect related to inactivity.  We will order home health physical therapy for this.

## 2023-06-05 ENCOUNTER — Other Ambulatory Visit: Payer: Self-pay | Admitting: Family Medicine

## 2023-06-05 DIAGNOSIS — M7989 Other specified soft tissue disorders: Secondary | ICD-10-CM

## 2023-06-10 NOTE — Addendum Note (Signed)
Addended by: Warden Fillers on: 06/10/2023 09:13 AM   Modules accepted: Orders

## 2023-07-05 ENCOUNTER — Telehealth: Payer: Self-pay | Admitting: Family Medicine

## 2023-07-05 NOTE — Telephone Encounter (Signed)
[facility] called wanted to confirm that patient has peripheral neuropathy  Please call back [phone] (LIZ)

## 2023-07-11 ENCOUNTER — Telehealth: Payer: Self-pay

## 2023-07-11 ENCOUNTER — Telehealth: Payer: Self-pay | Admitting: Family Medicine

## 2023-07-11 NOTE — Telephone Encounter (Signed)
Yvette called office back, patient is fine. Patient is having issues with his phone.

## 2023-07-11 NOTE — Telephone Encounter (Signed)
Noreene Larsson called from [facility] to state patient tripped over a piece of wood on his front porch and fell backwards on 07/09/2023.  Noreene Larsson states patient has a bruise on his sacral area above bottom.  Noreene Larsson states patient has not been seen by anyone.  Noreene Larsson states patient states he did not hit his head.  Noreene Larsson states she would like for Dr. Marikay Alar to know.

## 2023-07-11 NOTE — Telephone Encounter (Signed)
Noted. If he is doing ok with no symptoms and did not hit his head then should be ok to not be evaluated, though if he is having pain or if he hit his head then he needs to be seen.

## 2023-07-11 NOTE — Telephone Encounter (Signed)
Noted  

## 2023-07-11 NOTE — Telephone Encounter (Signed)
Kathlene November from [facility] called stating pt missed a visit on 10/18

## 2023-07-11 NOTE — Telephone Encounter (Signed)
Called pts home number vm not set up unable to leave msg.  Called mobile number which is pt's niece Bronson Ing. Informed her that we received a call stating pt had a fall. Advise niece to have pt call or her call to let us know pt is okay and doesn't need further evaluation.

## 2023-07-12 DIAGNOSIS — E782 Mixed hyperlipidemia: Secondary | ICD-10-CM

## 2023-07-12 DIAGNOSIS — R531 Weakness: Secondary | ICD-10-CM

## 2023-07-12 DIAGNOSIS — I4819 Other persistent atrial fibrillation: Secondary | ICD-10-CM

## 2023-07-12 DIAGNOSIS — Z7901 Long term (current) use of anticoagulants: Secondary | ICD-10-CM

## 2023-07-12 DIAGNOSIS — I5032 Chronic diastolic (congestive) heart failure: Secondary | ICD-10-CM

## 2023-07-12 DIAGNOSIS — G6289 Other specified polyneuropathies: Secondary | ICD-10-CM

## 2023-07-12 DIAGNOSIS — R7303 Prediabetes: Secondary | ICD-10-CM

## 2023-07-12 NOTE — Telephone Encounter (Signed)
Returned Liz's call.

## 2023-07-12 NOTE — Telephone Encounter (Signed)
Noted  

## 2023-07-17 NOTE — Telephone Encounter (Signed)
FYI

## 2023-07-17 NOTE — Telephone Encounter (Signed)
Noreene Larsson from [facility] just called and said she just worked with the patient yesterday. And his resting heart rate was 54 beats per minute. She said he didn't have any symptoms. She just wants to let his provider know. The number she called from is [phone].

## 2023-08-06 ENCOUNTER — Telehealth: Payer: Self-pay | Admitting: Family Medicine

## 2023-08-06 NOTE — Telephone Encounter (Signed)
Left message to call the office back.

## 2023-08-06 NOTE — Telephone Encounter (Signed)
Fax received from home health indicating the patient's heart rate was 52.  Please contact the patient and see how he is doing.  Please offer him a visit to recheck his pulse.

## 2023-08-07 NOTE — Telephone Encounter (Signed)
Patient states he is fine except his legs bothering him. Patient did not wish to schedule an appointment at this time due to having to find a ride but stated he will call next week and schedule.

## 2023-08-12 NOTE — Telephone Encounter (Signed)
Noted. Will await his call back to schedule a visit.

## 2023-09-03 ENCOUNTER — Other Ambulatory Visit: Payer: Self-pay | Admitting: Family Medicine

## 2023-09-03 DIAGNOSIS — M7989 Other specified soft tissue disorders: Secondary | ICD-10-CM

## 2023-09-27 NOTE — Progress Notes (Deleted)
Date:  09/27/2023   ID:  Jordan Perkins, DOB April 26, 1941, MRN [id number]  Patient Location:  [address] [place] [address]   Provider location:   [facility], [place] office  PCP:  Jordan Luis, MD  Cardiologist:  Jordan Perkins  No chief complaint on file.   History of Present Illness:    Jordan Perkins is a 83 y.o. male  past medical history of CAD,  bypass surgery, 2005 at [facility] stents in 2005,  hypertension,  hyperlipidemia,  Chronic atrial fibrillation , on xarelto hospital with shortness of breath and chest pain, rapid atrial fibrillation.  Echocardiogram showed ejection fraction 60-65% Carotid 07/2016: 40 to 59% stenosis on left, <39% on the right Pulmonary HTN, right heart pressures estimated 100 on echocardiogram Chronic anemia Who presents for follow up of his CAD and atrial fibrillation, pulmonary HTN (estimated right heart pressures on echo of 100 mmHg on recent echo May 2022 and echo July 2021)  Last seen by myself in clinic October 2023   Seen in the emergency room June 04, 2022  Did not stay for complete evaluation At that time reported leg swelling, abdominal swelling Over the telephone was told to increase torsemide up to 40 twice daily  Presents today with his niece Still with significant leg edema though she reports abdomen less distended, lower extremity edema below the knees has improved but still significant Reports drinking several bottles of water a day Not very active at home secondary to leg swelling and immobility Today in a wheelchair  Does not eat out at restaurants very much Has been a nurse  EKG personally reviewed by myself on todays visit Atrial fibrillation ventricular rate 66 bpm  Prior procedure data reviewed RHC performed June 09, 2021 Severe pulmonary hypertension (mean PAP 63 mmHg, PVR 5.9 WU). Moderately elevated left and right heart filling pressures (RAP 15 mmHg,  PCWP 30 mmHg). Normal Fick cardiac output/index.  In follow-up today he is unsure if he is taking torsemide once a day or twice a day Denies significant leg swelling, no shortness of breath Thinks he is on sildenafil, he is unsure, possibly twice a day Previously declined referral to pulmonary hypertension clinic in [place], unable to drive Lives with his brother  Reports walking limited by arthritides  EKG personally reviewed by myself on todays visit Atrial fibrillation rate 70 bpm no significant ST-T wave changes  Other past medical history reviewed In the hospital 04/2021, weak, sodium low, 120 Given IVF, sodium improved by increasing his oral salt intake Was discharged back on his torsemide Now sodium 139  History of chronic anemia, Last hemoglobin 9.9 , chronic  Other lab work reviewed Creatinine 1.08, BUN 27  Echo 01/2021  Right heart pressures estimated 100 mmHg on echo  1. Left ventricular ejection fraction, by estimation, is 55 to 60%. The  left ventricle has normal function. The left ventricle has no regional  wall motion abnormalities. Left ventricular diastolic parameters are  consistent with Grade II diastolic  dysfunction (pseudonormalization).   2. Right ventricular systolic function is mildly reduced. The right  ventricular size is moderately enlarged.   3. Left atrial size was severely dilated.   4. Right atrial size was moderately dilated.   5. The mitral valve is degenerative. Mild mitral valve regurgitation.   6. The aortic valve is tricuspid. Aortic valve regurgitation is not  visualized. Mild aortic valve sclerosis is present, with no evidence of  aortic  valve stenosis.   7. The inferior vena cava is dilated in size with <50% respiratory  variability, suggesting right atrial pressure of 15 mmHg.   Echocardiogram July 2021 ejection fraction 60 to 65% Severely elevated right heart pressures  Zio: Atrial Fibrillation occurred continuously (100%  burden), ranging from 32-104 bpm (avg of 56 bpm). 2 Ventricular Tachycardia runs occurred, the run with the fastest interval lasting 7 beats with a max rate of 171 bpm (avg 117 bpm);  the run with the fastest interval was also the longest.  Isolated VEs were occasional (2.6%, 28107), VE Couplets were rare (<1.0%, 393), and VE Triplets were rare (<1.0%, 14). Ventricular Bigeminy and Trigeminy were present.  Hospital admission 10/2016 Hyponatremia, weakness, elevated T.Bili (gilberts)   hospital admission in 2014, noted to have gallstones.   negative Hida scan. Now s/p  Cholecystectomy 11/14   CT scan in the hospital of his abdomen and pelvis showed acute pancreatitis with increased fluid density in the peri-pancreatic fat, multiple gallstones, small hiatal hernia   Past Medical History:  Diagnosis Date   (HFpEF) heart failure with preserved ejection fraction (HCC)    CHF (congestive heart failure) (HCC)    Coronary artery disease    Hyperlipidemia    Hypertension    PAH (pulmonary artery hypertension) (HCC)    Permanent atrial fibrillation (HCC)    Pleural effusion    Past Surgical History:  Procedure Laterality Date   CARDIAC CATHETERIZATION     CHOLECYSTECTOMY     COLONOSCOPY WITH PROPOFOL N/A 08/23/2020   Procedure: COLONOSCOPY WITH PROPOFOL;  Surgeon: Jordan Reil, MD;  Location: [facility] ENDOSCOPY;  Service: Gastroenterology;  Laterality: N/A;   CORONARY ANGIOPLASTY  06/2004   s/p stent placement @ [facility]   CORONARY ARTERY BYPASS GRAFT  04-24-2004   CABG x 3 [facility]   ESOPHAGOGASTRODUODENOSCOPY (EGD) WITH PROPOFOL N/A 08/23/2020   Procedure: ESOPHAGOGASTRODUODENOSCOPY (EGD) WITH PROPOFOL;  Surgeon: Jordan Reil, MD;  Location: [facility] ENDOSCOPY;  Service: Gastroenterology;  Laterality: N/A;  Needs to be last case   RIGHT HEART CATH Right 06/09/2021   Procedure: RIGHT HEART CATH;  Surgeon: Jordan Kendall, MD;  Location: [facility] INVASIVE CV LAB;  Service: Cardiovascular;   Laterality: Right;     No outpatient medications have been marked as taking for the 09/30/23 encounter (Appointment) with Jordan Iba, MD.     Allergies:   Sulfa antibiotics   Social History   Tobacco Use   Smoking status: Never   Smokeless tobacco: Never  Vaping Use   Vaping status: Never Used  Substance Use Topics   Alcohol use: No   Drug use: No     Family Hx: The patient's family history includes Heart attack in his brother; Heart disease in his father and paternal uncle.  ROS:   Review of Systems  Constitutional: Negative.   HENT: Negative.    Respiratory: Negative.    Cardiovascular: Negative.   Gastrointestinal: Negative.   Musculoskeletal: Negative.   Neurological: Negative.   Psychiatric/Behavioral: Negative.    All other systems reviewed and are negative.    Labs/Other Tests and Data Reviewed:    Recent Labs: 02/13/2023: Hemoglobin 11.9; Platelets 126.0 06/04/2023: ALT 8; BUN 35; Creatinine, Ser 1.38; Potassium 4.1; Sodium 135   Recent Lipid Panel Lab Results  Component Value Date/Time   CHOL 135 06/04/2023 10:44 AM   CHOL 89 (L) 04/06/2015 04:42 PM   CHOL 150 06/06/2013 09:47 AM   TRIG 88.0 06/04/2023 10:44 AM   TRIG 85  06/06/2013 09:47 AM   HDL 36.70 (L) 06/04/2023 10:44 AM   HDL 40 04/06/2015 04:42 PM   HDL 43 06/06/2013 09:47 AM   CHOLHDL 4 06/04/2023 10:44 AM   LDLCALC 80 06/04/2023 10:44 AM   LDLCALC 36 04/06/2015 04:42 PM   LDLCALC 90 06/06/2013 09:47 AM    Wt Readings from Last 3 Encounters:  06/04/23 172 lb 12.8 oz (78.4 kg)  02/13/23 161 lb 6.4 oz (73.2 kg)  12/05/22 167 lb 12.8 oz (76.1 kg)     Exam:    There were no vitals taken for this visit. Constitutional:  oriented to person, place, and time. No distress.  HENT:  Head: Grossly normal Eyes:  no discharge. No scleral icterus.  Neck: No JVD, no carotid bruits  Cardiovascular: Irregularly irregular,  no murmurs appreciated Pulmonary/Chest: Clear to auscultation  bilaterally, no wheezes or rails Abdominal: Soft.  no distension.  no tenderness.  Musculoskeletal: Normal range of motion Neurological:  normal muscle tone. Coordination normal. No atrophy Skin: Skin warm and dry Psychiatric: normal affect, pleasant  ASSESSMENT & PLAN:    Chronic diastolic CHF/pulmonary hypertension Severely elevated right heart pressures documented on right heart catheterization and echocardiogram dating back several years -Does not want to go to [facility] to advanced heart failure clinic -Massive fluid overload with pitting edema up into his thighs, abdominal distention Family reports he is slowly improving on torsemide 40 twice daily Recommend we continue torsemide 40 twice daily for now with close follow-up We will arrange lab work in 1 week  Atrial fibrillation, unspecified type (HCC) Permanent atrial fibrillation On anticoagulation,  Rate controlled, not on beta-blocker No changes to his medications  Coronary artery disease of native artery of native heart with stable angina pectoris (HCC) Currently with no symptoms of angina. No further workup at this time. Continue current medication regimen.  PAD (peripheral artery disease) (HCC) Denies claudication symptoms Significant leg weakness, does not ambulate very far  Bilateral carotid artery disease, unspecified type (HCC) Cholesterol at goal Mild disease on left  S/P CABG (coronary artery bypass graft) Currently with no symptoms of angina.   Essential hypertension Blood pressure is well controlled on today's visit. No changes made to the medications.   Total encounter time more than 30 minutes  Greater than 50% was spent in counseling and coordination of care with the patient   Signed, Julien Nordmann, MD  09/27/2023 4:58 PM    [facility] [address], [place], [place] [address]

## 2023-09-30 ENCOUNTER — Ambulatory Visit: Payer: Medicare HMO | Admitting: Cardiovascular Disease

## 2023-09-30 DIAGNOSIS — I10 Essential (primary) hypertension: Secondary | ICD-10-CM

## 2023-09-30 DIAGNOSIS — I272 Pulmonary hypertension, unspecified: Secondary | ICD-10-CM

## 2023-09-30 DIAGNOSIS — I739 Peripheral vascular disease, unspecified: Secondary | ICD-10-CM

## 2023-09-30 DIAGNOSIS — I779 Disorder of arteries and arterioles, unspecified: Secondary | ICD-10-CM

## 2023-09-30 DIAGNOSIS — I25118 Atherosclerotic heart disease of native coronary artery with other forms of angina pectoris: Secondary | ICD-10-CM

## 2023-09-30 DIAGNOSIS — I4811 Longstanding persistent atrial fibrillation: Secondary | ICD-10-CM

## 2023-09-30 DIAGNOSIS — I5032 Chronic diastolic (congestive) heart failure: Secondary | ICD-10-CM

## 2023-09-30 DIAGNOSIS — E782 Mixed hyperlipidemia: Secondary | ICD-10-CM

## 2023-10-16 ENCOUNTER — Ambulatory Visit: Payer: Medicare HMO | Attending: Cardiology | Admitting: Cardiology

## 2023-10-16 ENCOUNTER — Encounter: Payer: Self-pay | Admitting: Cardiology

## 2023-10-16 VITALS — BP 142/78 | HR 57 | Ht 71.0 in | Wt 179.6 lb

## 2023-10-16 DIAGNOSIS — I25118 Atherosclerotic heart disease of native coronary artery with other forms of angina pectoris: Secondary | ICD-10-CM

## 2023-10-16 DIAGNOSIS — I10 Essential (primary) hypertension: Secondary | ICD-10-CM | POA: Diagnosis not present

## 2023-10-16 DIAGNOSIS — I4811 Longstanding persistent atrial fibrillation: Secondary | ICD-10-CM | POA: Diagnosis not present

## 2023-10-16 DIAGNOSIS — E782 Mixed hyperlipidemia: Secondary | ICD-10-CM

## 2023-10-16 DIAGNOSIS — I272 Pulmonary hypertension, unspecified: Secondary | ICD-10-CM

## 2023-10-16 DIAGNOSIS — I5032 Chronic diastolic (congestive) heart failure: Secondary | ICD-10-CM | POA: Diagnosis not present

## 2023-10-16 MED ORDER — RIVAROXABAN 15 MG PO TABS: ORAL_TABLET | ORAL | 3 refills | Status: DC

## 2023-10-16 MED ORDER — PANTOPRAZOLE SODIUM 40 MG PO TBEC: 40.0000 mg | DELAYED_RELEASE_TABLET | Freq: Every day | ORAL | 2 refills | Status: DC

## 2023-10-16 NOTE — Progress Notes (Signed)
 Cardiology Office Note:  .   Date:  10/16/2023  ID:  Jordan Perkins, DOB 01-17-1941, MRN [id number] PCP: Maribeth Camellia MATSU, MD  [facility] Providers Cardiologist:  Evalene Lunger, MD Cardiology APP:  Vannie Reche RAMAN, NP    History of Present Illness: .   Jordan Perkins is a 83 y.o. male with a past medical history of coronary artery disease status post bypass surgery (2005 [facility]), recurrent stents (2005), hypertension, hyperlipidemia, chronic atrial fibrillation on chronic Xarelto , chronic HFpEF, and CKD stage III, pulmonary hypertension, chronic anemia, who is here today for follow-up on his coronary artery disease.   Prior left heart catheterization 04/2024 [facility] treatment vessel CAD with the patient subsequently underwent three-vessel CABG with subsequent PCI in 06/2004 with details unclear.  Most recent ischemic evaluation noted to be in 2014 with no significant ischemia.  Echo from 8/207 showed hyperdynamic LV systolic function with an EF of 70 to 75%, trivial MR, mildly dilated left atrium.  Echocardiogram in 02/2019 showed hyperdynamic LV systolic function with EF greater than 65%, evidence of right ventricular volume and pressure overload with an estimated PASP 69.8 mmHg, moderate reduced LV systolic function with moderately enlarged RV cavity size and mildly increased RV wall thickness, degenerative mitral valve with moderate mitral annular calcification mild to moderate TR.  Echocardiogram 03/2020 showed EF 60 to 65%, mild biatrial enlargement, mild MR, trivial AI, mild aortic valve sclerosis without evidence of stenosis.  Outpatient cardiac monitoring 04/2020 showed 100% A-fib burden with NSVT with the longest and fastest interval lasting 7 beats.  He was admitted 7/14 through 04/06/2020 [facility] due to anasarca, acute on chronic diastolic heart failure, bradycardia.  His bradycardia was asymptomatic.  His metoprolol  was discontinued.  His hydrochlorothiazide  was discontinued.  His  Xarelto  was stopped due to FOBT and falls.  He had recurrent falls with generalized weakness.  He was discharged on Lasix  40 mg daily, lisinopril  40 mg daily, spironolactone  25 mg daily.  His weight on date of discharge was 75.8 kg.  Labs 04/04/2020 with hemoglobin 7.6, creatinine 1.06, GFR greater than 60.   He was seen in clinic 04/29/2020 with weight gain of 9 pounds over 2 weeks.  He was recommended to change his Lasix  dose however repeat blood work showed sodium of 119 he was recommended for evaluation in the ED.  Admitted 04/30/20 - 05/05/20 treated with 3% saline and then tolvaptan  x2.  Home health was recommended.  Palliative care continues to follow.  He was discharged on lisinopril  40 mg daily, Lasix  40 mg daily, spironolactone  25 mg daily.  Sodium on day of discharge 133.  Repeat echocardiogram done in May/2022 revealed LVEF 55 to 60%, no RWMA, G2 DD, mildly reduced RV systolic function moderately enlarged RV cavity size, severely dilated left atrium, mild MR, mild aortic valve sclerosis without evidence of stenosis.  He was then recommended for right heart catheterization and to follow-up with advanced heart failure clinic.   He was last seen in clinic 06/11/2022 by Dr. Gollan with complaints of shortness of breath and bilateral lower extremity edema.  He states that over the telephone he was told to increase his torsemide  up to 40 mg twice daily around the time that he started with leg swelling and abdominal swelling.  He was massive fluid overload with pitting edema up to his thighs, abdominal distention, family reported slightly improved on torsemide  40 mg twice daily would recommend continued torsemide  40 mg twice daily for now with close follow-up and  lab work within 1 week.  There were no further medication changes made or testing that was ordered at that time.  He returns to clinic today stating that overall he has been doing fairly well.  He denies any chest pain, shortness of breath, or  peripheral edema.  Continues to have complaints of neuropathy.  States that he has been out of his Xarelto  for approximately a week.  Denies any bleeding or noted blood in his urine or stool.  Also states that he has been out of his GERD medication.  Denies any hospitalizations or visits to the emergency department.  ROS: 10 point review of system has been reviewed and considered negative except what is been listed in HPI  Studies Reviewed: SABRA   EKG Interpretation Date/Time:  Wednesday October 16 2023 14:59:24 EST Ventricular Rate:  57 PR Interval:  244 QRS Duration:  92 QT Interval:  458 QTC Calculation: 445 R Axis:   125  Text Interpretation: Atrial fibrillation with prolonged AV conduction with premature ventricular or aberrantly conducted complexes Right axis deviation Pulmonary disease pattern ST & T wave abnormality, consider inferior ischemia When compared with ECG of 04-Jun-2022 11:59, Artifact Reconfirmed by Gerard Frederick ([id number]) on 10/16/2023 4:44:37 PM    RHC performed June 09, 2021 Severe pulmonary hypertension (mean PAP 63 mmHg, PVR 5.9 WU). Moderately elevated left and right heart filling pressures (RAP 15 mmHg, PCWP 30 mmHg). Normal Fick cardiac output/index.  Echo 01/2021  Right heart pressures estimated 100 mmHg on echo  1. Left ventricular ejection fraction, by estimation, is 55 to 60%. The  left ventricle has normal function. The left ventricle has no regional  wall motion abnormalities. Left ventricular diastolic parameters are  consistent with Grade II diastolic  dysfunction (pseudonormalization).   2. Right ventricular systolic function is mildly reduced. The right  ventricular size is moderately enlarged.   3. Left atrial size was severely dilated.   4. Right atrial size was moderately dilated.   5. The mitral valve is degenerative. Mild mitral valve regurgitation.   6. The aortic valve is tricuspid. Aortic valve regurgitation is not  visualized. Mild  aortic valve sclerosis is present, with no evidence of  aortic valve stenosis.   7. The inferior vena cava is dilated in size with <50% respiratory  variability, suggesting right atrial pressure of 15 mmHg.   Echocardiogram July 2021  ejection fraction 60 to 65% Severely elevated right heart pressures  Zio: Atrial Fibrillation occurred continuously (100% burden), ranging from 32-104 bpm (avg of 56 bpm). 2 Ventricular Tachycardia runs occurred, the run with the fastest interval lasting 7 beats with a max rate of 171 bpm (avg 117 bpm);  the run with the fastest interval was also the longest.  Isolated VEs were occasional (2.6%, 28107), VE Couplets were rare (<1.0%, 393), and VE Triplets were rare (<1.0%, 14). Ventricular Bigeminy and Trigeminy were present. Risk Assessment/Calculations:    CHA2DS2-VASc Score = 4   This indicates a 4.8% annual risk of stroke. The patient's score is based upon: CHF History: 0 HTN History: 1 Diabetes History: 0 Stroke History: 0 Vascular Disease History: 1 Age Score: 2 Gender Score: 0         Physical Exam:   VS:  BP (!) 142/78 (BP Location: Left Arm, Patient Position: Sitting, Cuff Size: Normal)   Pulse (!) 57   Ht 5' 11 (1.803 m)   Wt 179 lb 9.6 oz (81.5 kg)   SpO2 98%   BMI 25.05 kg/m  Wt Readings from Last 3 Encounters:  10/16/23 179 lb 9.6 oz (81.5 kg)  06/04/23 172 lb 12.8 oz (78.4 kg)  02/13/23 161 lb 6.4 oz (73.2 kg)    GEN: Well nourished, well developed in no acute distress NECK: No JVD; No carotid bruits CARDIAC: IR IR, no murmurs, rubs, gallops RESPIRATORY:  Clear to auscultation without rales, wheezing or rhonchi  ABDOMEN: Soft, non-tender, non-distended EXTREMITIES: Trace pretibial edema; No deformity   ASSESSMENT AND PLAN: .   Permanent atrial fibrillation patient with atrial fibrillation that is rate controlled with PVC versus aberrantly conducted beat  or right axis deviation noted on EKG today.  He is continued on  rivaroxaban  15 mg daily for CHA2DS2-VASc score of at least 4 for stroke prophylaxis.  He has rate control and does not require the use of beta-blocker therapy.  Chronic HFpEF/pulmonary hypertension with severely elevated right heart pressures documented on right heart catheterization echocardiogram dating back several years.  Declined advanced heart failure clinic in the past.  Swelling has improved to his bilateral lower extremities.  He is continued on torsemide  40 mg twice daily.  He appears to be euvolemic on exam today.  NYHA class I-II symptoms.  No medication changes were made today.  Encouraged to weigh daily and reduce salt in fluid intake.  Coronary artery disease status post CABG x 3 [facility].  He is continued on rivaroxaban  and lieu of aspirin  and Lipitor  80 mg daily.  No acute changes noted on EKG today.  Continues to deny angina or anginal equivalents.  No ischemic workup needed at this time.  Hypertension with blood pressure today 142/78.  He is continued on torsemide  40 mg twice daily.  Encouraged to continue to monitor pressures 1 to 2 hours postmedication administration at home as well.  Mixed hyperlipidemia with an LDL of 89/20/24.  Encouraged to continue his atorvastatin  80 mg daily as he is unsure if he is continued to take it every day.  This continues to be monitored by his PCP.       Dispo: Return to clinic to see MD/APP in 3 to 4 months or sooner if needed for reevaluation of symptoms.  Signed, Jaxon Mynhier, NP

## 2023-10-16 NOTE — Patient Instructions (Signed)
 Medication Instructions:  No changes *If you need a refill on your cardiac medications before your next appointment, please call your pharmacy*   Lab Work: Your provider would like for you to have the following labs today: CBC and BMET  If you have labs (blood work) drawn today and your tests are completely normal, you will receive your results only by: MyChart Message (if you have MyChart) OR A paper copy in the mail If you have any lab test that is abnormal or we need to change your treatment, we will call you to review the results.   Testing/Procedures: None ordered   Follow-Up: At [facility], you and your health needs are our priority.  As part of our continuing mission to provide you with exceptional heart care, we have created designated Provider Care Teams.  These Care Teams include your primary Cardiologist (physician) and Advanced Practice Providers (APPs -  Physician Assistants and Nurse Practitioners) who all work together to provide you with the care you need, when you need it.  We recommend signing up for the patient portal called MyChart.  Sign up information is provided on this After Visit Summary.  MyChart is used to connect with patients for Virtual Visits (Telemedicine).  Patients are able to view lab/test results, encounter notes, upcoming appointments, etc.  Non-urgent messages can be sent to your provider as well.   To learn more about what you can do with MyChart, go to [email or web address].    Your next appointment:   4 month(s)  Provider:   You may see Timothy Gollan, MD or one of the following Advanced Practice Providers on your designated Care Team:   Tylene Lunch, NP

## 2023-10-17 LAB — BASIC METABOLIC PANEL
BUN/Creatinine Ratio: 31 — ABNORMAL HIGH (ref 10–24)
BUN: 42 mg/dL — ABNORMAL HIGH (ref 8–27)
CO2: 23 mmol/L (ref 20–29)
Calcium: 9.6 mg/dL (ref 8.6–10.2)
Chloride: 98 mmol/L (ref 96–106)
Creatinine, Ser: 1.36 mg/dL — ABNORMAL HIGH (ref 0.76–1.27)
Glucose: 104 mg/dL — ABNORMAL HIGH (ref 70–99)
Potassium: 4.3 mmol/L (ref 3.5–5.2)
Sodium: 135 mmol/L (ref 134–144)
eGFR: 53 mL/min/{1.73_m2} — ABNORMAL LOW (ref >59)

## 2023-10-17 LAB — CBC
Hematocrit: 35.3 % — ABNORMAL LOW (ref 37.5–51.0)
Hemoglobin: 12.4 g/dL — ABNORMAL LOW (ref 13.0–17.7)
MCH: 34.4 pg — ABNORMAL HIGH (ref 26.6–33.0)
MCHC: 35.1 g/dL (ref 31.5–35.7)
MCV: 98 fL — ABNORMAL HIGH (ref 79–97)
Platelets: 116 10*3/uL — ABNORMAL LOW (ref 150–450)
RBC: 3.6 x10E6/uL — ABNORMAL LOW (ref 4.14–5.80)
RDW: 13.5 % (ref 11.6–15.4)
WBC: 5.3 10*3/uL (ref 3.4–10.8)

## 2023-10-17 NOTE — Progress Notes (Signed)
 Hemoglobin remained stable.  Kidney function is unchanged and potassium and sodium have remained stable.  Continued current medication regimen without changes at this time.

## 2023-10-18 ENCOUNTER — Other Ambulatory Visit: Payer: Self-pay | Admitting: Family Medicine

## 2023-10-18 DIAGNOSIS — K5900 Constipation, unspecified: Secondary | ICD-10-CM

## 2023-11-21 ENCOUNTER — Encounter: Payer: Self-pay | Admitting: Nurse Practitioner

## 2024-01-10 ENCOUNTER — Ambulatory Visit: Admitting: Cardiology

## 2024-02-13 ENCOUNTER — Ambulatory Visit: Payer: Medicare HMO | Admitting: Cardiology

## 2024-02-20 ENCOUNTER — Ambulatory Visit: Payer: Self-pay

## 2024-02-20 ENCOUNTER — Ambulatory Visit: Attending: Cardiology | Admitting: Cardiology

## 2024-02-20 ENCOUNTER — Encounter: Payer: Self-pay | Admitting: Cardiology

## 2024-02-20 VITALS — BP 150/82 | HR 60 | Ht 71.0 in | Wt 171.0 lb

## 2024-02-20 DIAGNOSIS — I272 Pulmonary hypertension, unspecified: Secondary | ICD-10-CM

## 2024-02-20 DIAGNOSIS — I5032 Chronic diastolic (congestive) heart failure: Secondary | ICD-10-CM

## 2024-02-20 DIAGNOSIS — E782 Mixed hyperlipidemia: Secondary | ICD-10-CM | POA: Diagnosis not present

## 2024-02-20 DIAGNOSIS — I25118 Atherosclerotic heart disease of native coronary artery with other forms of angina pectoris: Secondary | ICD-10-CM

## 2024-02-20 DIAGNOSIS — I4811 Longstanding persistent atrial fibrillation: Secondary | ICD-10-CM

## 2024-02-20 DIAGNOSIS — I10 Essential (primary) hypertension: Secondary | ICD-10-CM | POA: Diagnosis not present

## 2024-02-20 MED ORDER — PANTOPRAZOLE SODIUM 40 MG PO TBEC: 40.0000 mg | DELAYED_RELEASE_TABLET | Freq: Every day | ORAL | 2 refills | Status: AC

## 2024-02-20 MED ORDER — RIVAROXABAN 15 MG PO TABS: ORAL_TABLET | ORAL | 3 refills | Status: AC

## 2024-02-20 MED ORDER — ATORVASTATIN CALCIUM 80 MG PO TABS: 80.0000 mg | ORAL_TABLET | Freq: Every day | ORAL | 1 refills | Status: DC

## 2024-02-20 MED ORDER — AMLODIPINE BESYLATE 2.5 MG PO TABS: 2.5000 mg | ORAL_TABLET | Freq: Every day | ORAL | 3 refills | Status: AC

## 2024-02-20 MED ORDER — BLOOD PRESSURE CUFF MISC: 0 refills | Status: AC

## 2024-02-20 NOTE — Telephone Encounter (Signed)
   FYI Only or Action Required?: FYI only for provider  Patient was last seen in primary care on 06/04/2023 by Kent Pear, MD. Called Nurse Triage reporting Back Pain. Symptoms began today. Interventions attempted: Nothing. Symptoms are: stable.  Triage Disposition: See PCP When Office is Open (Within 3 Days) within 3-7 days  Patient/caregiver understands and will follow disposition?: Yes    Copied from CRM [id number]. Topic: Clinical - Red Word Triage >> Feb 20, 2024  1:18 PM [place] E wrote: [facility] that prompted transfer to Nurse Triage: Back pain. Patient experiencing severe back pain, could barely walk today. Reason for Disposition  [1] MODERATE back pain (e.g., interferes with normal activities) AND [2] present > 3 days  Answer Assessment - Initial Assessment Questions 1. ONSET: When did the pain begin?      This am 2. LOCATION: Where does it hurt? (upper, mid or lower back)     Lower back 3. SEVERITY: How bad is the pain?  (e.g., Scale 1-10; mild, moderate, or severe)   - MILD (1-3): Doesn't interfere with normal activities.    - MODERATE (4-7): Interferes with normal activities or awakens from sleep.    - SEVERE (8-10): Excruciating pain, unable to do any normal activities.      Hard to walk, moderate 4. PATTERN: Is the pain constant? (e.g., yes, no; constant, intermittent)      Not sure 5. RADIATION: Does the pain shoot into your legs or somewhere else?     legs 6. CAUSE:  What do you think is causing the back pain?      unknown 7. BACK OVERUSE:  Any recent lifting of heavy objects, strenuous work or exercise?     no 8. MEDICINES: What have you taken so far for the pain? (e.g., nothing, acetaminophen , NSAIDS)     Don't know 9. NEUROLOGIC SYMPTOMS: Do you have any weakness, numbness, or problems with bowel/bladder control?     no 10. OTHER SYMPTOMS: Do you have any other symptoms? (e.g., fever, abdomen pain, burning with urination, blood in  urine)       no 11. PREGNANCY: Is there any chance you are pregnant? When was your last menstrual period?       no  Protocols used: Back Pain-A-AH

## 2024-02-20 NOTE — Patient Instructions (Signed)
 Medication Instructions:  Your physician recommends that you continue on your current medications as directed. Please refer to the Current Medication list given to you today.   *If you need a refill on your cardiac medications before your next appointment, please call your pharmacy*  Lab Work: No labs ordered today  If you have labs (blood work) drawn today and your tests are completely normal, you will receive your results only by: MyChart Message (if you have MyChart) OR A paper copy in the mail If you have any lab test that is abnormal or we need to change your treatment, we will call you to review the results.  Testing/Procedures: No test ordered today   Follow-Up: At [facility], you and your health needs are our priority.  As part of our continuing mission to provide you with exceptional heart care, our providers are all part of one team.  This team includes your primary Cardiologist (physician) and Advanced Practice Providers or APPs (Physician Assistants and Nurse Practitioners) who all work together to provide you with the care you need, when you need it.  Your next appointment:   4 - 6 week(s)  Provider:   Belva Boyden, MD or Ronald Cockayne, NP

## 2024-02-20 NOTE — Progress Notes (Signed)
 Cardiology Office Note   Date:  02/20/2024  ID:  Edouard Gikas, DOB Sep 03, 1941, MRN [id number] PCP: Patient, No Pcp Per   [facility] Providers Cardiologist:  Belva Boyden, MD Cardiology APP:  Clearnce Curia, NP     History of Present Illness Jordan Perkins is a 83 y.o. male with past medical history of coronary artery disease status post bypass surgery (2005 [facility]), recurrent stents (2005), hypertension, hyperlipidemia, chronic atrial fibrillation on chronic rivaroxaban , chronic HFpEF, and CKD stage III, pulmonary hypertension, chronic anemia, who is here today to follow-up on his coronary artery disease.   Prior left heart catheterization 04/2024 [facility] treatment vessel CAD with the patient subsequently underwent three-vessel CABG with subsequent PCI in 06/2004 with details unclear.  Most recent ischemic evaluation noted to be in 2014 with no significant ischemia.  Echo from 8/207 showed hyperdynamic LV systolic function with an EF of 70 to 75%, trivial MR, mildly dilated left atrium.  Echocardiogram in 02/2019 showed hyperdynamic LV systolic function with EF greater than 65%, evidence of right ventricular volume and pressure overload with an estimated PASP 69.8 mmHg, moderate reduced LV systolic function with moderately enlarged RV cavity size and mildly increased RV wall thickness, degenerative mitral valve with moderate mitral annular calcification mild to moderate TR.  Echocardiogram 03/2020 showed EF 60 to 65%, mild biatrial enlargement, mild MR, trivial AI, mild aortic valve sclerosis without evidence of stenosis.  Outpatient cardiac monitoring 04/2020 showed 100% A-fib burden with NSVT with the longest and fastest interval lasting 7 beats.  He was admitted 7/14 through 04/06/2020 [facility] due to anasarca, acute on chronic diastolic heart failure, bradycardia.  His bradycardia was asymptomatic.  His metoprolol  was discontinued.  His hydrochlorothiazide  was discontinued.  His Xarelto   was stopped due to FOBT and falls.  He had recurrent falls with generalized weakness.  He was discharged on Lasix  40 mg daily, lisinopril  40 mg daily, spironolactone  25 mg daily.  His weight on date of discharge was 75.8 kg.  Labs 04/04/2020 with hemoglobin 7.6, creatinine 1.06, GFR greater than 60.   He was seen in clinic 04/29/2020 with weight gain of 9 pounds over 2 weeks.  He was recommended to change his Lasix  dose however repeat blood work showed sodium of 119 he was recommended for evaluation in the ED.  Admitted 04/30/20 - 05/05/20 treated with 3% saline and then tolvaptan  x2.  Home health was recommended.  Palliative care continues to follow.  He was discharged on lisinopril  40 mg daily, Lasix  40 mg daily, spironolactone  25 mg daily.  Sodium on day of discharge 133.  Repeat echocardiogram done in May/2022 revealed LVEF 55 to 60%, no RWMA, G2 DD, mildly reduced RV systolic function moderately enlarged RV cavity size, severely dilated left atrium, mild MR, mild aortic valve sclerosis without evidence of stenosis.  He was then recommended for right heart catheterization and to follow-up with advanced heart failure clinic.    He was last seen in clinic 10/16/2023 stating he been doing fairly well from a cardiac perspective.  Continues to have complaints of neuropathy.  States he been out of Xarelto  for approximately a week.  He was continued on rivaroxaban  with no other changes made to his regular medication regimen.  He was not scheduled for any other procedures at the time.  He returns to clinic today stating overall for the cardiac perspective he has been doing well.  He denies any chest pain, shortness of breath, dyspnea on exertion or peripheral edema.  States that he has been compliant with his current medication regimen without any undue side effects.  He is requesting refills today and also new blood pressure cuff as he is unable to monitor his pressures at home.  He denies any hospitalizations or  visits to the emergency department.  ROS: 10 point review of systems has been reviewed and considered negative except ones been listed in HPI  Studies Reviewed EKG Interpretation Date/Time:  Thursday February 20 2024 14:52:23 EDT Ventricular Rate:  60 PR Interval:    QRS Duration:  96 QT Interval:  452 QTC Calculation: 452 R Axis:   70  Text Interpretation: Atrial fibrillation T wave abnormality, consider inferior ischemia When compared with ECG of 16-Oct-2023 14:59, No significant change since last tracing Confirmed by Ronald Cockayne ([id number]) on 02/20/2024 3:16:01 PM    RHC performed June 09, 2021 Severe pulmonary hypertension (mean PAP 63 mmHg, PVR 5.9 WU). Moderately elevated left and right heart filling pressures (RAP 15 mmHg, PCWP 30 mmHg). Normal Fick cardiac output/index.   Echo 01/2021  Right heart pressures estimated 100 mmHg on echo  1. Left ventricular ejection fraction, by estimation, is 55 to 60%. The  left ventricle has normal function. The left ventricle has no regional  wall motion abnormalities. Left ventricular diastolic parameters are  consistent with Grade II diastolic  dysfunction (pseudonormalization).   2. Right ventricular systolic function is mildly reduced. The right  ventricular size is moderately enlarged.   3. Left atrial size was severely dilated.   4. Right atrial size was moderately dilated.   5. The mitral valve is degenerative. Mild mitral valve regurgitation.   6. The aortic valve is tricuspid. Aortic valve regurgitation is not  visualized. Mild aortic valve sclerosis is present, with no evidence of  aortic valve stenosis.   7. The inferior vena cava is dilated in size with <50% respiratory  variability, suggesting right atrial pressure of 15 mmHg.    Echocardiogram July 2021  ejection fraction 60 to 65% Severely elevated right heart pressures   Zio: Atrial Fibrillation occurred continuously (100% burden), ranging from 32-104 bpm (avg of 56  bpm). 2 Ventricular Tachycardia runs occurred, the run with the fastest interval lasting 7 beats with a max rate of 171 bpm (avg 117 bpm);  the run with the fastest interval was also the longest.  Isolated VEs were occasional (2.6%, 28107), VE Couplets were rare (<1.0%, 393), and VE Triplets were rare (<1.0%, 14). Ventricular Bigeminy and Trigeminy were present. Risk Assessment/Calculations  CHA2DS2-VASc Score = 4   This indicates a 4.8% annual risk of stroke. The patient's score is based upon: CHF History: 0 HTN History: 1 Diabetes History: 0 Stroke History: 0 Vascular Disease History: 1 Age Score: 2 Gender Score: 0    HYPERTENSION CONTROL Vitals:   02/20/24 1442 02/20/24 1518  BP: (!) 174/74 (!) 150/82    The patient's blood pressure is elevated above target today.  In order to address the patient's elevated BP: A new medication was prescribed today.          Physical Exam VS:  BP (!) 150/82 (BP Location: Left Arm, Patient Position: Sitting, Cuff Size: Normal)   Pulse 60   Ht 5' 11 (1.803 m)   Wt 171 lb (77.6 kg)   SpO2 96%   BMI 23.85 kg/m    Wt Readings from Last 3 Encounters:  02/20/24 171 lb (77.6 kg)  10/16/23 179 lb 9.6 oz (81.5 kg)  06/04/23 172 lb 12.8 oz (  78.4 kg)    GEN: Well nourished, well developed in no acute distress NECK: No JVD; No carotid bruits CARDIAC: IR IR, no murmurs, rubs, gallops RESPIRATORY:  Clear to auscultation without rales, wheezing or rhonchi  ABDOMEN: Soft, non-tender, non-distended EXTREMITIES:  No edema; No deformity   ASSESSMENT AND PLAN Permanent atrial fibrillation which is rate controlled atrial fibrillation with a rate of 60 on EKG today.  He is continued on rivaroxaban  50 mg daily for CHA2DS2-VASc of at least 4 for stroke prophylaxis.  He has not required the use of beta-blocker therapy due to baseline bradycardia.  Chronic HFpEF/pulmonary hypertension with severely elevated right heart pressures from prior right heart  catheterization and echocardiogram.  Declined advanced heart failure clinic in the past.  Swelling is improved his bilateral lower extremities he appears to be euvolemic today.  NYHA class I class II symptoms.  No changes made to his medications at this time.  He is continued on torsemide  40 mg twice daily.    Coronary disease status post CABG x 3 at [facility].  EKG today reveals rate controlled atrial fibrillation with no ischemic changes noted.  He is continue paroxetine and lieu of aspirin  and atorvastatin  80 mg daily.  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  No further ischemic workup at this time.  Primary hypertension with a blood pressure 174/74 with repeat 150/82.  Blood pressure continues to be elevated.  He is continued on torsemide  40 mg twice daily.  He has been started on amlodipine  2.5 mg daily for his blood pressure.  Unable to assess use of beta-blockers due to baseline of bradycardia and elevated serum creatinine prevents initiation of ACE or ARB.  Mixed hyperlipidemia with an LDL of 80.  He is continued on atorvastatin  80 mg daily.       Dispo: Patient return to clinic to see MD/APP in 4 to 6 weeks or sooner for reevaluation of blood pressure after changes in medication made today  Signed, Eldwin Volkov, NP

## 2024-02-26 ENCOUNTER — Encounter: Payer: Self-pay | Admitting: Internal Medicine

## 2024-02-26 ENCOUNTER — Ambulatory Visit (INDEPENDENT_AMBULATORY_CARE_PROVIDER_SITE_OTHER): Admitting: Internal Medicine

## 2024-02-26 VITALS — BP 138/82 | HR 71 | Temp 97.6°F | Resp 20 | Ht 71.0 in | Wt 170.5 lb

## 2024-02-26 DIAGNOSIS — M545 Low back pain, unspecified: Secondary | ICD-10-CM | POA: Diagnosis not present

## 2024-02-26 DIAGNOSIS — L853 Xerosis cutis: Secondary | ICD-10-CM | POA: Insufficient documentation

## 2024-02-26 DIAGNOSIS — L210 Seborrhea capitis: Secondary | ICD-10-CM | POA: Diagnosis not present

## 2024-02-26 DIAGNOSIS — G629 Polyneuropathy, unspecified: Secondary | ICD-10-CM | POA: Diagnosis not present

## 2024-02-26 DIAGNOSIS — M549 Dorsalgia, unspecified: Secondary | ICD-10-CM | POA: Insufficient documentation

## 2024-02-26 MED ORDER — HYDROCORTISONE 1 % EX OINT: 1.0000 | TOPICAL_OINTMENT | Freq: Two times a day (BID) | CUTANEOUS | 0 refills | Status: AC

## 2024-02-26 MED ORDER — KETOCONAZOLE 2 % EX SHAM: 1.0000 | MEDICATED_SHAMPOO | CUTANEOUS | 0 refills | Status: DC

## 2024-02-26 MED ORDER — METHOCARBAMOL 500 MG PO TABS: 500.0000 mg | ORAL_TABLET | Freq: Three times a day (TID) | ORAL | 0 refills | Status: AC | PRN

## 2024-02-26 MED ORDER — GABAPENTIN 100 MG PO CAPS: 200.0000 mg | ORAL_CAPSULE | Freq: Two times a day (BID) | ORAL | 1 refills | Status: AC

## 2024-02-26 NOTE — Assessment & Plan Note (Signed)
-   Patient complaining of acute onset of lower back pain over the last week -Pain is bilateral, paraspinal and nonradiating  -No red flag symptoms noted - On exam patient was noted to have mild tenderness to palpation in his lower back in the right and left paraspinal areas - I suspect he likely has a muscle strain - Will prescribe Robaxin as needed and also advised over-the-counter Bengay or Biofreeze - No further workup at this time.  If pain persists despite conservative measures would consider x-rays as well as possible PT - Return precautions given to the patient

## 2024-02-26 NOTE — Assessment & Plan Note (Signed)
-   Patient noted to have dry flaky skin over bilateral lower extremities with underlying chronic venous congestive changes -Patient with complaints of itching over his lower extremities -Will prescribe hydrocortisone ointment to help with this -No further workup at this time

## 2024-02-26 NOTE — Assessment & Plan Note (Signed)
-   Patient with areas of erythema over his scalp and complaints of itching concerning for possible seborrheic dermatitis -I have prescribed ketoconazole shampoo for him to be applied 2 times a week -No further workup at this time

## 2024-02-26 NOTE — Patient Instructions (Signed)
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  I will prescribe ketoconazole shampoo for you -I will prescribe a hydrocortisone cream for your legs to see if this will help with the scaling and itchiness that you have -Please contact us  with any questions or concerns or if your back pain worsens or you develop weakness in your legs or if you develop tingling and numbness or any bowel or bladder dysfunction

## 2024-02-26 NOTE — Progress Notes (Signed)
 Acute Office Visit  Subjective:     Patient ID: Jordan Perkins, male    DOB: 03-13-1941, 83 y.o.   MRN: [id number]  Chief Complaint  Patient presents with   Back Pain    Mid lower back X 1    Leg Pain    Both legs X a while     Back Pain Associated symptoms include leg pain.  Leg Pain    Patient is in today for lower back pain x 1 week.  Patient states that approximately 1 week ago he developed acute onset lower back pain.  Pain is paraspinal and describes more as constant ache.  No radiation of pain.  Patient states that the pain is both on the right and left side.  No focal weakness or tingling or numbness or bowel bladder dysfunction.  Patient also complains of itching over his scalp and eyebrows and occasional flaking  Also complains of dry legs with flaking of skin and redness  Review of Systems  Constitutional: Negative.   HENT: Negative.    Respiratory: Negative.    Cardiovascular: Negative.   Gastrointestinal: Negative.   Genitourinary: Negative.   Musculoskeletal:  Positive for back pain.  Neurological: Negative.   Psychiatric/Behavioral: Negative.          Objective:    BP 138/82   Pulse 71   Temp 97.6 F (36.4 C)   Resp 20   Ht 5' 11 (1.803 m)   Wt 170 lb 8 oz (77.3 kg)   SpO2 98%   BMI 23.78 kg/m    Physical Exam Constitutional:      Appearance: Normal appearance.  HENT:     Head: Normocephalic and atraumatic.   Cardiovascular:     Rate and Rhythm: Normal rate and regular rhythm.  Pulmonary:     Effort: No respiratory distress.   Musculoskeletal:     Comments: Mild tenderness noted in his lower back and right and left paraspinal regions to palpation.  No rash noted   Skin:    Comments: Patient with areas of redness over his scalp as well as dry flaky skin over both lower extremities with signs of venous congestive changes   Neurological:     Mental Status: He is alert and oriented to person, place, and time.     Cranial  Nerves: Cranial nerve deficit present.   Psychiatric:        Mood and Affect: Mood normal.        Behavior: Behavior normal.     No results found for any visits on 02/26/24.      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   - Patient with areas of erythema over his scalp and complaints of itching concerning for possible seborrheic dermatitis -I have prescribed ketoconazole shampoo for him to be applied 2 times a week -No further workup at this time      Relevant Medications   ketoconazole (NIZORAL) 2 % shampoo (Start on 02/27/2024)   Xerosis of skin   - Patient noted to  have dry flaky skin over bilateral lower extremities with underlying chronic venous congestive changes -Patient with complaints of itching over his lower extremities -Will prescribe hydrocortisone ointment to help with this -No further workup at this time      Relevant Medications   hydrocortisone 1 % ointment     Other   Back pain   - Patient complaining of acute onset of lower back pain over the last week -Pain is bilateral, paraspinal and nonradiating  -No red flag symptoms noted - On exam patient was noted to have mild tenderness to palpation in his lower back in the right and left paraspinal areas - I suspect he likely has a muscle strain - Will prescribe Robaxin as needed and also advised over-the-counter Bengay or Biofreeze - No further workup at this time.  If pain persists despite conservative measures would consider x-rays as well as possible PT - Return precautions given to the patient       Relevant  Medications   methocarbamol (ROBAXIN) 500 MG tablet    Meds ordered this encounter  Medications   gabapentin  (NEURONTIN ) 100 MG capsule    Sig: Take 2 capsules (200 mg total) by mouth 2 (two) times daily.    Dispense:  360 capsule    Refill:  1   ketoconazole (NIZORAL) 2 % shampoo    Sig: Apply 1 Application topically 2 (two) times a week.    Dispense:  120 mL    Refill:  0   hydrocortisone 1 % ointment    Sig: Apply 1 Application topically 2 (two) times daily.    Dispense:  30 g    Refill:  0   methocarbamol (ROBAXIN) 500 MG tablet    Sig: Take 1 tablet (500 mg total) by mouth every 8 (eight) hours as needed for muscle spasms.    Dispense:  15 tablet    Refill:  0    No follow-ups on file.  Aubry Rankin, MD

## 2024-02-26 NOTE — Assessment & Plan Note (Signed)
-   This problem is chronic and stable -Patient is on gabapentin  for this (200 mg twice daily) -Will refill this medication for him today -No further workup at this time

## 2024-03-20 ENCOUNTER — Ambulatory Visit: Attending: Cardiology | Admitting: Cardiology

## 2024-03-20 NOTE — Progress Notes (Deleted)
 Cardiology Office Note   Date:  03/20/2024  ID:  Jordan Perkins, DOB 1941/04/01, MRN [id number] PCP: Patient, No Pcp Per  [facility] Providers Cardiologist:  Evalene Lunger, MD Cardiology APP:  Vannie Reche RAMAN, NP { Click to update primary MD,subspecialty MD or APP then REFRESH:1}    History of Present Illness Jordan Perkins is a 83 y.o. male with a past medical history of coronary artery disease status post bypass surgery (2005 [facility]), recurrent stents (2005), hypertension, hyperlipidemia, chronic atrial fibrillation on chronic rivaroxaban , chronic HFpEF, and CKD stage III, pulmonary hypertension, chronic anemia, who is here today to follow-up on his coronary artery disease.   Prior left heart catheterization 04/2024 [facility] treatment vessel CAD with the patient subsequently underwent three-vessel CABG with subsequent PCI in 06/2004 with details unclear.  Most recent ischemic evaluation noted to be in 2014 with no significant ischemia.  Echo from 8/207 showed hyperdynamic LV systolic function with an EF of 70 to 75%, trivial MR, mildly dilated left atrium.  Echocardiogram in 02/2019 showed hyperdynamic LV systolic function with EF greater than 65%, evidence of right ventricular volume and pressure overload with an estimated PASP 69.8 mmHg, moderate reduced LV systolic function with moderately enlarged RV cavity size and mildly increased RV wall thickness, degenerative mitral valve with moderate mitral annular calcification mild to moderate TR.  Echocardiogram 03/2020 showed EF 60 to 65%, mild biatrial enlargement, mild MR, trivial AI, mild aortic valve sclerosis without evidence of stenosis.  Outpatient cardiac monitoring 04/2020 showed 100% A-fib burden with NSVT with the longest and fastest interval lasting 7 beats.  He was admitted 7/14 through 04/06/2020 [facility] due to anasarca, acute on chronic diastolic heart failure, bradycardia.  His bradycardia was asymptomatic.  His metoprolol  was  discontinued.  His hydrochlorothiazide  was discontinued.  His Xarelto  was stopped due to FOBT and falls.  He had recurrent falls with generalized weakness.  He was discharged on Lasix  40 mg daily, lisinopril  40 mg daily, spironolactone  25 mg daily.  His weight on date of discharge was 75.8 kg.  Labs 04/04/2020 with hemoglobin 7.6, creatinine 1.06, GFR greater than 60.   He was seen in clinic 04/29/2020 with weight gain of 9 pounds over 2 weeks.  He was recommended to change his Lasix  dose however repeat blood work showed sodium of 119 he was recommended for evaluation in the ED.  Admitted 04/30/20 - 05/05/20 treated with 3% saline and then tolvaptan  x2.  Home health was recommended.  Palliative care continues to follow.  He was discharged on lisinopril  40 mg daily, Lasix  40 mg daily, spironolactone  25 mg daily.  Sodium on day of discharge 133.  Repeat echocardiogram done in May/2022 revealed LVEF 55 to 60%, no RWMA, G2 DD, mildly reduced RV systolic function moderately enlarged RV cavity size, severely dilated left atrium, mild MR, mild aortic valve sclerosis without evidence of stenosis.  He was then recommended for right heart catheterization and to follow-up with advanced heart failure clinic.   He was seen in clinic 10/11/2020 standing up and on fairly well from a cardiac perspective.  He continued to have complaints of neuropathy.  Unfortunately had been out of his blood thinners for approximately a week at that time.  There were no other medication changes were made or other testing that was scheduled at this time.   He was last seen in clinic 02/20/2024 at that time he was doing well from a cardiac perspective.  States he been compliant with his current  medication regimen without any undue side effects.  Was requesting refills on his medication as well as requesting a prescription for new blood pressure cuff so he can monitor his blood pressure at home.  Blood pressure was elevated in clinic and he was  started on amlodipine  2.5 mg daily.  He returns to clinic today for  ROS: 10 point review of system has been reviewed and considered negative with exception ones are listed in the HPI  Studies Reviewed      RHC performed June 09, 2021 Severe pulmonary hypertension (mean PAP 63 mmHg, PVR 5.9 WU). Moderately elevated left and right heart filling pressures (RAP 15 mmHg, PCWP 30 mmHg). Normal Fick cardiac output/index.   Echo 01/2021  Right heart pressures estimated 100 mmHg on echo  1. Left ventricular ejection fraction, by estimation, is 55 to 60%. The  left ventricle has normal function. The left ventricle has no regional  wall motion abnormalities. Left ventricular diastolic parameters are  consistent with Grade II diastolic  dysfunction (pseudonormalization).   2. Right ventricular systolic function is mildly reduced. The right  ventricular size is moderately enlarged.   3. Left atrial size was severely dilated.   4. Right atrial size was moderately dilated.   5. The mitral valve is degenerative. Mild mitral valve regurgitation.   6. The aortic valve is tricuspid. Aortic valve regurgitation is not  visualized. Mild aortic valve sclerosis is present, with no evidence of  aortic valve stenosis.   7. The inferior vena cava is dilated in size with <50% respiratory  variability, suggesting right atrial pressure of 15 mmHg.    Echocardiogram July 2021  ejection fraction 60 to 65% Severely elevated right heart pressures   Zio: Atrial Fibrillation occurred continuously (100% burden), ranging from 32-104 bpm (avg of 56 bpm). 2 Ventricular Tachycardia runs occurred, the run with the fastest interval lasting 7 beats with a max rate of 171 bpm (avg 117 bpm);  the run with the fastest interval was also the longest.  Isolated VEs were occasional (2.6%, 28107), VE Couplets were rare (<1.0%, 393), and VE Triplets were rare (<1.0%, 14). Ventricular Bigeminy and Trigeminy were  present. Risk Assessment/Calculations  CHA2DS2-VASc Score = 4  {Confirm score is correct.  If not, click here to update score.  REFRESH note.  :1} This indicates a 4.8% annual risk of stroke. The patient's score is based upon: CHF History: 0 HTN History: 1 Diabetes History: 0 Stroke History: 0 Vascular Disease History: 1 Age Score: 2 Gender Score: 0   {This patient has a significant risk of stroke if diagnosed with atrial fibrillation.  Please consider VKA or DOAC agent for anticoagulation if the bleeding risk is acceptable.   You can also use the SmartPhrase .[email or web address] for documentation.   :[id number]} No BP recorded.  {Refresh Note OR Click here to enter BP  :1}***       Physical Exam VS:  There were no vitals taken for this visit.       Wt Readings from Last 3 Encounters:  02/26/24 170 lb 8 oz (77.3 kg)  02/20/24 171 lb (77.6 kg)  10/16/23 179 lb 9.6 oz (81.5 kg)    GEN: Well nourished, well developed in no acute distress NECK: No JVD; No carotid bruits CARDIAC: ***RRR, no murmurs, rubs, gallops RESPIRATORY:  Clear to auscultation without rales, wheezing or rhonchi  ABDOMEN: Soft, non-tender, non-distended EXTREMITIES:  No edema; No deformity   ASSESSMENT AND PLAN Permanent atrial fibrillation Chronic HFpEF/pulmonary hypertension with  severely elevated right heart pressures Coronary disease status post CABG x 3 at [facility] Primary hypertension Mixed hyperlipidemia    {Are you ordering a CV Procedure (e.g. stress test, cath, DCCV, TEE, etc)?   Press F2        :[id number]}  Dispo: ***  Signed, Cerrone Debold, NP

## 2024-03-28 ENCOUNTER — Other Ambulatory Visit: Payer: Self-pay | Admitting: Internal Medicine

## 2024-03-28 DIAGNOSIS — L210 Seborrhea capitis: Secondary | ICD-10-CM

## 2024-03-31 NOTE — Telephone Encounter (Signed)
 Attempted to call the Patient to confirm that the shampoo is helping his symptoms before Dr. Onesimo sends it in. No answer or voicemail box to leave a message.

## 2024-04-01 NOTE — Telephone Encounter (Signed)
 Spoke to Patient he states this shampoo does help him so I sent in a refill per Dr. Narendra.

## 2024-04-01 NOTE — Telephone Encounter (Signed)
 Attempted to call Patient phone went straight to voicemail is full .

## 2024-05-19 DIAGNOSIS — R8290 Unspecified abnormal findings in urine: Secondary | ICD-10-CM | POA: Diagnosis not present

## 2024-05-19 DIAGNOSIS — D508 Other iron deficiency anemias: Secondary | ICD-10-CM | POA: Diagnosis not present

## 2024-05-19 DIAGNOSIS — I5032 Chronic diastolic (congestive) heart failure: Secondary | ICD-10-CM | POA: Diagnosis not present

## 2024-05-19 DIAGNOSIS — I482 Chronic atrial fibrillation, unspecified: Secondary | ICD-10-CM | POA: Diagnosis not present

## 2024-05-19 DIAGNOSIS — E782 Mixed hyperlipidemia: Secondary | ICD-10-CM | POA: Diagnosis not present

## 2024-05-19 DIAGNOSIS — Z1331 Encounter for screening for depression: Secondary | ICD-10-CM | POA: Diagnosis not present

## 2024-05-19 DIAGNOSIS — Z125 Encounter for screening for malignant neoplasm of prostate: Secondary | ICD-10-CM | POA: Diagnosis not present

## 2024-05-19 DIAGNOSIS — Z0000 Encounter for general adult medical examination without abnormal findings: Secondary | ICD-10-CM | POA: Diagnosis not present

## 2024-05-19 DIAGNOSIS — G629 Polyneuropathy, unspecified: Secondary | ICD-10-CM | POA: Diagnosis not present

## 2024-05-19 DIAGNOSIS — Z79899 Other long term (current) drug therapy: Secondary | ICD-10-CM | POA: Diagnosis not present

## 2024-05-19 DIAGNOSIS — Z951 Presence of aortocoronary bypass graft: Secondary | ICD-10-CM | POA: Diagnosis not present

## 2024-05-19 DIAGNOSIS — M51362 Other intervertebral disc degeneration, lumbar region with discogenic back pain and lower extremity pain: Secondary | ICD-10-CM | POA: Diagnosis not present

## 2024-05-20 ENCOUNTER — Other Ambulatory Visit: Payer: Self-pay

## 2024-05-20 DIAGNOSIS — M7989 Other specified soft tissue disorders: Secondary | ICD-10-CM

## 2024-05-20 DIAGNOSIS — R8290 Unspecified abnormal findings in urine: Secondary | ICD-10-CM | POA: Diagnosis not present

## 2024-05-22 MED ORDER — TORSEMIDE 20 MG PO TABS
40.0000 mg | ORAL_TABLET | Freq: Two times a day (BID) | ORAL | 0 refills | Status: AC
Start: 2024-05-22 — End: ?

## 2024-05-26 ENCOUNTER — Ambulatory Visit: Admitting: Cardiology

## 2024-05-28 ENCOUNTER — Ambulatory Visit: Admitting: Cardiology

## 2024-06-01 ENCOUNTER — Encounter: Payer: Self-pay | Admitting: Cardiovascular Disease

## 2024-06-01 NOTE — Telephone Encounter (Signed)
 RX pended - last Rx'd in 2022 - please advise on filling

## 2024-06-02 ENCOUNTER — Ambulatory Visit: Admitting: Nurse Practitioner

## 2024-06-02 NOTE — Telephone Encounter (Signed)
 Medication was on his list at his last appointment.  Sildenafil  3 times daily is prescribed for pulmonary hypertension.  And it is reasonable to refill the medication as previously prescribed.

## 2024-06-03 DIAGNOSIS — N3000 Acute cystitis without hematuria: Secondary | ICD-10-CM | POA: Diagnosis not present

## 2024-06-03 DIAGNOSIS — N183 Chronic kidney disease, stage 3 unspecified: Secondary | ICD-10-CM | POA: Diagnosis not present

## 2024-06-03 DIAGNOSIS — I2510 Atherosclerotic heart disease of native coronary artery without angina pectoris: Secondary | ICD-10-CM | POA: Diagnosis not present

## 2024-06-03 DIAGNOSIS — R609 Edema, unspecified: Secondary | ICD-10-CM | POA: Diagnosis not present

## 2024-06-03 DIAGNOSIS — R29898 Other symptoms and signs involving the musculoskeletal system: Secondary | ICD-10-CM | POA: Diagnosis not present

## 2024-06-03 DIAGNOSIS — M1611 Unilateral primary osteoarthritis, right hip: Secondary | ICD-10-CM | POA: Diagnosis not present

## 2024-06-03 DIAGNOSIS — R911 Solitary pulmonary nodule: Secondary | ICD-10-CM | POA: Diagnosis not present

## 2024-06-03 DIAGNOSIS — E785 Hyperlipidemia, unspecified: Secondary | ICD-10-CM | POA: Diagnosis not present

## 2024-06-03 DIAGNOSIS — M1711 Unilateral primary osteoarthritis, right knee: Secondary | ICD-10-CM | POA: Diagnosis not present

## 2024-06-03 DIAGNOSIS — M2669 Other specified disorders of temporomandibular joint: Secondary | ICD-10-CM | POA: Diagnosis not present

## 2024-06-03 DIAGNOSIS — Z79899 Other long term (current) drug therapy: Secondary | ICD-10-CM | POA: Diagnosis not present

## 2024-06-03 DIAGNOSIS — H6123 Impacted cerumen, bilateral: Secondary | ICD-10-CM | POA: Diagnosis not present

## 2024-06-03 DIAGNOSIS — Z882 Allergy status to sulfonamides status: Secondary | ICD-10-CM | POA: Diagnosis not present

## 2024-06-03 DIAGNOSIS — I130 Hypertensive heart and chronic kidney disease with heart failure and stage 1 through stage 4 chronic kidney disease, or unspecified chronic kidney disease: Secondary | ICD-10-CM | POA: Diagnosis not present

## 2024-06-03 DIAGNOSIS — J984 Other disorders of lung: Secondary | ICD-10-CM | POA: Diagnosis not present

## 2024-06-03 DIAGNOSIS — W1830XA Fall on same level, unspecified, initial encounter: Secondary | ICD-10-CM | POA: Diagnosis not present

## 2024-06-03 DIAGNOSIS — I4891 Unspecified atrial fibrillation: Secondary | ICD-10-CM | POA: Diagnosis not present

## 2024-06-03 DIAGNOSIS — I6523 Occlusion and stenosis of bilateral carotid arteries: Secondary | ICD-10-CM | POA: Diagnosis not present

## 2024-06-03 DIAGNOSIS — Z7901 Long term (current) use of anticoagulants: Secondary | ICD-10-CM | POA: Diagnosis not present

## 2024-06-03 DIAGNOSIS — S8011XA Contusion of right lower leg, initial encounter: Secondary | ICD-10-CM | POA: Diagnosis not present

## 2024-06-03 DIAGNOSIS — R531 Weakness: Secondary | ICD-10-CM | POA: Diagnosis not present

## 2024-06-03 DIAGNOSIS — I5030 Unspecified diastolic (congestive) heart failure: Secondary | ICD-10-CM | POA: Diagnosis not present

## 2024-06-03 DIAGNOSIS — R296 Repeated falls: Secondary | ICD-10-CM | POA: Diagnosis not present

## 2024-06-03 DIAGNOSIS — E041 Nontoxic single thyroid nodule: Secondary | ICD-10-CM | POA: Diagnosis not present

## 2024-06-03 DIAGNOSIS — S7001XA Contusion of right hip, initial encounter: Secondary | ICD-10-CM | POA: Diagnosis not present

## 2024-06-03 DIAGNOSIS — M799 Soft tissue disorder, unspecified: Secondary | ICD-10-CM | POA: Diagnosis not present

## 2024-06-03 DIAGNOSIS — Z951 Presence of aortocoronary bypass graft: Secondary | ICD-10-CM | POA: Diagnosis not present

## 2024-06-03 DIAGNOSIS — S32039A Unspecified fracture of third lumbar vertebra, initial encounter for closed fracture: Secondary | ICD-10-CM | POA: Diagnosis not present

## 2024-06-03 DIAGNOSIS — J321 Chronic frontal sinusitis: Secondary | ICD-10-CM | POA: Diagnosis not present

## 2024-06-03 MED ORDER — SILDENAFIL CITRATE 20 MG PO TABS: 20.0000 mg | ORAL_TABLET | Freq: Three times a day (TID) | ORAL | 1 refills | Status: DC

## 2024-06-04 ENCOUNTER — Telehealth: Payer: Self-pay | Admitting: Pharmacy Technician

## 2024-06-04 ENCOUNTER — Other Ambulatory Visit (HOSPITAL_COMMUNITY): Payer: Self-pay

## 2024-06-04 DIAGNOSIS — R41841 Cognitive communication deficit: Secondary | ICD-10-CM | POA: Diagnosis not present

## 2024-06-04 DIAGNOSIS — I4891 Unspecified atrial fibrillation: Secondary | ICD-10-CM | POA: Diagnosis not present

## 2024-06-04 DIAGNOSIS — M1380 Other specified arthritis, unspecified site: Secondary | ICD-10-CM | POA: Diagnosis not present

## 2024-06-04 DIAGNOSIS — M6259 Muscle wasting and atrophy, not elsewhere classified, multiple sites: Secondary | ICD-10-CM | POA: Diagnosis not present

## 2024-06-04 DIAGNOSIS — N390 Urinary tract infection, site not specified: Secondary | ICD-10-CM | POA: Diagnosis not present

## 2024-06-04 DIAGNOSIS — E785 Hyperlipidemia, unspecified: Secondary | ICD-10-CM | POA: Diagnosis not present

## 2024-06-04 DIAGNOSIS — S8011XA Contusion of right lower leg, initial encounter: Secondary | ICD-10-CM | POA: Diagnosis not present

## 2024-06-04 DIAGNOSIS — R29898 Other symptoms and signs involving the musculoskeletal system: Secondary | ICD-10-CM | POA: Diagnosis not present

## 2024-06-04 DIAGNOSIS — R2681 Unsteadiness on feet: Secondary | ICD-10-CM | POA: Diagnosis not present

## 2024-06-04 DIAGNOSIS — N183 Chronic kidney disease, stage 3 unspecified: Secondary | ICD-10-CM | POA: Diagnosis not present

## 2024-06-04 DIAGNOSIS — R279 Unspecified lack of coordination: Secondary | ICD-10-CM | POA: Diagnosis not present

## 2024-06-04 DIAGNOSIS — Z882 Allergy status to sulfonamides status: Secondary | ICD-10-CM | POA: Diagnosis not present

## 2024-06-04 DIAGNOSIS — I5030 Unspecified diastolic (congestive) heart failure: Secondary | ICD-10-CM | POA: Diagnosis not present

## 2024-06-04 DIAGNOSIS — I10 Essential (primary) hypertension: Secondary | ICD-10-CM | POA: Diagnosis not present

## 2024-06-04 DIAGNOSIS — Z951 Presence of aortocoronary bypass graft: Secondary | ICD-10-CM | POA: Diagnosis not present

## 2024-06-04 DIAGNOSIS — R5381 Other malaise: Secondary | ICD-10-CM | POA: Diagnosis not present

## 2024-06-04 DIAGNOSIS — M6281 Muscle weakness (generalized): Secondary | ICD-10-CM | POA: Diagnosis not present

## 2024-06-04 DIAGNOSIS — I5023 Acute on chronic systolic (congestive) heart failure: Secondary | ICD-10-CM | POA: Diagnosis not present

## 2024-06-04 DIAGNOSIS — K5900 Constipation, unspecified: Secondary | ICD-10-CM | POA: Diagnosis not present

## 2024-06-04 DIAGNOSIS — I482 Chronic atrial fibrillation, unspecified: Secondary | ICD-10-CM | POA: Diagnosis not present

## 2024-06-04 DIAGNOSIS — R296 Repeated falls: Secondary | ICD-10-CM | POA: Diagnosis not present

## 2024-06-04 DIAGNOSIS — E7849 Other hyperlipidemia: Secondary | ICD-10-CM | POA: Diagnosis not present

## 2024-06-04 DIAGNOSIS — W1830XA Fall on same level, unspecified, initial encounter: Secondary | ICD-10-CM | POA: Diagnosis not present

## 2024-06-04 DIAGNOSIS — Z79899 Other long term (current) drug therapy: Secondary | ICD-10-CM | POA: Diagnosis not present

## 2024-06-04 DIAGNOSIS — Z7901 Long term (current) use of anticoagulants: Secondary | ICD-10-CM | POA: Diagnosis not present

## 2024-06-04 DIAGNOSIS — I2510 Atherosclerotic heart disease of native coronary artery without angina pectoris: Secondary | ICD-10-CM | POA: Diagnosis not present

## 2024-06-04 DIAGNOSIS — I5020 Unspecified systolic (congestive) heart failure: Secondary | ICD-10-CM | POA: Diagnosis not present

## 2024-06-04 DIAGNOSIS — R001 Bradycardia, unspecified: Secondary | ICD-10-CM | POA: Diagnosis not present

## 2024-06-04 DIAGNOSIS — I130 Hypertensive heart and chronic kidney disease with heart failure and stage 1 through stage 4 chronic kidney disease, or unspecified chronic kidney disease: Secondary | ICD-10-CM | POA: Diagnosis not present

## 2024-06-04 NOTE — Telephone Encounter (Signed)
 Pharmacy Patient Advocate Encounter  Received notification from [employer] that Prior Authorization for sildenafil   has been APPROVED from 06/04/24 to 09/09/24. Unable to obtain price due to refill too soon rejection, last fill date 06/04/24 next available fill date10/18/25   PA #/Case ID/Reference #: [id number]

## 2024-06-04 NOTE — Telephone Encounter (Signed)
 Pharmacy Patient Advocate Encounter   Received notification from Fax that prior authorization for sildenafil  is required/requested.   Insurance verification completed.   The patient is insured through [employer] .   Per test claim: PA required; PA submitted to above mentioned insurance via Latent Key/confirmation #/EOC [id number] Status is pending

## 2024-06-05 DIAGNOSIS — M6281 Muscle weakness (generalized): Secondary | ICD-10-CM | POA: Diagnosis not present

## 2024-06-09 DIAGNOSIS — K5900 Constipation, unspecified: Secondary | ICD-10-CM | POA: Diagnosis not present

## 2024-06-11 DIAGNOSIS — I10 Essential (primary) hypertension: Secondary | ICD-10-CM | POA: Diagnosis not present

## 2024-06-11 DIAGNOSIS — I482 Chronic atrial fibrillation, unspecified: Secondary | ICD-10-CM | POA: Diagnosis not present

## 2024-06-11 DIAGNOSIS — R001 Bradycardia, unspecified: Secondary | ICD-10-CM | POA: Diagnosis not present

## 2024-06-11 DIAGNOSIS — I2510 Atherosclerotic heart disease of native coronary artery without angina pectoris: Secondary | ICD-10-CM | POA: Diagnosis not present

## 2024-06-15 DIAGNOSIS — I482 Chronic atrial fibrillation, unspecified: Secondary | ICD-10-CM | POA: Diagnosis not present

## 2024-06-15 DIAGNOSIS — R001 Bradycardia, unspecified: Secondary | ICD-10-CM | POA: Diagnosis not present

## 2024-06-15 DIAGNOSIS — I10 Essential (primary) hypertension: Secondary | ICD-10-CM | POA: Diagnosis not present

## 2024-06-19 DIAGNOSIS — K5900 Constipation, unspecified: Secondary | ICD-10-CM | POA: Diagnosis not present

## 2024-06-19 DIAGNOSIS — Z7901 Long term (current) use of anticoagulants: Secondary | ICD-10-CM | POA: Diagnosis not present

## 2024-06-19 DIAGNOSIS — D509 Iron deficiency anemia, unspecified: Secondary | ICD-10-CM | POA: Diagnosis not present

## 2024-06-19 DIAGNOSIS — R001 Bradycardia, unspecified: Secondary | ICD-10-CM | POA: Diagnosis not present

## 2024-06-19 DIAGNOSIS — I482 Chronic atrial fibrillation, unspecified: Secondary | ICD-10-CM | POA: Diagnosis not present

## 2024-06-19 DIAGNOSIS — N183 Chronic kidney disease, stage 3 unspecified: Secondary | ICD-10-CM | POA: Diagnosis not present

## 2024-06-19 DIAGNOSIS — G629 Polyneuropathy, unspecified: Secondary | ICD-10-CM | POA: Diagnosis not present

## 2024-06-19 DIAGNOSIS — Z5919 Other inadequate housing: Secondary | ICD-10-CM | POA: Diagnosis not present

## 2024-06-19 DIAGNOSIS — I5042 Chronic combined systolic (congestive) and diastolic (congestive) heart failure: Secondary | ICD-10-CM | POA: Diagnosis not present

## 2024-06-19 DIAGNOSIS — N390 Urinary tract infection, site not specified: Secondary | ICD-10-CM | POA: Diagnosis not present

## 2024-06-19 DIAGNOSIS — I10 Essential (primary) hypertension: Secondary | ICD-10-CM | POA: Diagnosis not present

## 2024-06-19 DIAGNOSIS — Z9181 History of falling: Secondary | ICD-10-CM | POA: Diagnosis not present

## 2024-06-19 DIAGNOSIS — I2510 Atherosclerotic heart disease of native coronary artery without angina pectoris: Secondary | ICD-10-CM | POA: Diagnosis not present

## 2024-06-19 DIAGNOSIS — I130 Hypertensive heart and chronic kidney disease with heart failure and stage 1 through stage 4 chronic kidney disease, or unspecified chronic kidney disease: Secondary | ICD-10-CM | POA: Diagnosis not present

## 2024-06-19 DIAGNOSIS — F32A Depression, unspecified: Secondary | ICD-10-CM | POA: Diagnosis not present

## 2024-06-19 DIAGNOSIS — I272 Pulmonary hypertension, unspecified: Secondary | ICD-10-CM | POA: Diagnosis not present

## 2024-06-19 DIAGNOSIS — F419 Anxiety disorder, unspecified: Secondary | ICD-10-CM | POA: Diagnosis not present

## 2024-06-22 DIAGNOSIS — F32A Depression, unspecified: Secondary | ICD-10-CM | POA: Diagnosis not present

## 2024-06-22 DIAGNOSIS — I482 Chronic atrial fibrillation, unspecified: Secondary | ICD-10-CM | POA: Diagnosis not present

## 2024-06-22 DIAGNOSIS — Z7901 Long term (current) use of anticoagulants: Secondary | ICD-10-CM | POA: Diagnosis not present

## 2024-06-22 DIAGNOSIS — I10 Essential (primary) hypertension: Secondary | ICD-10-CM | POA: Diagnosis not present

## 2024-06-22 DIAGNOSIS — D509 Iron deficiency anemia, unspecified: Secondary | ICD-10-CM | POA: Diagnosis not present

## 2024-06-22 DIAGNOSIS — N183 Chronic kidney disease, stage 3 unspecified: Secondary | ICD-10-CM | POA: Diagnosis not present

## 2024-06-22 DIAGNOSIS — F419 Anxiety disorder, unspecified: Secondary | ICD-10-CM | POA: Diagnosis not present

## 2024-06-22 DIAGNOSIS — I272 Pulmonary hypertension, unspecified: Secondary | ICD-10-CM | POA: Diagnosis not present

## 2024-06-22 DIAGNOSIS — K5900 Constipation, unspecified: Secondary | ICD-10-CM | POA: Diagnosis not present

## 2024-06-22 DIAGNOSIS — I130 Hypertensive heart and chronic kidney disease with heart failure and stage 1 through stage 4 chronic kidney disease, or unspecified chronic kidney disease: Secondary | ICD-10-CM | POA: Diagnosis not present

## 2024-06-22 DIAGNOSIS — N390 Urinary tract infection, site not specified: Secondary | ICD-10-CM | POA: Diagnosis not present

## 2024-06-22 DIAGNOSIS — Z5919 Other inadequate housing: Secondary | ICD-10-CM | POA: Diagnosis not present

## 2024-06-22 DIAGNOSIS — Z9181 History of falling: Secondary | ICD-10-CM | POA: Diagnosis not present

## 2024-06-22 DIAGNOSIS — I2510 Atherosclerotic heart disease of native coronary artery without angina pectoris: Secondary | ICD-10-CM | POA: Diagnosis not present

## 2024-06-22 DIAGNOSIS — R001 Bradycardia, unspecified: Secondary | ICD-10-CM | POA: Diagnosis not present

## 2024-06-22 DIAGNOSIS — I5042 Chronic combined systolic (congestive) and diastolic (congestive) heart failure: Secondary | ICD-10-CM | POA: Diagnosis not present

## 2024-06-22 DIAGNOSIS — G629 Polyneuropathy, unspecified: Secondary | ICD-10-CM | POA: Diagnosis not present

## 2024-06-24 ENCOUNTER — Ambulatory Visit: Admitting: Nurse Practitioner

## 2024-06-24 NOTE — Progress Notes (Deleted)
 Office Visit    Patient Name: Jordan Perkins Date of Encounter: 06/24/2024  Primary Care Provider:  Patient, No Pcp Per Primary Cardiologist:  Evalene Lunger, MD  Cardiology APP:  Vannie Reche RAMAN, NP   Chief Complaint    83 y.o. male   Past Medical History   Subjective   Past Medical History:  Diagnosis Date   (HFpEF) heart failure with preserved ejection fraction (HCC)    CHF (congestive heart failure) (HCC)    Coronary artery disease    Hyperlipidemia    Hypertension    PAH (pulmonary artery hypertension) (HCC)    Permanent atrial fibrillation (HCC)    Pleural effusion    Past Surgical History:  Procedure Laterality Date   CARDIAC CATHETERIZATION     CHOLECYSTECTOMY     COLONOSCOPY WITH PROPOFOL  N/A 08/23/2020   Procedure: COLONOSCOPY WITH PROPOFOL ;  Surgeon: Unk Corinn Skiff, MD;  Location: [facility] ENDOSCOPY;  Service: Gastroenterology;  Laterality: N/A;   CORONARY ANGIOPLASTY  06/2004   s/p stent placement @ [facility]   CORONARY ARTERY BYPASS GRAFT  04-24-2004   CABG x 3 [facility]   ESOPHAGOGASTRODUODENOSCOPY (EGD) WITH PROPOFOL  N/A 08/23/2020   Procedure: ESOPHAGOGASTRODUODENOSCOPY (EGD) WITH PROPOFOL ;  Surgeon: Unk Corinn Skiff, MD;  Location: [facility] ENDOSCOPY;  Service: Gastroenterology;  Laterality: N/A;  Needs to be last case   RIGHT HEART CATH Right 06/09/2021   Procedure: RIGHT HEART CATH;  Surgeon: Mady Bruckner, MD;  Location: [facility] INVASIVE CV LAB;  Service: Cardiovascular;  Laterality: Right;    Allergies  Allergies  Allergen Reactions   Sulfa Antibiotics Rash    Rash/hives        History of Present Illness      83 y.o. y/o male {There is no content from the last Narrative History section.}     Objective   Home Medications    Current Outpatient Medications  Medication Sig Dispense Refill   amLODipine  (NORVASC ) 2.5 MG tablet Take 1 tablet (2.5 mg total) by mouth daily. 180 tablet 3   atorvastatin  (LIPITOR ) 80 MG tablet Take 1 tablet  (80 mg total) by mouth daily. 90 tablet 1   Blood Pressure Monitoring (BLOOD PRESSURE CUFF) MISC Check blood pressure as instructed by your physician 1 each 0   ferrous sulfate  325 (65 FE) MG tablet TAKE 1 TABLET BY MOUTH EVERY DAY 90 tablet 1   gabapentin  (NEURONTIN ) 100 MG capsule Take 2 capsules (200 mg total) by mouth 2 (two) times daily. 360 capsule 1   hydrocortisone  1 % ointment Apply 1 Application topically 2 (two) times daily. 30 g 0   ketoconazole  (NIZORAL ) 2 % shampoo APPLY 1 APPLICATION TOPICALLY 2 (TWO) TIMES A WEEK. 120 mL 0   methocarbamol  (ROBAXIN ) 500 MG tablet Take 1 tablet (500 mg total) by mouth every 8 (eight) hours as needed for muscle spasms. 15 tablet 0   pantoprazole  (PROTONIX ) 40 MG tablet Take 1 tablet (40 mg total) by mouth daily. 90 tablet 2   polyethylene glycol powder (GAVILAX) 17 GM/SCOOP powder TAKE 17 G BY MOUTH DAILY AS NEEDED FOR MILD CONSTIPATION. 510 g 1   Rivaroxaban  (XARELTO ) 15 MG TABS tablet TAKE 1 TABLET (15 MG TOTAL) BY MOUTH DAILY WITH SUPPER 90 tablet 3   sildenafil  (REVATIO ) 20 MG tablet Take 1 tablet (20 mg total) by mouth 3 (three) times daily. 90 tablet 1   torsemide  (DEMADEX ) 20 MG tablet Take 2 tablets (40 mg total) by mouth 2 (two) times daily. 360 tablet 0  No current facility-administered medications for this visit.     Physical Exam    VS:  There were no vitals taken for this visit. , BMI There is no height or weight on file to calculate BMI.          GEN: Well nourished, well developed, in no acute distress. HEENT: normal. Neck: Supple, no JVD, carotid bruits, or masses. Cardiac: RRR, no murmurs, rubs, or gallops. No clubbing, cyanosis, edema.  Radials 2+/PT 2+ and equal bilaterally.  Respiratory:  Respirations regular and unlabored, clear to auscultation bilaterally. GI: Soft, nontender, nondistended, BS + x 4. MS: no deformity or atrophy. Skin: warm and dry, no rash. Neuro:  Strength and sensation are intact. Psych: Normal  affect.  Accessory Clinical Findings    ECG personally reviewed by me today -    *** - no acute changes.  Lab Results  Component Value Date   WBC 5.3 10/16/2023   HGB 12.4 (L) 10/16/2023   HCT 35.3 (L) 10/16/2023   MCV 98 (H) 10/16/2023   PLT 116 (L) 10/16/2023   Lab Results  Component Value Date   CREATININE 1.36 (H) 10/16/2023   BUN 42 (H) 10/16/2023   NA 135 10/16/2023   K 4.3 10/16/2023   CL 98 10/16/2023   CO2 23 10/16/2023   Lab Results  Component Value Date   ALT 8 06/04/2023   AST 19 06/04/2023   ALKPHOS 80 06/04/2023   BILITOT 1.5 (H) 06/04/2023   Lab Results  Component Value Date   CHOL 135 06/04/2023   HDL 36.70 (L) 06/04/2023   LDLCALC 80 06/04/2023   TRIG 88.0 06/04/2023   CHOLHDL 4 06/04/2023    Lab Results  Component Value Date   HGBA1C 5.6 12/05/2022   Lab Results  Component Value Date   TSH 0.648 02/17/2019       Assessment & Plan    1.  ***  Lonni Meager, NP 06/24/2024, 10:39 AM

## 2024-06-26 DIAGNOSIS — I130 Hypertensive heart and chronic kidney disease with heart failure and stage 1 through stage 4 chronic kidney disease, or unspecified chronic kidney disease: Secondary | ICD-10-CM | POA: Diagnosis not present

## 2024-06-26 DIAGNOSIS — G629 Polyneuropathy, unspecified: Secondary | ICD-10-CM | POA: Diagnosis not present

## 2024-06-26 DIAGNOSIS — N390 Urinary tract infection, site not specified: Secondary | ICD-10-CM | POA: Diagnosis not present

## 2024-06-26 DIAGNOSIS — I10 Essential (primary) hypertension: Secondary | ICD-10-CM | POA: Diagnosis not present

## 2024-06-26 DIAGNOSIS — I2510 Atherosclerotic heart disease of native coronary artery without angina pectoris: Secondary | ICD-10-CM | POA: Diagnosis not present

## 2024-06-26 DIAGNOSIS — I5042 Chronic combined systolic (congestive) and diastolic (congestive) heart failure: Secondary | ICD-10-CM | POA: Diagnosis not present

## 2024-06-26 DIAGNOSIS — F32A Depression, unspecified: Secondary | ICD-10-CM | POA: Diagnosis not present

## 2024-06-26 DIAGNOSIS — I482 Chronic atrial fibrillation, unspecified: Secondary | ICD-10-CM | POA: Diagnosis not present

## 2024-06-26 DIAGNOSIS — R001 Bradycardia, unspecified: Secondary | ICD-10-CM | POA: Diagnosis not present

## 2024-06-26 DIAGNOSIS — Z7901 Long term (current) use of anticoagulants: Secondary | ICD-10-CM | POA: Diagnosis not present

## 2024-06-26 DIAGNOSIS — Z9181 History of falling: Secondary | ICD-10-CM | POA: Diagnosis not present

## 2024-06-26 DIAGNOSIS — D509 Iron deficiency anemia, unspecified: Secondary | ICD-10-CM | POA: Diagnosis not present

## 2024-06-26 DIAGNOSIS — Z5919 Other inadequate housing: Secondary | ICD-10-CM | POA: Diagnosis not present

## 2024-06-26 DIAGNOSIS — K5900 Constipation, unspecified: Secondary | ICD-10-CM | POA: Diagnosis not present

## 2024-06-26 DIAGNOSIS — I272 Pulmonary hypertension, unspecified: Secondary | ICD-10-CM | POA: Diagnosis not present

## 2024-06-26 DIAGNOSIS — N183 Chronic kidney disease, stage 3 unspecified: Secondary | ICD-10-CM | POA: Diagnosis not present

## 2024-06-26 DIAGNOSIS — F419 Anxiety disorder, unspecified: Secondary | ICD-10-CM | POA: Diagnosis not present

## 2024-06-27 ENCOUNTER — Other Ambulatory Visit: Payer: Self-pay | Admitting: Cardiology

## 2024-06-29 DIAGNOSIS — I130 Hypertensive heart and chronic kidney disease with heart failure and stage 1 through stage 4 chronic kidney disease, or unspecified chronic kidney disease: Secondary | ICD-10-CM | POA: Diagnosis not present

## 2024-06-29 DIAGNOSIS — N183 Chronic kidney disease, stage 3 unspecified: Secondary | ICD-10-CM | POA: Diagnosis not present

## 2024-06-29 DIAGNOSIS — R001 Bradycardia, unspecified: Secondary | ICD-10-CM | POA: Diagnosis not present

## 2024-06-29 DIAGNOSIS — I482 Chronic atrial fibrillation, unspecified: Secondary | ICD-10-CM | POA: Diagnosis not present

## 2024-06-29 DIAGNOSIS — I5042 Chronic combined systolic (congestive) and diastolic (congestive) heart failure: Secondary | ICD-10-CM | POA: Diagnosis not present

## 2024-06-29 DIAGNOSIS — Z5919 Other inadequate housing: Secondary | ICD-10-CM | POA: Diagnosis not present

## 2024-06-29 DIAGNOSIS — N390 Urinary tract infection, site not specified: Secondary | ICD-10-CM | POA: Diagnosis not present

## 2024-06-29 DIAGNOSIS — I10 Essential (primary) hypertension: Secondary | ICD-10-CM | POA: Diagnosis not present

## 2024-06-29 DIAGNOSIS — K5900 Constipation, unspecified: Secondary | ICD-10-CM | POA: Diagnosis not present

## 2024-06-29 DIAGNOSIS — I2510 Atherosclerotic heart disease of native coronary artery without angina pectoris: Secondary | ICD-10-CM | POA: Diagnosis not present

## 2024-06-29 DIAGNOSIS — F32A Depression, unspecified: Secondary | ICD-10-CM | POA: Diagnosis not present

## 2024-06-29 DIAGNOSIS — Z7901 Long term (current) use of anticoagulants: Secondary | ICD-10-CM | POA: Diagnosis not present

## 2024-06-29 DIAGNOSIS — I272 Pulmonary hypertension, unspecified: Secondary | ICD-10-CM | POA: Diagnosis not present

## 2024-06-29 DIAGNOSIS — F419 Anxiety disorder, unspecified: Secondary | ICD-10-CM | POA: Diagnosis not present

## 2024-06-29 DIAGNOSIS — Z9181 History of falling: Secondary | ICD-10-CM | POA: Diagnosis not present

## 2024-06-29 DIAGNOSIS — G629 Polyneuropathy, unspecified: Secondary | ICD-10-CM | POA: Diagnosis not present

## 2024-06-29 DIAGNOSIS — D509 Iron deficiency anemia, unspecified: Secondary | ICD-10-CM | POA: Diagnosis not present

## 2024-06-30 DIAGNOSIS — I272 Pulmonary hypertension, unspecified: Secondary | ICD-10-CM | POA: Diagnosis not present

## 2024-06-30 DIAGNOSIS — F32A Depression, unspecified: Secondary | ICD-10-CM | POA: Diagnosis not present

## 2024-06-30 DIAGNOSIS — G629 Polyneuropathy, unspecified: Secondary | ICD-10-CM | POA: Diagnosis not present

## 2024-06-30 DIAGNOSIS — N390 Urinary tract infection, site not specified: Secondary | ICD-10-CM | POA: Diagnosis not present

## 2024-06-30 DIAGNOSIS — I130 Hypertensive heart and chronic kidney disease with heart failure and stage 1 through stage 4 chronic kidney disease, or unspecified chronic kidney disease: Secondary | ICD-10-CM | POA: Diagnosis not present

## 2024-06-30 DIAGNOSIS — I5042 Chronic combined systolic (congestive) and diastolic (congestive) heart failure: Secondary | ICD-10-CM | POA: Diagnosis not present

## 2024-06-30 DIAGNOSIS — Z9181 History of falling: Secondary | ICD-10-CM | POA: Diagnosis not present

## 2024-06-30 DIAGNOSIS — I2510 Atherosclerotic heart disease of native coronary artery without angina pectoris: Secondary | ICD-10-CM | POA: Diagnosis not present

## 2024-06-30 DIAGNOSIS — N183 Chronic kidney disease, stage 3 unspecified: Secondary | ICD-10-CM | POA: Diagnosis not present

## 2024-06-30 DIAGNOSIS — I10 Essential (primary) hypertension: Secondary | ICD-10-CM | POA: Diagnosis not present

## 2024-06-30 DIAGNOSIS — K5900 Constipation, unspecified: Secondary | ICD-10-CM | POA: Diagnosis not present

## 2024-06-30 DIAGNOSIS — F419 Anxiety disorder, unspecified: Secondary | ICD-10-CM | POA: Diagnosis not present

## 2024-06-30 DIAGNOSIS — Z5919 Other inadequate housing: Secondary | ICD-10-CM | POA: Diagnosis not present

## 2024-06-30 DIAGNOSIS — I482 Chronic atrial fibrillation, unspecified: Secondary | ICD-10-CM | POA: Diagnosis not present

## 2024-06-30 DIAGNOSIS — R001 Bradycardia, unspecified: Secondary | ICD-10-CM | POA: Diagnosis not present

## 2024-06-30 DIAGNOSIS — Z7901 Long term (current) use of anticoagulants: Secondary | ICD-10-CM | POA: Diagnosis not present

## 2024-06-30 DIAGNOSIS — D509 Iron deficiency anemia, unspecified: Secondary | ICD-10-CM | POA: Diagnosis not present

## 2024-07-06 DIAGNOSIS — F419 Anxiety disorder, unspecified: Secondary | ICD-10-CM | POA: Diagnosis not present

## 2024-07-06 DIAGNOSIS — Z9181 History of falling: Secondary | ICD-10-CM | POA: Diagnosis not present

## 2024-07-06 DIAGNOSIS — D509 Iron deficiency anemia, unspecified: Secondary | ICD-10-CM | POA: Diagnosis not present

## 2024-07-06 DIAGNOSIS — R001 Bradycardia, unspecified: Secondary | ICD-10-CM | POA: Diagnosis not present

## 2024-07-06 DIAGNOSIS — I5042 Chronic combined systolic (congestive) and diastolic (congestive) heart failure: Secondary | ICD-10-CM | POA: Diagnosis not present

## 2024-07-06 DIAGNOSIS — F32A Depression, unspecified: Secondary | ICD-10-CM | POA: Diagnosis not present

## 2024-07-06 DIAGNOSIS — I10 Essential (primary) hypertension: Secondary | ICD-10-CM | POA: Diagnosis not present

## 2024-07-06 DIAGNOSIS — I482 Chronic atrial fibrillation, unspecified: Secondary | ICD-10-CM | POA: Diagnosis not present

## 2024-07-06 DIAGNOSIS — K5900 Constipation, unspecified: Secondary | ICD-10-CM | POA: Diagnosis not present

## 2024-07-06 DIAGNOSIS — I2510 Atherosclerotic heart disease of native coronary artery without angina pectoris: Secondary | ICD-10-CM | POA: Diagnosis not present

## 2024-07-06 DIAGNOSIS — G629 Polyneuropathy, unspecified: Secondary | ICD-10-CM | POA: Diagnosis not present

## 2024-07-06 DIAGNOSIS — Z7901 Long term (current) use of anticoagulants: Secondary | ICD-10-CM | POA: Diagnosis not present

## 2024-07-06 DIAGNOSIS — I272 Pulmonary hypertension, unspecified: Secondary | ICD-10-CM | POA: Diagnosis not present

## 2024-07-06 DIAGNOSIS — I130 Hypertensive heart and chronic kidney disease with heart failure and stage 1 through stage 4 chronic kidney disease, or unspecified chronic kidney disease: Secondary | ICD-10-CM | POA: Diagnosis not present

## 2024-07-06 DIAGNOSIS — Z5919 Other inadequate housing: Secondary | ICD-10-CM | POA: Diagnosis not present

## 2024-07-06 DIAGNOSIS — N390 Urinary tract infection, site not specified: Secondary | ICD-10-CM | POA: Diagnosis not present

## 2024-07-06 DIAGNOSIS — N183 Chronic kidney disease, stage 3 unspecified: Secondary | ICD-10-CM | POA: Diagnosis not present

## 2024-07-07 DIAGNOSIS — Z9181 History of falling: Secondary | ICD-10-CM | POA: Diagnosis not present

## 2024-07-07 DIAGNOSIS — I5042 Chronic combined systolic (congestive) and diastolic (congestive) heart failure: Secondary | ICD-10-CM | POA: Diagnosis not present

## 2024-07-07 DIAGNOSIS — Z7901 Long term (current) use of anticoagulants: Secondary | ICD-10-CM | POA: Diagnosis not present

## 2024-07-07 DIAGNOSIS — K5900 Constipation, unspecified: Secondary | ICD-10-CM | POA: Diagnosis not present

## 2024-07-07 DIAGNOSIS — I482 Chronic atrial fibrillation, unspecified: Secondary | ICD-10-CM | POA: Diagnosis not present

## 2024-07-07 DIAGNOSIS — I130 Hypertensive heart and chronic kidney disease with heart failure and stage 1 through stage 4 chronic kidney disease, or unspecified chronic kidney disease: Secondary | ICD-10-CM | POA: Diagnosis not present

## 2024-07-07 DIAGNOSIS — G629 Polyneuropathy, unspecified: Secondary | ICD-10-CM | POA: Diagnosis not present

## 2024-07-07 DIAGNOSIS — Z5919 Other inadequate housing: Secondary | ICD-10-CM | POA: Diagnosis not present

## 2024-07-07 DIAGNOSIS — N183 Chronic kidney disease, stage 3 unspecified: Secondary | ICD-10-CM | POA: Diagnosis not present

## 2024-07-07 DIAGNOSIS — F419 Anxiety disorder, unspecified: Secondary | ICD-10-CM | POA: Diagnosis not present

## 2024-07-07 DIAGNOSIS — N390 Urinary tract infection, site not specified: Secondary | ICD-10-CM | POA: Diagnosis not present

## 2024-07-07 DIAGNOSIS — I10 Essential (primary) hypertension: Secondary | ICD-10-CM | POA: Diagnosis not present

## 2024-07-07 DIAGNOSIS — F32A Depression, unspecified: Secondary | ICD-10-CM | POA: Diagnosis not present

## 2024-07-07 DIAGNOSIS — D509 Iron deficiency anemia, unspecified: Secondary | ICD-10-CM | POA: Diagnosis not present

## 2024-07-07 DIAGNOSIS — R001 Bradycardia, unspecified: Secondary | ICD-10-CM | POA: Diagnosis not present

## 2024-07-07 DIAGNOSIS — I272 Pulmonary hypertension, unspecified: Secondary | ICD-10-CM | POA: Diagnosis not present

## 2024-07-07 DIAGNOSIS — I2510 Atherosclerotic heart disease of native coronary artery without angina pectoris: Secondary | ICD-10-CM | POA: Diagnosis not present

## 2024-07-08 DIAGNOSIS — F32A Depression, unspecified: Secondary | ICD-10-CM | POA: Diagnosis not present

## 2024-07-08 DIAGNOSIS — I130 Hypertensive heart and chronic kidney disease with heart failure and stage 1 through stage 4 chronic kidney disease, or unspecified chronic kidney disease: Secondary | ICD-10-CM | POA: Diagnosis not present

## 2024-07-08 DIAGNOSIS — N183 Chronic kidney disease, stage 3 unspecified: Secondary | ICD-10-CM | POA: Diagnosis not present

## 2024-07-08 DIAGNOSIS — G629 Polyneuropathy, unspecified: Secondary | ICD-10-CM | POA: Diagnosis not present

## 2024-07-08 DIAGNOSIS — I482 Chronic atrial fibrillation, unspecified: Secondary | ICD-10-CM | POA: Diagnosis not present

## 2024-07-08 DIAGNOSIS — I5042 Chronic combined systolic (congestive) and diastolic (congestive) heart failure: Secondary | ICD-10-CM | POA: Diagnosis not present

## 2024-07-08 DIAGNOSIS — I272 Pulmonary hypertension, unspecified: Secondary | ICD-10-CM | POA: Diagnosis not present

## 2024-07-09 DIAGNOSIS — I482 Chronic atrial fibrillation, unspecified: Secondary | ICD-10-CM | POA: Diagnosis not present

## 2024-07-09 DIAGNOSIS — I10 Essential (primary) hypertension: Secondary | ICD-10-CM | POA: Diagnosis not present

## 2024-07-09 DIAGNOSIS — I272 Pulmonary hypertension, unspecified: Secondary | ICD-10-CM | POA: Diagnosis not present

## 2024-07-09 DIAGNOSIS — N183 Chronic kidney disease, stage 3 unspecified: Secondary | ICD-10-CM | POA: Diagnosis not present

## 2024-07-09 DIAGNOSIS — K5900 Constipation, unspecified: Secondary | ICD-10-CM | POA: Diagnosis not present

## 2024-07-09 DIAGNOSIS — Z9181 History of falling: Secondary | ICD-10-CM | POA: Diagnosis not present

## 2024-07-09 DIAGNOSIS — R001 Bradycardia, unspecified: Secondary | ICD-10-CM | POA: Diagnosis not present

## 2024-07-09 DIAGNOSIS — N390 Urinary tract infection, site not specified: Secondary | ICD-10-CM | POA: Diagnosis not present

## 2024-07-09 DIAGNOSIS — G629 Polyneuropathy, unspecified: Secondary | ICD-10-CM | POA: Diagnosis not present

## 2024-07-09 DIAGNOSIS — Z7901 Long term (current) use of anticoagulants: Secondary | ICD-10-CM | POA: Diagnosis not present

## 2024-07-09 DIAGNOSIS — F32A Depression, unspecified: Secondary | ICD-10-CM | POA: Diagnosis not present

## 2024-07-09 DIAGNOSIS — I130 Hypertensive heart and chronic kidney disease with heart failure and stage 1 through stage 4 chronic kidney disease, or unspecified chronic kidney disease: Secondary | ICD-10-CM | POA: Diagnosis not present

## 2024-07-09 DIAGNOSIS — D509 Iron deficiency anemia, unspecified: Secondary | ICD-10-CM | POA: Diagnosis not present

## 2024-07-09 DIAGNOSIS — F419 Anxiety disorder, unspecified: Secondary | ICD-10-CM | POA: Diagnosis not present

## 2024-07-09 DIAGNOSIS — I2510 Atherosclerotic heart disease of native coronary artery without angina pectoris: Secondary | ICD-10-CM | POA: Diagnosis not present

## 2024-07-09 DIAGNOSIS — Z5919 Other inadequate housing: Secondary | ICD-10-CM | POA: Diagnosis not present

## 2024-07-09 DIAGNOSIS — I5042 Chronic combined systolic (congestive) and diastolic (congestive) heart failure: Secondary | ICD-10-CM | POA: Diagnosis not present

## 2024-07-13 DIAGNOSIS — I272 Pulmonary hypertension, unspecified: Secondary | ICD-10-CM | POA: Diagnosis not present

## 2024-07-13 DIAGNOSIS — I5042 Chronic combined systolic (congestive) and diastolic (congestive) heart failure: Secondary | ICD-10-CM | POA: Diagnosis not present

## 2024-07-13 DIAGNOSIS — F419 Anxiety disorder, unspecified: Secondary | ICD-10-CM | POA: Diagnosis not present

## 2024-07-13 DIAGNOSIS — Z9181 History of falling: Secondary | ICD-10-CM | POA: Diagnosis not present

## 2024-07-13 DIAGNOSIS — N183 Chronic kidney disease, stage 3 unspecified: Secondary | ICD-10-CM | POA: Diagnosis not present

## 2024-07-13 DIAGNOSIS — D509 Iron deficiency anemia, unspecified: Secondary | ICD-10-CM | POA: Diagnosis not present

## 2024-07-13 DIAGNOSIS — K5900 Constipation, unspecified: Secondary | ICD-10-CM | POA: Diagnosis not present

## 2024-07-13 DIAGNOSIS — Z7901 Long term (current) use of anticoagulants: Secondary | ICD-10-CM | POA: Diagnosis not present

## 2024-07-13 DIAGNOSIS — N390 Urinary tract infection, site not specified: Secondary | ICD-10-CM | POA: Diagnosis not present

## 2024-07-13 DIAGNOSIS — Z5919 Other inadequate housing: Secondary | ICD-10-CM | POA: Diagnosis not present

## 2024-07-13 DIAGNOSIS — F32A Depression, unspecified: Secondary | ICD-10-CM | POA: Diagnosis not present

## 2024-07-13 DIAGNOSIS — I482 Chronic atrial fibrillation, unspecified: Secondary | ICD-10-CM | POA: Diagnosis not present

## 2024-07-13 DIAGNOSIS — I130 Hypertensive heart and chronic kidney disease with heart failure and stage 1 through stage 4 chronic kidney disease, or unspecified chronic kidney disease: Secondary | ICD-10-CM | POA: Diagnosis not present

## 2024-07-13 DIAGNOSIS — R001 Bradycardia, unspecified: Secondary | ICD-10-CM | POA: Diagnosis not present

## 2024-07-13 DIAGNOSIS — G629 Polyneuropathy, unspecified: Secondary | ICD-10-CM | POA: Diagnosis not present

## 2024-07-13 DIAGNOSIS — I10 Essential (primary) hypertension: Secondary | ICD-10-CM | POA: Diagnosis not present

## 2024-07-13 DIAGNOSIS — I2510 Atherosclerotic heart disease of native coronary artery without angina pectoris: Secondary | ICD-10-CM | POA: Diagnosis not present

## 2024-07-16 DIAGNOSIS — D509 Iron deficiency anemia, unspecified: Secondary | ICD-10-CM | POA: Diagnosis not present

## 2024-07-16 DIAGNOSIS — R001 Bradycardia, unspecified: Secondary | ICD-10-CM | POA: Diagnosis not present

## 2024-07-16 DIAGNOSIS — Z9181 History of falling: Secondary | ICD-10-CM | POA: Diagnosis not present

## 2024-07-16 DIAGNOSIS — I482 Chronic atrial fibrillation, unspecified: Secondary | ICD-10-CM | POA: Diagnosis not present

## 2024-07-16 DIAGNOSIS — G629 Polyneuropathy, unspecified: Secondary | ICD-10-CM | POA: Diagnosis not present

## 2024-07-16 DIAGNOSIS — I130 Hypertensive heart and chronic kidney disease with heart failure and stage 1 through stage 4 chronic kidney disease, or unspecified chronic kidney disease: Secondary | ICD-10-CM | POA: Diagnosis not present

## 2024-07-16 DIAGNOSIS — K5900 Constipation, unspecified: Secondary | ICD-10-CM | POA: Diagnosis not present

## 2024-07-16 DIAGNOSIS — I272 Pulmonary hypertension, unspecified: Secondary | ICD-10-CM | POA: Diagnosis not present

## 2024-07-16 DIAGNOSIS — I5042 Chronic combined systolic (congestive) and diastolic (congestive) heart failure: Secondary | ICD-10-CM | POA: Diagnosis not present

## 2024-07-16 DIAGNOSIS — F419 Anxiety disorder, unspecified: Secondary | ICD-10-CM | POA: Diagnosis not present

## 2024-07-16 DIAGNOSIS — N183 Chronic kidney disease, stage 3 unspecified: Secondary | ICD-10-CM | POA: Diagnosis not present

## 2024-07-16 DIAGNOSIS — I2510 Atherosclerotic heart disease of native coronary artery without angina pectoris: Secondary | ICD-10-CM | POA: Diagnosis not present

## 2024-07-16 DIAGNOSIS — Z7901 Long term (current) use of anticoagulants: Secondary | ICD-10-CM | POA: Diagnosis not present

## 2024-07-16 DIAGNOSIS — Z5919 Other inadequate housing: Secondary | ICD-10-CM | POA: Diagnosis not present

## 2024-07-16 DIAGNOSIS — N390 Urinary tract infection, site not specified: Secondary | ICD-10-CM | POA: Diagnosis not present

## 2024-07-16 DIAGNOSIS — I10 Essential (primary) hypertension: Secondary | ICD-10-CM | POA: Diagnosis not present

## 2024-07-16 DIAGNOSIS — F32A Depression, unspecified: Secondary | ICD-10-CM | POA: Diagnosis not present

## 2024-07-20 DIAGNOSIS — D509 Iron deficiency anemia, unspecified: Secondary | ICD-10-CM | POA: Diagnosis not present

## 2024-07-20 DIAGNOSIS — G629 Polyneuropathy, unspecified: Secondary | ICD-10-CM | POA: Diagnosis not present

## 2024-07-20 DIAGNOSIS — F32A Depression, unspecified: Secondary | ICD-10-CM | POA: Diagnosis not present

## 2024-07-20 DIAGNOSIS — I2510 Atherosclerotic heart disease of native coronary artery without angina pectoris: Secondary | ICD-10-CM | POA: Diagnosis not present

## 2024-07-20 DIAGNOSIS — Z7901 Long term (current) use of anticoagulants: Secondary | ICD-10-CM | POA: Diagnosis not present

## 2024-07-20 DIAGNOSIS — I130 Hypertensive heart and chronic kidney disease with heart failure and stage 1 through stage 4 chronic kidney disease, or unspecified chronic kidney disease: Secondary | ICD-10-CM | POA: Diagnosis not present

## 2024-07-20 DIAGNOSIS — I5042 Chronic combined systolic (congestive) and diastolic (congestive) heart failure: Secondary | ICD-10-CM | POA: Diagnosis not present

## 2024-07-20 DIAGNOSIS — K5900 Constipation, unspecified: Secondary | ICD-10-CM | POA: Diagnosis not present

## 2024-07-20 DIAGNOSIS — I272 Pulmonary hypertension, unspecified: Secondary | ICD-10-CM | POA: Diagnosis not present

## 2024-07-20 DIAGNOSIS — I10 Essential (primary) hypertension: Secondary | ICD-10-CM | POA: Diagnosis not present

## 2024-07-20 DIAGNOSIS — R001 Bradycardia, unspecified: Secondary | ICD-10-CM | POA: Diagnosis not present

## 2024-07-20 DIAGNOSIS — I482 Chronic atrial fibrillation, unspecified: Secondary | ICD-10-CM | POA: Diagnosis not present

## 2024-07-20 DIAGNOSIS — F419 Anxiety disorder, unspecified: Secondary | ICD-10-CM | POA: Diagnosis not present

## 2024-07-20 DIAGNOSIS — N390 Urinary tract infection, site not specified: Secondary | ICD-10-CM | POA: Diagnosis not present

## 2024-07-20 DIAGNOSIS — N183 Chronic kidney disease, stage 3 unspecified: Secondary | ICD-10-CM | POA: Diagnosis not present

## 2024-07-20 DIAGNOSIS — Z9181 History of falling: Secondary | ICD-10-CM | POA: Diagnosis not present

## 2024-07-20 DIAGNOSIS — Z5919 Other inadequate housing: Secondary | ICD-10-CM | POA: Diagnosis not present

## 2024-07-23 DIAGNOSIS — F419 Anxiety disorder, unspecified: Secondary | ICD-10-CM | POA: Diagnosis not present

## 2024-07-23 DIAGNOSIS — R001 Bradycardia, unspecified: Secondary | ICD-10-CM | POA: Diagnosis not present

## 2024-07-23 DIAGNOSIS — I482 Chronic atrial fibrillation, unspecified: Secondary | ICD-10-CM | POA: Diagnosis not present

## 2024-07-23 DIAGNOSIS — F32A Depression, unspecified: Secondary | ICD-10-CM | POA: Diagnosis not present

## 2024-07-23 DIAGNOSIS — I272 Pulmonary hypertension, unspecified: Secondary | ICD-10-CM | POA: Diagnosis not present

## 2024-07-23 DIAGNOSIS — G629 Polyneuropathy, unspecified: Secondary | ICD-10-CM | POA: Diagnosis not present

## 2024-07-23 DIAGNOSIS — I130 Hypertensive heart and chronic kidney disease with heart failure and stage 1 through stage 4 chronic kidney disease, or unspecified chronic kidney disease: Secondary | ICD-10-CM | POA: Diagnosis not present

## 2024-07-23 DIAGNOSIS — I2510 Atherosclerotic heart disease of native coronary artery without angina pectoris: Secondary | ICD-10-CM | POA: Diagnosis not present

## 2024-07-23 DIAGNOSIS — N390 Urinary tract infection, site not specified: Secondary | ICD-10-CM | POA: Diagnosis not present

## 2024-07-23 DIAGNOSIS — N183 Chronic kidney disease, stage 3 unspecified: Secondary | ICD-10-CM | POA: Diagnosis not present

## 2024-07-23 DIAGNOSIS — Z9181 History of falling: Secondary | ICD-10-CM | POA: Diagnosis not present

## 2024-07-23 DIAGNOSIS — K5900 Constipation, unspecified: Secondary | ICD-10-CM | POA: Diagnosis not present

## 2024-07-23 DIAGNOSIS — D509 Iron deficiency anemia, unspecified: Secondary | ICD-10-CM | POA: Diagnosis not present

## 2024-07-23 DIAGNOSIS — Z5919 Other inadequate housing: Secondary | ICD-10-CM | POA: Diagnosis not present

## 2024-07-23 DIAGNOSIS — I10 Essential (primary) hypertension: Secondary | ICD-10-CM | POA: Diagnosis not present

## 2024-07-23 DIAGNOSIS — I5042 Chronic combined systolic (congestive) and diastolic (congestive) heart failure: Secondary | ICD-10-CM | POA: Diagnosis not present

## 2024-07-23 DIAGNOSIS — Z7901 Long term (current) use of anticoagulants: Secondary | ICD-10-CM | POA: Diagnosis not present

## 2024-07-27 DIAGNOSIS — G629 Polyneuropathy, unspecified: Secondary | ICD-10-CM | POA: Diagnosis not present

## 2024-07-27 DIAGNOSIS — I2510 Atherosclerotic heart disease of native coronary artery without angina pectoris: Secondary | ICD-10-CM | POA: Diagnosis not present

## 2024-07-27 DIAGNOSIS — N390 Urinary tract infection, site not specified: Secondary | ICD-10-CM | POA: Diagnosis not present

## 2024-07-27 DIAGNOSIS — I130 Hypertensive heart and chronic kidney disease with heart failure and stage 1 through stage 4 chronic kidney disease, or unspecified chronic kidney disease: Secondary | ICD-10-CM | POA: Diagnosis not present

## 2024-07-27 DIAGNOSIS — F32A Depression, unspecified: Secondary | ICD-10-CM | POA: Diagnosis not present

## 2024-07-27 DIAGNOSIS — I10 Essential (primary) hypertension: Secondary | ICD-10-CM | POA: Diagnosis not present

## 2024-07-27 DIAGNOSIS — I482 Chronic atrial fibrillation, unspecified: Secondary | ICD-10-CM | POA: Diagnosis not present

## 2024-07-27 DIAGNOSIS — Z5919 Other inadequate housing: Secondary | ICD-10-CM | POA: Diagnosis not present

## 2024-07-27 DIAGNOSIS — F419 Anxiety disorder, unspecified: Secondary | ICD-10-CM | POA: Diagnosis not present

## 2024-07-27 DIAGNOSIS — Z9181 History of falling: Secondary | ICD-10-CM | POA: Diagnosis not present

## 2024-07-27 DIAGNOSIS — I5042 Chronic combined systolic (congestive) and diastolic (congestive) heart failure: Secondary | ICD-10-CM | POA: Diagnosis not present

## 2024-07-27 DIAGNOSIS — D509 Iron deficiency anemia, unspecified: Secondary | ICD-10-CM | POA: Diagnosis not present

## 2024-07-27 DIAGNOSIS — N183 Chronic kidney disease, stage 3 unspecified: Secondary | ICD-10-CM | POA: Diagnosis not present

## 2024-07-27 DIAGNOSIS — K5900 Constipation, unspecified: Secondary | ICD-10-CM | POA: Diagnosis not present

## 2024-07-27 DIAGNOSIS — I272 Pulmonary hypertension, unspecified: Secondary | ICD-10-CM | POA: Diagnosis not present

## 2024-07-27 DIAGNOSIS — R001 Bradycardia, unspecified: Secondary | ICD-10-CM | POA: Diagnosis not present

## 2024-07-27 DIAGNOSIS — Z7901 Long term (current) use of anticoagulants: Secondary | ICD-10-CM | POA: Diagnosis not present

## 2024-08-05 DIAGNOSIS — Z5919 Other inadequate housing: Secondary | ICD-10-CM | POA: Diagnosis not present

## 2024-08-05 DIAGNOSIS — Z7901 Long term (current) use of anticoagulants: Secondary | ICD-10-CM | POA: Diagnosis not present

## 2024-08-05 DIAGNOSIS — F32A Depression, unspecified: Secondary | ICD-10-CM | POA: Diagnosis not present

## 2024-08-05 DIAGNOSIS — N390 Urinary tract infection, site not specified: Secondary | ICD-10-CM | POA: Diagnosis not present

## 2024-08-05 DIAGNOSIS — I482 Chronic atrial fibrillation, unspecified: Secondary | ICD-10-CM | POA: Diagnosis not present

## 2024-08-05 DIAGNOSIS — R001 Bradycardia, unspecified: Secondary | ICD-10-CM | POA: Diagnosis not present

## 2024-08-05 DIAGNOSIS — I5042 Chronic combined systolic (congestive) and diastolic (congestive) heart failure: Secondary | ICD-10-CM | POA: Diagnosis not present

## 2024-08-05 DIAGNOSIS — G629 Polyneuropathy, unspecified: Secondary | ICD-10-CM | POA: Diagnosis not present

## 2024-08-05 DIAGNOSIS — I130 Hypertensive heart and chronic kidney disease with heart failure and stage 1 through stage 4 chronic kidney disease, or unspecified chronic kidney disease: Secondary | ICD-10-CM | POA: Diagnosis not present

## 2024-08-05 DIAGNOSIS — Z9181 History of falling: Secondary | ICD-10-CM | POA: Diagnosis not present

## 2024-08-05 DIAGNOSIS — I10 Essential (primary) hypertension: Secondary | ICD-10-CM | POA: Diagnosis not present

## 2024-08-05 DIAGNOSIS — I272 Pulmonary hypertension, unspecified: Secondary | ICD-10-CM | POA: Diagnosis not present

## 2024-08-05 DIAGNOSIS — I2510 Atherosclerotic heart disease of native coronary artery without angina pectoris: Secondary | ICD-10-CM | POA: Diagnosis not present

## 2024-08-05 DIAGNOSIS — N183 Chronic kidney disease, stage 3 unspecified: Secondary | ICD-10-CM | POA: Diagnosis not present

## 2024-08-05 DIAGNOSIS — D509 Iron deficiency anemia, unspecified: Secondary | ICD-10-CM | POA: Diagnosis not present

## 2024-08-05 DIAGNOSIS — F419 Anxiety disorder, unspecified: Secondary | ICD-10-CM | POA: Diagnosis not present

## 2024-08-05 DIAGNOSIS — K5900 Constipation, unspecified: Secondary | ICD-10-CM | POA: Diagnosis not present

## 2024-08-10 DIAGNOSIS — I130 Hypertensive heart and chronic kidney disease with heart failure and stage 1 through stage 4 chronic kidney disease, or unspecified chronic kidney disease: Secondary | ICD-10-CM | POA: Diagnosis not present

## 2024-08-10 DIAGNOSIS — N183 Chronic kidney disease, stage 3 unspecified: Secondary | ICD-10-CM | POA: Diagnosis not present

## 2024-08-10 DIAGNOSIS — I272 Pulmonary hypertension, unspecified: Secondary | ICD-10-CM | POA: Diagnosis not present

## 2024-08-10 DIAGNOSIS — G629 Polyneuropathy, unspecified: Secondary | ICD-10-CM | POA: Diagnosis not present

## 2024-08-10 DIAGNOSIS — I2510 Atherosclerotic heart disease of native coronary artery without angina pectoris: Secondary | ICD-10-CM | POA: Diagnosis not present

## 2024-08-10 DIAGNOSIS — I482 Chronic atrial fibrillation, unspecified: Secondary | ICD-10-CM | POA: Diagnosis not present

## 2024-08-10 DIAGNOSIS — Z5919 Other inadequate housing: Secondary | ICD-10-CM | POA: Diagnosis not present

## 2024-08-10 DIAGNOSIS — F419 Anxiety disorder, unspecified: Secondary | ICD-10-CM | POA: Diagnosis not present

## 2024-08-10 DIAGNOSIS — N390 Urinary tract infection, site not specified: Secondary | ICD-10-CM | POA: Diagnosis not present

## 2024-08-10 DIAGNOSIS — F32A Depression, unspecified: Secondary | ICD-10-CM | POA: Diagnosis not present

## 2024-08-10 DIAGNOSIS — K5900 Constipation, unspecified: Secondary | ICD-10-CM | POA: Diagnosis not present

## 2024-08-10 DIAGNOSIS — D509 Iron deficiency anemia, unspecified: Secondary | ICD-10-CM | POA: Diagnosis not present

## 2024-08-10 DIAGNOSIS — I5042 Chronic combined systolic (congestive) and diastolic (congestive) heart failure: Secondary | ICD-10-CM | POA: Diagnosis not present

## 2024-08-10 DIAGNOSIS — R001 Bradycardia, unspecified: Secondary | ICD-10-CM | POA: Diagnosis not present

## 2024-08-10 DIAGNOSIS — Z7901 Long term (current) use of anticoagulants: Secondary | ICD-10-CM | POA: Diagnosis not present

## 2024-08-10 DIAGNOSIS — Z9181 History of falling: Secondary | ICD-10-CM | POA: Diagnosis not present

## 2024-08-10 DIAGNOSIS — I10 Essential (primary) hypertension: Secondary | ICD-10-CM | POA: Diagnosis not present

## 2024-08-12 ENCOUNTER — Ambulatory Visit: Admitting: Nurse Practitioner

## 2024-08-13 DIAGNOSIS — L600 Ingrowing nail: Secondary | ICD-10-CM | POA: Diagnosis not present

## 2024-08-13 DIAGNOSIS — M79674 Pain in right toe(s): Secondary | ICD-10-CM | POA: Diagnosis not present

## 2024-08-13 DIAGNOSIS — B351 Tinea unguium: Secondary | ICD-10-CM | POA: Diagnosis not present

## 2024-08-13 DIAGNOSIS — M79675 Pain in left toe(s): Secondary | ICD-10-CM | POA: Diagnosis not present

## 2024-08-23 ENCOUNTER — Other Ambulatory Visit: Payer: Self-pay | Admitting: Cardiology

## 2024-08-25 NOTE — Telephone Encounter (Signed)
 90 day fill is fine

## 2024-09-07 ENCOUNTER — Ambulatory Visit: Admitting: Medical

## 2024-09-16 ENCOUNTER — Telehealth: Payer: Self-pay | Admitting: Emergency Medicine

## 2024-09-16 NOTE — Telephone Encounter (Signed)
 Called and spoke with the patient to inquire if he was ok with switching his upcoming appointment from Barnie Hila, NP at 1510 to Tylene Lunch, NP at 1445.  Patient verbalized that he was ok with this switch.  Appointment with Barnie cancelled and patient put on Sheri's schedule.

## 2024-09-16 NOTE — Telephone Encounter (Signed)
 Do we know why he is being seen? The chart prep notes are a little confusing

## 2024-09-16 NOTE — Telephone Encounter (Signed)
-----   Message from Barnie Hila, NP sent at 09/16/2024 10:20 AM EST ----- Walterine Schuller,   This patient has seen Tylene the last 2 visits. Will you please see if we can switch him from my schedule on Friday to [facility] open [facility] on the same day for continuity?   Thank you!  DW

## 2024-09-18 ENCOUNTER — Ambulatory Visit: Admitting: Student

## 2024-09-18 ENCOUNTER — Ambulatory Visit: Attending: Cardiology | Admitting: Cardiology

## 2024-09-18 NOTE — Progress Notes (Unsigned)
 " Cardiology Office Note   Date:  09/18/2024  ID:  Treyshaun Keatts, DOB 1941-01-31, MRN [id number] PCP: Auston Reyes BIRCH, MD  [facility] Providers Cardiologist:  Evalene Lunger, MD Cardiology APP:  Vivienne Lonni Ingle, NP { Click to update primary MD,subspecialty MD or APP then REFRESH:1}    History of Present Illness Jordan Perkins is a 84 y.o. male with past medical history of coronary artery disease status post bypass surgery (2005, [facility]), recurrent stents (2005), hypertension, hyperlipidemia, chronic atrial fibrillation on chronic rivaroxaban , chronic HFpEF, CKD stage III, pulmonary hypertension, chronic anemia, who is here today for follow-up.   Prior left heart catheterization 04/2024 [facility] treatment vessel CAD with the patient subsequently underwent three-vessel CABG with subsequent PCI in 06/2004 with details unclear.  Most recent ischemic evaluation noted to be in 2014 with no significant ischemia.  Echo from 8/207 showed hyperdynamic LV systolic function with an EF of 70 to 75%, trivial MR, mildly dilated left atrium.  Echocardiogram in 02/2019 showed hyperdynamic LV systolic function with EF greater than 65%, evidence of right ventricular volume and pressure overload with an estimated PASP 69.8 mmHg, moderate reduced LV systolic function with moderately enlarged RV cavity size and mildly increased RV wall thickness, degenerative mitral valve with moderate mitral annular calcification mild to moderate TR.  Echocardiogram 03/2020 showed EF 60 to 65%, mild biatrial enlargement, mild MR, trivial AI, mild aortic valve sclerosis without evidence of stenosis.  Outpatient cardiac monitoring 04/2020 showed 100% A-fib burden with NSVT with the longest and fastest interval lasting 7 beats.  He was admitted 7/14 through 04/06/2020 [facility] due to anasarca, acute on chronic diastolic heart failure, bradycardia.  His bradycardia was asymptomatic.  His metoprolol  was discontinued.  His  hydrochlorothiazide  was discontinued.  His Xarelto  was stopped due to FOBT and falls.  He had recurrent falls with generalized weakness.  He was discharged on Lasix  40 mg daily, lisinopril  40 mg daily, spironolactone  25 mg daily.  His weight on date of discharge was 75.8 kg.  Labs 04/04/2020 with hemoglobin 7.6, creatinine 1.06, GFR greater than 60.   He was seen in clinic 04/29/2020 with weight gain of 9 pounds over 2 weeks.  He was recommended to change his Lasix  dose however repeat blood work showed sodium of 119 he was recommended for evaluation in the ED.  Admitted 04/30/20 - 05/05/20 treated with 3% saline and then tolvaptan  x2.  Home health was recommended.  Palliative care continues to follow.  He was discharged on lisinopril  40 mg daily, Lasix  40 mg daily, spironolactone  25 mg daily.  Sodium on day of discharge 133.  Repeat echocardiogram done in May 2022 revealed LVEF 55 to 60%, no RWMA, G2 DD, mildly reduced RV systolic function moderately enlarged RV cavity size, severely dilated left atrium, mild MR, mild aortic valve sclerosis without evidence of stenosis.  He was then recommended for right heart catheterization and to follow-up with advanced heart failure clinic.   He was seen in clinic 10/23/2023 stating been doing fairly well from a cardiac perspective with continued complaints neuropathy.  States he been out of Xarelto  for approximately a week.  Continued on rivaroxaban  with no other changes made to his regular medication regimen.  He was last seen in clinic 02/20/2024 stating overall he was doing well from a cardiac perspective.  Denied chest pain, shortness breath or dyspnea on exertion.  Had been compliant with his current medication regimen and denied any recent hospitalizations.  There were no changes  made to his medication regimen and no further testing that was ordered.   He returns to clinic today  ROS: 10 point review of system has been reviewed and considered negative exception was  been listed in the HPI  Studies Reviewed      RHC performed June 09, 2021 Severe pulmonary hypertension (mean PAP 63 mmHg, PVR 5.9 WU). Moderately elevated left and right heart filling pressures (RAP 15 mmHg, PCWP 30 mmHg). Normal Fick cardiac output/index.   Echo 01/2021  Right heart pressures estimated 100 mmHg on echo  1. Left ventricular ejection fraction, by estimation, is 55 to 60%. The  left ventricle has normal function. The left ventricle has no regional  wall motion abnormalities. Left ventricular diastolic parameters are  consistent with Grade II diastolic  dysfunction (pseudonormalization).   2. Right ventricular systolic function is mildly reduced. The right  ventricular size is moderately enlarged.   3. Left atrial size was severely dilated.   4. Right atrial size was moderately dilated.   5. The mitral valve is degenerative. Mild mitral valve regurgitation.   6. The aortic valve is tricuspid. Aortic valve regurgitation is not  visualized. Mild aortic valve sclerosis is present, with no evidence of  aortic valve stenosis.   7. The inferior vena cava is dilated in size with <50% respiratory  variability, suggesting right atrial pressure of 15 mmHg.    Echocardiogram July 2021  ejection fraction 60 to 65% Severely elevated right heart pressures   Zio: Atrial Fibrillation occurred continuously (100% burden), ranging from 32-104 bpm (avg of 56 bpm). 2 Ventricular Tachycardia runs occurred, the run with the fastest interval lasting 7 beats with a max rate of 171 bpm (avg 117 bpm);  the run with the fastest interval was also the longest.  Isolated VEs were occasional (2.6%, 28107), VE Couplets were rare (<1.0%, 393), and VE Triplets were rare (<1.0%, 14). Ventricular Bigeminy and Trigeminy were present.  Risk Assessment/Calculations  CHA2DS2-VASc Score = 4  {Confirm score is correct.  If not, click here to update score.  REFRESH note.  :1} This indicates a 4.8%  annual risk of stroke. The patient's score is based upon: CHF History: 0 HTN History: 1 Diabetes History: 0 Stroke History: 0 Vascular Disease History: 1 Age Score: 2 Gender Score: 0   {This patient has a significant risk of stroke if diagnosed with atrial fibrillation.  Please consider VKA or DOAC agent for anticoagulation if the bleeding risk is acceptable.   You can also use the SmartPhrase .[email or web address] for documentation.   :[id number]} No BP recorded.  {Refresh Note OR Click here to enter BP  :1}***       Physical Exam VS:  There were no vitals taken for this visit.       Wt Readings from Last 3 Encounters:  02/26/24 170 lb 8 oz (77.3 kg)  02/20/24 171 lb (77.6 kg)  10/16/23 179 lb 9.6 oz (81.5 kg)    GEN: Well nourished, well developed in no acute distress NECK: No JVD; No carotid bruits CARDIAC: ***RRR, no murmurs, rubs, gallops RESPIRATORY:  Clear to auscultation without rales, wheezing or rhonchi  ABDOMEN: Soft, non-tender, non-distended EXTREMITIES:  No edema; No deformity   ASSESSMENT AND PLAN Permanent atrial fibrillation Chronic HFpEF/pulmonary hypertension Coronary artery disease status post CABG x 3 A1c Primary hypertension Mixed hyperlipidemia Recent hospitalization for CVA    {Are you ordering a CV Procedure (e.g. stress test, cath, DCCV, TEE, etc)?   Press F2        :  [id number]}  Dispo: ***  Signed, Sharleen Szczesny, NP   "
